# Patient Record
Sex: Female | Born: 1985 | Race: White | Hispanic: No | Smoking: Current every day smoker
Health system: Southern US, Community
[De-identification: ages and names within clinical notes are randomized; demographics above are authoritative.]

## PROBLEM LIST (undated history)

## (undated) DIAGNOSIS — I10 Essential (primary) hypertension: Secondary | ICD-10-CM

## (undated) DIAGNOSIS — J45909 Unspecified asthma, uncomplicated: Secondary | ICD-10-CM

## (undated) DIAGNOSIS — E669 Obesity, unspecified: Secondary | ICD-10-CM

## (undated) DIAGNOSIS — G8929 Other chronic pain: Secondary | ICD-10-CM

## (undated) DIAGNOSIS — N809 Endometriosis, unspecified: Secondary | ICD-10-CM

## (undated) DIAGNOSIS — E1122 Type 2 diabetes mellitus with diabetic chronic kidney disease: Secondary | ICD-10-CM

## (undated) DIAGNOSIS — N83209 Unspecified ovarian cyst, unspecified side: Secondary | ICD-10-CM

## (undated) DIAGNOSIS — F39 Unspecified mood [affective] disorder: Secondary | ICD-10-CM

## (undated) DIAGNOSIS — E1169 Type 2 diabetes mellitus with other specified complication: Secondary | ICD-10-CM

## (undated) DIAGNOSIS — M1990 Unspecified osteoarthritis, unspecified site: Secondary | ICD-10-CM

## (undated) DIAGNOSIS — F419 Anxiety disorder, unspecified: Secondary | ICD-10-CM

## (undated) DIAGNOSIS — R51 Headache: Secondary | ICD-10-CM

## (undated) DIAGNOSIS — F32A Depression, unspecified: Secondary | ICD-10-CM

## (undated) DIAGNOSIS — E785 Hyperlipidemia, unspecified: Secondary | ICD-10-CM

## (undated) DIAGNOSIS — R109 Unspecified abdominal pain: Secondary | ICD-10-CM

## (undated) DIAGNOSIS — F329 Major depressive disorder, single episode, unspecified: Secondary | ICD-10-CM

## (undated) DIAGNOSIS — R519 Headache, unspecified: Secondary | ICD-10-CM

## (undated) DIAGNOSIS — Z720 Tobacco use: Secondary | ICD-10-CM

## (undated) DIAGNOSIS — D497 Neoplasm of unspecified behavior of endocrine glands and other parts of nervous system: Secondary | ICD-10-CM

## (undated) HISTORY — DX: Type 2 diabetes mellitus with other specified complication: E11.69

## (undated) HISTORY — DX: Unspecified asthma, uncomplicated: J45.909

## (undated) HISTORY — PX: HERNIA REPAIR: SHX51

## (undated) HISTORY — DX: Obesity, unspecified: E66.9

## (undated) HISTORY — DX: Unspecified osteoarthritis, unspecified site: M19.90

## (undated) HISTORY — PX: TUBAL LIGATION: SHX77

## (undated) HISTORY — PX: KNEE ARTHROSCOPY: SUR90

## (undated) HISTORY — DX: Essential (primary) hypertension: I10

## (undated) HISTORY — DX: Hyperlipidemia, unspecified: E78.5

---
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
## 2012-02-22 ENCOUNTER — Emergency Department (HOSPITAL_COMMUNITY)
Admission: EM | Admit: 2012-02-22 | Discharge: 2012-02-23 | Disposition: A | Discharge status: Home / Self Care | Payer: Self-pay | Attending: Emergency Medicine | Admitting: Emergency Medicine

## 2012-02-22 ENCOUNTER — Encounter (HOSPITAL_COMMUNITY): Payer: Self-pay | Admitting: Emergency Medicine

## 2012-02-22 DIAGNOSIS — Z9889 Other specified postprocedural states: Secondary | ICD-10-CM | POA: Insufficient documentation

## 2012-02-22 DIAGNOSIS — R10819 Abdominal tenderness, unspecified site: Secondary | ICD-10-CM | POA: Insufficient documentation

## 2012-02-22 DIAGNOSIS — R109 Unspecified abdominal pain: Secondary | ICD-10-CM | POA: Insufficient documentation

## 2012-02-22 NOTE — ED Notes (Signed)
Patient states she had a "clamp tubal done on both fallopian tubes and I'm not supposed to lift anything heavy. I was doing heavy lifting today and felt a pop in my lower right stomach." Complaining of severe lower right abdominal pain starting today.

## 2012-02-22 NOTE — ED Provider Notes (Signed)
History   This chart was scribed for Shelda Jakes, MD scribed by Magnus Sinning. The patient was seen in room APA19/APA19 seen at 23:46    CSN: [id number]  Arrival date & time 02/22/12  2239   First MD Initiated Contact with Patient 02/22/12 2322      Chief Complaint  Patient presents with  . Abdominal Pain    (Consider location/radiation/quality/duration/timing/severity/associated sxs/prior treatment) HPI Patricia Stanton is a 26 y.o. female who presents to the Emergency Department complaining of sharp non radiating RLQ pain onset today. Pt says she was not supposed to do any heavy lifting because of h/o internal (inside fallopian tube) hernia. She says today she was doing heavy lifting  around noon today when she heard a "pop." States she has had pain in same spot chronically, but never to this severity, which she describes as a 10/10 on a pain scale. Hx of tubal ligation done in 2010 PCP: None Ob-Gyn: Dr. Ralph Dowdy at [facility] History reviewed. No pertinent past medical history.  Past Surgical History  Procedure Date  . Femur fracture surgery     History reviewed. No pertinent family history.  History  Substance Use Topics  . Smoking status: Current Everyday Smoker -- 0.5 packs/day  . Smokeless tobacco: Not on file  . Alcohol Use: No    Review of Systems  Constitutional: Negative for fever.  HENT: Negative for congestion and sore throat.   Eyes: Negative for visual disturbance.  Respiratory: Negative for cough and shortness of breath.   Cardiovascular: Negative for chest pain.  Gastrointestinal: Negative for nausea, vomiting and diarrhea.  Genitourinary: Negative for dysuria.  Skin: Negative for rash.  Neurological: Negative for headaches.  All other systems reviewed and are negative.    Allergies  Review of patient's allergies indicates no known allergies.  Home Medications   Current Outpatient Rx  Name Route Sig Dispense Refill  . HYDROCODONE-ACETAMINOPHEN  5-325 MG PO TABS Oral Take 1-2 tablets by mouth every 6 (six) hours as needed for pain. 14 tablet 0  . NAPROXEN 500 MG PO TABS Oral Take 1 tablet (500 mg total) by mouth 2 (two) times daily. 14 tablet 0    BP 131/77  Pulse 100  Temp(Src) 98 F (36.7 C) (Oral)  Resp 14  Ht 5\' 5"  (1.651 m)  Wt 219 lb (99.338 kg)  BMI 36.44 kg/m2  SpO2 99%  LMP 12/23/2011  Physical Exam  Nursing note and vitals reviewed. Constitutional: She is oriented to person, place, and time. She appears well-developed and well-nourished. No distress.  HENT:  Head: Normocephalic and atraumatic.  Mouth/Throat: Oropharynx is clear and moist.  Eyes: EOM are normal. Pupils are equal, round, and reactive to light.  Neck: Neck supple. No tracheal deviation present.  Cardiovascular: Normal rate and regular rhythm.   No murmur heard. Pulmonary/Chest: Effort normal. No respiratory distress.       Lungs clear  Abdominal: Soft. She exhibits no distension. There is tenderness.       Tender in RLQ and LLQ  Musculoskeletal: Normal range of motion. She exhibits no edema.  Neurological: She is alert and oriented to person, place, and time. No sensory deficit.  Skin: Skin is warm and dry.  Psychiatric: She has a normal mood and affect. Her behavior is normal.    ED Course  Procedures (including critical care time) DIAGNOSTIC STUDIES: Oxygen Saturation is 99% on room air, normal by my interpretation.    COORDINATION OF CARE:  Labs Reviewed  CBC -  Abnormal; Notable for the following:    WBC 11.7 (*)    All other components within normal limits  COMPREHENSIVE METABOLIC PANEL - Abnormal; Notable for the following:    Sodium 133 (*)    Total Bilirubin 0.2 (*)    All other components within normal limits  URINALYSIS, ROUTINE W REFLEX MICROSCOPIC - Abnormal; Notable for the following:    Specific Gravity, Urine >1.030 (*)    All other components within normal limits  DIFFERENTIAL  LIPASE, BLOOD   Ct Abdomen Pelvis W  Contrast  02/23/2012  *RADIOLOGY REPORT*  Clinical Data: Right lower quadrant abdominal pain  CT ABDOMEN AND PELVIS WITH CONTRAST  Technique:  Multidetector CT imaging of the abdomen and pelvis was performed following the standard protocol during bolus administration of intravenous contrast.  Contrast: OMNIPAQUE IOHEXOL 300 MG/ML  SOLN  Comparison: None.  Findings: 3 mm right lower lobe nodule on image two.  Lung bases are otherwise clear.  Heart size within normal limits.  No pleural or pericardial effusion.  Unremarkable liver, biliary system, spleen, pancreas, adrenal glands.  Symmetric renal enhancement.  No hydronephrosis or hydroureter.  No bowel obstruction.  No CT evidence for colitis.  Normal appendix.  No free intraperitoneal air or fluid.  No lymphadenopathy.  Normal caliber vasculature.  Decompressed bladder limits evaluation.  Unremarkable CT appearance to the uterus and adnexa. Tubal ligation clips noted.  Multilevel degenerative changes.  No acute osseous finding.  Within the anterior subcutaneous fat of the right lower quadrant/ groin, there is a 1.9 x 2.3 cm soft tissue versus complex fluid attenuation.  The collection/mass is in close proximity to a superficial vein (series 2 image 80).  IMPRESSION: No acute intra-abdominal process.  Normal appendix.  1.9 x 2.3 cm complex fluid collection versus soft tissue density within the anterior subcutaneous fat overlying the right lower quadrant/right groin.  This is a nonspecific finding with a differential that includes endometrial implant/endometriosis if this is along the course of prior surgery, scar tissue, sequelae of prior hematoma, or soft tissue tumor (though felt to be less likely). If palpable, an ultrasound could be considered to attempt to better characterize.  Alternatively, consider MRI follow-up.  Original Report Authenticated By: Waneta Martins, M.D.   Results for orders placed during the hospital encounter of 02/22/12  CBC       Component Value Range   WBC 11.7 (*) 4.0 - 10.5 (K/uL)   RBC 4.92  3.87 - 5.11 (MIL/uL)   Hemoglobin 13.4  12.0 - 15.0 (g/dL)   HCT 08.6  57.8 - 46.9 (%)   MCV 80.5  78.0 - 100.0 (fL)   MCH 27.2  26.0 - 34.0 (pg)   MCHC 33.8  30.0 - 36.0 (g/dL)   RDW 62.9  52.8 - 41.3 (%)   Platelets 269  150 - 400 (K/uL)  DIFFERENTIAL      Component Value Range   Neutrophils Relative 61  43 - 77 (%)   Neutro Abs 7.2  1.7 - 7.7 (K/uL)   Lymphocytes Relative 29  12 - 46 (%)   Lymphs Abs 3.4  0.7 - 4.0 (K/uL)   Monocytes Relative 8  3 - 12 (%)   Monocytes Absolute 1.0  0.1 - 1.0 (K/uL)   Eosinophils Relative 1  0 - 5 (%)   Eosinophils Absolute 0.1  0.0 - 0.7 (K/uL)   Basophils Relative 0  0 - 1 (%)   Basophils Absolute 0.0  0.0 - 0.1 (K/uL)  COMPREHENSIVE METABOLIC PANEL      Component Value Range   Sodium 133 (*) 135 - 145 (mEq/L)   Potassium 4.2  3.5 - 5.1 (mEq/L)   Chloride 100  96 - 112 (mEq/L)   CO2 22  19 - 32 (mEq/L)   Glucose, Bld 88  70 - 99 (mg/dL)   BUN 9  6 - 23 (mg/dL)   Creatinine, Ser 1.61  0.50 - 1.10 (mg/dL)   Calcium 9.6  8.4 - 09.6 (mg/dL)   Total Protein 7.6  6.0 - 8.3 (g/dL)   Albumin 3.6  3.5 - 5.2 (g/dL)   AST 22  0 - 37 (U/L)   ALT 27  0 - 35 (U/L)   Alkaline Phosphatase 83  39 - 117 (U/L)   Total Bilirubin 0.2 (*) 0.3 - 1.2 (mg/dL)   GFR calc non Af Amer >90  >90 (mL/min)   GFR calc Af Amer >90  >90 (mL/min)  LIPASE, BLOOD      Component Value Range   Lipase 48  11 - 59 (U/L)  URINALYSIS, ROUTINE W REFLEX MICROSCOPIC      Component Value Range   Color, Urine YELLOW  YELLOW    APPearance CLEAR  CLEAR    Specific Gravity, Urine >1.030 (*) 1.005 - 1.030    pH 6.0  5.0 - 8.0    Glucose, UA NEGATIVE  NEGATIVE (mg/dL)   Hgb urine dipstick NEGATIVE  NEGATIVE    Bilirubin Urine NEGATIVE  NEGATIVE    Ketones, ur NEGATIVE  NEGATIVE (mg/dL)   Protein, ur NEGATIVE  NEGATIVE (mg/dL)   Urobilinogen, UA 0.2  0.0 - 1.0 (mg/dL)   Nitrite NEGATIVE  NEGATIVE     Leukocytes, UA NEGATIVE  NEGATIVE      1. Abdominal pain       MDM  CT consistent with right lower quadrant pelvic area soft tissue mass this may be of the internal hernia talking about no evidence of bowel stuck in her bowel obstruction also a little bit of fluid collection there could be endometriosis recommend pelvic ultrasound patient prefers to have that done with her GYN doctor which will be done later this week patient will return for any new or worse symptoms. Patient once again did not want to have the pelvic ultrasound done here or scheduled here for later today. Patient's abdomen is not consistent with acute surgical abdomen patient is nontoxic in no acute distress.  I personally performed the services described in this documentation, which was scribed in my presence. The recorded information has been reviewed and considered.          Shelda Jakes, MD 02/23/12 [phone]

## 2012-02-22 NOTE — ED Notes (Signed)
Pt reporting pain in RLQ.  States that she knows it's her fallopian tube.  Pt reports having tubal ligation in 2010 and following surgery developed a hernia in her fallopian tube and severe pain.  Reports that she is feeling same symptoms now.

## 2012-02-23 ENCOUNTER — Emergency Department (HOSPITAL_COMMUNITY): Payer: Self-pay

## 2012-02-23 LAB — COMPREHENSIVE METABOLIC PANEL
ALT: 27 U/L (ref 0–35)
Albumin: 3.6 g/dL (ref 3.5–5.2)
Alkaline Phosphatase: 83 U/L (ref 39–117)
Potassium: 4.2 mEq/L (ref 3.5–5.1)
Sodium: 133 mEq/L — ABNORMAL LOW (ref 135–145)
Total Protein: 7.6 g/dL (ref 6.0–8.3)

## 2012-02-23 LAB — URINALYSIS, ROUTINE W REFLEX MICROSCOPIC
Bilirubin Urine: NEGATIVE
Hgb urine dipstick: NEGATIVE
Nitrite: NEGATIVE
Specific Gravity, Urine: >1.03 — ABNORMAL HIGH (ref 1.005–1.030)
pH: 6 (ref 5.0–8.0)

## 2012-02-23 LAB — CBC
MCH: 27.2 pg (ref 26.0–34.0)
MCHC: 33.8 g/dL (ref 30.0–36.0)
Platelets: 269 10*3/uL (ref 150–400)
RBC: 4.92 MIL/uL (ref 3.87–5.11)
RDW: 14.8 % (ref 11.5–15.5)

## 2012-02-23 LAB — DIFFERENTIAL
Basophils Absolute: 0 10*3/uL (ref 0.0–0.1)
Basophils Relative: 0 % (ref 0–1)
Eosinophils Absolute: 0.1 10*3/uL (ref 0.0–0.7)
Lymphs Abs: 3.4 10*3/uL (ref 0.7–4.0)
Neutrophils Relative %: 61 % (ref 43–77)

## 2012-02-23 MED ORDER — ACETAMINOPHEN 500 MG PO TABS
1000.0000 mg | ORAL_TABLET | Freq: Once | ORAL | Status: AC
  Administered 2012-02-23: 1000 mg via ORAL

## 2012-02-23 MED ORDER — HYDROMORPHONE HCL PF 1 MG/ML IJ SOLN: 1.0000 mg | Freq: Once | INTRAMUSCULAR | Status: DC

## 2012-02-23 MED ORDER — SODIUM CHLORIDE 0.9 % IV BOLUS (SEPSIS)
250.0000 mL | Freq: Once | INTRAVENOUS | Status: AC
  Administered 2012-02-23: 250 mL via INTRAVENOUS

## 2012-02-23 MED ORDER — ACETAMINOPHEN 500 MG PO TABS
ORAL_TABLET | ORAL | Status: AC
  Filled 2012-02-23: qty 2

## 2012-02-23 MED ORDER — NAPROXEN 500 MG PO TABS: 500.0000 mg | ORAL_TABLET | Freq: Two times a day (BID) | ORAL | Status: AC

## 2012-02-23 MED ORDER — SODIUM CHLORIDE 0.9 % IV SOLN: INTRAVENOUS | Status: DC

## 2012-02-23 MED ORDER — IOHEXOL 300 MG/ML  SOLN
100.0000 mL | Freq: Once | INTRAMUSCULAR | Status: AC | PRN
  Administered 2012-02-23: 100 mL via INTRAVENOUS

## 2012-02-23 MED ORDER — HYDROCODONE-ACETAMINOPHEN 5-325 MG PO TABS: 1.0000 | ORAL_TABLET | Freq: Four times a day (QID) | ORAL | Status: AC | PRN

## 2012-02-23 MED ORDER — ONDANSETRON HCL 4 MG/2ML IJ SOLN
4.0000 mg | Freq: Once | INTRAMUSCULAR | Status: DC
  Filled 2012-02-23: qty 2

## 2012-02-23 NOTE — ED Notes (Signed)
Pt did not to take narcotic pain medication at this time.  Pt also declined zofran at present.

## 2012-02-23 NOTE — ED Notes (Signed)
Patient transported to CT 

## 2012-02-23 NOTE — Discharge Instructions (Signed)
followup with your GYN Dr. In the next few days. Take pain medicine as directed. We are recommending a pelvic ultrasound to evaluate the fluid and soft tissue mass in the right lower corner of your abdomen pelvic area better we could arrange that for you later today but your preferences to followup with her GYN doctor which is fine. Return for new or worse symptoms.

## 2012-04-03 ENCOUNTER — Encounter (HOSPITAL_COMMUNITY): Payer: Self-pay

## 2012-04-03 ENCOUNTER — Emergency Department (HOSPITAL_COMMUNITY)
Admission: EM | Admit: 2012-04-03 | Discharge: 2012-04-04 | Disposition: A | Discharge status: Home / Self Care | Payer: Self-pay | Attending: Emergency Medicine | Admitting: Emergency Medicine

## 2012-04-03 DIAGNOSIS — G43909 Migraine, unspecified, not intractable, without status migrainosus: Secondary | ICD-10-CM | POA: Insufficient documentation

## 2012-04-03 DIAGNOSIS — F3289 Other specified depressive episodes: Secondary | ICD-10-CM | POA: Insufficient documentation

## 2012-04-03 DIAGNOSIS — F17200 Nicotine dependence, unspecified, uncomplicated: Secondary | ICD-10-CM | POA: Insufficient documentation

## 2012-04-03 DIAGNOSIS — F329 Major depressive disorder, single episode, unspecified: Secondary | ICD-10-CM | POA: Insufficient documentation

## 2012-04-03 DIAGNOSIS — R51 Headache: Secondary | ICD-10-CM

## 2012-04-03 HISTORY — DX: Depression, unspecified: F32.A

## 2012-04-03 HISTORY — DX: Major depressive disorder, single episode, unspecified: F32.9

## 2012-04-03 MED ORDER — ONDANSETRON 4 MG PO TBDP
4.0000 mg | ORAL_TABLET | Freq: Once | ORAL | Status: AC
  Administered 2012-04-03: 4 mg via ORAL
  Filled 2012-04-03: qty 1

## 2012-04-03 MED ORDER — HYDROMORPHONE HCL PF 1 MG/ML IJ SOLN
1.0000 mg | Freq: Once | INTRAMUSCULAR | Status: AC
  Administered 2012-04-03: 1 mg via INTRAMUSCULAR
  Filled 2012-04-03: qty 1

## 2012-04-03 MED ORDER — DIPHENHYDRAMINE HCL 25 MG PO CAPS
25.0000 mg | ORAL_CAPSULE | Freq: Once | ORAL | Status: AC
  Administered 2012-04-03: 25 mg via ORAL
  Filled 2012-04-03: qty 1

## 2012-04-03 MED ORDER — KETOROLAC TROMETHAMINE 30 MG/ML IJ SOLN
60.0000 mg | Freq: Once | INTRAMUSCULAR | Status: AC
  Administered 2012-04-03: 60 mg via INTRAMUSCULAR
  Filled 2012-04-03: qty 2

## 2012-04-03 NOTE — ED Provider Notes (Signed)
History   This chart was scribed for [employer]. Colon Branch, MD by Shari Heritage. The patient was seen in room APA11/APA11. Patient's care was started at 2258.     CSN: [id number]  Arrival date & time 04/03/12  2258   None     Chief Complaint  Patient presents with  . Headache    (Consider location/radiation/quality/duration/timing/severity/associated sxs/prior treatment) HPI Patricia Stanton is a 26 y.o. female who presents to the Emergency Department complaining of persistent, moderate to severe frontal HA onset 3 days ago with associated nausea, photophobia and visual disturbance including squiggles and spots. Patient denies vomiting. Patient has taken Excedrin Migraine, BC Migraine, Tylenol, and Motrin with no relief. Patient transported herself to the ED. Patient with h/o depression, femur fracture surgery and knee arthroscopy. Patient is a current everyday smoker (0.5 packs/day).  Past Medical History  Diagnosis Date  . Depression     Past Surgical History  Procedure Date  . Femur fracture surgery   . Knee arthroscopy     No family history on file.  History  Substance Use Topics  . Smoking status: Current Everyday Smoker -- 0.5 packs/day  . Smokeless tobacco: Not on file  . Alcohol Use: No    OB History    Grav Para Term Preterm Abortions TAB SAB Ect Mult Living                  Review of Systems  Constitutional: Negative for fever.       10 Systems reviewed and are negative for acute change except as noted in the HPI.  HENT: Negative for congestion.   Eyes: Positive for photophobia and visual disturbance. Negative for discharge and redness.  Respiratory: Negative for cough and shortness of breath.   Cardiovascular: Negative for chest pain.  Gastrointestinal: Positive for nausea. Negative for vomiting and abdominal pain.  Musculoskeletal: Negative for back pain.  Skin: Negative for rash.  Neurological: Positive for headaches. Negative for syncope and numbness.    Psychiatric/Behavioral:       No behavior change.  All other systems reviewed and are negative.    Allergies  Review of patient's allergies indicates no known allergies.  Home Medications   Current Outpatient Rx  Name Route Sig Dispense Refill  . ACETAMINOPHEN 325 MG PO TABS Oral Take 650 mg by mouth every 6 (six) hours as needed.    . ASPIRIN-ACETAMINOPHEN-CAFFEINE 250-250-65 MG PO TABS Oral Take 1 tablet by mouth every 6 (six) hours as needed.    Marland Kitchen CITALOPRAM HYDROBROMIDE 20 MG PO TABS Oral Take 20 mg by mouth daily.    . IBUPROFEN 400 MG PO TABS Oral Take 400 mg by mouth every 6 (six) hours as needed.    Marland Kitchen NAPROXEN 500 MG PO TABS Oral Take 1 tablet (500 mg total) by mouth 2 (two) times daily. 14 tablet 0    BP 142/90  Pulse 90  Temp 98.4 F (36.9 C) (Oral)  Resp 18  Ht 5\' 5"  (1.651 m)  Wt 220 lb (99.791 kg)  BMI 36.61 kg/m2  SpO2 97%  LMP 03/27/2012  Physical Exam  Constitutional: She is oriented to person, place, and time. No distress.  HENT:  Head: Normocephalic.  Eyes: Conjunctivae are normal. Pupils are equal, round, and reactive to light.  Cardiovascular: Normal rate and regular rhythm.   Pulmonary/Chest: Effort normal and breath sounds normal.  Abdominal: Soft. Bowel sounds are normal. There is no tenderness.  Musculoskeletal: Normal range of motion.  Neurological: She  is alert and oriented to person, place, and time. She has normal reflexes. Coordination normal.    ED Course  Procedures (including critical care time) DIAGNOSTIC STUDIES: Oxygen Saturation is 97% on room air, normal by my interpretation.    COORDINATION OF CARE: 11:13PM- Patient informed of current plan for treatment and evaluation and agrees with plan at this time.        MDM  Patient presents with migraine headache similar to previous headaches that she has had for 3 days not responding to OTC medicines. Given analgesic, benadryl, zofran, toradol with relief.  Pt feels improved  after observation and/or treatment in ED.Pt stable in ED with no significant deterioration in condition.The patient appears reasonably screened and/or stabilized for discharge and I doubt any other medical condition or other [facility] requiring further screening, evaluation, or treatment in the ED at this time prior to discharge.  I personally performed the services described in this documentation, which was scribed in my presence. The recorded information has been reviewed and considered.   MDM Reviewed: nursing note and vitals           Nicoletta Dress. Colon Branch, MD 04/04/12 4098

## 2012-04-03 NOTE — ED Notes (Signed)
Pt reports frontal headache x 3 days, taking otc meds without relief.

## 2012-04-04 MED ORDER — HYDROCODONE-ACETAMINOPHEN 5-325 MG PO TABS: 1.0000 | ORAL_TABLET | ORAL | Status: AC | PRN

## 2012-04-04 NOTE — ED Notes (Signed)
Discharge instructions reviewed with pt, questions answered. Pt verbalized understanding.  

## 2013-06-06 ENCOUNTER — Encounter (HOSPITAL_COMMUNITY): Payer: Self-pay | Admitting: Emergency Medicine

## 2013-06-06 ENCOUNTER — Emergency Department (HOSPITAL_COMMUNITY): Payer: PRIVATE HEALTH INSURANCE

## 2013-06-06 ENCOUNTER — Encounter (HOSPITAL_COMMUNITY): Payer: Self-pay | Admitting: *Deleted

## 2013-06-06 ENCOUNTER — Emergency Department (HOSPITAL_COMMUNITY)
Admission: EM | Admit: 2013-06-06 | Discharge: 2013-06-06 | Disposition: A | Discharge status: Home / Self Care | Payer: PRIVATE HEALTH INSURANCE | Attending: Emergency Medicine | Admitting: Emergency Medicine

## 2013-06-06 ENCOUNTER — Inpatient Hospital Stay (HOSPITAL_COMMUNITY)
Admission: EM | Admit: 2013-06-06 | Discharge: 2013-06-08 | DRG: 193 | Disposition: A | Discharge status: Home / Self Care | Payer: PRIVATE HEALTH INSURANCE | Attending: Internal Medicine | Admitting: Internal Medicine

## 2013-06-06 DIAGNOSIS — Z8659 Personal history of other mental and behavioral disorders: Secondary | ICD-10-CM | POA: Insufficient documentation

## 2013-06-06 DIAGNOSIS — E860 Dehydration: Secondary | ICD-10-CM | POA: Diagnosis present

## 2013-06-06 DIAGNOSIS — R0902 Hypoxemia: Secondary | ICD-10-CM

## 2013-06-06 DIAGNOSIS — Z792 Long term (current) use of antibiotics: Secondary | ICD-10-CM | POA: Insufficient documentation

## 2013-06-06 DIAGNOSIS — Z720 Tobacco use: Secondary | ICD-10-CM | POA: Diagnosis present

## 2013-06-06 DIAGNOSIS — J189 Pneumonia, unspecified organism: Secondary | ICD-10-CM

## 2013-06-06 DIAGNOSIS — R7309 Other abnormal glucose: Secondary | ICD-10-CM | POA: Diagnosis present

## 2013-06-06 DIAGNOSIS — Z23 Encounter for immunization: Secondary | ICD-10-CM

## 2013-06-06 DIAGNOSIS — E872 Acidosis, unspecified: Secondary | ICD-10-CM | POA: Diagnosis present

## 2013-06-06 DIAGNOSIS — Z6841 Body Mass Index (BMI) 40.0 and over, adult: Secondary | ICD-10-CM

## 2013-06-06 DIAGNOSIS — F17200 Nicotine dependence, unspecified, uncomplicated: Secondary | ICD-10-CM | POA: Diagnosis present

## 2013-06-06 DIAGNOSIS — J209 Acute bronchitis, unspecified: Secondary | ICD-10-CM | POA: Diagnosis present

## 2013-06-06 DIAGNOSIS — J159 Unspecified bacterial pneumonia: Secondary | ICD-10-CM | POA: Insufficient documentation

## 2013-06-06 DIAGNOSIS — J9801 Acute bronchospasm: Secondary | ICD-10-CM

## 2013-06-06 DIAGNOSIS — T380X5A Adverse effect of glucocorticoids and synthetic analogues, initial encounter: Secondary | ICD-10-CM | POA: Diagnosis present

## 2013-06-06 DIAGNOSIS — R739 Hyperglycemia, unspecified: Secondary | ICD-10-CM | POA: Diagnosis present

## 2013-06-06 DIAGNOSIS — Z833 Family history of diabetes mellitus: Secondary | ICD-10-CM

## 2013-06-06 DIAGNOSIS — R112 Nausea with vomiting, unspecified: Secondary | ICD-10-CM | POA: Insufficient documentation

## 2013-06-06 DIAGNOSIS — Z79899 Other long term (current) drug therapy: Secondary | ICD-10-CM

## 2013-06-06 DIAGNOSIS — IMO0002 Reserved for concepts with insufficient information to code with codable children: Secondary | ICD-10-CM | POA: Insufficient documentation

## 2013-06-06 DIAGNOSIS — F411 Generalized anxiety disorder: Secondary | ICD-10-CM | POA: Diagnosis present

## 2013-06-06 DIAGNOSIS — J9600 Acute respiratory failure, unspecified whether with hypoxia or hypercapnia: Secondary | ICD-10-CM | POA: Diagnosis present

## 2013-06-06 DIAGNOSIS — J9601 Acute respiratory failure with hypoxia: Secondary | ICD-10-CM

## 2013-06-06 HISTORY — DX: Anxiety disorder, unspecified: F41.9

## 2013-06-06 HISTORY — DX: Obesity, unspecified: E66.9

## 2013-06-06 HISTORY — DX: Tobacco use: Z72.0

## 2013-06-06 LAB — CBC WITH DIFFERENTIAL/PLATELET
Basophils Absolute: 0 10*3/uL (ref 0.0–0.1)
Basophils Relative: 0 % (ref 0–1)
Eosinophils Relative: 0 % (ref 0–5)
HCT: 39.3 % (ref 36.0–46.0)
MCH: 26.6 pg (ref 26.0–34.0)
MCHC: 32.3 g/dL (ref 30.0–36.0)
MCV: 82.4 fL (ref 78.0–100.0)
Monocytes Absolute: 0.3 10*3/uL (ref 0.1–1.0)
Platelets: 243 10*3/uL (ref 150–400)
RDW: 13.5 % (ref 11.5–15.5)

## 2013-06-06 LAB — COMPREHENSIVE METABOLIC PANEL
AST: 15 U/L (ref 0–37)
Albumin: 3.5 g/dL (ref 3.5–5.2)
Alkaline Phosphatase: 69 U/L (ref 39–117)
Calcium: 9.4 mg/dL (ref 8.4–10.5)
Creatinine, Ser: 0.6 mg/dL (ref 0.50–1.10)
Total Protein: 7.4 g/dL (ref 6.0–8.3)

## 2013-06-06 MED ORDER — ALBUTEROL (5 MG/ML) CONTINUOUS INHALATION SOLN
10.0000 mg/h | INHALATION_SOLUTION | Freq: Once | RESPIRATORY_TRACT | Status: AC
  Administered 2013-06-06: 10 mg/h via RESPIRATORY_TRACT

## 2013-06-06 MED ORDER — ONDANSETRON HCL 4 MG PO TABS
4.0000 mg | ORAL_TABLET | Freq: Four times a day (QID) | ORAL | Status: DC | PRN
  Filled 2013-06-06: qty 1

## 2013-06-06 MED ORDER — VANCOMYCIN HCL IN DEXTROSE 1-5 GM/200ML-% IV SOLN
1000.0000 mg | Freq: Once | INTRAVENOUS | Status: AC
  Filled 2013-06-06: qty 200

## 2013-06-06 MED ORDER — ACETAMINOPHEN 650 MG RE SUPP: 650.0000 mg | Freq: Four times a day (QID) | RECTAL | Status: DC | PRN

## 2013-06-06 MED ORDER — FAMOTIDINE 20 MG PO TABS
20.0000 mg | ORAL_TABLET | Freq: Every day | ORAL | Status: DC
  Administered 2013-06-07 – 2013-06-08 (×2): 20 mg via ORAL
  Filled 2013-06-06 (×2): qty 1

## 2013-06-06 MED ORDER — LEVOFLOXACIN IN D5W 750 MG/150ML IV SOLN
750.0000 mg | INTRAVENOUS | Status: DC
  Administered 2013-06-07: 750 mg via INTRAVENOUS
  Filled 2013-06-06 (×2): qty 150

## 2013-06-06 MED ORDER — VANCOMYCIN HCL IN DEXTROSE 1-5 GM/200ML-% IV SOLN
INTRAVENOUS | Status: AC
  Filled 2013-06-06: qty 200

## 2013-06-06 MED ORDER — ALBUTEROL SULFATE (5 MG/ML) 0.5% IN NEBU
INHALATION_SOLUTION | RESPIRATORY_TRACT | Status: AC
  Filled 2013-06-06: qty 2

## 2013-06-06 MED ORDER — ALUM & MAG HYDROXIDE-SIMETH 200-200-20 MG/5ML PO SUSP: 30.0000 mL | Freq: Four times a day (QID) | ORAL | Status: DC | PRN

## 2013-06-06 MED ORDER — DEXAMETHASONE SODIUM PHOSPHATE 10 MG/ML IJ SOLN
10.0000 mg | Freq: Once | INTRAMUSCULAR | Status: AC
  Administered 2013-06-06: 10 mg via INTRAVENOUS
  Filled 2013-06-06: qty 1

## 2013-06-06 MED ORDER — OXYCODONE HCL 5 MG PO TABS: 5.0000 mg | ORAL_TABLET | ORAL | Status: DC | PRN

## 2013-06-06 MED ORDER — LEVOFLOXACIN IN D5W 750 MG/150ML IV SOLN
750.0000 mg | Freq: Once | INTRAVENOUS | Status: AC
  Administered 2013-06-06: 750 mg via INTRAVENOUS
  Filled 2013-06-06: qty 150

## 2013-06-06 MED ORDER — ALBUTEROL (5 MG/ML) CONTINUOUS INHALATION SOLN
10.0000 mg/h | INHALATION_SOLUTION | RESPIRATORY_TRACT | Status: DC
  Administered 2013-06-06: 10 mg/h via RESPIRATORY_TRACT

## 2013-06-06 MED ORDER — VANCOMYCIN HCL IN DEXTROSE 1-5 GM/200ML-% IV SOLN
1000.0000 mg | Freq: Once | INTRAVENOUS | Status: AC
  Administered 2013-06-06: 1000 mg via INTRAVENOUS
  Filled 2013-06-06: qty 200

## 2013-06-06 MED ORDER — BENZONATATE 100 MG PO CAPS: 100.0000 mg | ORAL_CAPSULE | Freq: Three times a day (TID) | ORAL | Status: DC

## 2013-06-06 MED ORDER — ENOXAPARIN SODIUM 40 MG/0.4ML ~~LOC~~ SOLN
40.0000 mg | SUBCUTANEOUS | Status: DC
  Administered 2013-06-06 – 2013-06-07 (×2): 40 mg via SUBCUTANEOUS
  Filled 2013-06-06 (×2): qty 0.4

## 2013-06-06 MED ORDER — BENZONATATE 100 MG PO CAPS
100.0000 mg | ORAL_CAPSULE | Freq: Three times a day (TID) | ORAL | Status: DC
  Administered 2013-06-06 – 2013-06-08 (×5): 100 mg via ORAL
  Filled 2013-06-06 (×5): qty 1

## 2013-06-06 MED ORDER — SODIUM CHLORIDE 0.9 % IV SOLN: INTRAVENOUS | Status: DC

## 2013-06-06 MED ORDER — ACETAMINOPHEN 325 MG PO TABS: 650.0000 mg | ORAL_TABLET | Freq: Four times a day (QID) | ORAL | Status: DC | PRN

## 2013-06-06 MED ORDER — ALBUTEROL SULFATE (5 MG/ML) 0.5% IN NEBU
INHALATION_SOLUTION | RESPIRATORY_TRACT | Status: AC
  Administered 2013-06-06: 10 mg/h via RESPIRATORY_TRACT
  Filled 2013-06-06: qty 2

## 2013-06-06 MED ORDER — SODIUM CHLORIDE 0.9 % IV BOLUS (SEPSIS): 1000.0000 mL | Freq: Once | INTRAVENOUS | Status: DC

## 2013-06-06 MED ORDER — HYDROCOD POLST-CHLORPHEN POLST 10-8 MG/5ML PO LQCR: 5.0000 mL | Freq: Two times a day (BID) | ORAL | Status: DC

## 2013-06-06 MED ORDER — SODIUM CHLORIDE 0.9 % IV BOLUS (SEPSIS)
2000.0000 mL | Freq: Once | INTRAVENOUS | Status: AC
  Administered 2013-06-06: 2000 mL via INTRAVENOUS

## 2013-06-06 MED ORDER — ALBUTEROL SULFATE (5 MG/ML) 0.5% IN NEBU
2.5000 mg | INHALATION_SOLUTION | RESPIRATORY_TRACT | Status: DC
  Administered 2013-06-06 – 2013-06-08 (×10): 2.5 mg via RESPIRATORY_TRACT
  Filled 2013-06-06 (×8): qty 0.5

## 2013-06-06 MED ORDER — ALBUTEROL SULFATE HFA 108 (90 BASE) MCG/ACT IN AERS: 1.0000 | INHALATION_SPRAY | Freq: Four times a day (QID) | RESPIRATORY_TRACT | Status: DC | PRN

## 2013-06-06 MED ORDER — IPRATROPIUM BROMIDE 0.02 % IN SOLN
0.5000 mg | RESPIRATORY_TRACT | Status: DC
  Administered 2013-06-06 – 2013-06-08 (×10): 0.5 mg via RESPIRATORY_TRACT
  Filled 2013-06-06 (×8): qty 2.5

## 2013-06-06 MED ORDER — NICOTINE 14 MG/24HR TD PT24
14.0000 mg | MEDICATED_PATCH | Freq: Every day | TRANSDERMAL | Status: DC
  Filled 2013-06-06 (×2): qty 1

## 2013-06-06 MED ORDER — LEVOFLOXACIN 500 MG PO TABS: 500.0000 mg | ORAL_TABLET | Freq: Every day | ORAL | Status: DC

## 2013-06-06 MED ORDER — ONDANSETRON HCL 4 MG/2ML IJ SOLN: 4.0000 mg | Freq: Four times a day (QID) | INTRAMUSCULAR | Status: DC | PRN

## 2013-06-06 MED ORDER — POTASSIUM CHLORIDE IN NACL 20-0.9 MEQ/L-% IV SOLN
INTRAVENOUS | Status: DC
  Administered 2013-06-06: 20:00:00 via INTRAVENOUS

## 2013-06-06 MED ORDER — VANCOMYCIN HCL 10 G IV SOLR
1250.0000 mg | Freq: Two times a day (BID) | INTRAVENOUS | Status: DC
  Administered 2013-06-07: 1250 mg via INTRAVENOUS
  Filled 2013-06-06 (×5): qty 1250

## 2013-06-06 MED ORDER — HYDROCOD POLST-CHLORPHEN POLST 10-8 MG/5ML PO LQCR: 5.0000 mL | Freq: Two times a day (BID) | ORAL | Status: DC | PRN

## 2013-06-06 MED ORDER — PREDNISONE 10 MG PO TABS: 20.0000 mg | ORAL_TABLET | Freq: Every day | ORAL | Status: DC

## 2013-06-06 MED ORDER — ONDANSETRON 4 MG PO TBDP
4.0000 mg | ORAL_TABLET | Freq: Once | ORAL | Status: AC
  Administered 2013-06-06: 4 mg via ORAL
  Filled 2013-06-06: qty 1

## 2013-06-06 NOTE — ED Notes (Signed)
Pt works at [employer], started having itching throat last Tuesday. Continue to get worse on wed temp 102, checked into ED with sob, cough, non productive. Was given albuterol and Zithromax. Pt states she is not getting any better, upper chest, rib pain from cough.

## 2013-06-06 NOTE — H&P (Signed)
[facility] History and Physical  Yazmina Pareja WUJ:[id number] DOB: Apr 24, 1986 DOA: 06/06/2013  Referring physician: ED physician, Dr. Fonnie Jarvis PCP: No PCP Per Patient  Specialists:   Chief Complaint: Shortness of breath  HPI: Patricia Stanton is a 27 y.o. female with no significant past medical history, who presents to the emergency department today with a chief complaint of shortness of breath. She actually presented to the emergency department this morning for the same symptoms. She was treated symptomatically and discharged from the ED on a prednisone taper, Levaquin, albuterol inhaler, and cough suppressants. She was also evaluated a few days ago at [facility] emergency department. She was treated for bronchitis and discharged from the ED on a Z-Pak, Tussionex, and albuterol inhaler. She came back to the emergency department because she continued to be short of breath and wheezing. Her symptoms of shortness of breath, chest congestion, fever, and chills started approximately 4 days ago. She has a cough with dark-colored sputum, but at other times, her sputum is clear. She has had ongoing wheezing and chest congestion. She had a fever of 102F at home. She has had a mildly sore throat, but no earache, or runny nose. 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 She has been exposed to sick contacts. She smokes approximately half a pack per day of cigarettes.  In the emergency department, she is oxygenating in the mid 80s on room air. With oxygen, it increases the mid 90s. Her temperature is 99.1. Her chest x-ray reveals low lung volumes and a possible mild left upper lobe opacity. Her lab data are significant for a glucose of 176 and her lactic acid level of 3.01. She is being admitted for further evaluation and management.    Review of Systems: Review of systems reviewed. Positive as above in history present illness. Otherwise review of systems is negative.  Past Medical History   Diagnosis Date  . Anxiety   . Obesity   . Tobacco abuse    Past Surgical History  Procedure Laterality Date  . Femur fracture surgery    . Knee arthroscopy    . Tubal ligation     Social History: She is divorced. She lives in [place]. She has one child. She is employed as a Insurance underwriter at [employer]. She smokes a half a pack of cigarettes per day. She denies alcohol and illicit drug use.    No Known Allergies  Family history: Her mother is 19 years of age and has hyperlipidemia. Her father is 48 years of age and has hyperlipidemia, hypertension, and diabetes.  Prior to Admission medications   Medication Sig Start Date End Date Taking? Authorizing Provider  albuterol (PROVENTIL HFA;VENTOLIN HFA) 108 (90 BASE) MCG/ACT inhaler Inhale 2 puffs into the lungs every 6 (six) hours as needed for wheezing.    Historical Provider, MD  albuterol (PROVENTIL HFA;VENTOLIN HFA) 108 (90 BASE) MCG/ACT inhaler Inhale 1-2 puffs into the lungs every 6 (six) hours as needed for wheezing. 06/06/13   [name], MD  benzonatate (TESSALON) 100 MG capsule Take 1 capsule (100 mg total) by mouth every 8 (eight) hours. 06/06/13   [name], MD  chlorpheniramine-HYDROcodone (TUSSIONEX PENNKINETIC ER) 10-8 MG/5ML LQCR Take 5 mLs by mouth every 12 (twelve) hours. 06/06/13   [name], MD  guaiFENesin (MUCINEX) 600 MG 12 hr tablet Take 1,200 mg by mouth 2 (two) times daily.    Historical Provider, MD  guaiFENesin-dextromethorphan (ROBITUSSIN DM) 100-10 MG/5ML  syrup Take 10 mLs by mouth 3 (three) times daily as needed for cough.    Historical Provider, MD  levofloxacin (LEVAQUIN) 500 MG tablet Take 1 tablet (500 mg total) by mouth daily. 06/06/13   [name], MD  predniSONE (DELTASONE) 10 MG tablet Take 2 tablets (20 mg total) by mouth daily. 06/06/13   [name], MD   Physical Exam: Temperature 99.1 pulse 104 respiratory rate 20 blood pressure 113/57   General:  Alert obese 27 year old  Caucasian woman laying in bed, in no acute distress.  Eyes: Pupils are equal, round, and reactive to light. Extraocular was are intact. Conjunctivae are clear. Sclerae are white.  ENT: Nasal mucosa is dry. No active rhinorrhea. Oropharynx reveals mildly dry mucous membranes. There is a pierced ring in her tongue. No posterior edema, erythema, or exudates  Neck: Supple, obese, no adenopathy or thyromegaly. No JVD.  Cardiovascular: S1, S2, with borderline tachycardia.  Respiratory: Several scattered wheezes and occasional crackles. Breathing is nonlabored.  Abdomen: Obese, positive bowel sounds, soft, nontender, nondistended.  Skin: Good turgor. No rashes.  Musculoskeletal: No acute hot red joints.  Psychiatric: Pleasant affect. She is alert and oriented x3. Her speech is clear.  Neurologic: Cranial nerves II through XII are intact. Strength is 5 over 5 throughout. Sensation is intact.  Labs on Admission:  Basic Metabolic Panel:  Recent Labs Lab 06/06/13 1657  NA 134*  K 3.9  CL 100  CO2 22  GLUCOSE 176*  BUN 8  CREATININE 0.60  CALCIUM 9.4   Liver Function Tests:  Recent Labs Lab 06/06/13 1657  AST 15  ALT 17  ALKPHOS 69  BILITOT 0.2*  PROT 7.4  ALBUMIN 3.5   No results found for this basename: LIPASE, AMYLASE,  in the last 168 hours No results found for this basename: AMMONIA,  in the last 168 hours CBC:  Recent Labs Lab 06/06/13 1657  WBC 8.4  NEUTROABS 7.3  HGB 12.7  HCT 39.3  MCV 82.4  PLT 243   Cardiac Enzymes: No results found for this basename: CKTOTAL, CKMB, CKMBINDEX, TROPONINI,  in the last 168 hours  BNP (last 3 results) No results found for this basename: PROBNP,  in the last 8760 hours CBG: No results found for this basename: GLUCAP,  in the last 168 hours  Radiological Exams on Admission: Dg Chest 2 View  06/06/2013   CLINICAL DATA:  Shortness of breath, cough  EXAM: CHEST  2 VIEW  COMPARISON:  None.  FINDINGS: Low lung volumes  with vascular crowding. No frank interstitial edema. Mild left upper lobe/ perihilar opacity is possible, pneumonia not excluded. No pleural effusion or pneumothorax.  The heart is top-normal in size for inspiration.  Mild degenerative changes of the thoracic spine.  IMPRESSION: Low lung volumes.  Mild left upper lobe/ perihilar opacity is possible, pneumonia not excluded.   Electronically Signed   By: Charline Bills M.D.   On: 06/06/2013 08:04    EKG: Not performed.  Assessment/Plan Principal Problem:   Acute bronchitis Active Problems:   Community acquired pneumonia   Acute respiratory failure with hypoxia   Tobacco abuse   Morbid obesity   Lactic acidosis   Hyperglycemia, drug-induced   1. Acute respiratory failure secondary to acute bronchitis and possibly early community-acquired pneumonia. Failed outpatient therapy with Z-Pak, albuterol inhaler, and supportive treatment prescribed at [facility]. The patient's symptomatology appears to be mostly bronchitic. She reports fever at home, but no obvious fever in the  emergency department. She does not appear to be in extremities. She is however borderline hypoxic which is likely related to the bronchitis and query early pneumonia. 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 2. Tobacco abuse. The patient was strongly advised to stop smoking. 3. Lactic acidosis. Etiology uncertain, but doubt sepsis. Will follow. 4. Hyperglycemia. She has no known history of diabetes. She did receive IV Decadron in the emergency department this morning which could be the source for hyperglycemia.Maryclare Labrador continue to monitor.     Plan: 1. The patient was given IV Levaquin in the emergency department. As will be continued. We'll add vancomycin empirically given that she is a Research scientist (physical sciences). 2. Will start albuterol and Atrovent nebulizers every 4 hours. 3. We'll hold off on further steroids until  reevaluation in the morning. She does have a few bronchospasms but they could be readily treated with bronchodilators only. 4. Symptomatic treatment with as needed Tussionex and Tessalon Perles. 5. Tobacco cessation counseling. We'll add a nicotine patch. 6. We'll continue IV fluid hydration. She received 2 L of fluids in the emergency department. 7. For further evaluation, we'll order a hemoglobin A1c, followup lactic acid level, and TSH.  Code Status: Full code Family Communication: Discussed with parents Disposition Plan: Anticipate discharge to home in a few days when medically improved.  Time spent: One hour  [facility] Pager [phone]  If 7PM-7AM, please contact night-coverage [email or web address] Password [facility] 06/06/2013, 6:33 PM

## 2013-06-06 NOTE — Progress Notes (Signed)
ANTIBIOTIC CONSULT NOTE - INITIAL  Pharmacy Consult for Vancomycin Indication: rule out pneumonia  No Known Allergies  Patient Measurements: Height: 5\' 5"  (165.1 cm) Weight: 284 lb 9.8 oz (129.1 kg) IBW/kg (Calculated) : 57  Vital Signs: Temp: 97.9 F (36.6 C) (09/21 1852) Temp src: Oral (09/21 1852) BP: 137/65 mmHg (09/21 1852) Pulse Rate: 95 (09/21 1852) Intake/Output from previous day:   Intake/Output from this shift:    Labs:  Recent Labs  06/06/13 1657  WBC 8.4  HGB 12.7  PLT 243  CREATININE 0.60   Estimated Creatinine Clearance: 141.8 ml/min (by C-G formula based on Cr of 0.6). No results found for this basename: VANCOTROUGH, VANCOPEAK, VANCORANDOM, GENTTROUGH, GENTPEAK, GENTRANDOM, TOBRATROUGH, TOBRAPEAK, TOBRARND, AMIKACINPEAK, AMIKACINTROU, AMIKACIN,  in the last 72 hours   Microbiology: No results found for this or any previous visit (from the past 720 hour(s)).  Medical History: Past Medical History  Diagnosis Date  . Anxiety   . Obesity   . Tobacco abuse     Medications:  Scheduled:  . albuterol  2.5 mg Nebulization Q4H  . benzonatate  100 mg Oral TID  . enoxaparin (LOVENOX) injection  40 mg Subcutaneous Q24H  . [START ON 06/07/2013] famotidine  20 mg Oral Daily  . ipratropium  0.5 mg Nebulization Q4H  . levofloxacin (LEVAQUIN) IV  750 mg Intravenous Q24H  . nicotine  14 mg Transdermal Daily   Assessment: 27 yo obese F who has worsening bronchitis/early PNA on Z-pak.  She works in ED at [employer].   She was febrile & hypoxic on arrival to ED.  Lactic acid level is elevated.  Renal function is at patient's baseline.   Goal of Therapy:  Vancomycin trough level 15-20 mcg/ml  Plan:  Vancomycin 2gm IV loading dose then 1250mg  IV q12h Check Vancomycin trough at steady state Monitor renal function and cx data   Elson Clan 06/06/2013,9:29 PM

## 2013-06-06 NOTE — ED Notes (Signed)
Pt returned to ED secondary to SOB, and heart palpitations. Pt placed on cardiac monitor, oxygen sats are 88. BBS are decreased in bilateral bases and rales audible throughout . Pt placed on 2 LPM [place]. PIV started without difficulty.

## 2013-06-06 NOTE — ED Notes (Signed)
Pt's room air 02 sat 88%.  Placed pt on 02 at 2liters, 02 sat increased to 91% and notified EDP.  EDP ordered hour long neb treatment.

## 2013-06-06 NOTE — ED Notes (Signed)
Pt c/o sob, palpations,  was seen in er earlier this am, diagnosed with pneumonia, states "I was going to be admitted until my dad opened his big mouth and told them that I had a neb machine at home", pt denies using the nebulizer since being discharged from er, did get prescriptions filled,

## 2013-06-06 NOTE — ED Notes (Signed)
Pt was 91% on room air, placed on 2L 02 via [place], pt states she has decreased appetite.

## 2013-06-06 NOTE — ED Provider Notes (Signed)
CSN: [id number]     Arrival date & time 06/06/13  1532 History  This chart was scribed for Hurman Horn, MD by Caryn Bee, ED Scribe. This patient was seen in room APA01/APA01 and the patient's care was started 4:34 PM.    Chief Complaint  Patient presents with  . Shortness of Breath    The history is provided by the patient. No language interpreter was used.   HPI Comments: Patricia Stanton is a 27 y.o. female with no h/o asthma presents to the Emergency Department complaining of SOB with associated cough, wheezing, and hypoxia (O2 stats in the mid 80s) over the past week. She also reports a fever of 102 over the past week. Her temperature today in the ED was 99.1. Pt was seen about 1 week ago in ED in [place] for the same and was diagnosed with pneumonia. She was discharged with azithromycin and an inhaler after that visit, which she has taken without relief of symptoms. She was discharged after that visit due to a transient improvement in her hypoxia. Pt was seen in ED earlier today for hypoxia (O2 stats in the mid 80s) and she she was given Levaquin and prednisone which have yet to offer relief. Pt denies diarrhea, emesis,confusion, LOC, or any other symptoms.    Past Medical History  Diagnosis Date  . Anxiety   . Obesity   . Tobacco abuse    Past Surgical History  Procedure Laterality Date  . Femur fracture surgery    . Knee arthroscopy    . Tubal ligation     No family history on file. History  Substance Use Topics  . Smoking status: Current Every Day Smoker -- 0.50 packs/day  . Smokeless tobacco: Not on file  . Alcohol Use: No   OB History   Grav Para Term Preterm Abortions TAB SAB Ect Mult Living                 Review of Systems 10 Systems reviewed and are negative for acute change except as noted in the HPI.   Allergies  Review of patient's allergies indicates no known allergies.  Home Medications   No current outpatient prescriptions on file. Triage  Vitals: SpO2 88%  LMP 05/14/2013  Physical Exam  Nursing note and vitals reviewed. Constitutional:  Awake, alert, nontoxic appearance.  HENT:  Head: Atraumatic.  Eyes: Right eye exhibits no discharge. Left eye exhibits no discharge.  Neck: Neck supple.  Cardiovascular:  No murmur heard. Mild tachycardia.  Pulmonary/Chest: Effort normal. She has wheezes. She exhibits no tenderness.  Scattered mild wheezes, crackles, and rhonchi.   Abdominal: Soft. Bowel sounds are normal. There is no tenderness. There is no rebound.  Musculoskeletal: She exhibits no edema and no tenderness.  Baseline ROM, no obvious new focal weakness.  Neurological:  Mental status and motor strength appears baseline for patient and situation.  Skin: No rash noted.  Psychiatric: She has a normal mood and affect.    ED Course  Procedures (including critical care time) DIAGNOSTIC STUDIES: Oxygen Saturation is 88% on [place], low by my interpretation.    COORDINATION OF CARE: 4:39 PM- Discussed admitting pt. Pt agrees. Will order IV antibiotics and fluids.  The patient appears reasonably stabilized for admission considering the current resources, flow, and capabilities available in the ED at this time, and I doubt any other [facility] requiring further screening and/or treatment in the ED prior to admission.  D/w [facility] for admit. 1755 Labs Review Labs  Reviewed  CBC WITH DIFFERENTIAL - Abnormal; Notable for the following:    Neutrophils Relative % 87 (*)    Lymphocytes Relative 10 (*)    All other components within normal limits  COMPREHENSIVE METABOLIC PANEL - Abnormal; Notable for the following:    Sodium 134 (*)    Glucose, Bld 176 (*)    Total Bilirubin 0.2 (*)    All other components within normal limits  HEMOGLOBIN A1C - Abnormal; Notable for the following:    Hemoglobin A1C 5.9 (*)    Mean Plasma Glucose 123 (*)    All other components within normal limits  BASIC METABOLIC PANEL - Abnormal; Notable for the  following:    Glucose, Bld 151 (*)    All other components within normal limits  CBC - Abnormal; Notable for the following:    WBC 11.6 (*)    All other components within normal limits  GLUCOSE, CAPILLARY - Abnormal; Notable for the following:    Glucose-Capillary 119 (*)    All other components within normal limits  GLUCOSE, CAPILLARY - Abnormal; Notable for the following:    Glucose-Capillary 175 (*)    All other components within normal limits  CG4 I-STAT (LACTIC ACID) - Abnormal; Notable for the following:    Lactic Acid, Venous 3.01 (*)    All other components within normal limits  TSH  LACTIC ACID, PLASMA   Imaging Review Dg Chest 2 View  06/06/2013   CLINICAL DATA:  Shortness of breath, cough  EXAM: CHEST  2 VIEW  COMPARISON:  None.  FINDINGS: Low lung volumes with vascular crowding. No frank interstitial edema. Mild left upper lobe/ perihilar opacity is possible, pneumonia not excluded. No pleural effusion or pneumothorax.  The heart is top-normal in size for inspiration.  Mild degenerative changes of the thoracic spine.  IMPRESSION: Low lung volumes.  Mild left upper lobe/ perihilar opacity is possible, pneumonia not excluded.   Electronically Signed   By: Charline Bills M.D.   On: 06/06/2013 08:04    MDM   1. CAP (community acquired pneumonia)   2. Hypoxia   3. Bronchospasm   4. Acute bronchitis   5. Acute respiratory failure with hypoxia   6. Lactic acidosis   7. Hyperglycemia, drug-induced    I personally performed the services described in this documentation, which was scribed in my presence. The recorded information has been reviewed and is accurate.   Hurman Horn, MD 06/07/13 [phone]

## 2013-06-06 NOTE — ED Notes (Signed)
Pt complains of being sob but refusing offer of wheelchair to be transported to the back, pt very short with her answers at triage, when asked what was happening that brought pt to the er pt states "I am back",

## 2013-06-06 NOTE — ED Provider Notes (Addendum)
CSN: [id number]     Arrival date & time 06/06/13  4098 History   This chart was scribed for Patricia Marion, MD, by Yevette Edwards, ED Scribe. This patient was seen in room APA01/APA01 and the patient's care was started at 7:09 AM. None    Chief Complaint  Patient presents with  . Shortness of Breath   (Consider location/radiation/quality/duration/timing/severity/associated sxs/prior Treatment) The history is provided by the patient. No language interpreter was used.   HPI Comments: Patricia Stanton is a 27 y.o. female who presents to the Emergency Department complaining of gradually-increasing SOB which began five days ago. The pt states that she works at [employer] and she believes that she was recently exposed to strep pharyngitis. When her symptoms of SOB, congestion, cough, post-tussive emesis, and nausea began four days ago, she treated the symptoms with airborn without any relief. The pt states that her SOB feels "as if something is sitting on her chest." She then developed a temperature of 102.1 four days ago, and she was treated at [facility] for the symptoms; she was prescribed Zithromax and an  albuterol inhaler, but she denies any relief from either. She also denies any relief from the OTC congestion medication she has tried. She reports that ambulation and deep inspiration increase her symptoms. She denies a sore throat, abdominal pain, myalgia, or swelling to her extremities. The pt denies any h/o asthma or pulmonary problems. At bedside, she denies smoking though her chart reflects that she is a daily smoker.   Past Medical History  Diagnosis Date  . Depression    Past Surgical History  Procedure Laterality Date  . Femur fracture surgery    . Knee arthroscopy    . Tubal ligation     History reviewed. No pertinent family history. History  Substance Use Topics  . Smoking status: Current Every Day Smoker -- 0.50 packs/day  . Smokeless tobacco: Not on file  . Alcohol  Use: No   No OB history provided.  Review of Systems  Constitutional: Positive for fever.  HENT: Positive for congestion. Negative for sore throat.   Respiratory: Positive for shortness of breath.   Cardiovascular: Negative for leg swelling.  Gastrointestinal: Positive for nausea and vomiting. Negative for abdominal pain.  Musculoskeletal: Negative for myalgias.    Allergies  Review of patient's allergies indicates no known allergies.  Home Medications   Current Outpatient Rx  Name  Route  Sig  Dispense  Refill  . albuterol (PROVENTIL HFA;VENTOLIN HFA) 108 (90 BASE) MCG/ACT inhaler   Inhalation   Inhale 2 puffs into the lungs every 6 (six) hours as needed for wheezing.         Marland Kitchen guaiFENesin (MUCINEX) 600 MG 12 hr tablet   Oral   Take 1,200 mg by mouth 2 (two) times daily.         Marland Kitchen guaiFENesin-dextromethorphan (ROBITUSSIN DM) 100-10 MG/5ML syrup   Oral   Take 10 mLs by mouth 3 (three) times daily as needed for cough.         Marland Kitchen albuterol (PROVENTIL HFA;VENTOLIN HFA) 108 (90 BASE) MCG/ACT inhaler   Inhalation   Inhale 1-2 puffs into the lungs every 6 (six) hours as needed for wheezing.   1 Inhaler   0   . benzonatate (TESSALON) 100 MG capsule   Oral   Take 1 capsule (100 mg total) by mouth every 8 (eight) hours.   21 capsule   0   . chlorpheniramine-HYDROcodone (TUSSIONEX PENNKINETIC ER) 10-8  MG/5ML LQCR   Oral   Take 5 mLs by mouth every 12 (twelve) hours.   60 mL   0   . levofloxacin (LEVAQUIN) 500 MG tablet   Oral   Take 1 tablet (500 mg total) by mouth daily.   10 tablet   0   . predniSONE (DELTASONE) 10 MG tablet   Oral   Take 2 tablets (20 mg total) by mouth daily.   10 tablet   0    Triage Vitals: BP 153/85  Pulse 97  Temp(Src) 99.1 F (37.3 C) (Oral)  Resp 26  Ht 5\' 5"  (1.651 m)  Wt 190 lb (86.183 kg)  BMI 31.62 kg/m2  SpO2 91%  LMP 05/14/2013  Physical Exam  Nursing note and vitals reviewed. Constitutional: She is oriented to  person, place, and time. She appears well-developed and well-nourished. No distress.  HENT:  Head: Normocephalic and atraumatic.  No exudate.  Slight erythema to oropharynx.   Eyes: EOM are normal.  Neck: Normal range of motion. Neck supple. No tracheal deviation present.  Slightly enlarged lymph nodes bilaterally.   Cardiovascular: Normal rate, regular rhythm and normal heart sounds.   Pulmonary/Chest: No respiratory distress. She has no wheezes.  Diminished wheezing and prolonged expiration throughout lung fields.   Abdominal: Soft. There is no tenderness.  Musculoskeletal: Normal range of motion. She exhibits no edema and no tenderness.  Neurological: She is alert and oriented to person, place, and time.  Skin: Skin is warm and dry.  Psychiatric: She has a normal mood and affect. Her behavior is normal.    ED Course  Procedures (including critical care time)  DIAGNOSTIC STUDIES: Oxygen Saturation is 91% on [place], hypoxic by my interpretation.    COORDINATION OF CARE:  7:20 AM- Discussed treatment plan with patient, and the patient agreed to the plan.   Labs Review Labs Reviewed - No data to display Imaging Review Dg Chest 2 View  06/06/2013   CLINICAL DATA:  Shortness of breath, cough  EXAM: CHEST  2 VIEW  COMPARISON:  None.  FINDINGS: Low lung volumes with vascular crowding. No frank interstitial edema. Mild left upper lobe/ perihilar opacity is possible, pneumonia not excluded. No pleural effusion or pneumothorax.  The heart is top-normal in size for inspiration.  Mild degenerative changes of the thoracic spine.  IMPRESSION: Low lung volumes.  Mild left upper lobe/ perihilar opacity is possible, pneumonia not excluded.   Electronically Signed   By: Charline Bills M.D.   On: 06/06/2013 08:04    MDM   1. Community acquired pneumonia    X-ray shows subtle left upper lobe infiltrate. After albuterol nebulizer she was feeling improved. She hasn't end expiratory wheeze but  overall much better air exchange. There continues to be well oxygenated at 99% on room air. Plan will be treatment for community-acquired pneumonia. 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 She is not around any patients more than the general public so I still consider this community-acquired. Plans Levaquin and Tessalon Tussionex) for followup if not improving.  Patient was reexamined prior to discharge. Her saturations had dropped into the 86-87% range. Was placed on oxygen. He was given additional albuterol treatment. Therefore the initial one hour. His saturations are 90-91% on room air. I discussed this with her at length. She does not have any increased worker breathing states he feels moderately improved versus her arrival here. Plan will be as above with outpatient followup with her physician or her work place and  oxygen checked tomorrow.  I personally performed the services described in this documentation, which was scribed in my presence. The recorded information has been reviewed and is accurate.     Patricia Marion, MD 06/06/13 1610  Patricia Marion, MD 06/06/13 [phone]

## 2013-06-07 DIAGNOSIS — T50904A Poisoning by unspecified drugs, medicaments and biological substances, undetermined, initial encounter: Secondary | ICD-10-CM

## 2013-06-07 DIAGNOSIS — R7309 Other abnormal glucose: Secondary | ICD-10-CM

## 2013-06-07 LAB — GLUCOSE, CAPILLARY
Glucose-Capillary: 119 mg/dL — ABNORMAL HIGH (ref 70–99)
Glucose-Capillary: 175 mg/dL — ABNORMAL HIGH (ref 70–99)
Glucose-Capillary: 147 mg/dL — ABNORMAL HIGH (ref 70–99)

## 2013-06-07 LAB — CBC
Hemoglobin: 12.7 g/dL (ref 12.0–15.0)
MCHC: 32.8 g/dL (ref 30.0–36.0)
MCV: 82.9 fL (ref 78.0–100.0)
Platelets: 241 10*3/uL (ref 150–400)
RBC: 4.67 MIL/uL (ref 3.87–5.11)
RDW: 13.8 % (ref 11.5–15.5)
WBC: 11.6 10*3/uL — ABNORMAL HIGH (ref 4.0–10.5)

## 2013-06-07 LAB — BASIC METABOLIC PANEL
CO2: 23 mEq/L (ref 19–32)
Calcium: 9.5 mg/dL (ref 8.4–10.5)
Chloride: 104 mEq/L (ref 96–112)
Creatinine, Ser: 0.5 mg/dL (ref 0.50–1.10)
GFR calc Af Amer: >90 mL/min (ref >90)
Glucose, Bld: 151 mg/dL — ABNORMAL HIGH (ref 70–99)
Sodium: 138 mEq/L (ref 135–145)

## 2013-06-07 LAB — HEMOGLOBIN A1C
Hgb A1c MFr Bld: 5.9 % — ABNORMAL HIGH (ref <5.7)
Mean Plasma Glucose: 123 mg/dL — ABNORMAL HIGH (ref <117)

## 2013-06-07 MED ORDER — INSULIN ASPART 100 UNIT/ML ~~LOC~~ SOLN: 0.0000 [IU] | Freq: Every day | SUBCUTANEOUS | Status: DC

## 2013-06-07 MED ORDER — INSULIN GLARGINE 100 UNIT/ML ~~LOC~~ SOLN
10.0000 [IU] | Freq: Every day | SUBCUTANEOUS | Status: DC
  Administered 2013-06-07: 10 [IU] via SUBCUTANEOUS
  Filled 2013-06-07 (×2): qty 0.1

## 2013-06-07 MED ORDER — METHYLPREDNISOLONE SODIUM SUCC 125 MG IJ SOLR
60.0000 mg | Freq: Three times a day (TID) | INTRAMUSCULAR | Status: DC
  Administered 2013-06-07 – 2013-06-08 (×4): 60 mg via INTRAVENOUS
  Filled 2013-06-07 (×4): qty 2

## 2013-06-07 MED ORDER — INSULIN ASPART 100 UNIT/ML ~~LOC~~ SOLN
0.0000 [IU] | Freq: Three times a day (TID) | SUBCUTANEOUS | Status: DC
  Administered 2013-06-07: 3 [IU] via SUBCUTANEOUS
  Administered 2013-06-08: 2 [IU] via SUBCUTANEOUS

## 2013-06-07 MED ORDER — PNEUMOCOCCAL VAC POLYVALENT 25 MCG/0.5ML IJ INJ
0.5000 mL | INJECTION | INTRAMUSCULAR | Status: AC
  Administered 2013-06-08: 0.5 mL via INTRAMUSCULAR
  Filled 2013-06-07: qty 0.5

## 2013-06-07 MED ORDER — SODIUM CHLORIDE 0.9 % IV SOLN
2000.0000 mg | Freq: Once | INTRAVENOUS | Status: AC
  Administered 2013-06-07: 2000 mg via INTRAVENOUS
  Filled 2013-06-07: qty 2000

## 2013-06-07 MED ORDER — INFLUENZA VAC SPLIT QUAD 0.5 ML IM SUSP
0.5000 mL | INTRAMUSCULAR | Status: AC
  Administered 2013-06-08: 0.5 mL via INTRAMUSCULAR
  Filled 2013-06-07: qty 0.5

## 2013-06-07 NOTE — Progress Notes (Signed)
UR chart review completed.  

## 2013-06-07 NOTE — Progress Notes (Addendum)
[facility] PROGRESS NOTE  Patricia Stanton AVW:[id number] DOB: 1986/02/02 DOA: 06/06/2013 PCP: No PCP Per Patient    Code Status: Full code Family Communication: Discussed with father Disposition Plan: Likely discharge home in the next 24-48 hours.   Consultants:  None  Procedures:  None  Antibiotics:  Levaquin 06/06/2013  Vancomycin 06/06/2013  HPI/Subjective: She says that she is breathing better. She is less short of breath. She says her oxygen saturations are improving.  Objective: Filed Vitals:   06/07/13 0614  BP: 124/84  Pulse: 71  Temp: 97.4 F (36.3 C)  Resp: 20    Intake/Output Summary (Last 24 hours) at 06/07/13 1031 Last data filed at 06/07/13 0700  Gross per 24 hour  Intake    795 ml  Output      0 ml  Net    795 ml   Filed Weights   06/06/13 1852  Weight: 129.1 kg (284 lb 9.8 oz)    Exam:   General:  Pleasant obese 27 year old woman sitting up in bed, in no acute distress.  Cardiovascular: S1, S2, with no murmurs rubs or gallops.  Respiratory: Few scattered rhonchus wheezing, slightly decreased from yesterday. Breathing is nonlabored.  Abdomen: Obese, positive bowel sounds, soft, nontender, nondistended.  Musculoskeletal: No acute hot red joints.   Data Reviewed: Basic Metabolic Panel:  Recent Labs Lab 06/06/13 1657 06/07/13 0415  NA 134* 138  K 3.9 4.2  CL 100 104  CO2 22 23  GLUCOSE 176* 151*  BUN 8 9  CREATININE 0.60 0.50  CALCIUM 9.4 9.5   Liver Function Tests:  Recent Labs Lab 06/06/13 1657  AST 15  ALT 17  ALKPHOS 69  BILITOT 0.2*  PROT 7.4  ALBUMIN 3.5   No results found for this basename: LIPASE, AMYLASE,  in the last 168 hours No results found for this basename: AMMONIA,  in the last 168 hours CBC:  Recent Labs Lab 06/06/13 1657 06/07/13 0415  WBC 8.4 11.6*  NEUTROABS 7.3  --   HGB 12.7 12.7  HCT 39.3 38.7  MCV 82.4 82.9  PLT 243 241   Cardiac Enzymes: No results found for this  basename: CKTOTAL, CKMB, CKMBINDEX, TROPONINI,  in the last 168 hours BNP (last 3 results) No results found for this basename: PROBNP,  in the last 8760 hours CBG: No results found for this basename: GLUCAP,  in the last 168 hours  No results found for this or any previous visit (from the past 240 hour(s)).   Studies: Dg Chest 2 View  06/06/2013   CLINICAL DATA:  Shortness of breath, cough  EXAM: CHEST  2 VIEW  COMPARISON:  None.  FINDINGS: Low lung volumes with vascular crowding. No frank interstitial edema. Mild left upper lobe/ perihilar opacity is possible, pneumonia not excluded. No pleural effusion or pneumothorax.  The heart is top-normal in size for inspiration.  Mild degenerative changes of the thoracic spine.  IMPRESSION: Low lung volumes.  Mild left upper lobe/ perihilar opacity is possible, pneumonia not excluded.   Electronically Signed   By: Charline Bills M.D.   On: 06/06/2013 08:04    Scheduled Meds: . albuterol  2.5 mg Nebulization Q4H  . benzonatate  100 mg Oral TID  . enoxaparin (LOVENOX) injection  40 mg Subcutaneous Q24H  . famotidine  20 mg Oral Daily  . insulin aspart  0-15 Units Subcutaneous TID WC  . insulin aspart  0-5 Units Subcutaneous QHS  . insulin glargine  10 Units Subcutaneous QHS  .  ipratropium  0.5 mg Nebulization Q4H  . levofloxacin (LEVAQUIN) IV  750 mg Intravenous Q24H  . methylPREDNISolone (SOLU-MEDROL) injection  60 mg Intravenous Q8H  . nicotine  14 mg Transdermal Daily  . vancomycin  1,250 mg Intravenous Q12H  . vancomycin  2,000 mg Intravenous Once   Continuous Infusions:   Assessment:  Principal Problem:   Acute bronchitis Active Problems:   Community acquired pneumonia   Acute respiratory failure with hypoxia   Tobacco abuse   Morbid obesity   Lactic acidosis   Hyperglycemia, drug-induced  1. Acute bronchitis with possible early community-acquired pneumonia. The patient has been exposed to sick contacts as she is a Advertising copywriter at [employer]. She is improving symptomatically. We'll continue antibiotics, bronchodilators, and medications for cough. We'll add Solu-Medrol for bronchospasms which may hasten resolution.  Acute respiratory failure with hypoxia. This is resolving with treatment. Continue oxygen to keep her oxygen saturation is greater than 90%.  Tobacco abuse. The patient was strongly advised to stop smoking.  Lactic acidosis. Resolved with hydration.  Hyperglycemia, thought to be steroid induced. We'll start sliding scale NovoLog. Hemoglobin A1c pending.   Plan: 1. Continue current management. We'll add Solu-Medrol. 2. Start sliding-scale NovoLog. We'll check hemoglobin A1c pending. 3. We'll check the TSH pending. 4. Possible discharge tomorrow.  Time spent: 25 minutes.    [facility]  [facility] Pager  [phone]. If 7PM-7AM, please contact night-coverage at [email or web address], password [facility] 06/07/2013, 10:31 AM  LOS: 1 day

## 2013-06-07 NOTE — Progress Notes (Signed)
Pt wanted to take a walk. She wanted to go to the cafeteria.  I voiced to her that she should stay on the unit because she was being monitored by the telemetry and that we would not be able to monitor her otherwise.  She verbalized understanding.  I will continue to remind her as needed not to leave the unit.

## 2013-06-07 NOTE — Care Management Note (Addendum)
    Page 1 of 1   06/08/2013     1:30:27 PM   CARE MANAGEMENT NOTE 06/08/2013  Patient:  Patricia Stanton,Patricia Stanton   Account Number:  [id number]  Date Initiated:  06/07/2013  Documentation initiated by:  Sharrie Rothman  Subjective/Objective Assessment:   Pt admitted from home with bronchitis. Pt lives with her daughter and will return home at discharge. 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 Pt has neb machine for home use.     Action/Plan:   No CM needs noted.   Anticipated DC Date:  06/08/2013   Anticipated DC Plan:  HOME/SELF CARE      DC Planning Services  CM consult      Choice offered to / List presented to:             Status of service:  Completed, signed off Medicare Important Message given?   (If response is "NO", the following Medicare IM given date fields will be blank) Date Medicare IM given:   Date Additional Medicare IM given:    Discharge Disposition:  HOME/SELF CARE  Per UR Regulation:    If discussed at Long Length of Stay Meetings, dates discussed:    Comments:  06/08/13 1315 Arlyss Queen, RN BSN CM Pt discharged home today. No CM needs noted.  06/07/13 1117 Arlyss Queen, RN BSN CM

## 2013-06-08 DIAGNOSIS — F17200 Nicotine dependence, unspecified, uncomplicated: Secondary | ICD-10-CM

## 2013-06-08 LAB — GLUCOSE, CAPILLARY

## 2013-06-08 MED ORDER — ALBUTEROL SULFATE HFA 108 (90 BASE) MCG/ACT IN AERS: 2.0000 | INHALATION_SPRAY | Freq: Four times a day (QID) | RESPIRATORY_TRACT | Status: DC

## 2013-06-08 MED ORDER — PREDNISONE 10 MG PO TABS: ORAL_TABLET | ORAL | Status: DC

## 2013-06-08 NOTE — Progress Notes (Signed)
Pt discharged home today per Dr. Sherrie Mustache. Pt's IV site D/C'd and WNL. Pt's VS stable at this time. Pt provided with home medication list, discharge instructions and prescriptions. Verbalized understanding. Pt ambulated off in stable condition accompanied by RN.

## 2013-06-08 NOTE — Discharge Summary (Signed)
Physician Discharge Summary  Prescious Stanton WUJ:[id number] DOB: 12/27/1985 DOA: 06/06/2013  PCP: No PCP Per Patient  Admit date: 06/06/2013 Discharge date: 06/08/2013  Time spent: Greater than 30 minutes minutes  Recommendations for Outpatient Follow-up:  1.   Discharge Diagnoses:  Principal Problem:   Acute bronchitis   Early Community acquired pneumonia   Acute respiratory failure with hypoxia. Resolved   Tobacco abuse   Morbid obesity   Lactic acidosis   Hyperglycemia, steroid-induced   Discharge Condition: Improved  Diet recommendation: Regular  Filed Weights   06/06/13 1852  Weight: 129.1 kg (284 lb 9.8 oz)    History of present illness:   Patricia Stanton is a 27 y.o. female with no significant past medical history, who presented to the emergency department with a chief complaint of shortness of breath. She actually presented to the emergency department earlier for the same symptoms. She was treated symptomatically and discharged from the ED on a prednisone taper, Levaquin, albuterol inhaler, and cough suppressants. She was also evaluated a few days ago at [facility] emergency department. She was treated for bronchitis and discharged from the ED on a Z-Pak, Tussionex, and albuterol inhaler. She came back to the emergency department because she continued to be short of breath and wheezing. Her symptoms of shortness of breath, chest congestion, fever, and chills started approximately 4 days ago. She had a cough with dark-colored sputum, but at other times, her sputum was clear. She has had ongoing wheezing and chest congestion. She had a fever of 102F at home. She had a mildly sore throat, but no earache, or runny nose. 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 She had been exposed to sick contacts. She smokes approximately half a pack per day of cigarettes.  In the emergency department, she was oxygenating in the mid 80s on room air. With oxygen, it increased to the mid  90s. Her temperature was 99.1. Her chest x-ray revealed low lung volumes and a possible mild left upper lobe opacity. Her lab data were significant for a glucose of 176 and her lactic acid level of 3.01. She was admitted for further evaluation and management.    Hospital Course:   The patient was started on IV Levaquin. IV Vancomycin was added because she is a Ambulance person. Albuterol/atrovent nebs were given every 4 hours. IV solumedrol was given for additional treatment of bronchospasms. Symptomatic treatment was started with Tessalon Perles and Tussionex. A nicotine patch was placed and she was advised to stop smoking. Oxygen was applied to keep her 02 sats greater than 90%. IVF hydration was given for relative dehydration related to lactic acidosis. Steroid-induced hyperglycemia was treated with insulin. Her HbA1c was 6.0. Her TSH was within normal limits at 0.56.  The patient improved clinically and symptomatically. Her oxygen saturation improved to 98% on room air. Lactic acid level normalized. She was instructed to continue Levaquin prescribed in the ED until completion. She was also discharged on a prednisone taper and albuterol inhaler.   Procedures:  None  Consultations:  None  Discharge Exam: Filed Vitals:   06/08/13 0536  BP: 131/80  Pulse: 70  Temp: 98.4 F (36.9 C)  Resp: 20  02 sat 98%  General: Pleasant obese 27 year old woman sitting up in bed, in no acute distress. Cardiovascular: S1, S2, with no murmurs rubs or gallops. Respiratory: Few scattered wheezes, decreased from yesterday. Breathing is nonlabored. Abdomen: Obese, positive bowel sounds, soft, nontender, nondistended. Musculoskeletal: No acute hot red  joints.    Discharge Instructions  Discharge Orders   Future Orders Complete By Expires   Diet - low sodium heart healthy  As directed    Discharge instructions  As directed    Comments:     Stop smoking. Take medications as prescribed.    Increase activity slowly  As directed        Medication List         albuterol 108 (90 BASE) MCG/ACT inhaler  Commonly known as:  PROVENTIL HFA;VENTOLIN HFA  Inhale 2 puffs into the lungs 4 (four) times daily.     benzonatate 100 MG capsule  Commonly known as:  TESSALON  Take 1 capsule (100 mg total) by mouth every 8 (eight) hours.     chlorpheniramine-HYDROcodone 10-8 MG/5ML Lqcr  Commonly known as:  TUSSIONEX PENNKINETIC ER  Take 5 mLs by mouth every 12 (twelve) hours.     guaiFENesin 600 MG 12 hr tablet  Commonly known as:  MUCINEX  Take 1,200 mg by mouth 2 (two) times daily.     guaiFENesin-dextromethorphan 100-10 MG/5ML syrup  Commonly known as:  ROBITUSSIN DM  Take 10 mLs by mouth 3 (three) times daily as needed for cough.     levofloxacin 500 MG tablet  Commonly known as:  LEVAQUIN  Take 1 tablet (500 mg total) by mouth daily.     predniSONE 10 MG tablet  Commonly known as:  DELTASONE  Starting tomorrow, take 6 tablets for 1 day; then 5 tablets next day; then 4 tablets next day; then 3 tablets next day; then 2 tablets next day; then 1 tablet the next day; then stop.       No Known Allergies    The results of significant diagnostics from this hospitalization (including imaging, microbiology, ancillary and laboratory) are listed below for reference.    Significant Diagnostic Studies: Dg Chest 2 View  06/06/2013   CLINICAL DATA:  Shortness of breath, cough  EXAM: CHEST  2 VIEW  COMPARISON:  None.  FINDINGS: Low lung volumes with vascular crowding. No frank interstitial edema. Mild left upper lobe/ perihilar opacity is possible, pneumonia not excluded. No pleural effusion or pneumothorax.  The heart is top-normal in size for inspiration.  Mild degenerative changes of the thoracic spine.  IMPRESSION: Low lung volumes.  Mild left upper lobe/ perihilar opacity is possible, pneumonia not excluded.   Electronically Signed   By: Charline Bills M.D.   On: 06/06/2013 08:04     Microbiology: No results found for this or any previous visit (from the past 240 hour(s)).   Labs: Basic Metabolic Panel:  Recent Labs Lab 06/06/13 1657 06/07/13 0415  NA 134* 138  K 3.9 4.2  CL 100 104  CO2 22 23  GLUCOSE 176* 151*  BUN 8 9  CREATININE 0.60 0.50  CALCIUM 9.4 9.5   Liver Function Tests:  Recent Labs Lab 06/06/13 1657  AST 15  ALT 17  ALKPHOS 69  BILITOT 0.2*  PROT 7.4  ALBUMIN 3.5   No results found for this basename: LIPASE, AMYLASE,  in the last 168 hours No results found for this basename: AMMONIA,  in the last 168 hours CBC:  Recent Labs Lab 06/06/13 1657 06/07/13 0415  WBC 8.4 11.6*  NEUTROABS 7.3  --   HGB 12.7 12.7  HCT 39.3 38.7  MCV 82.4 82.9  PLT 243 241   Cardiac Enzymes: No results found for this basename: CKTOTAL, CKMB, CKMBINDEX, TROPONINI,  in the last 168  hours BNP: BNP (last 3 results) No results found for this basename: PROBNP,  in the last 8760 hours CBG:  Recent Labs Lab 06/07/13 1104 06/07/13 1636 06/07/13 2120 06/08/13 0730  GLUCAP 119* 175* 147* 127*       Signed:  Pesach Frisch  [facility] 06/08/2013, 7:12 PM

## 2013-09-04 ENCOUNTER — Emergency Department (HOSPITAL_COMMUNITY)
Admission: EM | Admit: 2013-09-04 | Discharge: 2013-09-04 | Disposition: A | Discharge status: Home / Self Care | Payer: PRIVATE HEALTH INSURANCE | Attending: Emergency Medicine | Admitting: Emergency Medicine

## 2013-09-04 DIAGNOSIS — J40 Bronchitis, not specified as acute or chronic: Secondary | ICD-10-CM

## 2013-09-04 DIAGNOSIS — E669 Obesity, unspecified: Secondary | ICD-10-CM | POA: Insufficient documentation

## 2013-09-04 DIAGNOSIS — R5381 Other malaise: Secondary | ICD-10-CM | POA: Insufficient documentation

## 2013-09-04 DIAGNOSIS — Z792 Long term (current) use of antibiotics: Secondary | ICD-10-CM | POA: Insufficient documentation

## 2013-09-04 DIAGNOSIS — J209 Acute bronchitis, unspecified: Secondary | ICD-10-CM | POA: Insufficient documentation

## 2013-09-04 DIAGNOSIS — J9801 Acute bronchospasm: Secondary | ICD-10-CM

## 2013-09-04 DIAGNOSIS — IMO0001 Reserved for inherently not codable concepts without codable children: Secondary | ICD-10-CM | POA: Insufficient documentation

## 2013-09-04 DIAGNOSIS — F17200 Nicotine dependence, unspecified, uncomplicated: Secondary | ICD-10-CM | POA: Insufficient documentation

## 2013-09-04 DIAGNOSIS — Z8659 Personal history of other mental and behavioral disorders: Secondary | ICD-10-CM | POA: Insufficient documentation

## 2013-09-04 DIAGNOSIS — Z79899 Other long term (current) drug therapy: Secondary | ICD-10-CM | POA: Insufficient documentation

## 2013-09-04 DIAGNOSIS — M2550 Pain in unspecified joint: Secondary | ICD-10-CM | POA: Insufficient documentation

## 2013-09-04 DIAGNOSIS — J029 Acute pharyngitis, unspecified: Secondary | ICD-10-CM | POA: Insufficient documentation

## 2013-09-04 MED ORDER — ALBUTEROL SULFATE (5 MG/ML) 0.5% IN NEBU
2.5000 mg | INHALATION_SOLUTION | RESPIRATORY_TRACT | Status: DC | PRN
  Administered 2013-09-04: 2.5 mg via RESPIRATORY_TRACT
  Filled 2013-09-04: qty 0.5

## 2013-09-04 MED ORDER — ALBUTEROL SULFATE (2.5 MG/3ML) 0.083% IN NEBU: 2.5000 mg | INHALATION_SOLUTION | Freq: Four times a day (QID) | RESPIRATORY_TRACT | Status: DC | PRN

## 2013-09-04 MED ORDER — PREDNISONE 10 MG PO TABS: 20.0000 mg | ORAL_TABLET | Freq: Every day | ORAL | Status: DC

## 2013-09-04 MED ORDER — PREDNISONE 50 MG PO TABS
60.0000 mg | ORAL_TABLET | Freq: Once | ORAL | Status: AC
  Administered 2013-09-04: 60 mg via ORAL
  Filled 2013-09-04 (×2): qty 1

## 2013-09-04 MED ORDER — HYDROCOD POLST-CHLORPHEN POLST 10-8 MG/5ML PO LQCR: 5.0000 mL | Freq: Two times a day (BID) | ORAL | Status: DC

## 2013-09-04 MED ORDER — ALBUTEROL SULFATE HFA 108 (90 BASE) MCG/ACT IN AERS: 1.0000 | INHALATION_SPRAY | Freq: Four times a day (QID) | RESPIRATORY_TRACT | Status: DC | PRN

## 2013-09-04 MED ORDER — AZITHROMYCIN 250 MG PO TABS: 250.0000 mg | ORAL_TABLET | Freq: Every day | ORAL | Status: DC

## 2013-09-04 NOTE — ED Provider Notes (Signed)
CSN: [id number]     Arrival date & time 09/04/13  0720 History   This chart was scribed for [name], MD by Bennett Scrape, ED Scribe. This patient was seen in room APA03/APA03 and the patient's care was started at 7:32 AM.    Chief Complaint  Patient presents with  . Cough  . Shortness of Breath    The history is provided by the patient. No language interpreter was used.    HPI Comments: Patricia Stanton is a 27 y.o. female who presents to the Emergency Department complaining of 3 days of gradual onset, gradually worsening, constant cough with associated wheezing, fatigue, myalgias, SOB secondary to coughing spells, decreased appetite and joint aches. She states that she was having a sore throat and "gland swelling" last week which improved with OTC medications such as Tylenol and Robitussin. She states that she then she began having cough productive of clear sputum, wheezing, fatigue, myalgias, joint aches and SOB a few days after. She is here today due to a new onset of coughing up red mucus. She reports a fever of 101 over night that "broke on it's own". She reports prior episodes of similar wheezing with infections that improves with inhalers. She states that she has a h/o PNA but denies asthma or COPD. She denies any nausea, emesis, diarrhea and rashes. She denies being on any daily medications. She reports getting an influenza vaccination this year.   Past Medical History  Diagnosis Date  . Anxiety   . Obesity   . Tobacco abuse    Past Surgical History  Procedure Laterality Date  . Femur fracture surgery    . Knee arthroscopy    . Tubal ligation     No family history on file. History  Substance Use Topics  . Smoking status: Current Every Day Smoker -- 0.50 packs/day  . Smokeless tobacco: Not on file  . Alcohol Use: No   No OB history provided.  Review of Systems  Constitutional: Positive for fever, appetite change and fatigue. Negative for chills and diaphoresis.   HENT: Positive for sore throat. Negative for mouth sores and trouble swallowing.   Eyes: Negative for visual disturbance.  Respiratory: Positive for cough, shortness of breath and wheezing. Negative for chest tightness.   Cardiovascular: Negative for chest pain.  Gastrointestinal: Negative for nausea, vomiting, abdominal pain, diarrhea and abdominal distention.  Endocrine: Negative for polydipsia, polyphagia and polyuria.  Genitourinary: Negative for dysuria, frequency and hematuria.  Musculoskeletal: Positive for myalgias. Negative for gait problem.  Skin: Negative for color change, pallor and rash.  Neurological: Negative for dizziness, syncope, light-headedness and headaches.  Hematological: Does not bruise/bleed easily.  Psychiatric/Behavioral: Negative for behavioral problems and confusion.    Allergies  Review of patient's allergies indicates no known allergies.  Home Medications   Current Outpatient Rx  Name  Route  Sig  Dispense  Refill  . albuterol (PROVENTIL HFA;VENTOLIN HFA) 108 (90 BASE) MCG/ACT inhaler   Inhalation   Inhale 2 puffs into the lungs 4 (four) times daily.         Marland Kitchen albuterol (PROVENTIL HFA;VENTOLIN HFA) 108 (90 BASE) MCG/ACT inhaler   Inhalation   Inhale 1-2 puffs into the lungs every 6 (six) hours as needed for wheezing.   1 Inhaler   0   . albuterol (PROVENTIL) (2.5 MG/3ML) 0.083% nebulizer solution   Nebulization   Take 3 mLs (2.5 mg total) by nebulization every 6 (six) hours as needed for wheezing or shortness of  breath.   75 mL   0   . azithromycin (ZITHROMAX Z-PAK) 250 MG tablet   Oral   Take 1 tablet (250 mg total) by mouth daily.   6 tablet   0   . benzonatate (TESSALON) 100 MG capsule   Oral   Take 1 capsule (100 mg total) by mouth every 8 (eight) hours.   21 capsule   0   . chlorpheniramine-HYDROcodone (TUSSIONEX PENNKINETIC ER) 10-8 MG/5ML LQCR   Oral   Take 5 mLs by mouth every 12 (twelve) hours.   60 mL   0   .  chlorpheniramine-HYDROcodone (TUSSIONEX PENNKINETIC ER) 10-8 MG/5ML LQCR   Oral   Take 5 mLs by mouth every 12 (twelve) hours.   60 mL   0   . guaiFENesin (MUCINEX) 600 MG 12 hr tablet   Oral   Take 1,200 mg by mouth 2 (two) times daily.         Marland Kitchen guaiFENesin-dextromethorphan (ROBITUSSIN DM) 100-10 MG/5ML syrup   Oral   Take 10 mLs by mouth 3 (three) times daily as needed for cough.         Marland Kitchen levofloxacin (LEVAQUIN) 500 MG tablet   Oral   Take 1 tablet (500 mg total) by mouth daily.   10 tablet   0   . predniSONE (DELTASONE) 10 MG tablet      Starting tomorrow, take 6 tablets for 1 day; then 5 tablets next day; then 4 tablets next day; then 3 tablets next day; then 2 tablets next day; then 1 tablet the next day; then stop.   21 tablet   0   . predniSONE (DELTASONE) 10 MG tablet   Oral   Take 2 tablets (20 mg total) by mouth daily.   10 tablet   0    Triage Vitals: BP 148/82  Pulse 114  Temp(Src) 97.9 F (36.6 C) (Oral)  Resp 20  Ht 5\' 5"  (1.651 m)  Wt 190 lb 5 oz (86.325 kg)  BMI 31.67 kg/m2  SpO2 97%  LMP 08/03/2013  Physical Exam  Nursing note and vitals reviewed. Constitutional: She is oriented to person, place, and time. She appears well-developed and well-nourished. No distress.  HENT:  Head: Normocephalic and atraumatic.  Mouth/Throat: Oropharynx is clear and moist.  Eyes: Conjunctivae are normal. Pupils are equal, round, and reactive to light. No scleral icterus.  Neck: Normal range of motion. Neck supple. No thyromegaly present.  Cardiovascular: Normal rate and regular rhythm.  Exam reveals no gallop and no friction rub.   No murmur heard. Pulmonary/Chest: Effort normal. No respiratory distress. She has wheezes (in all fields). She has no rales.  Abdominal: Soft. Bowel sounds are normal. She exhibits no distension. There is no tenderness. There is no rebound.  Musculoskeletal: Normal range of motion. She exhibits no edema (no ankle swelling) and no  tenderness (no calf tenderness).  Lymphadenopathy:    She has cervical adenopathy (one swollen lymph node in left neck).  Neurological: She is alert and oriented to person, place, and time.  Skin: Skin is warm and dry. No rash noted.  Psychiatric: She has a normal mood and affect. Her behavior is normal.    ED Course  Procedures (including critical care time)  DIAGNOSTIC STUDIES: Oxygen Saturation is 97% on room air, adequate by my interpretation.    COORDINATION OF CARE: 7:37 AM-Discussed treatment plan which includes breathing treatment and prednisone with pt at bedside and pt agreed to plan. Informed pt of  prednisone, antibiotics, cough medications and inhaler for at home treatment. Advised the pt that a CXR is not needed at this point.  Labs Review Labs Reviewed - No data to display Imaging Review No results found.  EKG Interpretation   None       MDM   1. Bronchitis   2. Bronchospasm    Given multiple nebs, and PO Prednisone.  Clears well,  Re-exam shows clear lungs, well oxygenated.  Medical screening examination/treatment/procedure(s) were performed by non-physician practitioner and as supervising physician I was immediately available for consultation/collaboration.  EKG Interpretation   None           [name], MD 09/05/13 [phone]

## 2013-09-04 NOTE — ED Notes (Signed)
Pt called for nurse. I went to room and asked pt if she needed anything. She then said, "I need respiratory" I explained that respiratory was aware and would here as soon as possible." Pt then stated, "I don't want to be up in here all day." I then told the patient that this is the Emergency room and there is sometimes a wait on things"

## 2013-09-04 NOTE — ED Notes (Signed)
Pt informed secretary that she would like a different nurse.

## 2013-09-04 NOTE — ED Notes (Signed)
Pt. States that she has a cough starting on Wed. And has gotten progressively worse over the past few days. Pt. Reports coughing up green and red mucous. Pt. reports SOB when coughing and when up moving around.

## 2013-09-04 NOTE — ED Notes (Signed)
Went into room to medicate pt, Pt was on phone and held up finger for me to "hold on" a minute. I then walked out until pt is finished with phone call.

## 2013-11-27 ENCOUNTER — Encounter (HOSPITAL_COMMUNITY): Payer: Self-pay | Admitting: Emergency Medicine

## 2013-11-27 ENCOUNTER — Emergency Department (HOSPITAL_COMMUNITY)
Admission: EM | Admit: 2013-11-27 | Discharge: 2013-11-27 | Disposition: A | Discharge status: Home / Self Care | Payer: Medicaid Other | Attending: Emergency Medicine | Admitting: Emergency Medicine

## 2013-11-27 DIAGNOSIS — H6090 Unspecified otitis externa, unspecified ear: Secondary | ICD-10-CM

## 2013-11-27 DIAGNOSIS — E669 Obesity, unspecified: Secondary | ICD-10-CM | POA: Insufficient documentation

## 2013-11-27 DIAGNOSIS — Z8659 Personal history of other mental and behavioral disorders: Secondary | ICD-10-CM | POA: Insufficient documentation

## 2013-11-27 DIAGNOSIS — H60399 Other infective otitis externa, unspecified ear: Secondary | ICD-10-CM | POA: Insufficient documentation

## 2013-11-27 DIAGNOSIS — Z9889 Other specified postprocedural states: Secondary | ICD-10-CM | POA: Insufficient documentation

## 2013-11-27 DIAGNOSIS — R109 Unspecified abdominal pain: Secondary | ICD-10-CM | POA: Insufficient documentation

## 2013-11-27 DIAGNOSIS — Z9851 Tubal ligation status: Secondary | ICD-10-CM | POA: Insufficient documentation

## 2013-11-27 DIAGNOSIS — Z79899 Other long term (current) drug therapy: Secondary | ICD-10-CM | POA: Insufficient documentation

## 2013-11-27 DIAGNOSIS — F17200 Nicotine dependence, unspecified, uncomplicated: Secondary | ICD-10-CM | POA: Insufficient documentation

## 2013-11-27 DIAGNOSIS — Z3202 Encounter for pregnancy test, result negative: Secondary | ICD-10-CM | POA: Insufficient documentation

## 2013-11-27 DIAGNOSIS — R52 Pain, unspecified: Secondary | ICD-10-CM

## 2013-11-27 DIAGNOSIS — L905 Scar conditions and fibrosis of skin: Secondary | ICD-10-CM | POA: Insufficient documentation

## 2013-11-27 HISTORY — DX: Unspecified ovarian cyst, unspecified side: N83.209

## 2013-11-27 LAB — CBC WITH DIFFERENTIAL/PLATELET
Basophils Absolute: 0 10*3/uL (ref 0.0–0.1)
Basophils Relative: 0 % (ref 0–1)
EOS PCT: 1 % (ref 0–5)
Eosinophils Absolute: 0.1 10*3/uL (ref 0.0–0.7)
HEMATOCRIT: 39.7 % (ref 36.0–46.0)
Hemoglobin: 13.4 g/dL (ref 12.0–15.0)
LYMPHS PCT: 21 % (ref 12–46)
Lymphs Abs: 2.9 10*3/uL (ref 0.7–4.0)
MCH: 27.4 pg (ref 26.0–34.0)
MCHC: 33.8 g/dL (ref 30.0–36.0)
MCV: 81.2 fL (ref 78.0–100.0)
MONO ABS: 0.8 10*3/uL (ref 0.1–1.0)
Monocytes Relative: 6 % (ref 3–12)
NEUTROS ABS: 10 10*3/uL — AB (ref 1.7–7.7)
Neutrophils Relative %: 73 % (ref 43–77)
Platelets: 262 10*3/uL (ref 150–400)
RBC: 4.89 MIL/uL (ref 3.87–5.11)
RDW: 14.9 % (ref 11.5–15.5)
WBC: 13.8 10*3/uL — ABNORMAL HIGH (ref 4.0–10.5)

## 2013-11-27 LAB — URINALYSIS, ROUTINE W REFLEX MICROSCOPIC
BILIRUBIN URINE: NEGATIVE
GLUCOSE, UA: NEGATIVE mg/dL
HGB URINE DIPSTICK: NEGATIVE
Ketones, ur: NEGATIVE mg/dL
Leukocytes, UA: NEGATIVE
Nitrite: NEGATIVE
PH: 6 (ref 5.0–8.0)
Protein, ur: NEGATIVE mg/dL
Specific Gravity, Urine: >1.03 — ABNORMAL HIGH (ref 1.005–1.030)
Urobilinogen, UA: 0.2 mg/dL (ref 0.0–1.0)

## 2013-11-27 LAB — BASIC METABOLIC PANEL
BUN: 7 mg/dL (ref 6–23)
CO2: 25 meq/L (ref 19–32)
CREATININE: 0.58 mg/dL (ref 0.50–1.10)
Calcium: 9 mg/dL (ref 8.4–10.5)
Chloride: 99 mEq/L (ref 96–112)
GFR calc Af Amer: >90 mL/min (ref >90)
GFR calc non Af Amer: >90 mL/min (ref >90)
GLUCOSE: 102 mg/dL — AB (ref 70–99)
Potassium: 4 mEq/L (ref 3.7–5.3)
SODIUM: 136 meq/L — AB (ref 137–147)

## 2013-11-27 LAB — PREGNANCY, URINE: Preg Test, Ur: NEGATIVE

## 2013-11-27 MED ORDER — HYDROCODONE-ACETAMINOPHEN 5-325 MG PO TABS
2.0000 | ORAL_TABLET | Freq: Once | ORAL | Status: AC
  Administered 2013-11-27: 2 via ORAL
  Filled 2013-11-27: qty 2

## 2013-11-27 MED ORDER — CIPROFLOXACIN-DEXAMETHASONE 0.3-0.1 % OT SUSP
OTIC | Status: AC
  Filled 2013-11-27: qty 7.5

## 2013-11-27 MED ORDER — CIPROFLOXACIN-DEXAMETHASONE 0.3-0.1 % OT SUSP
4.0000 [drp] | Freq: Two times a day (BID) | OTIC | Status: DC
  Administered 2013-11-27: 4 [drp] via OTIC
  Filled 2013-11-27: qty 7.5

## 2013-11-27 MED ORDER — HYDROCODONE-ACETAMINOPHEN 5-325 MG PO TABS: 2.0000 | ORAL_TABLET | Freq: Four times a day (QID) | ORAL | Status: DC | PRN

## 2013-11-27 NOTE — Discharge Instructions (Signed)
Otitis Externa Otitis externa is a bacterial or fungal infection of the outer ear canal. This is the area from the eardrum to the outside of the ear. Otitis externa is sometimes called "swimmer's ear." CAUSES  Possible causes of infection include:  Swimming in dirty water.  Moisture remaining in the ear after swimming or bathing.  Mild injury (trauma) to the ear.  Objects stuck in the ear (foreign body).  Cuts or scrapes (abrasions) on the outside of the ear. SYMPTOMS  The first symptom of infection is often itching in the ear canal. Later signs and symptoms may include swelling and redness of the ear canal, ear pain, and yellowish-white fluid (pus) coming from the ear. The ear pain may be worse when pulling on the earlobe. DIAGNOSIS  Your caregiver will perform a physical exam. A sample of fluid may be taken from the ear and examined for bacteria or fungi. TREATMENT  Antibiotic ear drops are often given for 10 to 14 days. Treatment may also include pain medicine or corticosteroids to reduce itching and swelling. PREVENTION   Keep your ear dry. Use the corner of a towel to absorb water out of the ear canal after swimming or bathing.  Avoid scratching or putting objects inside your ear. This can damage the ear canal or remove the protective wax that lines the canal. This makes it easier for bacteria and fungi to grow.  Avoid swimming in lakes, polluted water, or poorly chlorinated pools.  You may use ear drops made of rubbing alcohol and vinegar after swimming. Combine equal parts of white vinegar and alcohol in a bottle. Put 3 or 4 drops into each ear after swimming. HOME CARE INSTRUCTIONS   Apply antibiotic ear drops to the ear canal as prescribed by your caregiver.  Only take over-the-counter or prescription medicines for pain, discomfort, or fever as directed by your caregiver.  If you have diabetes, follow any additional treatment instructions from your caregiver.  Keep all  follow-up appointments as directed by your caregiver. SEEK MEDICAL CARE IF:   You have a fever.  Your ear is still red, swollen, painful, or draining pus after 3 days.  Your redness, swelling, or pain gets worse.  You have a severe headache.  You have redness, swelling, pain, or tenderness in the area behind your ear. MAKE SURE YOU:   Understand these instructions.  Will watch your condition.  Will get help right away if you are not doing well or get worse. Document Released: 09/02/2005 Document Revised: 11/25/2011 Document Reviewed: 09/19/2011 [facility] Patient Information 2014 [employer]. Endometriosis Endometriosis is a condition in which the tissue that lines the uterus (endometrium) grows outside of its normal location. The tissue may grow in many locations close to the uterus, but it commonly grows on the ovaries, fallopian tubes, vagina, or bowel. Because the uterus expels, or sheds, its lining every menstrual cycle, there is bleeding wherever the endometrial tissue is located. This can cause pain because blood is irritating to tissues not normally exposed to it.  CAUSES  The cause of endometriosis is not known.  SIGNS AND SYMPTOMS  Often, there are no symptoms. When symptoms are present, they can vary with the location of the displaced tissue. Various symptoms can occur at different times. Although symptoms occur mainly during a woman's menstrual period, they can also occur midcycle and usually stop with menopause. Some people may go months with no symptoms at all. Symptoms may include:   Back or abdominal pain.  Heavier bleeding during periods.   Pain during intercourse.   Painful bowel movements.   Infertility. DIAGNOSIS  Your health care provider will do a physical exam and ask about your symptoms. Various tests may be done, such as:   Blood tests and urine tests. These are done to help rule out other problems.   Ultrasound. This test is done to look for  abnormal tissue.   An X-ray of the lower bowel (barium enema).  Laparoscopy. In this procedure, a thin, lighted tube with a tiny camera on the end (laparoscope) is inserted into your abdomen. This helps your health care provider look for abnormal tissue to confirm the diagnosis. The health care provider may also remove a small piece of tissue (biopsy) from any abnormal tissue found. This tissue sample can then be sent to a lab so it can be looked at under a microscope. TREATMENT  Treatment will vary and may include:   Medicines to relieve pain. Nonsteroidal anti-inflammatory drugs (NSAIDs) are a type of pain medicine that can help to relieve the pain caused by endometriosis.  Hormonal therapy. When using hormonal therapy, periods are eliminated. This eliminates the monthly exposure to blood by the displaced endometrial tissue.   Surgery. Surgery may sometimes be done to remove the abnormal endometrial tissue. In severe cases, surgery may be done to remove the fallopian tubes, uterus, and ovaries (hysterectomy). HOME CARE INSTRUCTIONS   Only take over-the-counter or prescription medicines for pain, discomfort, or fever as directed by your health care provider. Do not take aspirin because it may increase bleeding when you are not on hormonal therapy.   Avoid activities that produce pain, including sexual activity. SEEK MEDICAL CARE IF:  You have pelvic pain before, after, or during your periods.  You have pelvic pain between periods that gets worse during your period.  You have pelvic pain during or after sex.  You have pelvic pain with bowel movements or urination, especially during your period.  You have problems getting pregnant. SEEK IMMEDIATE MEDICAL CARE IF:   Your pain is severe and is not responding to pain medicine.   You have severe nausea and vomiting, or you cannot keep foods down.   You have pain that is limited to the right lower part of your abdomen.   You  have swelling or increasing pain in your abdomen.   You see blood in your stool.   You have a fever or persistent symptoms for more than 2 3 days.   You have a fever and your symptoms suddenly get worse. MAKE SURE YOU:   Understand these instructions.  Will watch your condition.  Will get help right away if you are not doing well or get worse. Document Released: 08/30/2000 Document Revised: 06/23/2013 Document Reviewed: 04/30/2013 [facility] Patient Information 2014 [place].

## 2013-11-27 NOTE — ED Provider Notes (Signed)
CSN: [id number]     Arrival date & time 11/27/13  1653 History  This chart was scribed for Charles B. Karle Starch, MD by Zettie Pho, ED Scribe. This patient was seen in room APA06/APA06 and the patient's care was started at 5:50 PM.    Chief Complaint  Patient presents with  . Pelvic Pain   The history is provided by the patient. No language interpreter was used.   HPI Comments: Patricia Stanton is a 28 y.o. Female with a history of ovarian cyst, right-sided hernia (per patient), cesarean section, and tubal ligation who presents to the Emergency Department complaining of an intermittent pain to the right pelvic region that she states radiates into the right leg. Patient states that her pain presented about 2 weeks ago, but has been progressively worsening but the patient has had similar pain off and on for at least 2 years. Patient reports some associated decreased urine ouput and decreased appetite. She states that she has not been able to make an appointment with her PCP for these complaints. She denies fever, emesis, vaginal bleeding or discharge. Patient denies any allergies to medications.   Dr. Evie Lacks at [facility] [place], [place])  Past Medical History  Diagnosis Date  . Anxiety   . Obesity   . Tobacco abuse   . Ovarian cyst    Past Surgical History  Procedure Laterality Date  . Femur fracture surgery    . Knee arthroscopy    . Tubal ligation     No family history on file. History  Substance Use Topics  . Smoking status: Current Every Day Smoker -- 0.50 packs/day  . Smokeless tobacco: Not on file  . Alcohol Use: No   OB History   Grav Para Term Preterm Abortions TAB SAB Ect Mult Living                 Review of Systems  A complete 10 system review of systems was obtained and all systems are negative except as noted in the HPI and PMH.    Allergies  Review of patient's allergies indicates no known allergies.  Home Medications   Current Outpatient Rx  Name  Route  Sig   Dispense  Refill  . albuterol (PROVENTIL HFA;VENTOLIN HFA) 108 (90 BASE) MCG/ACT inhaler   Inhalation   Inhale 2 puffs into the lungs 4 (four) times daily.         Marland Kitchen albuterol (PROVENTIL HFA;VENTOLIN HFA) 108 (90 BASE) MCG/ACT inhaler   Inhalation   Inhale 1-2 puffs into the lungs every 6 (six) hours as needed for wheezing.   1 Inhaler   0   . albuterol (PROVENTIL) (2.5 MG/3ML) 0.083% nebulizer solution   Nebulization   Take 3 mLs (2.5 mg total) by nebulization every 6 (six) hours as needed for wheezing or shortness of breath.   75 mL   0   . azithromycin (ZITHROMAX Z-PAK) 250 MG tablet   Oral   Take 1 tablet (250 mg total) by mouth daily.   6 tablet   0   . benzonatate (TESSALON) 100 MG capsule   Oral   Take 1 capsule (100 mg total) by mouth every 8 (eight) hours.   21 capsule   0   . chlorpheniramine-HYDROcodone (TUSSIONEX PENNKINETIC ER) 10-8 MG/5ML LQCR   Oral   Take 5 mLs by mouth every 12 (twelve) hours.   60 mL   0   . chlorpheniramine-HYDROcodone (TUSSIONEX PENNKINETIC ER) 10-8 MG/5ML [facility]   Oral  Take 5 mLs by mouth every 12 (twelve) hours.   60 mL   0   . guaiFENesin (MUCINEX) 600 MG 12 hr tablet   Oral   Take 1,200 mg by mouth 2 (two) times daily.         Marland Kitchen guaiFENesin-dextromethorphan (ROBITUSSIN DM) 100-10 MG/5ML syrup   Oral   Take 10 mLs by mouth 3 (three) times daily as needed for cough.         Marland Kitchen levofloxacin (LEVAQUIN) 500 MG tablet   Oral   Take 1 tablet (500 mg total) by mouth daily.   10 tablet   0   . predniSONE (DELTASONE) 10 MG tablet      Starting tomorrow, take 6 tablets for 1 day; then 5 tablets next day; then 4 tablets next day; then 3 tablets next day; then 2 tablets next day; then 1 tablet the next day; then stop.   21 tablet   0   . predniSONE (DELTASONE) 10 MG tablet   Oral   Take 2 tablets (20 mg total) by mouth daily.   10 tablet   0    Triage Vitals: BP 106/71  Pulse 98  Temp(Src) 98.4 F (36.9 C) (Oral)   Resp 16  Ht 5\' 5"  (1.651 m)  Wt 240 lb (108.863 kg)  BMI 39.94 kg/m2  SpO2 95%  LMP 11/14/2013  Physical Exam  Nursing note and vitals reviewed. Constitutional: She is oriented to person, place, and time. She appears well-developed and well-nourished.  HENT:  Head: Normocephalic and atraumatic.  Eyes: EOM are normal. Pupils are equal, round, and reactive to light.  Neck: Normal range of motion. Neck supple.  Cardiovascular: Normal rate, normal heart sounds and intact distal pulses.   Pulmonary/Chest: Effort normal and breath sounds normal.  Abdominal: Bowel sounds are normal. She exhibits no distension. There is tenderness (diffuse mild, no peritoneal signs). There is no rebound and no guarding.  There is a 1cm x 2cm subcutaneous nodule in R lower abdomen which is the focus of her pain and where she identifies her 'hernia'. This is deep to her prior surgical scars and not a hernia  Musculoskeletal: Normal range of motion. She exhibits no edema and no tenderness.  Neurological: She is alert and oriented to person, place, and time. She has normal strength. No cranial nerve deficit or sensory deficit.  Skin: Skin is warm and dry. No rash noted.  Psychiatric: She has a normal mood and affect.    ED Course  Procedures (including critical care time)  DIAGNOSTIC STUDIES: Oxygen Saturation is 95% on room air, adequate by my interpretation.    COORDINATION OF CARE: 5:55 PM- Ordered UA and blood labs (CBC, CMP). Ordered Vicodin to manage symptoms. Discussed treatment plan with patient at bedside and patient verbalized agreement.     Labs Review Labs Reviewed  URINALYSIS, ROUTINE W REFLEX MICROSCOPIC - Abnormal; Notable for the following:    Specific Gravity, Urine >1.030 (*)    All other components within normal limits  CBC WITH DIFFERENTIAL - Abnormal; Notable for the following:    WBC 13.8 (*)    Neutro Abs 10.0 (*)    All other components within normal limits  BASIC METABOLIC  PANEL - Abnormal; Notable for the following:    Sodium 136 (*)    Glucose, Bld 102 (*)    All other components within normal limits  PREGNANCY, URINE   Imaging Review No results found.   EKG Interpretation None  MDM   Final diagnoses:  Pain in surgical scar  Otitis externa   Review of CT from 2013 shows that the area of tenderness deep to her surgical scar correlates with a subcutaneous fluid collection seen on that scan. There is no hernia there, at the time of that CT, the radiologist felt it could be:   "1.9 x 2.3 cm complex fluid collection versus soft tissue density within the anterior subcutaneous fat overlying the right lower quadrant/right groin. This is a nonspecific finding with a differential that includes endometrial implant/endometriosis if this is along the course of prior surgery, scar tissue, sequelae of prior hematoma, or soft tissue tumor (though felt to be less likely). "  Pt denies any current vaginal complaints, her symptoms are chronic in nature. No concern for acute surgical or infections process. Will check UA and basic labs but plan discharge with pain medications and close PCP followup.   7:22 PM Pt feeling better labs unremarkable. Pt also asked me to look in her ears as they have been bothering her. There is mild-mod otitis externa bilaterally with normal TMs. Ciprodex for otitis externa. Advised close Gyn followup for further eval.     I personally performed the services described in this documentation, which was scribed in my presence. The recorded information has been reviewed and is accurate.       Charles B. Karle Starch, MD 11/27/13 [id number]

## 2013-11-27 NOTE — ED Notes (Signed)
Pain to right pelvic area began 2 weeks ago, worsened over past 2 days. Pt states she is not urinating as she should. Bilateral ear pain.

## 2013-11-27 NOTE — ED Notes (Signed)
Pt unable to urinate at this time.  

## 2014-05-15 ENCOUNTER — Emergency Department (HOSPITAL_COMMUNITY)
Admission: EM | Admit: 2014-05-15 | Discharge: 2014-05-15 | Disposition: A | Discharge status: Home / Self Care | Payer: Medicaid Other | Attending: Emergency Medicine | Admitting: Emergency Medicine

## 2014-05-15 ENCOUNTER — Encounter (HOSPITAL_COMMUNITY): Payer: Self-pay | Admitting: Emergency Medicine

## 2014-05-15 DIAGNOSIS — E669 Obesity, unspecified: Secondary | ICD-10-CM | POA: Insufficient documentation

## 2014-05-15 DIAGNOSIS — Z8659 Personal history of other mental and behavioral disorders: Secondary | ICD-10-CM | POA: Diagnosis not present

## 2014-05-15 DIAGNOSIS — Z8742 Personal history of other diseases of the female genital tract: Secondary | ICD-10-CM | POA: Diagnosis not present

## 2014-05-15 DIAGNOSIS — F17200 Nicotine dependence, unspecified, uncomplicated: Secondary | ICD-10-CM | POA: Insufficient documentation

## 2014-05-15 DIAGNOSIS — Z3202 Encounter for pregnancy test, result negative: Secondary | ICD-10-CM | POA: Insufficient documentation

## 2014-05-15 DIAGNOSIS — R109 Unspecified abdominal pain: Secondary | ICD-10-CM | POA: Diagnosis not present

## 2014-05-15 DIAGNOSIS — Z9851 Tubal ligation status: Secondary | ICD-10-CM | POA: Insufficient documentation

## 2014-05-15 DIAGNOSIS — G8929 Other chronic pain: Secondary | ICD-10-CM | POA: Insufficient documentation

## 2014-05-15 DIAGNOSIS — R110 Nausea: Secondary | ICD-10-CM | POA: Diagnosis not present

## 2014-05-15 HISTORY — DX: Endometriosis, unspecified: N80.9

## 2014-05-15 HISTORY — DX: Headache, unspecified: R51.9

## 2014-05-15 HISTORY — DX: Other chronic pain: G89.29

## 2014-05-15 HISTORY — DX: Unspecified abdominal pain: R10.9

## 2014-05-15 HISTORY — DX: Headache: R51

## 2014-05-15 LAB — CBC WITH DIFFERENTIAL/PLATELET
BASOS ABS: 0 10*3/uL (ref 0.0–0.1)
BASOS PCT: 0 % (ref 0–1)
EOS ABS: 0.1 10*3/uL (ref 0.0–0.7)
Eosinophils Relative: 1 % (ref 0–5)
HCT: 38.1 % (ref 36.0–46.0)
Hemoglobin: 12.9 g/dL (ref 12.0–15.0)
Lymphocytes Relative: 24 % (ref 12–46)
Lymphs Abs: 3 10*3/uL (ref 0.7–4.0)
MCH: 27.6 pg (ref 26.0–34.0)
MCHC: 33.9 g/dL (ref 30.0–36.0)
MCV: 81.6 fL (ref 78.0–100.0)
Monocytes Absolute: 0.8 10*3/uL (ref 0.1–1.0)
Monocytes Relative: 6 % (ref 3–12)
NEUTROS ABS: 8.6 10*3/uL — AB (ref 1.7–7.7)
Neutrophils Relative %: 69 % (ref 43–77)
Platelets: 219 10*3/uL (ref 150–400)
RBC: 4.67 MIL/uL (ref 3.87–5.11)
RDW: 14.2 % (ref 11.5–15.5)
WBC: 12.5 10*3/uL — ABNORMAL HIGH (ref 4.0–10.5)

## 2014-05-15 LAB — COMPREHENSIVE METABOLIC PANEL
ALK PHOS: 83 U/L (ref 39–117)
ALT: 31 U/L (ref 0–35)
AST: 22 U/L (ref 0–37)
Albumin: 3.8 g/dL (ref 3.5–5.2)
Anion gap: 18 — ABNORMAL HIGH (ref 5–15)
BUN: 9 mg/dL (ref 6–23)
CO2: 21 mEq/L (ref 19–32)
Calcium: 9 mg/dL (ref 8.4–10.5)
Chloride: 99 mEq/L (ref 96–112)
Creatinine, Ser: 0.58 mg/dL (ref 0.50–1.10)
GFR calc Af Amer: >90 mL/min (ref >90)
GLUCOSE: 109 mg/dL — AB (ref 70–99)
POTASSIUM: 3.9 meq/L (ref 3.7–5.3)
SODIUM: 138 meq/L (ref 137–147)
Total Bilirubin: 0.3 mg/dL (ref 0.3–1.2)
Total Protein: 7.3 g/dL (ref 6.0–8.3)

## 2014-05-15 LAB — URINALYSIS, ROUTINE W REFLEX MICROSCOPIC
Bilirubin Urine: NEGATIVE
Glucose, UA: NEGATIVE mg/dL
HGB URINE DIPSTICK: NEGATIVE
Ketones, ur: NEGATIVE mg/dL
Leukocytes, UA: NEGATIVE
NITRITE: NEGATIVE
PROTEIN: NEGATIVE mg/dL
Specific Gravity, Urine: 1.025 (ref 1.005–1.030)
UROBILINOGEN UA: 0.2 mg/dL (ref 0.0–1.0)
pH: 6 (ref 5.0–8.0)

## 2014-05-15 LAB — POC URINE PREG, ED: PREG TEST UR: NEGATIVE

## 2014-05-15 MED ORDER — ONDANSETRON 8 MG PO TBDP
8.0000 mg | ORAL_TABLET | Freq: Once | ORAL | Status: AC
  Administered 2014-05-15: 8 mg via ORAL
  Filled 2014-05-15: qty 1

## 2014-05-15 MED ORDER — OXYCODONE-ACETAMINOPHEN 5-325 MG PO TABS
2.0000 | ORAL_TABLET | Freq: Once | ORAL | Status: AC
  Administered 2014-05-15: 2 via ORAL
  Filled 2014-05-15: qty 2

## 2014-05-15 NOTE — ED Provider Notes (Signed)
CSN: [id number]     Arrival date & time 05/15/14  1643 History  This chart was scribed for Sharyon Cable, MD by Peyton Bottoms, ED Scribe. This patient was seen in room APA07/APA07 and the patient's care was started at 5:37 PM.  Chief Complaint  Patient presents with  . Abdominal Pain   Patient is a 28 y.o. female presenting with abdominal pain. The history is provided by the patient. No language interpreter was used.  Abdominal Pain Pain location:  Suprapubic Pain quality: aching   Pain radiates to:  Does not radiate Pain severity:  Moderate Onset quality:  Gradual Timing:  Constant Progression:  Worsening Relieved by:  Nothing Worsened by:  Nothing tried Ineffective treatments:  None tried Associated symptoms: nausea   Associated symptoms: no diarrhea, no dysuria, no hematuria, no vaginal bleeding, no vaginal discharge and no vomiting    HPI Comments: Patricia Stanton is a 28 y.o. female with a history of endometriosis and tubal ligation, who presents to the Emergency Department complaining of gradually worsening lower abdominal pelvic pain for over 3 months.  She reports daily pain.  No acute changes today. She currently complains of nausea but denies associated vomiting, diarrhea, dysuria, hematuria, vaginal bleeding or vaginal discharge. Patient states she has been experiencing pain for the past few months and "can't take it anymore". She states that she feels "her knot is getting bigger".  She states that the knot has been there for 3 years and has gradually worsened and getting bigger.  Past Medical History  Diagnosis Date  . Anxiety   . Obesity   . Tobacco abuse   . Ovarian cyst   . Endometriosis   . Chronic abdominal pain   . Chronic headaches    Past Surgical History  Procedure Laterality Date  . Femur fracture surgery    . Knee arthroscopy    . Tubal ligation     History reviewed. No pertinent family history. History  Substance Use Topics  . Smoking status:  Current Every Day Smoker -- 0.50 packs/day  . Smokeless tobacco: Not on file  . Alcohol Use: No   OB History   Grav Para Term Preterm Abortions TAB SAB Ect Mult Living                 Review of Systems  Gastrointestinal: Positive for nausea and abdominal pain. Negative for vomiting and diarrhea.  Genitourinary: Negative for dysuria, hematuria, vaginal bleeding and vaginal discharge.  All other systems reviewed and are negative.   Allergies  Review of patient's allergies indicates no known allergies.  Home Medications   Prior to Admission medications   Medication Sig Start Date End Date Taking? Authorizing Provider  HYDROcodone-acetaminophen (NORCO/VICODIN) 5-325 MG per tablet Take 2 tablets by mouth every 6 (six) hours as needed. 11/27/13   Charles B. Karle Starch, MD   Triage Vitals: BP 143/71  Pulse 98  Temp(Src) 98.2 F (36.8 C) (Oral)  Resp 16  Ht 5\' 5"  (1.651 m)  Wt 250 lb (113.399 kg)  BMI 41.60 kg/m2  SpO2 100%  LMP 04/26/2014 Physical Exam  Nursing note and vitals reviewed.  CONSTITUTIONAL: Well developed/well nourished HEAD: Normocephalic/atraumatic EYES: EOMI/PERRL ENMT: Mucous membranes moist NECK: supple no meningeal signs SPINE:entire spine nontender CV: S1/S2 noted, no murmurs/rubs/gallops noted LUNGS: Lungs are clear to auscultation bilaterally, no apparent distress ABDOMEN: soft, nontender, no rebound or guarding, obese, tenderness to surgical scar under pannus. No cellulitis or abscess noted.  No crepitus noted GU:no cva  tenderness NEURO: Pt is awake/alert, moves all extremitiesx4 EXTREMITIES: pulses normal, full ROM SKIN: warm, color normal PSYCH: no abnormalities of mood noted   ED Course  Procedures  DIAGNOSTIC STUDIES: Oxygen Saturation is 100% on RA, normal by my interpretation.    COORDINATION OF CARE: 5:39 PM- Discussed plans to order diagnostic lab work and urinalysis. Will give patient oxycodone for pain management. Pt advised of plan  for treatment and pt agrees. 7:38 PM Pt with chronic abd/pelvic pain Well appearing, watching TV, no distress, using phone I doubt acute abd/pelvic emergency Discussed need for GYN followup   Labs Review Labs Reviewed  COMPREHENSIVE METABOLIC PANEL - Abnormal; Notable for the following:    Glucose, Bld 109 (*)    Anion gap 18 (*)    All other components within normal limits  CBC WITH DIFFERENTIAL - Abnormal; Notable for the following:    WBC 12.5 (*)    Neutro Abs 8.6 (*)    All other components within normal limits  URINALYSIS, ROUTINE W REFLEX MICROSCOPIC  POC URINE PREG, ED    MDM   Final diagnoses:  Chronic abdominal pain    Nursing notes including past medical history and social history reviewed and considered in documentation Labs/vital reviewed and considered Previous records reviewed and considered   I personally performed the services described in this documentation, which was scribed in my presence. The recorded information has been reviewed and is accurate.      Sharyon Cable, MD 05/15/14 [phone]

## 2014-05-15 NOTE — ED Notes (Signed)
Pt reports abdominal pain,migraine, and lower pelvic pain for several months but reports increased in pain x2 days. Pt reports nausea but denies v/d.

## 2014-05-15 NOTE — Discharge Instructions (Signed)

## 2014-06-09 ENCOUNTER — Ambulatory Visit (INDEPENDENT_AMBULATORY_CARE_PROVIDER_SITE_OTHER): Payer: Medicaid Other | Admitting: Obstetrics and Gynecology

## 2014-06-09 ENCOUNTER — Encounter: Payer: Self-pay | Admitting: Obstetrics and Gynecology

## 2014-06-09 VITALS — BP 122/70 | Ht 65.0 in | Wt 310.5 lb

## 2014-06-09 DIAGNOSIS — N806 Endometriosis in cutaneous scar: Secondary | ICD-10-CM

## 2014-06-09 DIAGNOSIS — Z6841 Body Mass Index (BMI) 40.0 and over, adult: Secondary | ICD-10-CM

## 2014-06-09 LAB — COMPLETE METABOLIC PANEL WITH GFR
ALT: 29 U/L (ref 0–35)
AST: 20 U/L (ref 0–37)
Albumin: 4.2 g/dL (ref 3.5–5.2)
Alkaline Phosphatase: 72 U/L (ref 39–117)
BILIRUBIN TOTAL: 0.4 mg/dL (ref 0.2–1.2)
BUN: 9 mg/dL (ref 6–23)
CO2: 22 meq/L (ref 19–32)
CREATININE: 0.59 mg/dL (ref 0.50–1.10)
Calcium: 9.2 mg/dL (ref 8.4–10.5)
Chloride: 102 mEq/L (ref 96–112)
Glucose, Bld: 155 mg/dL — ABNORMAL HIGH (ref 70–99)
Potassium: 3.9 mEq/L (ref 3.5–5.3)
Sodium: 135 mEq/L (ref 135–145)
Total Protein: 6.9 g/dL (ref 6.0–8.3)

## 2014-06-09 LAB — CBC
HCT: 40.3 % (ref 36.0–46.0)
Hemoglobin: 13.8 g/dL (ref 12.0–15.0)
MCH: 27.6 pg (ref 26.0–34.0)
MCHC: 34.2 g/dL (ref 30.0–36.0)
MCV: 80.6 fL (ref 78.0–100.0)
Platelets: 249 10*3/uL (ref 150–400)
RBC: 5 MIL/uL (ref 3.87–5.11)
RDW: 14.7 % (ref 11.5–15.5)
WBC: 11.5 10*3/uL — ABNORMAL HIGH (ref 4.0–10.5)

## 2014-06-09 LAB — TSH: TSH: 1.425 u[IU]/mL (ref 0.350–4.500)

## 2014-06-09 MED ORDER — HYDROCODONE-ACETAMINOPHEN 5-325 MG PO TABS: 1.0000 | ORAL_TABLET | Freq: Four times a day (QID) | ORAL | Status: DC | PRN

## 2014-06-09 NOTE — Patient Instructions (Signed)
Food calendar

## 2014-06-09 NOTE — Progress Notes (Signed)
This chart was scribed by Ludger Nutting, Medical Scribe, for Dr. Mallory Shirk on 06/09/14 at 10:16 AM. This chart was reviewed by Dr. Mallory Shirk for accuracy.   [facility] Visit  Patient name: Patricia Stanton MRN [id number]  Date of birth: 09/24/1985  CC & HPI:  Maurita Havener is a G1P1 28 y.o. female presenting today for increased pain related to menstrual period. She reports having right sided abdominal pain and cramping that begins 2 days prior and 3 days after menstrual period. She reports being in pain for a total of 10 days during each cycle. Patient states she has been seen in the ED multiple times for similar symptoms. She occasionally uses Midol with minimal relief. Patient states she has been prescribed narcotic pain medication through the ED but states she wasn't given any prescriptions during her last visit. LMP 05/27/14.   ROS:  +right sided abdominal pain and "knot"   Pertinent History Reviewed:   Reviewed: Significant for cesarean section in 2008, BTL October 2010 Medical         Past Medical History  Diagnosis Date  . Anxiety   . Obesity   . Tobacco abuse   . Ovarian cyst   . Endometriosis   . Chronic abdominal pain   . Chronic headaches                               Surgical Hx:    Past Surgical History  Procedure Laterality Date  . Femur fracture surgery    . Knee arthroscopy    . Tubal ligation    . Cholecystectomy     Medications: Reviewed & Updated - see associated section                      No current outpatient prescriptions on file.   Social History: Reviewed -  reports that she has been smoking Cigarettes.  She has a 5.5 pack-year smoking history. She has never used smokeless tobacco.  Objective Findings:  Vitals: Blood pressure 122/70, height 5\' 5"  (1.651 m), weight 310 lb 8 oz (140.842 kg), last menstrual period 05/27/2014.  Physical Examination: General appearance - alert, well appearing, and in no distress and oriented to person,  place, and time Mental status - alert, oriented to person, place, and time, normal mood, behavior, speech, dress, motor activity, and thought processes Abdomen - scars from previous incisions beneath a huge pannus, lower abd transverse incision, with firm 3 cm area of fibrosis at right edge of incision, which is the area described as swelling to fruit-sized area with menses, and becoming dark . Retracted and tender, rest of incision is nontender Pelvic -  VULVA: normal appearing vulva with no masses, tenderness or lesions,  VAGINA: normal appearing vagina with normal color and discharge, no lesions,  CERVIX: normal appearing cervix without discharge or lesions,  UTERUS: uterus is normal size, shape, consistency and nontender, exam limited by habitus,  ADNEXA: normal adnexa in size, nontender and no masses, CUL DE SAC HAS SMALL FIRM NODULE, 1 CM WITH ACUTE PAIN ON CONTACT.   Assessment & Plan:   A:  1. Endometriosis OF Incision, also suspected in cul de sac. 2 morbid obesity P:  1.  2. Follow up on +/- October 10th for exam at time of menses 3. Consider laparoscopy and concurrent excision of endometriosis of incision  4. Consider lupron supression preop and weight loss efforts.

## 2014-06-09 NOTE — Progress Notes (Signed)
Patient ID: Patricia Stanton, female   DOB: 09-28-1985, 28 y.o.   MRN: [id number] Pt here today for follow up from ED visit. Pt states that she is having lower abdominal pain. Pt states that she has been seen by Dr. Evie Lacks, pt states that she has been going through this for a little while and just wants some answers. Pt states that she was told at the ED that she has endometriosis that was seen on CT scan.

## 2014-06-10 ENCOUNTER — Telehealth: Payer: Self-pay | Admitting: Obstetrics and Gynecology

## 2014-06-10 LAB — SEDIMENTATION RATE: Sed Rate: 5 mm/hr (ref 0–22)

## 2014-06-10 NOTE — Telephone Encounter (Signed)
Pt aware of results and requests a fluid pill. I advised the pt that I would send this message to Dr. Glo Herring and for her to check back with her pharmacy.

## 2014-06-13 ENCOUNTER — Telehealth: Payer: Self-pay | Admitting: Obstetrics and Gynecology

## 2014-06-13 NOTE — Telephone Encounter (Signed)
l left a message that pt can discuss desire for fluid pill with me at her next appt.

## 2014-06-13 NOTE — Telephone Encounter (Signed)
Pt is requesting a fluid pill!

## 2014-06-24 ENCOUNTER — Ambulatory Visit: Payer: Medicaid Other | Admitting: Obstetrics and Gynecology

## 2014-06-27 ENCOUNTER — Encounter: Payer: Self-pay | Admitting: Obstetrics and Gynecology

## 2014-06-27 ENCOUNTER — Ambulatory Visit (INDEPENDENT_AMBULATORY_CARE_PROVIDER_SITE_OTHER): Payer: Medicaid Other | Admitting: Obstetrics and Gynecology

## 2014-06-27 VITALS — BP 128/82 | Ht 65.0 in | Wt 313.5 lb

## 2014-06-27 DIAGNOSIS — F419 Anxiety disorder, unspecified: Secondary | ICD-10-CM

## 2014-06-27 DIAGNOSIS — F430 Acute stress reaction: Principal | ICD-10-CM | POA: Insufficient documentation

## 2014-06-27 DIAGNOSIS — F411 Generalized anxiety disorder: Secondary | ICD-10-CM

## 2014-06-27 MED ORDER — LEUPROLIDE ACETATE 3.75 MG IM KIT: 11.2500 mg | PACK | Freq: Once | INTRAMUSCULAR | Status: DC

## 2014-06-27 MED ORDER — HYDROCODONE-ACETAMINOPHEN 5-325 MG PO TABS: 1.0000 | ORAL_TABLET | Freq: Four times a day (QID) | ORAL | Status: DC | PRN

## 2014-06-27 NOTE — Progress Notes (Signed)
Patient ID: Patricia Stanton, female   DOB: 02-06-1986, 28 y.o.   MRN: [id number] Patricia Stanton here today for follow up from last visit. Patricia Stanton supposed to have an exam while on menses, but pot has not started her period yet this month. Patricia Stanton states that she is still having the same pain, Patricia Stanton rates her pain a 7-8 on a scale form 0-10 right now.

## 2014-06-27 NOTE — Progress Notes (Addendum)
Patient ID: Patricia Stanton, female   DOB: May 18, 1986, 28 y.o.   MRN: [id number]   [facility] Visit  Patient name: Patricia Stanton MRN [id number]  Date of birth: 05-07-1986  CC & HPI:  Patricia Stanton is a 28 y.o. female presenting today for a follow-up visit regarding endometriosis.  She is still experiencing severe abdominal pain but is asymptomatic currently.  At her last visit she was advised to return for another examination while on her menses however, she has been under a lot of stress lately and her menses has not started as it should.  She only experiences irregularities in her menses when she is stressed out.  Her partner states that both he and the patient have been experiencing a lot of family problems which is causing them the most stress.  Her LNMP was this time last month.  Her last bowel movement was this morning.  She has a history of tubal ligation.   ROS:  All systems have been reviewed and are negative unless otherwise specified in the HPI.   Pertinent History Reviewed:   Reviewed: Significant for  Medical         Past Medical History  Diagnosis Date  . Anxiety   . Obesity   . Tobacco abuse   . Ovarian cyst   . Endometriosis   . Chronic abdominal pain   . Chronic headaches                               Surgical Hx:    Past Surgical History  Procedure Laterality Date  . Femur fracture surgery    . Knee arthroscopy    . Tubal ligation    . Cholecystectomy     Medications: Reviewed & Updated - see associated section                      Current outpatient prescriptions:HYDROcodone-acetaminophen (NORCO/VICODIN) 5-325 MG per tablet, Take 1 tablet by mouth every 6 (six) hours as needed., Disp: 30 tablet, Rfl: 0   Social History: Reviewed -  reports that she has been smoking Cigarettes.  She has a 5.5 pack-year smoking history. She has never used smokeless tobacco.  Objective Findings:  Vitals: Blood pressure 128/82, height 5\' 5"  (1.651 m), weight 313 lb 8  oz (142.203 kg), last menstrual period 05/27/2014.  Physical Examination: General appearance - alert, well appearing, and in no distress, oriented to person, place, and time and overweight Abd incision fibrotic area on right side. unchanged Pelvic - VULVA: normal appearing vulva with no masses, tenderness or lesions,  VAGINA: normal appearing vagina with normal color and discharge, no lesions, mucous is still clear but thick CERVIX: normal appearing cervix without discharge or lesions,  UTERUS: uterus is normal size, shape, consistency and nontender,  ADNEXA: cul de sac nodule still present.   Assessment & Plan:   A:  1. Endometriosis  2. Amenorrhea  3. Morbid obesity 4.chronic pain from endometriosis 5.  P:  1. Lupron injection pt to call and let us know when appt arrives. 2. Follow-up in 3 months  3 renew [facility] 5/325 pt told to expect persistent pain,     This chart was scribed for Jonnie Kind, MD by Donato Schultz, ED Scribe. This patient was seen in Room 1 and the patient's care was started at 9:52 AM.

## 2014-07-12 ENCOUNTER — Telehealth: Payer: Self-pay | Admitting: *Deleted

## 2014-07-12 NOTE — Telephone Encounter (Signed)
Pt wanted to know her hormone levels, pt wanted cortisone level. Pt was advised that she had not had that blood work done here before. Pt stated that she has an appointment here Thursday to see Dr. Glo Herring and wanted him to check those levels for her. I advised the pt to make sure she mentions it to Dr. Glo Herring and that I had also put a note in her chart for it as well. Pt verbalized understanding.

## 2014-07-14 ENCOUNTER — Encounter: Payer: Self-pay | Admitting: Obstetrics and Gynecology

## 2014-07-14 ENCOUNTER — Ambulatory Visit (INDEPENDENT_AMBULATORY_CARE_PROVIDER_SITE_OTHER): Payer: Medicaid Other | Admitting: Obstetrics and Gynecology

## 2014-07-14 VITALS — BP 118/70 | Ht 65.0 in | Wt 313.0 lb

## 2014-07-14 DIAGNOSIS — R102 Pelvic and perineal pain: Secondary | ICD-10-CM

## 2014-07-14 DIAGNOSIS — Z3200 Encounter for pregnancy test, result unknown: Secondary | ICD-10-CM

## 2014-07-14 DIAGNOSIS — N808 Other endometriosis: Secondary | ICD-10-CM

## 2014-07-14 DIAGNOSIS — Z3202 Encounter for pregnancy test, result negative: Secondary | ICD-10-CM

## 2014-07-14 LAB — POCT URINE PREGNANCY: PREG TEST UR: NEGATIVE

## 2014-07-14 MED ORDER — NORETHINDRONE ACETATE 5 MG PO TABS: 5.0000 mg | ORAL_TABLET | Freq: Every day | ORAL | Status: DC

## 2014-07-14 NOTE — Progress Notes (Signed)
Patient ID: Patricia Stanton, female   DOB: 1986-07-18, 28 y.o.   MRN: [id number] Pt here today for Lupron injection and to discuss next step. Pt has some questions about the injection and other questions about things. Pt wants answers and wants to discuss getting a tubal reversal. Pt wants to discuss these issues before getting the injection.

## 2014-07-14 NOTE — Progress Notes (Signed)
Patient ID: Patricia Stanton, female   DOB: September 19, 1985, 28 y.o.   MRN: [id number]   [facility] Visit  Patient name: Patricia Stanton MRN [id number]  Date of birth: [birth year]  CC & HPI:  Patricia Stanton is a 28 y.o. female presenting today to discuss a Lupron injection.  She would also like to discuss getting a tubal ligation reversal once she's lost weight. Pt with multiple concerns, weight , abd pain, moodiness that she is afraid will be increased. Lengthy discussion of realistic results of lupron.     ROS:  All systems are reviewed and are negative unless otherwise specified in the HPI.   Pertinent History Reviewed:   Reviewed: Significant for  Medical         Past Medical History  Diagnosis Date   Anxiety    Obesity    Tobacco abuse    Ovarian cyst    Endometriosis    Chronic abdominal pain    Chronic headaches                               Surgical Hx:    Past Surgical History  Procedure Laterality Date   Femur fracture surgery     Knee arthroscopy     Tubal ligation     Cholecystectomy     Medications: Reviewed & Updated - see associated section                      Current outpatient prescriptions:HYDROcodone-acetaminophen (NORCO/VICODIN) 5-325 MG per tablet, Take 1 tablet by mouth every 6 (six) hours as needed., Disp: 30 tablet, Rfl: 0;  leuprolide (LUPRON) 3.75 MG injection, Inject 11.25 mg into the muscle once., Disp: 1 kit, Rfl: 1   Social History: Reviewed -  reports that she has been smoking Cigarettes.  She has a 5.5 pack-year smoking history. She has never used smokeless tobacco.  Objective Findings:  Vitals: Blood pressure 118/70, height _0  (1.651 m), weight 313 lb (141.976 kg), last menstrual period 07/06/2014.  Physical Examination: Lengthy discussion of weight control and Lupron injection side effects.   Assessment & Plan:   A:  1.pelvic pain. 2. Endometriosis  Plan:  1. Pt to get lupron shot 2. Aygestin addback  P:  1.  Proceed with Lupron shot 2. Weight loss effort, refer to dietitian 3. Follow-up in 3 months  This chart was scribed for Jonnie Kind, MD by Donato Schultz, ED Scribe. This patient was seen in Room 1 and the patient's care was started at 9:22 AM.

## 2014-07-25 ENCOUNTER — Telehealth: Payer: Self-pay | Admitting: *Deleted

## 2014-07-25 NOTE — Telephone Encounter (Signed)
Pt states that she got a Lupron on 07/14/14 and now has started bleeding today. Pt states that the bleeding is very light.   I spoke with Dr. Glo Herring and he advised that is the bleeding was like a period or lighter not to worry about it. I advised the pt of this and she verbalized understanding.

## 2014-09-17 ENCOUNTER — Emergency Department (HOSPITAL_COMMUNITY): Payer: Medicaid Other

## 2014-09-17 ENCOUNTER — Encounter (HOSPITAL_COMMUNITY): Payer: Self-pay

## 2014-09-17 ENCOUNTER — Emergency Department (HOSPITAL_COMMUNITY)
Admission: EM | Admit: 2014-09-17 | Discharge: 2014-09-17 | Discharge status: Against Medical Advice | Payer: Medicaid Other | Attending: Emergency Medicine | Admitting: Emergency Medicine

## 2014-09-17 DIAGNOSIS — G43009 Migraine without aura, not intractable, without status migrainosus: Secondary | ICD-10-CM

## 2014-09-17 DIAGNOSIS — Z8742 Personal history of other diseases of the female genital tract: Secondary | ICD-10-CM | POA: Insufficient documentation

## 2014-09-17 DIAGNOSIS — Z793 Long term (current) use of hormonal contraceptives: Secondary | ICD-10-CM | POA: Insufficient documentation

## 2014-09-17 DIAGNOSIS — R000 Tachycardia, unspecified: Secondary | ICD-10-CM | POA: Insufficient documentation

## 2014-09-17 DIAGNOSIS — Z8659 Personal history of other mental and behavioral disorders: Secondary | ICD-10-CM | POA: Diagnosis not present

## 2014-09-17 DIAGNOSIS — Z720 Tobacco use: Secondary | ICD-10-CM | POA: Diagnosis not present

## 2014-09-17 DIAGNOSIS — G8929 Other chronic pain: Secondary | ICD-10-CM | POA: Insufficient documentation

## 2014-09-17 DIAGNOSIS — G43909 Migraine, unspecified, not intractable, without status migrainosus: Secondary | ICD-10-CM | POA: Insufficient documentation

## 2014-09-17 DIAGNOSIS — R0602 Shortness of breath: Secondary | ICD-10-CM | POA: Diagnosis not present

## 2014-09-17 DIAGNOSIS — E669 Obesity, unspecified: Secondary | ICD-10-CM | POA: Insufficient documentation

## 2014-09-17 DIAGNOSIS — R079 Chest pain, unspecified: Secondary | ICD-10-CM | POA: Diagnosis present

## 2014-09-17 LAB — CBC WITH DIFFERENTIAL/PLATELET
BASOS ABS: 0 10*3/uL (ref 0.0–0.1)
Basophils Relative: 0 % (ref 0–1)
EOS ABS: 0.1 10*3/uL (ref 0.0–0.7)
Eosinophils Relative: 1 % (ref 0–5)
HCT: 36.3 % (ref 36.0–46.0)
Hemoglobin: 12.1 g/dL (ref 12.0–15.0)
LYMPHS ABS: 2.5 10*3/uL (ref 0.7–4.0)
LYMPHS PCT: 23 % (ref 12–46)
MCH: 27.5 pg (ref 26.0–34.0)
MCHC: 33.3 g/dL (ref 30.0–36.0)
MCV: 82.5 fL (ref 78.0–100.0)
MONO ABS: 0.6 10*3/uL (ref 0.1–1.0)
Monocytes Relative: 6 % (ref 3–12)
Neutro Abs: 7.6 10*3/uL (ref 1.7–7.7)
Neutrophils Relative %: 70 % (ref 43–77)
PLATELETS: 232 10*3/uL (ref 150–400)
RBC: 4.4 MIL/uL (ref 3.87–5.11)
RDW: 14.4 % (ref 11.5–15.5)
WBC: 10.8 10*3/uL — ABNORMAL HIGH (ref 4.0–10.5)

## 2014-09-17 LAB — COMPREHENSIVE METABOLIC PANEL
ALT: 27 U/L (ref 0–35)
AST: 24 U/L (ref 0–37)
Albumin: 3.6 g/dL (ref 3.5–5.2)
Alkaline Phosphatase: 77 U/L (ref 39–117)
Anion gap: 6 (ref 5–15)
BILIRUBIN TOTAL: 0.4 mg/dL (ref 0.3–1.2)
BUN: 9 mg/dL (ref 6–23)
CO2: 24 mmol/L (ref 19–32)
Calcium: 8.5 mg/dL (ref 8.4–10.5)
Chloride: 109 mEq/L (ref 96–112)
Creatinine, Ser: 0.68 mg/dL (ref 0.50–1.10)
GFR calc non Af Amer: >90 mL/min (ref >90)
Glucose, Bld: 156 mg/dL — ABNORMAL HIGH (ref 70–99)
Potassium: 3.8 mmol/L (ref 3.5–5.1)
Sodium: 139 mmol/L (ref 135–145)
TOTAL PROTEIN: 6.4 g/dL (ref 6.0–8.3)

## 2014-09-17 LAB — TROPONIN I

## 2014-09-17 LAB — D-DIMER, QUANTITATIVE: D-Dimer, Quant: 0.34 ug/mL-FEU (ref 0.00–0.48)

## 2014-09-17 MED ORDER — SODIUM CHLORIDE 0.9 % IV BOLUS (SEPSIS)
1000.0000 mL | Freq: Once | INTRAVENOUS | Status: AC
  Administered 2014-09-17: 1000 mL via INTRAVENOUS

## 2014-09-17 MED ORDER — DIPHENHYDRAMINE HCL 50 MG/ML IJ SOLN
50.0000 mg | Freq: Once | INTRAMUSCULAR | Status: AC
  Administered 2014-09-17: 50 mg via INTRAVENOUS
  Filled 2014-09-17: qty 1

## 2014-09-17 MED ORDER — METOCLOPRAMIDE HCL 5 MG/ML IJ SOLN
10.0000 mg | Freq: Once | INTRAMUSCULAR | Status: AC
  Administered 2014-09-17: 10 mg via INTRAVENOUS
  Filled 2014-09-17: qty 2

## 2014-09-17 MED ORDER — KETOROLAC TROMETHAMINE 30 MG/ML IJ SOLN
30.0000 mg | Freq: Once | INTRAMUSCULAR | Status: AC
  Administered 2014-09-17: 30 mg via INTRAVENOUS
  Filled 2014-09-17: qty 1

## 2014-09-17 NOTE — ED Provider Notes (Signed)
TIME SEEN: 5:15 PM  CHIEF COMPLAINT: Headache, chest pain, abdominal pain  HPI: Pt is a 29 y.o. F with history of obesity, tobacco use, endometriosis, migraine headaches who presents the emergency department with multiple complaints.  Patient reports that she has had a right-sided migraine headache for several days. She is also complaining of pleuritic chest pain is worse with exertion and deep breath is been constant for the past week. No fevers or cough. She also feels that her abdomen is swollen and is concerned that there may be "fluid on my belly". Denies any nausea, vomiting or diarrhea. Has had some shortness of breath with her chest pain. Chest pain is described as a tightness. No history of hypertension, diabetes, hyperlipidemia but she is a smoker. She is also on birth control. No prior history of PE or DVT. No lower extreme swelling or pain. No dysuria or hematuria. No vaginal bleeding or discharge.  ROS: See HPI Constitutional: no fever  Eyes: no drainage  ENT: no runny nose   Cardiovascular:  no chest pain  Resp: no SOB  GI: no vomiting GU: no dysuria Integumentary: no rash  Allergy: no hives  Musculoskeletal: no leg swelling  Neurological: no slurred speech ROS otherwise negative  PAST MEDICAL HISTORY/PAST SURGICAL HISTORY:  Past Medical History  Diagnosis Date  . Anxiety   . Obesity   . Tobacco abuse   . Ovarian cyst   . Endometriosis   . Chronic abdominal pain   . Chronic headaches     MEDICATIONS:  Prior to Admission medications   Medication Sig Start Date End Date Taking? Authorizing Provider  HYDROcodone-acetaminophen (NORCO/VICODIN) 5-325 MG per tablet Take 1 tablet by mouth every 6 (six) hours as needed. 06/27/14   Jonnie Kind, MD  leuprolide (LUPRON) 3.75 MG injection Inject 11.25 mg into the muscle once. 06/27/14   Jonnie Kind, MD  norethindrone (AYGESTIN) 5 MG tablet Take 1 tablet (5 mg total) by mouth daily. 07/14/14   Jonnie Kind, MD     ALLERGIES:  No Known Allergies  SOCIAL HISTORY:  History  Substance Use Topics  . Smoking status: Current Every Day Smoker -- 0.50 packs/day for 11 years    Types: Cigarettes  . Smokeless tobacco: Never Used  . Alcohol Use: No    FAMILY HISTORY: Family History  Problem Relation Age of Onset  . Hypertension Mother   . Hyperlipidemia Mother   . Fibromyalgia Mother   . Other Mother     degenerative disc disease  . Diabetes Father   . Hypertension Father   . Hyperlipidemia Father   . Heart disease Father   . Heart attack Father   . Stroke Father   . Hyperlipidemia Brother   . Hypertension Brother     EXAM: BP 112/93 mmHg  Pulse 102  Temp(Src) 98.4 F (36.9 C) (Oral)  Resp 16  SpO2 99%  LMP 06/17/2014 CONSTITUTIONAL: Alert and oriented and responds appropriately to questions. Well-appearing; well-nourished HEAD: Normocephalic EYES: Conjunctivae clear, PERRL ENT: normal nose; no rhinorrhea; moist mucous membranes; pharynx without lesions noted NECK: Supple, no meningismus, no LAD  CARD: RRR; S1 and S2 appreciated; no murmurs, no clicks, no rubs, no gallops RESP: Normal chest excursion without splinting or tachypnea; breath sounds clear and equal bilaterally; no wheezes, no rhonchi, no rales, no hypoxia or respiratory distress, spell sentences ABD/GI: Normal bowel sounds; non-distended; soft, non-tender, no rebound, no guarding; obese, no tympany or fluid wave BACK:  The back appears normal  and is non-tender to palpation, there is no CVA tenderness EXT: Normal ROM in all joints; non-tender to palpation; no edema; normal capillary refill; no cyanosis; no Tenderness or swelling SKIN: Normal color for age and race; warm NEURO: Moves all extremities equally; sensation to light touch intact diffusely, cranial nerves II through XII intact, normal gait PSYCH: The patient's mood and manner are appropriate. Grooming and personal hygiene are appropriate.  MEDICAL DECISION  MAKING: Pt here with chest pain and shortness of breath. She has a heart score of 1. EKG is nonischemic. She is however mildly tachycardic and is on birth control and smokes. Concern for possible PE. We'll obtain d-dimer. Will obtain chest x-ray. She is also complaining of a migraine headache. We'll treat with Toradol, Reglan, IV fluids, Benadryl. She is neurologically intact and describes some headaches in the past. I do not feel she needs a head CT at this time.  Patient also complaining of abdominal pain and feeling like she has fluid in her abdomen. There is no fluid wave on exam. No history of liver failure, CHF. Her abdominal exam is benign. I do not feel she needs abdominal imaging.  ED PROGRESS: Patient's labs are unremarkable. Troponin negative. D-dimer negative. Chest x-ray clear. When I went to back to reassess the patient nursing staff that meter that the patient left [place] without telling anyone she was leaving. She removed her own PIV.  Unable to give her discharge instructions or discuss her results.    EKG Interpretation  Date/Time:  Saturday September 17 2014 15:17:38 EST Ventricular Rate:  101 PR Interval:  150 QRS Duration: 78 QT Interval:  340 QTC Calculation: 440 R Axis:   48 Text Interpretation:  Sinus tachycardia Otherwise normal ECG No old tracing to compare Confirmed by [facility]  MD, ELLIOTT [id number]) on 09/17/2014 3:27:46 PM        Richardson, DO 09/17/14 2034

## 2014-09-17 NOTE — ED Notes (Signed)
Complain of generalized chest pain that started three days ago. Also, complain of nausea

## 2014-09-17 NOTE — ED Notes (Signed)
Went in to reevaluate pt's HA, pt and pt's husband no longer in room, pt had pulled IV out and found on bedside table, EDP made aware, pt did not inform this nurse of leaving

## 2014-10-14 ENCOUNTER — Ambulatory Visit: Payer: Medicaid Other | Admitting: Obstetrics and Gynecology

## 2014-10-14 ENCOUNTER — Encounter: Payer: Self-pay | Admitting: Obstetrics and Gynecology

## 2014-11-16 ENCOUNTER — Encounter (HOSPITAL_COMMUNITY): Payer: Self-pay | Admitting: Emergency Medicine

## 2014-11-16 ENCOUNTER — Emergency Department (HOSPITAL_COMMUNITY)
Admission: EM | Admit: 2014-11-16 | Discharge: 2014-11-16 | Disposition: A | Discharge status: Home / Self Care | Payer: Medicaid Other | Attending: Emergency Medicine | Admitting: Emergency Medicine

## 2014-11-16 DIAGNOSIS — Z3202 Encounter for pregnancy test, result negative: Secondary | ICD-10-CM | POA: Insufficient documentation

## 2014-11-16 DIAGNOSIS — R05 Cough: Secondary | ICD-10-CM | POA: Diagnosis not present

## 2014-11-16 DIAGNOSIS — R110 Nausea: Secondary | ICD-10-CM | POA: Insufficient documentation

## 2014-11-16 DIAGNOSIS — Z8659 Personal history of other mental and behavioral disorders: Secondary | ICD-10-CM | POA: Diagnosis not present

## 2014-11-16 DIAGNOSIS — R143 Flatulence: Secondary | ICD-10-CM | POA: Diagnosis not present

## 2014-11-16 DIAGNOSIS — Z720 Tobacco use: Secondary | ICD-10-CM | POA: Insufficient documentation

## 2014-11-16 DIAGNOSIS — K5900 Constipation, unspecified: Secondary | ICD-10-CM | POA: Insufficient documentation

## 2014-11-16 DIAGNOSIS — R630 Anorexia: Secondary | ICD-10-CM | POA: Insufficient documentation

## 2014-11-16 DIAGNOSIS — Z793 Long term (current) use of hormonal contraceptives: Secondary | ICD-10-CM | POA: Insufficient documentation

## 2014-11-16 DIAGNOSIS — Z9049 Acquired absence of other specified parts of digestive tract: Secondary | ICD-10-CM | POA: Insufficient documentation

## 2014-11-16 DIAGNOSIS — R000 Tachycardia, unspecified: Secondary | ICD-10-CM | POA: Insufficient documentation

## 2014-11-16 DIAGNOSIS — Z8742 Personal history of other diseases of the female genital tract: Secondary | ICD-10-CM | POA: Insufficient documentation

## 2014-11-16 DIAGNOSIS — R1011 Right upper quadrant pain: Secondary | ICD-10-CM | POA: Insufficient documentation

## 2014-11-16 DIAGNOSIS — R6883 Chills (without fever): Secondary | ICD-10-CM | POA: Diagnosis not present

## 2014-11-16 DIAGNOSIS — G8929 Other chronic pain: Secondary | ICD-10-CM | POA: Diagnosis not present

## 2014-11-16 DIAGNOSIS — Z9851 Tubal ligation status: Secondary | ICD-10-CM | POA: Insufficient documentation

## 2014-11-16 LAB — PREGNANCY, URINE: Preg Test, Ur: NEGATIVE

## 2014-11-16 LAB — URINALYSIS, ROUTINE W REFLEX MICROSCOPIC
BILIRUBIN URINE: NEGATIVE
GLUCOSE, UA: NEGATIVE mg/dL
KETONES UR: NEGATIVE mg/dL
Leukocytes, UA: NEGATIVE
Nitrite: NEGATIVE
Protein, ur: NEGATIVE mg/dL
Specific Gravity, Urine: >1.03 — ABNORMAL HIGH (ref 1.005–1.030)
Urobilinogen, UA: 0.2 mg/dL (ref 0.0–1.0)
pH: 5.5 (ref 5.0–8.0)

## 2014-11-16 LAB — CBC WITH DIFFERENTIAL/PLATELET
BASOS ABS: 0 10*3/uL (ref 0.0–0.1)
BASOS PCT: 0 % (ref 0–1)
EOS PCT: 1 % (ref 0–5)
Eosinophils Absolute: 0.1 10*3/uL (ref 0.0–0.7)
HEMATOCRIT: 42.3 % (ref 36.0–46.0)
Hemoglobin: 14.3 g/dL (ref 12.0–15.0)
LYMPHS PCT: 20 % (ref 12–46)
Lymphs Abs: 3.2 10*3/uL (ref 0.7–4.0)
MCH: 27.9 pg (ref 26.0–34.0)
MCHC: 33.8 g/dL (ref 30.0–36.0)
MCV: 82.6 fL (ref 78.0–100.0)
Monocytes Absolute: 1 10*3/uL (ref 0.1–1.0)
Monocytes Relative: 6 % (ref 3–12)
NEUTROS ABS: 11.8 10*3/uL — AB (ref 1.7–7.7)
Neutrophils Relative %: 73 % (ref 43–77)
Platelets: 263 10*3/uL (ref 150–400)
RBC: 5.12 MIL/uL — ABNORMAL HIGH (ref 3.87–5.11)
RDW: 14 % (ref 11.5–15.5)
WBC: 16.1 10*3/uL — ABNORMAL HIGH (ref 4.0–10.5)

## 2014-11-16 LAB — URINE MICROSCOPIC-ADD ON

## 2014-11-16 LAB — COMPREHENSIVE METABOLIC PANEL
ALK PHOS: 77 U/L (ref 39–117)
ALT: 28 U/L (ref 0–35)
AST: 23 U/L (ref 0–37)
Albumin: 4 g/dL (ref 3.5–5.2)
Anion gap: 9 (ref 5–15)
BUN: 7 mg/dL (ref 6–23)
CALCIUM: 9.1 mg/dL (ref 8.4–10.5)
CO2: 24 mmol/L (ref 19–32)
Chloride: 104 mmol/L (ref 96–112)
Creatinine, Ser: 0.67 mg/dL (ref 0.50–1.10)
GFR calc Af Amer: >90 mL/min (ref >90)
Glucose, Bld: 131 mg/dL — ABNORMAL HIGH (ref 70–99)
Potassium: 4 mmol/L (ref 3.5–5.1)
SODIUM: 137 mmol/L (ref 135–145)
TOTAL PROTEIN: 7.5 g/dL (ref 6.0–8.3)
Total Bilirubin: 0.2 mg/dL — ABNORMAL LOW (ref 0.3–1.2)

## 2014-11-16 LAB — LIPASE, BLOOD: Lipase: 23 U/L (ref 11–59)

## 2014-11-16 MED ORDER — OXYCODONE-ACETAMINOPHEN 5-325 MG PO TABS
2.0000 | ORAL_TABLET | ORAL | Status: DC | PRN
Start: 2014-11-16 — End: 2014-11-25

## 2014-11-16 MED ORDER — HYDROMORPHONE HCL 1 MG/ML IJ SOLN: 1.0000 mg | Freq: Once | INTRAMUSCULAR | Status: DC

## 2014-11-16 MED ORDER — ONDANSETRON 4 MG PO TBDP: 4.0000 mg | ORAL_TABLET | Freq: Three times a day (TID) | ORAL | Status: DC | PRN

## 2014-11-16 MED ORDER — ONDANSETRON 4 MG PO TBDP
4.0000 mg | ORAL_TABLET | Freq: Once | ORAL | Status: AC
  Administered 2014-11-16: 4 mg via ORAL
  Filled 2014-11-16: qty 1

## 2014-11-16 MED ORDER — SODIUM CHLORIDE 0.9 % IV SOLN: INTRAVENOUS | Status: DC

## 2014-11-16 NOTE — ED Provider Notes (Signed)
CSN: [id number]     Arrival date & time 11/16/14  1927 History   First MD Initiated Contact with Patient 11/16/14 1947     Chief Complaint  Patient presents with  . Abdominal Pain     (Consider location/radiation/quality/duration/timing/severity/associated sxs/prior Treatment) Patient is a 29 y.o. female presenting with abdominal pain. The history is provided by the patient. No language interpreter was used.  Abdominal Pain Pain location:  RUQ Pain quality: gnawing and squeezing   Pain radiates to:  Back Pain severity now: 8/10. Onset quality:  Sudden Duration:  1 day Timing:  Constant Progression:  Worsening Chronicity:  New Context: awakening from sleep   Relieved by:  Nothing Worsened by:  Eating Ineffective treatments:  Heat, lying down and cold Associated symptoms: anorexia, belching, chills, constipation, cough, flatus and nausea   Associated symptoms: no chest pain, no dysuria, no fever, no hematuria, no sore throat, no vaginal bleeding and no vaginal discharge    Patricia Stanton is a 29 y.o. female who presents to the ED with abdominal pain that started earlier today. She complains of nausea. The pain is located in the RUQ. Patient reports that she woke with the pain this morning. She ate BBQ last night about 6pm. She ate ice cream about mid day and the nausea got much worse.   Past Medical History  Diagnosis Date  . Anxiety   . Obesity   . Tobacco abuse   . Ovarian cyst   . Endometriosis   . Chronic abdominal pain   . Chronic headaches    Past Surgical History  Procedure Laterality Date  . Femur fracture surgery    . Knee arthroscopy    . Tubal ligation    . Cholecystectomy     Family History  Problem Relation Age of Onset  . Hypertension Mother   . Hyperlipidemia Mother   . Fibromyalgia Mother   . Other Mother     degenerative disc disease  . Diabetes Father   . Hypertension Father   . Hyperlipidemia Father   . Heart disease Father   . Heart attack  Father   . Stroke Father   . Hyperlipidemia Brother   . Hypertension Brother    History  Substance Use Topics  . Smoking status: Current Every Day Smoker -- 0.50 packs/day for 11 years    Types: Cigarettes  . Smokeless tobacco: Never Used  . Alcohol Use: No   OB History    No data available     Review of Systems  Constitutional: Positive for chills. Negative for fever.  HENT: Negative for sore throat.   Eyes: Negative for pain and visual disturbance.  Respiratory: Positive for cough.   Cardiovascular: Negative for chest pain and palpitations.  Gastrointestinal: Positive for nausea, abdominal pain, constipation, anorexia and flatus.  Genitourinary: Negative for dysuria, hematuria, vaginal bleeding, vaginal discharge and pelvic pain.  Skin: Negative for rash.  Neurological: Negative for light-headedness and headaches.  Psychiatric/Behavioral: Negative for confusion. The patient is not nervous/anxious.       Allergies  Review of patient's allergies indicates no known allergies.  Home Medications   Prior to Admission medications   Medication Sig Start Date End Date Taking? Authorizing Provider  leuprolide (LUPRON) 3.75 MG injection Inject 11.25 mg into the muscle once. 06/27/14   Jonnie Kind, MD  norethindrone (AYGESTIN) 5 MG tablet Take 1 tablet (5 mg total) by mouth daily. 07/14/14   Jonnie Kind, MD  ondansetron (ZOFRAN ODT) 4 MG  disintegrating tablet Take 1 tablet (4 mg total) by mouth every 8 (eight) hours as needed for nausea or vomiting. 11/16/14   Hope Bunnie Pion, NP  oxyCODONE-acetaminophen (PERCOCET/ROXICET) 5-325 MG per tablet Take 2 tablets by mouth every 4 (four) hours as needed for severe pain. 11/16/14   Hope Bunnie Pion, NP   BP 130/87 mmHg  Pulse 109  Temp(Src) 97.8 F (36.6 C) (Oral)  Resp 24  Ht 5\' 5"  (1.651 m)  Wt 321 lb 8 oz (145.831 kg)  BMI 53.50 kg/m2  SpO2 100%  LMP 11/16/2014 Physical Exam  Constitutional: She is oriented to person, place, and  time. No distress.  Morbidly obese  Eyes: EOM are normal.  Neck: Neck supple.  Cardiovascular: Regular rhythm.  Tachycardia present.   Pulmonary/Chest: Effort normal and breath sounds normal.  Abdominal: Soft. Bowel sounds are normal. There is tenderness in the right upper quadrant. There is no rebound, no guarding and no CVA tenderness.  obese  Musculoskeletal: Normal range of motion.  Neurological: She is alert and oriented to person, place, and time. No cranial nerve deficit.  Skin: Skin is warm and dry.  Psychiatric: She has a normal mood and affect. Her behavior is normal.  Nursing note and vitals reviewed.   ED Course  Procedures (including critical care time) Labs Review Results for orders placed or performed during the hospital encounter of 11/16/14 (from the past 24 hour(s))  Pregnancy, urine     Status: None   Collection Time: 11/16/14  7:50 PM  Result Value Ref Range   Preg Test, Ur NEGATIVE NEGATIVE  Urinalysis, Routine w reflex microscopic     Status: Abnormal   Collection Time: 11/16/14  7:50 PM  Result Value Ref Range   Color, Urine YELLOW YELLOW   APPearance CLEAR CLEAR   Specific Gravity, Urine >1.030 (H) 1.005 - 1.030   pH 5.5 5.0 - 8.0   Glucose, UA NEGATIVE NEGATIVE mg/dL   Hgb urine dipstick TRACE (A) NEGATIVE   Bilirubin Urine NEGATIVE NEGATIVE   Ketones, ur NEGATIVE NEGATIVE mg/dL   Protein, ur NEGATIVE NEGATIVE mg/dL   Urobilinogen, UA 0.2 0.0 - 1.0 mg/dL   Nitrite NEGATIVE NEGATIVE   Leukocytes, UA NEGATIVE NEGATIVE  Comprehensive metabolic panel     Status: Abnormal   Collection Time: 11/16/14  7:50 PM  Result Value Ref Range   Sodium 137 135 - 145 mmol/L   Potassium 4.0 3.5 - 5.1 mmol/L   Chloride 104 96 - 112 mmol/L   CO2 24 19 - 32 mmol/L   Glucose, Bld 131 (H) 70 - 99 mg/dL   BUN 7 6 - 23 mg/dL   Creatinine, Ser 0.67 0.50 - 1.10 mg/dL   Calcium 9.1 8.4 - 10.5 mg/dL   Total Protein 7.5 6.0 - 8.3 g/dL   Albumin 4.0 3.5 - 5.2 g/dL   AST 23  0 - 37 U/L   ALT 28 0 - 35 U/L   Alkaline Phosphatase 77 39 - 117 U/L   Total Bilirubin 0.2 (L) 0.3 - 1.2 mg/dL   GFR calc non Af Amer >90 >90 mL/min   GFR calc Af Amer >90 >90 mL/min   Anion gap 9 5 - 15  Lipase, blood     Status: None   Collection Time: 11/16/14  7:50 PM  Result Value Ref Range   Lipase 23 11 - 59 U/L  CBC with Differential     Status: Abnormal   Collection Time: 11/16/14  7:50 PM  Result Value Ref Range   WBC 16.1 (H) 4.0 - 10.5 K/uL   RBC 5.12 (H) 3.87 - 5.11 MIL/uL   Hemoglobin 14.3 12.0 - 15.0 g/dL   HCT 42.3 36.0 - 46.0 %   MCV 82.6 78.0 - 100.0 fL   MCH 27.9 26.0 - 34.0 pg   MCHC 33.8 30.0 - 36.0 g/dL   RDW 14.0 11.5 - 15.5 %   Platelets 263 150 - 400 K/uL   Neutrophils Relative % 73 43 - 77 %   Neutro Abs 11.8 (H) 1.7 - 7.7 K/uL   Lymphocytes Relative 20 12 - 46 %   Lymphs Abs 3.2 0.7 - 4.0 K/uL   Monocytes Relative 6 3 - 12 %   Monocytes Absolute 1.0 0.1 - 1.0 K/uL   Eosinophils Relative 1 0 - 5 %   Eosinophils Absolute 0.1 0.0 - 0.7 K/uL   Basophils Relative 0 0 - 1 %   Basophils Absolute 0.0 0.0 - 0.1 K/uL  Urine microscopic-add on     Status: Abnormal   Collection Time: 11/16/14  7:50 PM  Result Value Ref Range   Squamous Epithelial / LPF FEW (A) RARE   WBC, UA 0-2 <3 WBC/hpf   RBC / HPF 0-2 <3 RBC/hpf   Bacteria, UA RARE RARE     Discussed with Dr. Jeneen Rinks and will have patient return in the morning for ultrasound. Will treat for pain and nausea.   MDM  29 y.o. female with RUQ abdominal pain that woke her this am. Stable for d/c with medication for pain and nausea. Patient to return in the morning for abdominal ultrasound. She will return sooner for any problems.  Final diagnoses:  Right upper quadrant abdominal pain     [name], NP 11/16/14 2049  11/17/2014 at 1300:   Results of ultrasound discussed with patient and her husband. They understand she has cholelithiasis with chronic cholecystitis. Referral to general surgeon Dr.  Aviva Signs. No acute abdomen  Nat Christen, MD 11/17/14 1316

## 2014-11-16 NOTE — ED Notes (Signed)
Patient verbalizes understanding of discharge instructions, pain management, home care, and follow up appointment. Patient ambulatory out of department at this time with spouse.

## 2014-11-16 NOTE — ED Notes (Signed)
Patient states RUQ pain that started this morning. Patient states nausea denies vomiting

## 2014-11-16 NOTE — ED Notes (Signed)
Onset sharp pain in upper right quadrant, nausea, no vomiting.

## 2014-11-16 NOTE — ED Notes (Signed)
Patient states she does not want to have an IV at this time. Hope, NP notified at this time.

## 2014-11-17 ENCOUNTER — Ambulatory Visit (HOSPITAL_COMMUNITY)
Admit: 2014-11-17 | Discharge: 2014-11-17 | Disposition: A | Discharge status: Home / Self Care | Payer: Medicaid Other | Source: Ambulatory Visit | Attending: Emergency Medicine | Admitting: Emergency Medicine

## 2014-11-17 ENCOUNTER — Other Ambulatory Visit (HOSPITAL_COMMUNITY): Payer: Medicaid Other

## 2014-11-17 ENCOUNTER — Other Ambulatory Visit (HOSPITAL_COMMUNITY): Payer: Self-pay | Admitting: Nurse Practitioner

## 2014-11-17 DIAGNOSIS — R1011 Right upper quadrant pain: Secondary | ICD-10-CM | POA: Diagnosis present

## 2014-11-17 DIAGNOSIS — K8020 Calculus of gallbladder without cholecystitis without obstruction: Secondary | ICD-10-CM | POA: Diagnosis not present

## 2014-11-22 ENCOUNTER — Encounter (HOSPITAL_COMMUNITY): Payer: Self-pay

## 2014-11-23 ENCOUNTER — Encounter (HOSPITAL_COMMUNITY)
Admission: RE | Admit: 2014-11-23 | Discharge: 2014-11-23 | Disposition: A | Discharge status: Home / Self Care | Payer: PRIVATE HEALTH INSURANCE | Source: Ambulatory Visit | Attending: Orthopedic Surgery | Admitting: Orthopedic Surgery

## 2014-11-23 NOTE — H&P (Signed)
  NTS SOAP Note  Vital Signs:  Vitals as of: 11/16/231: Systolic 435: Diastolic 686: Heart Rate 96: Temp 98.71F: Height 27ft 5in: Weight 320Lbs 0 Ounces: Pain Level 10: BMI 53.25  BMI : 53.25 kg/m2  Subjective: This 29 year old female presents for of biliary colic.  Has been having right upper quadrant abdominal pain with radiation to right flank,  nausea,  and fatty food intolerance for over one week.No fever,  chills,  jaundice.  U/S shows cholelithiasisi,  normal common bile duct.  Review of Symptoms:  Constitutional:fatigue headache Eyes:unremarkable   sinus and throat problems Cardiovascular:  unremarkable Respiratory:dyspnea Gastrointestinabdominal pain, nausea Genitourinary:unremarkable   back pain Skin:unremarkable Hematolgic/Lymphatic:unremarkable   Allergic/Immunologic:unremarkable   Past Medical History:  Reviewed  Past Medical History  Surgical History: BTL, knee surgery,  c section Medical Problems: none Allergies: nkda Medications: percocet   Social History:Reviewed  Social History  Preferred Language: English Race:  White Ethnicity: Not Hispanic / Latino Age: 39 year Marital Status:  D Alcohol: socially   Smoking Status: Current every day smoker reviewed on 11/22/2014 Started Date:  Packs per day: 0.50 Functional Status reviewed on 11/22/2014 ------------------------------------------------ Bathing: Normal Cooking: Normal Dressing: Normal Driving: Normal Eating: Normal Managing Meds: Normal Oral Care: Normal Shopping: Normal Toileting: Normal Transferring: Normal Walking: Normal Cognitive Status reviewed on 11/22/2014 ------------------------------------------------ Attention: Normal Decision Making: Normal Language: Normal Memory: Normal Motor: Normal Perception: Normal Problem Solving: Normal Visual and Spatial: Normal   Family History:Reviewed  Family Health History Mother, Healthy;  Father, Healthy;      Objective Information: General:Well appearing, well nourished in no distress. no scleral icterus Heart:RRR, no murmur or gallop.  Normal S1, S2.  No S3, S4.  Lungs:  CTA bilaterally, no wheezes, rhonchi, rales.  Breathing unlabored. Abdomen:Soft, tender in right upper quadrant to palpation,  ND, normal bowel sounds, no HSM, no masses.  No peritoneal signs.  Assessment:Bliary colic,  cholelithiasis  Diagnoses: 574.20  K80.20 Gallstone (Calculus of gallbladder without cholecystitis without obstruction)  Procedures: 16837 - OFFICE OUTPATIENT NEW 30 MINUTES    Plan:  Scheduled for laparoscopic cholecystectomy on 11/25/14.   Patient Education:Alternative treatments to surgery were discussed with patient (and family).  Risks and benefits  of procedure including bleeding,  infection,  hepatobiliary injury,  and the possibility of an open procedure were fully explained to the patient (and family) who gave informed consent. Patient/family questions were addressed.  Follow-up:Pending Surgery

## 2014-11-25 ENCOUNTER — Ambulatory Visit (HOSPITAL_COMMUNITY)
Admission: RE | Admit: 2014-11-25 | Discharge: 2014-11-25 | Disposition: A | Discharge status: Home / Self Care | Payer: Medicaid Other | Source: Ambulatory Visit | Attending: General Surgery | Admitting: General Surgery

## 2014-11-25 ENCOUNTER — Ambulatory Visit (HOSPITAL_COMMUNITY): Payer: Medicaid Other

## 2014-11-25 ENCOUNTER — Ambulatory Visit (HOSPITAL_COMMUNITY): Payer: Medicaid Other | Admitting: Anesthesiology

## 2014-11-25 ENCOUNTER — Encounter (HOSPITAL_COMMUNITY)
Admission: RE | Disposition: A | Discharge status: Home / Self Care | Payer: Self-pay | Source: Ambulatory Visit | Attending: General Surgery

## 2014-11-25 ENCOUNTER — Encounter (HOSPITAL_COMMUNITY): Payer: Self-pay | Admitting: *Deleted

## 2014-11-25 DIAGNOSIS — R059 Cough, unspecified: Secondary | ICD-10-CM

## 2014-11-25 DIAGNOSIS — K8010 Calculus of gallbladder with chronic cholecystitis without obstruction: Secondary | ICD-10-CM | POA: Diagnosis present

## 2014-11-25 DIAGNOSIS — R05 Cough: Secondary | ICD-10-CM

## 2014-11-25 HISTORY — PX: CHOLECYSTECTOMY: SHX55

## 2014-11-25 SURGERY — LAPAROSCOPIC CHOLECYSTECTOMY
Anesthesia: General | Site: Abdomen

## 2014-11-25 MED ORDER — NEOSTIGMINE METHYLSULFATE 10 MG/10ML IV SOLN
INTRAVENOUS | Status: AC
  Filled 2014-11-25: qty 1

## 2014-11-25 MED ORDER — FENTANYL CITRATE 0.05 MG/ML IJ SOLN
INTRAMUSCULAR | Status: AC
  Filled 2014-11-25: qty 5

## 2014-11-25 MED ORDER — MIDAZOLAM HCL 5 MG/5ML IJ SOLN
INTRAMUSCULAR | Status: DC | PRN
  Administered 2014-11-25: 2 mg via INTRAVENOUS

## 2014-11-25 MED ORDER — CIPROFLOXACIN IN D5W 400 MG/200ML IV SOLN
INTRAVENOUS | Status: AC
  Filled 2014-11-25: qty 200

## 2014-11-25 MED ORDER — PROPOFOL 10 MG/ML IV BOLUS
INTRAVENOUS | Status: AC
  Filled 2014-11-25: qty 20

## 2014-11-25 MED ORDER — ONDANSETRON HCL 4 MG/2ML IJ SOLN: 4.0000 mg | Freq: Once | INTRAMUSCULAR | Status: DC | PRN

## 2014-11-25 MED ORDER — CHLORHEXIDINE GLUCONATE 4 % EX LIQD: 1.0000 "application " | Freq: Once | CUTANEOUS | Status: DC

## 2014-11-25 MED ORDER — GLYCOPYRROLATE 0.2 MG/ML IJ SOLN
INTRAMUSCULAR | Status: AC
  Filled 2014-11-25: qty 2

## 2014-11-25 MED ORDER — GLYCOPYRROLATE 0.2 MG/ML IJ SOLN
INTRAMUSCULAR | Status: DC | PRN
  Administered 2014-11-25: 0.4 mg via INTRAVENOUS

## 2014-11-25 MED ORDER — POVIDONE-IODINE 10 % OINT PACKET
TOPICAL_OINTMENT | CUTANEOUS | Status: DC | PRN
  Administered 2014-11-25: 1 via TOPICAL

## 2014-11-25 MED ORDER — SUCCINYLCHOLINE CHLORIDE 20 MG/ML IJ SOLN
INTRAMUSCULAR | Status: AC
  Filled 2014-11-25: qty 1

## 2014-11-25 MED ORDER — BUPIVACAINE HCL (PF) 0.5 % IJ SOLN
INTRAMUSCULAR | Status: AC
  Filled 2014-11-25: qty 30

## 2014-11-25 MED ORDER — NEOSTIGMINE METHYLSULFATE 10 MG/10ML IV SOLN
INTRAVENOUS | Status: DC | PRN
  Administered 2014-11-25 (×2): 2 mg via INTRAVENOUS

## 2014-11-25 MED ORDER — MIDAZOLAM HCL 2 MG/2ML IJ SOLN
INTRAMUSCULAR | Status: AC
  Filled 2014-11-25: qty 2

## 2014-11-25 MED ORDER — CIPROFLOXACIN IN D5W 400 MG/200ML IV SOLN
400.0000 mg | INTRAVENOUS | Status: AC
  Administered 2014-11-25: 400 mg via INTRAVENOUS

## 2014-11-25 MED ORDER — HEMOSTATIC AGENTS (NO CHARGE) OPTIME
TOPICAL | Status: DC | PRN
Start: 2014-11-25 — End: 2014-11-25
  Administered 2014-11-25: 2 via TOPICAL

## 2014-11-25 MED ORDER — POVIDONE-IODINE 10 % EX OINT
TOPICAL_OINTMENT | CUTANEOUS | Status: AC
  Filled 2014-11-25: qty 1

## 2014-11-25 MED ORDER — ROCURONIUM BROMIDE 100 MG/10ML IV SOLN
INTRAVENOUS | Status: DC | PRN
  Administered 2014-11-25: 40 mg via INTRAVENOUS

## 2014-11-25 MED ORDER — OXYCODONE-ACETAMINOPHEN 7.5-325 MG PO TABS: 1.0000 | ORAL_TABLET | ORAL | Status: DC | PRN

## 2014-11-25 MED ORDER — MIDAZOLAM HCL 2 MG/2ML IJ SOLN
1.0000 mg | INTRAMUSCULAR | Status: DC | PRN
  Administered 2014-11-25: 2 mg via INTRAVENOUS
  Filled 2014-11-25: qty 2

## 2014-11-25 MED ORDER — KETOROLAC TROMETHAMINE 30 MG/ML IJ SOLN
30.0000 mg | Freq: Once | INTRAMUSCULAR | Status: AC
  Administered 2014-11-25: 30 mg via INTRAVENOUS
  Filled 2014-11-25: qty 1

## 2014-11-25 MED ORDER — FENTANYL CITRATE 0.05 MG/ML IJ SOLN: 25.0000 ug | INTRAMUSCULAR | Status: DC | PRN

## 2014-11-25 MED ORDER — FENTANYL CITRATE 0.05 MG/ML IJ SOLN
INTRAMUSCULAR | Status: AC
  Filled 2014-11-25: qty 2

## 2014-11-25 MED ORDER — GLYCOPYRROLATE 0.2 MG/ML IJ SOLN
0.2000 mg | Freq: Once | INTRAMUSCULAR | Status: AC
  Administered 2014-11-25: 0.2 mg via INTRAVENOUS
  Filled 2014-11-25: qty 1

## 2014-11-25 MED ORDER — LACTATED RINGERS IV SOLN
INTRAVENOUS | Status: DC
  Administered 2014-11-25: 1000 mL via INTRAVENOUS

## 2014-11-25 MED ORDER — PROPOFOL 10 MG/ML IV BOLUS
INTRAVENOUS | Status: DC | PRN
  Administered 2014-11-25: 150 mg via INTRAVENOUS

## 2014-11-25 MED ORDER — LIDOCAINE HCL 1 % IJ SOLN
INTRAMUSCULAR | Status: DC | PRN
  Administered 2014-11-25: 30 mg via INTRADERMAL

## 2014-11-25 MED ORDER — LIDOCAINE HCL (PF) 1 % IJ SOLN
INTRAMUSCULAR | Status: AC
  Filled 2014-11-25: qty 5

## 2014-11-25 MED ORDER — ROCURONIUM BROMIDE 50 MG/5ML IV SOLN
INTRAVENOUS | Status: AC
  Filled 2014-11-25: qty 1

## 2014-11-25 MED ORDER — SUCCINYLCHOLINE CHLORIDE 20 MG/ML IJ SOLN
INTRAMUSCULAR | Status: DC | PRN
  Administered 2014-11-25: 200 mg via INTRAVENOUS

## 2014-11-25 MED ORDER — BUPIVACAINE HCL (PF) 0.5 % IJ SOLN
INTRAMUSCULAR | Status: DC | PRN
  Administered 2014-11-25: 10 mL

## 2014-11-25 MED ORDER — SODIUM CHLORIDE 0.9 % IR SOLN
Status: DC | PRN
  Administered 2014-11-25: 3000 mL

## 2014-11-25 MED ORDER — SODIUM CHLORIDE 0.9 % IR SOLN
Status: DC | PRN
  Administered 2014-11-25: 1000 mL

## 2014-11-25 MED ORDER — FENTANYL CITRATE 0.05 MG/ML IJ SOLN
INTRAMUSCULAR | Status: DC | PRN
  Administered 2014-11-25: 100 ug via INTRAVENOUS
  Administered 2014-11-25 (×2): 50 ug via INTRAVENOUS

## 2014-11-25 MED ORDER — ONDANSETRON HCL 4 MG/2ML IJ SOLN
4.0000 mg | Freq: Once | INTRAMUSCULAR | Status: AC
  Administered 2014-11-25: 4 mg via INTRAVENOUS
  Filled 2014-11-25: qty 2

## 2014-11-25 SURGICAL SUPPLY — 45 items
APPLIER CLIP LAPSCP 10X32 DD (CLIP) ×3 IMPLANT
BAG HAMPER (MISCELLANEOUS) ×3 IMPLANT
BLADE 11 SAFETY STRL DISP (BLADE) IMPLANT
BLADE SURG SZ11 CARB STEEL (BLADE) ×3 IMPLANT
CHLORAPREP W/TINT 26ML (MISCELLANEOUS) ×3 IMPLANT
CLOTH BEACON ORANGE TIMEOUT ST (SAFETY) ×3 IMPLANT
COVER LIGHT HANDLE STERIS (MISCELLANEOUS) ×6 IMPLANT
DECANTER SPIKE VIAL GLASS SM (MISCELLANEOUS) ×3 IMPLANT
ELECT REM PT RETURN 9FT ADLT (ELECTROSURGICAL) ×3
ELECTRODE REM PT RTRN 9FT ADLT (ELECTROSURGICAL) ×1 IMPLANT
FILTER SMOKE EVAC LAPAROSHD (FILTER) ×3 IMPLANT
FORMALIN 10 PREFIL 120ML (MISCELLANEOUS) ×3 IMPLANT
GLOVE BIOGEL M 7.0 STRL (GLOVE) ×3 IMPLANT
GLOVE BIOGEL PI IND STRL 7.0 (GLOVE) ×2 IMPLANT
GLOVE BIOGEL PI INDICATOR 7.0 (GLOVE) ×4
GLOVE ECLIPSE 6.5 STRL STRAW (GLOVE) ×3 IMPLANT
GLOVE EXAM NITRILE LRG STRL (GLOVE) ×3 IMPLANT
GLOVE SURG SS PI 7.5 STRL IVOR (GLOVE) ×3 IMPLANT
GOWN STRL REUS W/ TWL XL LVL3 (GOWN DISPOSABLE) ×1 IMPLANT
GOWN STRL REUS W/TWL LRG LVL3 (GOWN DISPOSABLE) ×6 IMPLANT
GOWN STRL REUS W/TWL XL LVL3 (GOWN DISPOSABLE) ×2
HEMOSTAT SNOW SURGICEL 2X4 (HEMOSTASIS) ×6 IMPLANT
INST SET LAPROSCOPIC AP (KITS) ×3 IMPLANT
IV NS IRRIG 3000ML ARTHROMATIC (IV SOLUTION) ×3 IMPLANT
KIT ROOM TURNOVER APOR (KITS) ×3 IMPLANT
MANIFOLD NEPTUNE II (INSTRUMENTS) ×3 IMPLANT
NEEDLE INSUFFLATION 14GA 120MM (NEEDLE) ×3 IMPLANT
NS IRRIG 1000ML POUR BTL (IV SOLUTION) ×3 IMPLANT
PACK LAP CHOLE LZT030E (CUSTOM PROCEDURE TRAY) ×3 IMPLANT
PAD ARMBOARD 7.5X6 YLW CONV (MISCELLANEOUS) ×3 IMPLANT
POUCH SPECIMEN RETRIEVAL 10MM (ENDOMECHANICALS) ×3 IMPLANT
SET BASIN LINEN APH (SET/KITS/TRAYS/PACK) ×3 IMPLANT
SET TUBE IRRIG SUCTION NO TIP (IRRIGATION / IRRIGATOR) ×3 IMPLANT
SLEEVE ENDOPATH XCEL 5M (ENDOMECHANICALS) ×3 IMPLANT
SPONGE GAUZE 2X2 8PLY STER LF (GAUZE/BANDAGES/DRESSINGS) ×4
SPONGE GAUZE 2X2 8PLY STRL LF (GAUZE/BANDAGES/DRESSINGS) ×8 IMPLANT
STAPLER VISISTAT (STAPLE) ×3 IMPLANT
SUT VICRYL 0 UR6 27IN ABS (SUTURE) ×3 IMPLANT
TAPE CLOTH SURG 4X10 WHT LF (GAUZE/BANDAGES/DRESSINGS) ×3 IMPLANT
TROCAR ENDO BLADELESS 11MM (ENDOMECHANICALS) ×3 IMPLANT
TROCAR XCEL NON-BLD 5MMX100MML (ENDOMECHANICALS) ×3 IMPLANT
TROCAR XCEL UNIV SLVE 11M 100M (ENDOMECHANICALS) ×3 IMPLANT
TUBING INSUFFLATION (TUBING) ×3 IMPLANT
WARMER LAPAROSCOPE (MISCELLANEOUS) ×3 IMPLANT
YANKAUER SUCT 12FT TUBE ARGYLE (SUCTIONS) ×3 IMPLANT

## 2014-11-25 NOTE — Op Note (Signed)
Patient:  Patricia Stanton  DOB:  [birth year]  MRN:  [id number]   Preop Diagnosis:  Cholecystitis, cholelithiasis   Postop Diagnosis:  Same  Procedure:  Laparoscopic cholecystectomy  Surgeon:  Aviva Signs, M.D.  Anes:  Gen. endotracheal  Indications:  Patient is a 29 year old white female presents with cholecystitis secondary to cholelithiasis. The risks and benefits of the procedure including bleeding, infection, hepatobiliary injury, and the possibility of an open procedure were fully explained to the patient, who gave informed consent.   Procedure note:  The patient was placed the supine position. After induction of general endotracheal anesthesia, the abdomen was prepped and draped using usual sterile technique with DuraPrep. Surgical site confirmation was performed.  A supraumbilical incision was made down to the fascia. A Veress needle was introduced into the abdominal cavity and confirmation of placement was done using the saline drop test. The abdomen was then insufflated to 16 mmHg pressure. An 11 mm trocar was introduced into the abdominal cavity under direct visualization without difficulty. The patient was placed in reverse Trendelenburg position and an additional 11 mm trocar was placed the epigastric region and 5 mm trochars were placed the right upper quadrant and right flank regions. The liver was inspected and noted within normal limits. The gallbladder was noted to be distended. The gallbladder was aspirated at the fundus and hydrops of the gallbladder was found. Multiple stones were present. The gallbladder was retracted in a dynamic fashion in order to expose the triangle of Calot. The cystic duct was first identified. Its juncture to the infundibulum was fully identified. Endoclips were placed proximally and distally on the cystic duct, and the cystic duct was divided. This was likewise done to the cystic artery. The gallbladder was freed away from the gallbladder fossa using  Bovie electrocautery. The gallbladder was delivered to the epigastric trocar site using an Endo Catch bag. The gallbladder fossa was inspected no abnormal bleeding or bile leakage was noted. Surgicel was placed the gallbladder fossa. All fluid and air were then evacuated from the abdominal cavity prior to removal of the trochars.  All wounds were irrigated with normal saline. All wounds were injected with 0.5% Sensorcaine. The supraumbilical fascia as well as epigastric fascia were reapproximated using 0 Vicryl interrupted sutures. All skin incisions were closed using staples. Betadine ointment and dry sterile dressings were applied.  All tape and needle counts were correct at the end of the procedure. The patient was extubated in the operating room and transferred to PACU in stable condition.  Complications:  None  EBL:  Minimal  Specimen:  Gallbladder

## 2014-11-25 NOTE — Anesthesia Preprocedure Evaluation (Addendum)
Anesthesia Evaluation  Patient identified by MRN, date of birth, ID band Patient awake    Reviewed: Allergy & Precautions, NPO status , Patient's Chart, lab work & pertinent test results  Airway Mallampati: III  TM Distance: >3 FB Neck ROM: Full    Dental  (+) Teeth Intact, Dental Advisory Given   Pulmonary pneumonia -, resolved, Current Smoker,  breath sounds clear to auscultation        Cardiovascular negative cardio ROS  Rhythm:Regular Rate:Normal     Neuro/Psych  Headaches, PSYCHIATRIC DISORDERS Anxiety    GI/Hepatic negative GI ROS,   Endo/Other  Morbid obesity  Renal/GU      Musculoskeletal   Abdominal   Peds  Hematology   Anesthesia Other Findings   Reproductive/Obstetrics                            Anesthesia Physical Anesthesia Plan  ASA: II  Anesthesia Plan: General   Post-op Pain Management:    Induction: Intravenous, Rapid sequence and Cricoid pressure planned  Airway Management Planned: Oral ETT  Additional Equipment:   Intra-op Plan:   Post-operative Plan: Extubation in OR  Informed Consent: I have reviewed the patients History and Physical, chart, labs and discussed the procedure including the risks, benefits and alternatives for the proposed anesthesia with the patient or authorized representative who has indicated his/her understanding and acceptance.     Plan Discussed with:   Anesthesia Plan Comments:         Anesthesia Quick Evaluation

## 2014-11-25 NOTE — Discharge Instructions (Signed)

## 2014-11-25 NOTE — Interval H&P Note (Signed)
History and Physical Interval Note:  11/25/2014 8:11 AM  Patricia Stanton  has presented today for surgery, with the diagnosis of cholelithiasis  The various methods of treatment have been discussed with the patient and family. After consideration of risks, benefits and other options for treatment, the patient has consented to  Procedure(s): LAPAROSCOPIC CHOLECYSTECTOMY (N/A) as a surgical intervention .  The patient's history has been reviewed, patient examined, no change in status, stable for surgery.  I have reviewed the patient's chart and labs.  Questions were answered to the patient's satisfaction.     Aviva Signs A

## 2014-11-25 NOTE — Transfer of Care (Signed)
Immediate Anesthesia Transfer of Care Note  Patient: Patricia Stanton  Procedure(s) Performed: Procedure(s): LAPAROSCOPIC CHOLECYSTECTOMY (N/A)  Patient Location: PACU  Anesthesia Type:General  Level of Consciousness: sedated  Airway & Oxygen Therapy: Patient Spontanous Breathing and Patient connected to face mask oxygen  Post-op Assessment: Report given to RN, Post -op Vital signs reviewed and stable and Patient moving all extremities  Post vital signs: Reviewed and stable  Last Vitals:  Filed Vitals:   11/25/14 0830  BP: 148/88  Temp:   Resp: 22    Complications: No apparent anesthesia complications

## 2014-11-25 NOTE — Anesthesia Procedure Notes (Signed)
Procedure Name: Intubation Date/Time: 11/25/2014 8:47 AM Performed by: Charmaine Downs Pre-anesthesia Checklist: Patient being monitored, Suction available, Emergency Drugs available and Patient identified Patient Re-evaluated:Patient Re-evaluated prior to inductionOxygen Delivery Method: Circle system utilized Preoxygenation: Pre-oxygenation with 100% oxygen Intubation Type: IV induction, Rapid sequence and Cricoid Pressure applied Laryngoscope Size: Mac and 3 Grade View: Grade III Tube type: Oral Tube size: 7.0 mm Number of attempts: 1 Airway Equipment and Method: Stylet Placement Confirmation: ETT inserted through vocal cords under direct vision,  breath sounds checked- equal and bilateral and positive ETCO2 Secured at: 22 cm Tube secured with: Tape Dental Injury: Teeth and Oropharynx as per pre-operative assessment  Difficulty Due To: Difficulty was anticipated, Difficult Airway- due to limited oral opening and Difficult Airway- due to large tongue

## 2014-11-25 NOTE — Anesthesia Postprocedure Evaluation (Signed)
  Anesthesia Post-op Note  Patient: Patricia Stanton  Procedure(s) Performed: Procedure(s): LAPAROSCOPIC CHOLECYSTECTOMY (N/A)  Patient Location: PACU  Anesthesia Type:General  Level of Consciousness: awake, alert , oriented and patient cooperative  Airway and Oxygen Therapy: Patient Spontanous Breathing  Post-op Pain: 4 /10, moderate  Post-op Assessment: Post-op Vital signs reviewed, Patient's Cardiovascular Status Stable, Respiratory Function Stable, Patent Airway and Pain level controlled  Post-op Vital Signs: Reviewed and stable  Last Vitals:  Filed Vitals:   11/25/14 0830  BP: 148/88  Temp:   Resp: 22    Complications: No apparent anesthesia complications

## 2014-11-28 ENCOUNTER — Encounter (HOSPITAL_COMMUNITY): Payer: Self-pay | Admitting: General Surgery

## 2014-11-30 MED FILL — Ondansetron HCl Tab 4 MG: ORAL | Qty: 4 | Status: AC

## 2014-11-30 MED FILL — Oxycodone w/ Acetaminophen Tab 5-325 MG: ORAL | Qty: 6 | Status: AC

## 2015-06-22 ENCOUNTER — Encounter (HOSPITAL_COMMUNITY): Payer: Self-pay | Admitting: *Deleted

## 2015-06-22 ENCOUNTER — Emergency Department (HOSPITAL_COMMUNITY)
Admission: EM | Admit: 2015-06-22 | Discharge: 2015-06-22 | Disposition: A | Discharge status: Home / Self Care | Payer: Medicaid Other | Attending: Emergency Medicine | Admitting: Emergency Medicine

## 2015-06-22 DIAGNOSIS — G8929 Other chronic pain: Secondary | ICD-10-CM | POA: Insufficient documentation

## 2015-06-22 DIAGNOSIS — Z720 Tobacco use: Secondary | ICD-10-CM | POA: Diagnosis not present

## 2015-06-22 DIAGNOSIS — Y92002 Bathroom of unspecified non-institutional (private) residence single-family (private) house as the place of occurrence of the external cause: Secondary | ICD-10-CM | POA: Diagnosis not present

## 2015-06-22 DIAGNOSIS — Z8742 Personal history of other diseases of the female genital tract: Secondary | ICD-10-CM | POA: Diagnosis not present

## 2015-06-22 DIAGNOSIS — W01198A Fall on same level from slipping, tripping and stumbling with subsequent striking against other object, initial encounter: Secondary | ICD-10-CM | POA: Insufficient documentation

## 2015-06-22 DIAGNOSIS — Y9389 Activity, other specified: Secondary | ICD-10-CM | POA: Diagnosis not present

## 2015-06-22 DIAGNOSIS — F419 Anxiety disorder, unspecified: Secondary | ICD-10-CM | POA: Insufficient documentation

## 2015-06-22 DIAGNOSIS — S39012A Strain of muscle, fascia and tendon of lower back, initial encounter: Secondary | ICD-10-CM | POA: Insufficient documentation

## 2015-06-22 DIAGNOSIS — E669 Obesity, unspecified: Secondary | ICD-10-CM | POA: Insufficient documentation

## 2015-06-22 DIAGNOSIS — Y998 Other external cause status: Secondary | ICD-10-CM | POA: Diagnosis not present

## 2015-06-22 DIAGNOSIS — Z793 Long term (current) use of hormonal contraceptives: Secondary | ICD-10-CM | POA: Diagnosis not present

## 2015-06-22 DIAGNOSIS — S3992XA Unspecified injury of lower back, initial encounter: Secondary | ICD-10-CM | POA: Diagnosis present

## 2015-06-22 MED ORDER — HYDROCODONE-ACETAMINOPHEN 5-325 MG PO TABS
ORAL_TABLET | ORAL | Status: DC
Start: 2015-06-22 — End: 2015-06-28

## 2015-06-22 MED ORDER — METHOCARBAMOL 500 MG PO TABS: 500.0000 mg | ORAL_TABLET | Freq: Three times a day (TID) | ORAL | Status: DC

## 2015-06-22 NOTE — Discharge Instructions (Signed)

## 2015-06-22 NOTE — ED Provider Notes (Signed)
CSN: [id number]     Arrival date & time 06/22/15  6144 History   First MD Initiated Contact with Patient 06/22/15 [phone]     Chief Complaint  Patient presents with  . Back Pain     (Consider location/radiation/quality/duration/timing/severity/associated sxs/prior Treatment) HPI   Patricia Stanton is a 29 y.o. female who presents to the Emergency Department complaining of a mechanical fall last evening.  She c/o pain to her left low back and buttock pain .  She states she slipped in the bathroom, fell onto her left buttock.  She describes a sharp pain to left back that radiates into her lateral thigh.  Pain is worse with movement, and improves at rest.  She took ibuprofen last evening and applied ice with minimal relief.  She denies head injury, LOC, neck pain or spinal tenderness.  She also denies numbness or weakness of the lower extremities, urine or bowel changes, abdominal pain.   Past Medical History  Diagnosis Date  . Anxiety   . Obesity   . Tobacco abuse   . Ovarian cyst   . Endometriosis   . Chronic abdominal pain   . Chronic headaches    Past Surgical History  Procedure Laterality Date  . Femur fracture surgery Left   . Knee arthroscopy Left   . Tubal ligation    . Cesarean section  2008    [place]  . Cholecystectomy N/A 11/25/2014    Procedure: LAPAROSCOPIC CHOLECYSTECTOMY;  Surgeon: Aviva Signs Md, MD;  Location: [facility];  Service: General;  Laterality: N/A;   Family History  Problem Relation Age of Onset  . Hypertension Mother   . Hyperlipidemia Mother   . Fibromyalgia Mother   . Other Mother     degenerative disc disease  . Diabetes Father   . Hypertension Father   . Hyperlipidemia Father   . Heart disease Father   . Heart attack Father   . Stroke Father   . Hyperlipidemia Brother   . Hypertension Brother    Social History  Substance Use Topics  . Smoking status: Current Every Day Smoker -- 0.50 packs/day for 11 years    Types: Cigarettes  . Smokeless  tobacco: Never Used  . Alcohol Use: No   OB History    No data available     Review of Systems  Constitutional: Negative for fever.  Respiratory: Negative for shortness of breath.   Gastrointestinal: Negative for vomiting, abdominal pain and constipation.  Genitourinary: Negative for dysuria, hematuria, flank pain, decreased urine volume and difficulty urinating.  Musculoskeletal: Positive for back pain. Negative for joint swelling.  Skin: Negative for rash.  Neurological: Negative for weakness and numbness.  All other systems reviewed and are negative.     Allergies  Review of patient's allergies indicates no known allergies.  Home Medications   Prior to Admission medications   Medication Sig Start Date End Date Taking? Authorizing Provider  leuprolide (LUPRON) 3.75 MG injection Inject 11.25 mg into the muscle once. 06/27/14   Jonnie Kind, MD  norethindrone (AYGESTIN) 5 MG tablet Take 1 tablet (5 mg total) by mouth daily. 07/14/14   Jonnie Kind, MD  ondansetron (ZOFRAN ODT) 4 MG disintegrating tablet Take 1 tablet (4 mg total) by mouth every 8 (eight) hours as needed for nausea or vomiting. 11/16/14   Hope Bunnie Pion, NP  oxyCODONE-acetaminophen (PERCOCET) 7.5-325 MG per tablet Take 1-2 tablets by mouth every 4 (four) hours as needed. 11/25/14   Aviva Signs, MD  BP 165/97 mmHg  Pulse 102  Temp(Src) 97.6 F (36.4 C) (Oral)  Resp 18  Ht 5\' 4"  (1.626 m)  Wt 320 lb (145.151 kg)  BMI 54.90 kg/m2  SpO2 100% Physical Exam  Constitutional: She is oriented to person, place, and time. She appears well-developed and well-nourished. No distress.  Pt is morbidly obese  HENT:  Head: Normocephalic and atraumatic.  Neck: Normal range of motion. Neck supple.  Cardiovascular: Normal rate, regular rhythm, normal heart sounds and intact distal pulses.   No murmur heard. Pulmonary/Chest: Effort normal and breath sounds normal. No respiratory distress.  Abdominal: Soft. She exhibits  no distension. There is no tenderness.  Musculoskeletal: She exhibits tenderness. She exhibits no edema.       Lumbar back: She exhibits tenderness and pain. She exhibits normal range of motion, no swelling, no deformity, no laceration and normal pulse.  ttp of the left lumbar paraspinal muscles.  No spinal tenderness.  No edema, abrasion or ecchymosis.  DP pulses are brisk and symmetrical.  Distal sensation intact.  Hip Flexors/Extensors are intact.  Pt has 5/5 strength against resistance of bilateral lower extremities.     Neurological: She is alert and oriented to person, place, and time. She has normal strength. No sensory deficit. She exhibits normal muscle tone. Coordination and gait normal.  Reflex Scores:      Patellar reflexes are 2+ on the right side and 2+ on the left side.      Achilles reflexes are 2+ on the right side and 2+ on the left side. Skin: Skin is warm and dry. No rash noted.  Nursing note and vitals reviewed.   ED Course  Procedures (including critical care time) Labs Review Labs Reviewed - No data to display  Imaging Review No results found. I have personally reviewed and evaluated these images and lab results as part of my medical decision-making.   EKG Interpretation None      MDM   Final diagnoses:  Lumbar strain, initial encounter    Pt is well appearing.  Vitals stable.  Ambulates with a steady gait.  No focal neuro deficits, no concerning sx's for emergent neurological process.  Sx's c/w musculoskeletal injury.  Pt agrees to symptomatic tx and close PMD f/u in one week if not improved  She appears stable for d/c    Kem Parkinson, PA-C 06/24/15 3220  Forde Dandy, MD 06/26/15 [phone]

## 2015-06-22 NOTE — ED Notes (Signed)
Patient reports slipped and fell in bathroom yesterday, fell back "on her butt". Reports left low back pain that wraps around to left hip and thigh. Denies hitting head. Ambulatory from triage.

## 2015-06-25 ENCOUNTER — Emergency Department (HOSPITAL_COMMUNITY)
Admission: EM | Admit: 2015-06-25 | Discharge: 2015-06-25 | Disposition: A | Discharge status: Home / Self Care | Payer: Worker's Compensation | Attending: Emergency Medicine | Admitting: Emergency Medicine

## 2015-06-25 ENCOUNTER — Encounter (HOSPITAL_COMMUNITY): Payer: Self-pay | Admitting: Emergency Medicine

## 2015-06-25 ENCOUNTER — Emergency Department (HOSPITAL_COMMUNITY): Payer: Worker's Compensation

## 2015-06-25 DIAGNOSIS — Y93F2 Activity, caregiving, lifting: Secondary | ICD-10-CM | POA: Insufficient documentation

## 2015-06-25 DIAGNOSIS — Z720 Tobacco use: Secondary | ICD-10-CM | POA: Diagnosis not present

## 2015-06-25 DIAGNOSIS — X58XXXA Exposure to other specified factors, initial encounter: Secondary | ICD-10-CM | POA: Diagnosis not present

## 2015-06-25 DIAGNOSIS — S3992XA Unspecified injury of lower back, initial encounter: Secondary | ICD-10-CM | POA: Diagnosis present

## 2015-06-25 DIAGNOSIS — E669 Obesity, unspecified: Secondary | ICD-10-CM | POA: Insufficient documentation

## 2015-06-25 DIAGNOSIS — Z8781 Personal history of (healed) traumatic fracture: Secondary | ICD-10-CM | POA: Diagnosis not present

## 2015-06-25 DIAGNOSIS — Z8659 Personal history of other mental and behavioral disorders: Secondary | ICD-10-CM | POA: Diagnosis not present

## 2015-06-25 DIAGNOSIS — Z791 Long term (current) use of non-steroidal anti-inflammatories (NSAID): Secondary | ICD-10-CM | POA: Diagnosis not present

## 2015-06-25 DIAGNOSIS — Z8742 Personal history of other diseases of the female genital tract: Secondary | ICD-10-CM | POA: Diagnosis not present

## 2015-06-25 DIAGNOSIS — S39012A Strain of muscle, fascia and tendon of lower back, initial encounter: Secondary | ICD-10-CM | POA: Diagnosis not present

## 2015-06-25 DIAGNOSIS — G8929 Other chronic pain: Secondary | ICD-10-CM | POA: Insufficient documentation

## 2015-06-25 DIAGNOSIS — Y990 Civilian activity done for income or pay: Secondary | ICD-10-CM | POA: Insufficient documentation

## 2015-06-25 DIAGNOSIS — Y92129 Unspecified place in nursing home as the place of occurrence of the external cause: Secondary | ICD-10-CM | POA: Diagnosis not present

## 2015-06-25 DIAGNOSIS — Z3202 Encounter for pregnancy test, result negative: Secondary | ICD-10-CM | POA: Diagnosis not present

## 2015-06-25 LAB — URINALYSIS, ROUTINE W REFLEX MICROSCOPIC
Bilirubin Urine: NEGATIVE
Glucose, UA: NEGATIVE mg/dL
Hgb urine dipstick: NEGATIVE
LEUKOCYTES UA: NEGATIVE
NITRITE: NEGATIVE
Protein, ur: NEGATIVE mg/dL
Urobilinogen, UA: 0.2 mg/dL (ref 0.0–1.0)
pH: 6 (ref 5.0–8.0)

## 2015-06-25 LAB — PREGNANCY, URINE: Preg Test, Ur: NEGATIVE

## 2015-06-25 MED ORDER — HYDROCODONE-ACETAMINOPHEN 5-325 MG PO TABS
2.0000 | ORAL_TABLET | Freq: Once | ORAL | Status: AC
  Administered 2015-06-25: 2 via ORAL
  Filled 2015-06-25: qty 2

## 2015-06-25 MED ORDER — METHOCARBAMOL 500 MG PO TABS: 500.0000 mg | ORAL_TABLET | Freq: Four times a day (QID) | ORAL | Status: DC

## 2015-06-25 NOTE — ED Notes (Signed)
Pt verbalized understanding of no driving and to use caution within 4 hours of taking pain meds due to meds cause drowsiness 

## 2015-06-25 NOTE — ED Notes (Signed)
Pt states that she was moving a patient at work today and hurt her back.

## 2015-06-25 NOTE — ED Provider Notes (Signed)
CSN: [id number]     Arrival date & time 06/25/15  1812 History  By signing my name below, I, Patricia Stanton, attest that this documentation has been prepared under the direction and in the presence of Patricia Jefferson, PA-C. Electronically Signed: Meriel Stanton, ED Scribe. 06/25/2015. 7:52 PM.    Chief Complaint  Patient presents with  . Back Pain   The history is provided by the patient. No language interpreter was used.   HPI Comments: Patricia Stanton is a 29 y.o. 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 She reports feeling a pop in her right back while squatting to lift the pt off the floor and again when standing up from squatting down. Pt was ambulatory after the incident but reports worsening of the pain with ambulation. The pt was seen in the ED 3 days ago s/p fall c/o left-sided lumbar back pain. She was diagnosed with a lumbar strain and prescribed robaxin, norco, and ibuprofen; pt reports she has not taken any of the norco as she 'does not believe in pain medication'. She is not followed by a PCP and has not had Xrays of her back in the past. She denies bladder or bowel incontinence, difficulty urinating, or radiation of the pain to BLE. NKDA.   Past Medical History  Diagnosis Date  . Anxiety   . Obesity   . Tobacco abuse   . Ovarian cyst   . Endometriosis   . Chronic abdominal pain   . Chronic headaches    Past Surgical History  Procedure Laterality Date  . Femur fracture surgery Left   . Knee arthroscopy Left   . Tubal ligation    . Cesarean section  2008    [place]  . Cholecystectomy N/A 11/25/2014    Procedure: LAPAROSCOPIC CHOLECYSTECTOMY;  Surgeon: Aviva Signs Md, MD;  Location: [facility];  Service: General;  Laterality: N/A;   Family  History  Problem Relation Age of Onset  . Hypertension Mother   . Hyperlipidemia Mother   . Fibromyalgia Mother   . Other Mother     degenerative disc disease  . Diabetes Father   . Hypertension Father   . Hyperlipidemia Father   . Heart disease Father   . Heart attack Father   . Stroke Father   . Hyperlipidemia Brother   . Hypertension Brother    Social History  Substance Use Topics  . Smoking status: Current Every Day Smoker -- 0.50 packs/day for 11 years    Types: Cigarettes  . Smokeless tobacco: Never Used  . Alcohol Use: No   OB History    No data available     Review of Systems  Constitutional: Negative for fever.  Respiratory: Negative for shortness of breath.   Cardiovascular: Negative for chest pain and leg swelling.  Gastrointestinal: Negative for abdominal pain, constipation and abdominal distention.  Genitourinary: Negative for dysuria, urgency, frequency, flank pain and difficulty urinating.  Musculoskeletal: Positive for back pain. Negative for joint swelling, arthralgias and gait problem.  Skin: Negative for rash.  Neurological: Negative for weakness and numbness.   Allergies  Review of patient's allergies indicates no known allergies.  Home Medications   Prior to Admission medications   Medication Sig Start Date End Date Taking? Authorizing Provider  HYDROcodone-acetaminophen (  NORCO/VICODIN) 5-325 MG tablet Take one-two tabs po q 4-6 hrs prn pain Patient taking differently: Take 1-2 tablets by mouth every 4 (four) hours as needed for moderate pain or severe pain.  06/22/15  Yes Tammy Triplett, PA-C  ibuprofen (ADVIL,MOTRIN) 200 MG tablet Take 400 mg by mouth 3 (three) times daily.    Yes Historical Provider, MD  methocarbamol (ROBAXIN) 500 MG tablet Take 1 tablet (500 mg total) by mouth 4 (four) times daily. 06/25/15 07/05/15  Patricia Jefferson, PA-C  ondansetron (ZOFRAN ODT) 4 MG disintegrating tablet Take 1 tablet (4 mg total) by mouth every 8 (eight) hours as  needed for nausea or vomiting. Patient not taking: Reported on 06/22/2015 11/16/14   [name], NP  oxyCODONE-acetaminophen (PERCOCET) 7.5-325 MG per tablet Take 1-2 tablets by mouth every 4 (four) hours as needed. Patient not taking: Reported on 06/22/2015 11/25/14   Aviva Signs, MD   BP 153/87 mmHg  Pulse 111  Temp(Src) 97.6 F (36.4 C) (Oral)  Resp 18  Ht 5\' 3"  (1.6 m)  Wt 320 lb (145.151 kg)  BMI 56.70 kg/m2  SpO2 100% Physical Exam  Constitutional: She appears well-developed and well-nourished.  HENT:  Head: Normocephalic.  Eyes: Conjunctivae are normal.  Neck: Normal range of motion. Neck supple.  Cardiovascular: Normal rate and intact distal pulses.   Pedal pulses normal.  Pulmonary/Chest: Effort normal.  Abdominal: Soft. Bowel sounds are normal. She exhibits no distension and no mass.  Musculoskeletal: Normal range of motion. She exhibits no edema.       Lumbar back: She exhibits tenderness. She exhibits no swelling, no edema and no spasm.  Neurological: She is alert. She has normal strength. She displays no atrophy and no tremor. No sensory deficit. Gait normal.  Reflex Scores:      Patellar reflexes are 2+ on the right side and 2+ on the left side.      Achilles reflexes are 2+ on the right side and 2+ on the left side. No strength deficit noted in hip and knee flexor and extensor muscle groups.  Ankle flexion and extension intact.  Skin: Skin is warm and dry.  Psychiatric: She has a normal mood and affect.  Nursing note and vitals reviewed.   ED Course  Procedures  DIAGNOSTIC STUDIES: Oxygen Saturation is 100% on RA, normal by my interpretation.    COORDINATION OF CARE: 7:51 PM Discussed treatment plan with pt at bedside and pt agreed to plan. Will order Xray of lumbar spine.   Imaging Review Dg Lumbar Spine Complete  06/25/2015   CLINICAL DATA:  Low back pain.  EXAM: LUMBAR SPINE - COMPLETE 4+ VIEW  COMPARISON:  CT scan of the abdomen and pelvis dated  02/23/2012  FINDINGS: There is no evidence of lumbar spine fracture. Alignment is normal. Intervertebral disc spaces are maintained.  IMPRESSION: Negative.   Electronically Signed   By: Lorriane Shire M.D.   On: 06/25/2015 21:00   I have personally reviewed and evaluated these images and lab results as part of my medical decision-making.   MDM   Final diagnoses:  Lumbar strain, initial encounter     Radiological studies were viewed, interpreted and considered during the medical decision making and disposition process. I agree with radiologists reading.  Results were also discussed with patient.   Discussed possible reasons for sx including muscle spasm, inflammation, disk injury (which cannot be fully assess with xray). Pt understands.  Will refill her robaxin as she is almost out of  this medicine.  Advised f/u with pcp or as directed by her employer.  Prn f/u anticipated.   I personally performed the services described in this documentation, which was scribed in my presence. The recorded information has been reviewed and is accurate.   Patricia Jefferson, PA-C 06/27/15 1313  Tanna Furry, MD 06/28/15 2325

## 2015-06-25 NOTE — Discharge Instructions (Signed)
Lumbosacral Strain °Lumbosacral strain is a strain of any of the parts that make up your lumbosacral vertebrae. Your lumbosacral vertebrae are the bones that make up the lower third of your backbone. Your lumbosacral vertebrae are held together by muscles and tough, fibrous tissue (ligaments).  °CAUSES  °A sudden blow to your back can cause lumbosacral strain. Also, anything that causes an excessive stretch of the muscles in the low back can cause this strain. This is typically seen when people exert themselves strenuously, fall, lift heavy objects, bend, or crouch repeatedly. °RISK FACTORS °· Physically demanding work. °· Participation in pushing or pulling sports or sports that require a sudden twist of the back (tennis, golf, baseball). °· Weight lifting. °· Excessive lower back curvature. °· Forward-tilted pelvis. °· Weak back or abdominal muscles or both. °· Tight hamstrings. °SIGNS AND SYMPTOMS  °Lumbosacral strain may cause pain in the area of your injury or pain that moves (radiates) down your leg.  °DIAGNOSIS °Your health care provider can often diagnose lumbosacral strain through a physical exam. In some cases, you may need tests such as X-ray exams.  °TREATMENT  °Treatment for your lower back injury depends on many factors that your clinician will have to evaluate. However, most treatment will include the use of anti-inflammatory medicines. °HOME CARE INSTRUCTIONS  °· Avoid hard physical activities (tennis, racquetball, waterskiing) if you are not in proper physical condition for it. This may aggravate or create problems. °· If you have a back problem, avoid sports requiring sudden body movements. Swimming and walking are generally safer activities. °· Maintain good posture. °· Maintain a healthy weight. °· For acute conditions, you may put ice on the injured area. °¨ Put ice in a plastic bag. °¨ Place a towel between your skin and the bag. °¨ Leave the ice on for 20 minutes, 2-3 times a day. °· When the  low back starts healing, stretching and strengthening exercises may be recommended. °SEEK MEDICAL CARE IF: °· Your back pain is getting worse. °· You experience severe back pain not relieved with medicines. °SEEK IMMEDIATE MEDICAL CARE IF:  °· You have numbness, tingling, weakness, or problems with the use of your arms or legs. °· There is a change in bowel or bladder control. °· You have increasing pain in any area of the body, including your belly (abdomen). °· You notice shortness of breath, dizziness, or feel faint. °· You feel sick to your stomach (nauseous), are throwing up (vomiting), or become sweaty. °· You notice discoloration of your toes or legs, or your feet get very cold. °MAKE SURE YOU:  °· Understand these instructions. °· Will watch your condition. °· Will get help right away if you are not doing well or get worse. °  °This information is not intended to replace advice given to you by your health care provider. Make sure you discuss any questions you have with your health care provider. °  °Document Released: 06/12/2005 Document Revised: 09/23/2014 Document Reviewed: 04/21/2013 °Elsevier Interactive Patient Education ©2016 [employer] ° °Muscle Strain °A muscle strain is an injury that occurs when a muscle is stretched beyond its normal length. Usually a small number of muscle fibers are torn when this happens. Muscle strain is rated in degrees. First-degree strains have the least amount of muscle fiber tearing and pain. Second-degree and third-degree strains have increasingly more tearing and pain.  °Usually, recovery from muscle strain takes 1-2 weeks. Complete healing takes 5-6 weeks.  °CAUSES  °Muscle strain happens when   a sudden, violent force placed on a muscle stretches it too far. This may occur with lifting, sports, or a fall.  °RISK FACTORS °Muscle strain is especially common in athletes.  °SIGNS AND SYMPTOMS °At the site of the muscle strain, there may  be: °· Pain. °· Bruising. °· Swelling. °· Difficulty using the muscle due to pain or lack of normal function. °DIAGNOSIS  °Your health care provider will perform a physical exam and ask about your medical history. °TREATMENT  °Often, the best treatment for a muscle strain is resting, icing, and applying cold compresses to the injured area.   °HOME CARE INSTRUCTIONS  °· Use the PRICE method of treatment to promote muscle healing during the first 2-3 days after your injury. The PRICE method involves: °¨ Protecting the muscle from being injured again. °¨ Restricting your activity and resting the injured body part. °¨ Icing your injury. To do this, put ice in a plastic bag. Place a towel between your skin and the bag. Then, apply the ice and leave it on from 15-20 minutes each hour. After the third day, switch to moist heat packs. °¨ Apply compression to the injured area with a splint or elastic bandage. Be careful not to wrap it too tightly. This may interfere with blood circulation or increase swelling. °¨ Elevate the injured body part above the level of your heart as often as you can. °· Only take over-the-counter or prescription medicines for pain, discomfort, or fever as directed by your health care provider. °· Warming up prior to exercise helps to prevent future muscle strains. °SEEK MEDICAL CARE IF:  °· You have increasing pain or swelling in the injured area. °· You have numbness, tingling, or a significant loss of strength in the injured area. °MAKE SURE YOU:  °· Understand these instructions. °· Will watch your condition. °· Will get help right away if you are not doing well or get worse. °  °This information is not intended to replace advice given to you by your health care provider. Make sure you discuss any questions you have with your health care provider. °  °Document Released: 09/02/2005 Document Revised: 06/23/2013 Document Reviewed: 04/01/2013 °Elsevier Interactive Patient Education ©2016 [employer]  Inc[employer] ° °

## 2015-06-28 ENCOUNTER — Emergency Department (HOSPITAL_COMMUNITY): Payer: Medicaid Other

## 2015-06-28 ENCOUNTER — Emergency Department (HOSPITAL_COMMUNITY)
Admission: EM | Admit: 2015-06-28 | Discharge: 2015-06-28 | Disposition: A | Discharge status: Home / Self Care | Payer: Medicaid Other | Attending: Emergency Medicine | Admitting: Emergency Medicine

## 2015-06-28 ENCOUNTER — Encounter (HOSPITAL_COMMUNITY): Payer: Self-pay | Admitting: Emergency Medicine

## 2015-06-28 DIAGNOSIS — Z8659 Personal history of other mental and behavioral disorders: Secondary | ICD-10-CM | POA: Diagnosis not present

## 2015-06-28 DIAGNOSIS — Z8742 Personal history of other diseases of the female genital tract: Secondary | ICD-10-CM | POA: Insufficient documentation

## 2015-06-28 DIAGNOSIS — Z720 Tobacco use: Secondary | ICD-10-CM | POA: Insufficient documentation

## 2015-06-28 DIAGNOSIS — R000 Tachycardia, unspecified: Secondary | ICD-10-CM | POA: Insufficient documentation

## 2015-06-28 DIAGNOSIS — M549 Dorsalgia, unspecified: Secondary | ICD-10-CM

## 2015-06-28 DIAGNOSIS — R350 Frequency of micturition: Secondary | ICD-10-CM | POA: Insufficient documentation

## 2015-06-28 DIAGNOSIS — Z791 Long term (current) use of non-steroidal anti-inflammatories (NSAID): Secondary | ICD-10-CM | POA: Insufficient documentation

## 2015-06-28 DIAGNOSIS — G8929 Other chronic pain: Secondary | ICD-10-CM | POA: Insufficient documentation

## 2015-06-28 DIAGNOSIS — M545 Low back pain: Secondary | ICD-10-CM | POA: Diagnosis present

## 2015-06-28 LAB — URINALYSIS, ROUTINE W REFLEX MICROSCOPIC
Bilirubin Urine: NEGATIVE
GLUCOSE, UA: NEGATIVE mg/dL
Hgb urine dipstick: NEGATIVE
Ketones, ur: NEGATIVE mg/dL
LEUKOCYTES UA: NEGATIVE
Nitrite: NEGATIVE
PROTEIN: NEGATIVE mg/dL
Specific Gravity, Urine: 1.025 (ref 1.005–1.030)
Urobilinogen, UA: 0.2 mg/dL (ref 0.0–1.0)
pH: 6 (ref 5.0–8.0)

## 2015-06-28 MED ORDER — NAPROXEN 500 MG PO TABS: 500.0000 mg | ORAL_TABLET | Freq: Two times a day (BID) | ORAL | Status: DC

## 2015-06-28 MED ORDER — HYDROCODONE-ACETAMINOPHEN 5-325 MG PO TABS: 2.0000 | ORAL_TABLET | ORAL | Status: DC | PRN

## 2015-06-28 MED ORDER — HYDROMORPHONE HCL 2 MG/ML IJ SOLN
2.0000 mg | INTRAMUSCULAR | Status: AC
Start: 2015-06-28 — End: 2015-06-28
  Administered 2015-06-28: 2 mg via INTRAMUSCULAR
  Filled 2015-06-28: qty 1

## 2015-06-28 MED ORDER — METHOCARBAMOL 500 MG PO TABS: 500.0000 mg | ORAL_TABLET | Freq: Two times a day (BID) | ORAL | Status: DC | PRN

## 2015-06-28 NOTE — ED Notes (Addendum)
Pt was seen here Sunday for injury at her job for lower right back pain.  Pt had xrays taken, was told if got worse to come back to have more testing done (MRI). Pt has had numbness/tingling down both legs.  Pt alert and oriented.

## 2015-06-28 NOTE — Discharge Instructions (Signed)
Please obtain all of your results from medical records or have your doctors office obtain the results - share them with your doctor - you should be seen at your doctors office in the next 2 days. Call today to arrange your follow up. Take the medications as prescribed. Please review all of the medicines and only take them if you do not have an allergy to them. Please be aware that if you are taking birth control pills, taking other prescriptions, ESPECIALLY ANTIBIOTICS may make the birth control ineffective - if this is the case, either do not engage in sexual activity or use alternative methods of birth control such as condoms until you have finished the medicine and your family doctor says it is OK to restart them. If you are on a blood thinner such as COUMADIN, be aware that any other medicine that you take may cause the coumadin to either work too much, or not enough - you should have your coumadin level rechecked in next 7 days if this is the case.  ?  It is also a possibility that you have an allergic reaction to any of the medicines that you have been prescribed - Everybody reacts differently to medications and while MOST people have no trouble with most medicines, you may have a reaction such as nausea, vomiting, rash, swelling, shortness of breath. If this is the case, please stop taking the medicine immediately and contact your physician.  ?  You should return to the ER if you develop severe or worsening symptoms.    [facility] Primary Care Doctor List    Sinda Du MD. Specialty: Pulmonary Disease Contact information: [address]  [place] [address]  [phone]   Tula Nakayama, MD. Specialty: [facility] Medicine Contact information: [address] [place] [address]  [phone]   Sallee Lange, MD. Specialty: [facility] Medicine Contact information: [place]  [place] [address]  [phone]   Rosita Fire, MD Specialty:  Internal Medicine Contact information: [place] [address]  [phone]   Delphina Cahill, MD. Specialty: Internal Medicine Contact information: [place] [address]  [phone]   Marjean Donna, MD. Specialty: Family Medicine Contact information: [place] [address]  [phone]   Leslie Andrea, MD. Specialty: Family Medicine Contact information: [place] [address]  [phone]   Asencion Noble, MD. Specialty: Internal Medicine Contact information: [address]   [place] [address]  [phone]     Back Pain, Adult Back pain is very common in adults.The cause of back pain is rarely dangerous and the pain often gets better over time.The cause of your back pain may not be known. Some common causes of back pain include:  Strain of the muscles or ligaments supporting the spine.  Wear and tear (degeneration) of the spinal disks.  Arthritis.  Direct injury to the back. For many people, back pain may return. Since back pain is rarely dangerous, most people can learn to manage this condition on their own. HOME CARE INSTRUCTIONS Watch your back pain for any changes. The following actions may help to lessen any discomfort you are feeling:  Remain active. It is stressful on your back to sit or stand in one place for long periods of time. Do not sit, drive, or stand in one place for more than 30  minutes at a time. Take short walks on even surfaces as soon as you are able.Try to increase the length of time you walk each day.  Exercise regularly as directed by your health care provider. Exercise helps your back heal faster. It also helps avoid future injury by keeping your muscles strong and flexible.  Do not stay in bed.Resting more than 1-2 days can delay your recovery.  Pay attention to your body when you bend and lift. The most comfortable positions are those  that put less stress on your recovering back. Always use proper lifting techniques, including:  Bending your knees.  Keeping the load close to your body.  Avoiding twisting.  Find a comfortable position to sleep. Use a firm mattress and lie on your side with your knees slightly bent. If you lie on your back, put a pillow under your knees.  Avoid feeling anxious or stressed.Stress increases muscle tension and can worsen back pain.It is important to recognize when you are anxious or stressed and learn ways to manage it, such as with exercise.  Take medicines only as directed by your health care provider. Over-the-counter medicines to reduce pain and inflammation are often the most helpful.Your health care provider may prescribe muscle relaxant drugs.These medicines help dull your pain so you can more quickly return to your normal activities and healthy exercise.  Apply ice to the injured area:  Put ice in a plastic bag.  Place a towel between your skin and the bag.  Leave the ice on for 20 minutes, 2-3 times a day for the first 2-3 days. After that, ice and heat may be alternated to reduce pain and spasms.  Maintain a healthy weight. Excess weight puts extra stress on your back and makes it difficult to maintain good posture. SEEK MEDICAL CARE IF:  You have pain that is not relieved with rest or medicine.  You have increasing pain going down into the legs or buttocks.  You have pain that does not improve in one week.  You have night pain.  You lose weight.  You have a fever or chills. SEEK IMMEDIATE MEDICAL CARE IF:   You develop new bowel or bladder control problems.  You have unusual weakness or numbness in your arms or legs.  You develop nausea or vomiting.  You develop abdominal pain.  You feel faint.   This information is not intended to replace advice given to you by your health care provider. Make sure you discuss any questions you have with your health care  provider.   Document Released: 09/02/2005 Document Revised: 09/23/2014 Document Reviewed: 01/04/2014 Elsevier Interactive Patient Education [employer].

## 2015-06-28 NOTE — ED Provider Notes (Signed)
CSN: [id number]     Arrival date & time 06/28/15  1725 History   First MD Initiated Contact with Patient 06/28/15 1732     Chief Complaint  Patient presents with  . Back Pain     (Consider location/radiation/quality/duration/timing/severity/associated sxs/prior Treatment) HPI Comments: The patient is a morbidly obese 29 year old female who presents after having ongoing back pain. 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 She has no numbness in her feet though she does have numbness in her bilateral thighs. She denies any urinary difficulties, has not had fever, she does report having some intermittent urinary frequency. There has been no retention, no incontinence, no fevers, no history of IV drug use, no history of cancer. She has had plain film imaging showing no signs of fracture or dislocation in the last 2 weeks.  She has also tried taking ibuprofen, Robaxin and hydrocodone each with some relief but the pain continues to come back.  Patient is a 29 y.o. female presenting with back pain. The history is provided by the patient and medical records.  Back Pain   Past Medical History  Diagnosis Date  . Anxiety   . Obesity   . Tobacco abuse   . Ovarian cyst   . Endometriosis   . Chronic abdominal pain   . Chronic headaches    Past Surgical History  Procedure Laterality Date  . Femur fracture surgery Left   . Knee arthroscopy Left   . Tubal ligation    . Cesarean section  2008    [place]  . Cholecystectomy N/A 11/25/2014    Procedure: LAPAROSCOPIC CHOLECYSTECTOMY;  Surgeon: Aviva Signs Md, MD;  Location: [facility];  Service: General;  Laterality: N/A;    Family History  Problem Relation Age of Onset  . Hypertension Mother   . Hyperlipidemia Mother   . Fibromyalgia Mother   . Other Mother     degenerative disc disease  . Diabetes Father   . Hypertension Father   . Hyperlipidemia Father   . Heart disease Father   . Heart attack Father   . Stroke Father   . Hyperlipidemia Brother   . Hypertension Brother    Social History  Substance Use Topics  . Smoking status: Current Every Day Smoker -- 0.50 packs/day for 11 years    Types: Cigarettes  . Smokeless tobacco: Never Used  . Alcohol Use: No   OB History    No data available     Review of Systems  Musculoskeletal: Positive for back pain.  All other systems reviewed and are negative.     Allergies  Review of patient's allergies indicates no known allergies.  Home Medications   Prior to Admission medications   Medication Sig Start Date End Date Taking? Authorizing Provider  ibuprofen (ADVIL,MOTRIN) 200 MG tablet Take 400 mg by mouth 3 (three) times daily.    Yes Historical Provider, MD  HYDROcodone-acetaminophen (NORCO/VICODIN) 5-325 MG tablet Take 2 tablets by mouth every 4 (four) hours as needed. 06/28/15   Noemi Chapel, MD  methocarbamol (ROBAXIN) 500 MG tablet Take 1 tablet (500 mg total) by mouth 2 (two) times  daily as needed for muscle spasms. 06/28/15   Noemi Chapel, MD  naproxen (NAPROSYN) 500 MG tablet Take 1 tablet (500 mg total) by mouth 2 (two) times daily with a meal. 06/28/15   Noemi Chapel, MD   BP 134/82 mmHg  Pulse 100  Temp(Src) 98.1 F (36.7 C) (Oral)  Resp 14  Ht 5\' 5"  (1.651 m)  Wt 280 lb (127.007 kg)  BMI 46.59 kg/m2  SpO2 97% Physical Exam  Constitutional: She appears well-developed and well-nourished. No distress.  HENT:  Head: Normocephalic and atraumatic.  Mouth/Throat: Oropharynx is clear and moist. No oropharyngeal exudate.  Eyes: Conjunctivae and EOM are normal. Pupils are equal, round, and reactive to light. Right eye exhibits no  discharge. Left eye exhibits no discharge. No scleral icterus.  Neck: Normal range of motion. Neck supple. No JVD present. No thyromegaly present.  Cardiovascular: Regular rhythm, normal heart sounds and intact distal pulses.  Exam reveals no gallop and no friction rub.   No murmur heard. Mild tachycardia  Pulmonary/Chest: Effort normal and breath sounds normal. No respiratory distress. She has no wheezes. She has no rales.  Abdominal: Soft. Bowel sounds are normal. She exhibits no distension and no mass. There is no tenderness.  Musculoskeletal: Normal range of motion. She exhibits tenderness (focal tenderness to palpation over the lumbar spine as well as the right-sided paraspinal muscles). She exhibits no edema.  Lymphadenopathy:    She has no cervical adenopathy.  Neurological: She is alert. Coordination normal.  Able to straight leg raise bilaterally against resistance though this does cause pain in her back. She has positive pain with passive straight leg raise bilaterally both ipsilateral and contralateral, has normal sensation below the knees, decreased sensation over the bilateral patellas, normal strength at the hips knees and ankles to both flexion and extension bilaterally.  Skin: Skin is warm and dry. No rash noted. No erythema.  Psychiatric: She has a normal mood and affect. Her behavior is normal.  Nursing note and vitals reviewed.   ED Course  Procedures (including critical care time) Labs Review Labs Reviewed  URINALYSIS, ROUTINE W REFLEX MICROSCOPIC (NOT AT [facility])    Imaging Review Mr Lumbar Spine Wo Contrast  06/28/2015  CLINICAL DATA:  Progressive low back pain extending into the lower extremities bilaterally. Pain and numbness within the lower extremities. EXAM: MRI LUMBAR SPINE WITHOUT CONTRAST TECHNIQUE: Multiplanar, multisequence MR imaging of the lumbar spine was performed. No intravenous contrast was administered. COMPARISON:  Lumbar spine radiographs 06/25/2015.  FINDINGS: Normal signal is present in the conus medullaris which terminates at T12-L1. Minimal endplate marrow changes are seen posteriorly at L2-3. There is some straightening of the normal lumbar lordosis. Limited imaging of the abdomen is unremarkable. L1-2:  Negative. L2-3: A mild broad-based disc bulge is present. A central annular tear is present. There is no focal stenosis. L3-4: A shallow rightward disc protrusion and annular tear are present without significant stenosis. L4-5: A shallow rightward disc protrusion is present. There is potential contact of the traversing right L5 nerve root. The foramina are patent bilaterally. L5-S1:  Negative. IMPRESSION: 1. Shallow rightward disc protrusion at L4-5 with potential contact of the traversing right L5 nerve root. 2. Shallow rightward disc protrusion and annular tear at L3-4 without significant stenosis. 3. Mild broad-based disc bulge and central annular tear at L2-3 without significant stenosis. Electronically Signed   By: San Morelle M.D.   On: 06/28/2015 18:41   I have personally reviewed and evaluated these images and  lab results as part of my medical decision-making.    MDM   Final diagnoses:  Back pain    Focal pain in the lower back, this is ongoing and having progressive neurologic symptoms. We'll obtain an MRI to rule out pathologic findings causing neurologic decline. That being said she does have normal strength and no other high risk or complex pathologic features of her back pain. Pain medications given as below.  The patient's MRI was completed, 7:50 PM, patient informed of her results, given a copy of her MRI report and will be discharged home in stable condition. She will be given medications as below, she will be referred to local family doctors. She expresses her understanding.  Meds given in ED:  Medications  HYDROmorphone (DILAUDID) injection 2 mg (2 mg Intramuscular Given 06/28/15 1755)    New Prescriptions    HYDROCODONE-ACETAMINOPHEN (NORCO/VICODIN) 5-325 MG TABLET    Take 2 tablets by mouth every 4 (four) hours as needed.   METHOCARBAMOL (ROBAXIN) 500 MG TABLET    Take 1 tablet (500 mg total) by mouth 2 (two) times daily as needed for muscle spasms.   NAPROXEN (NAPROSYN) 500 MG TABLET    Take 1 tablet (500 mg total) by mouth 2 (two) times daily with a meal.        Noemi Chapel, MD 06/28/15 [phone]

## 2015-08-03 ENCOUNTER — Emergency Department (HOSPITAL_COMMUNITY): Payer: Medicaid Other

## 2015-08-03 ENCOUNTER — Encounter (HOSPITAL_COMMUNITY): Payer: Self-pay

## 2015-08-03 ENCOUNTER — Emergency Department (HOSPITAL_COMMUNITY)
Admission: EM | Admit: 2015-08-03 | Discharge: 2015-08-03 | Disposition: A | Discharge status: Home / Self Care | Payer: Medicaid Other | Attending: Emergency Medicine | Admitting: Emergency Medicine

## 2015-08-03 DIAGNOSIS — E669 Obesity, unspecified: Secondary | ICD-10-CM | POA: Insufficient documentation

## 2015-08-03 DIAGNOSIS — R112 Nausea with vomiting, unspecified: Secondary | ICD-10-CM | POA: Insufficient documentation

## 2015-08-03 DIAGNOSIS — J3489 Other specified disorders of nose and nasal sinuses: Secondary | ICD-10-CM | POA: Insufficient documentation

## 2015-08-03 DIAGNOSIS — R05 Cough: Secondary | ICD-10-CM | POA: Diagnosis not present

## 2015-08-03 DIAGNOSIS — Z8659 Personal history of other mental and behavioral disorders: Secondary | ICD-10-CM | POA: Insufficient documentation

## 2015-08-03 DIAGNOSIS — G8929 Other chronic pain: Secondary | ICD-10-CM | POA: Diagnosis not present

## 2015-08-03 DIAGNOSIS — Z791 Long term (current) use of non-steroidal anti-inflammatories (NSAID): Secondary | ICD-10-CM | POA: Diagnosis not present

## 2015-08-03 DIAGNOSIS — F17210 Nicotine dependence, cigarettes, uncomplicated: Secondary | ICD-10-CM | POA: Diagnosis not present

## 2015-08-03 DIAGNOSIS — R509 Fever, unspecified: Secondary | ICD-10-CM | POA: Insufficient documentation

## 2015-08-03 DIAGNOSIS — Z8742 Personal history of other diseases of the female genital tract: Secondary | ICD-10-CM | POA: Diagnosis not present

## 2015-08-03 DIAGNOSIS — R197 Diarrhea, unspecified: Secondary | ICD-10-CM | POA: Diagnosis not present

## 2015-08-03 DIAGNOSIS — L538 Other specified erythematous conditions: Secondary | ICD-10-CM | POA: Diagnosis not present

## 2015-08-03 DIAGNOSIS — R0981 Nasal congestion: Secondary | ICD-10-CM

## 2015-08-03 LAB — COMPREHENSIVE METABOLIC PANEL
ALT: 26 U/L (ref 14–54)
AST: 25 U/L (ref 15–41)
Albumin: 3.6 g/dL (ref 3.5–5.0)
Alkaline Phosphatase: 73 U/L (ref 38–126)
Anion gap: 4 — ABNORMAL LOW (ref 5–15)
BUN: 8 mg/dL (ref 6–20)
CO2: 24 mmol/L (ref 22–32)
Calcium: 8.2 mg/dL — ABNORMAL LOW (ref 8.9–10.3)
Chloride: 111 mmol/L (ref 101–111)
Creatinine, Ser: 0.56 mg/dL (ref 0.44–1.00)
GFR calc Af Amer: >60 mL/min (ref >60)
GFR calc non Af Amer: >60 mL/min (ref >60)
Glucose, Bld: 114 mg/dL — ABNORMAL HIGH (ref 65–99)
Potassium: 4.1 mmol/L (ref 3.5–5.1)
Sodium: 139 mmol/L (ref 135–145)
Total Bilirubin: 0.4 mg/dL (ref 0.3–1.2)
Total Protein: 6.6 g/dL (ref 6.5–8.1)

## 2015-08-03 LAB — CBC WITH DIFFERENTIAL/PLATELET
Basophils Absolute: 0 10*3/uL (ref 0.0–0.1)
Basophils Relative: 0 %
Eosinophils Absolute: 0.2 10*3/uL (ref 0.0–0.7)
Eosinophils Relative: 2 %
HCT: 39.7 % (ref 36.0–46.0)
Hemoglobin: 12.9 g/dL (ref 12.0–15.0)
Lymphocytes Relative: 26 %
Lymphs Abs: 2.7 10*3/uL (ref 0.7–4.0)
MCH: 26.9 pg (ref 26.0–34.0)
MCHC: 32.5 g/dL (ref 30.0–36.0)
MCV: 82.7 fL (ref 78.0–100.0)
Monocytes Absolute: 0.7 10*3/uL (ref 0.1–1.0)
Monocytes Relative: 7 %
Neutro Abs: 6.7 10*3/uL (ref 1.7–7.7)
Neutrophils Relative %: 65 %
Platelets: 261 10*3/uL (ref 150–400)
RBC: 4.8 MIL/uL (ref 3.87–5.11)
RDW: 14.6 % (ref 11.5–15.5)
WBC: 10.3 10*3/uL (ref 4.0–10.5)

## 2015-08-03 LAB — LIPASE, BLOOD: Lipase: 23 U/L (ref 11–51)

## 2015-08-03 MED ORDER — PSEUDOEPHEDRINE HCL 30 MG PO TABS: 30.0000 mg | ORAL_TABLET | Freq: Two times a day (BID) | ORAL | Status: AC

## 2015-08-03 MED ORDER — SODIUM CHLORIDE 0.9 % IV BOLUS (SEPSIS)
500.0000 mL | Freq: Once | INTRAVENOUS | Status: AC
  Administered 2015-08-03: 500 mL via INTRAVENOUS

## 2015-08-03 MED ORDER — ONDANSETRON HCL 4 MG/2ML IJ SOLN
4.0000 mg | Freq: Once | INTRAMUSCULAR | Status: AC
Start: 2015-08-03 — End: 2015-08-03
  Administered 2015-08-03: 4 mg via INTRAVENOUS
  Filled 2015-08-03: qty 2

## 2015-08-03 MED ORDER — PSEUDOEPHEDRINE HCL 60 MG PO TABS
60.0000 mg | ORAL_TABLET | Freq: Once | ORAL | Status: AC
  Administered 2015-08-03: 60 mg via ORAL
  Filled 2015-08-03: qty 1

## 2015-08-03 MED ORDER — KETOROLAC TROMETHAMINE 30 MG/ML IJ SOLN
30.0000 mg | Freq: Once | INTRAMUSCULAR | Status: AC
  Administered 2015-08-03: 30 mg via INTRAVENOUS
  Filled 2015-08-03: qty 1

## 2015-08-03 MED ORDER — ONDANSETRON 4 MG PO TBDP: 4.0000 mg | ORAL_TABLET | Freq: Three times a day (TID) | ORAL | Status: DC | PRN

## 2015-08-03 NOTE — ED Notes (Signed)
MD at bedside. 

## 2015-08-03 NOTE — Discharge Instructions (Signed)
As discussed, your evaluation today has been largely reassuring.  But, it is important that you monitor your condition carefully, and do not hesitate to return to the ED if you develop new, or concerning changes in your condition. ? ?Otherwise, please follow-up with your physician for appropriate ongoing care. ? ?

## 2015-08-03 NOTE — ED Notes (Signed)
Pt reports was given a tb skin test last week that was negative, then reports had another test that was to check to see if she had been exposed to tb on Tuesday.  Pt says since then she has had cough, congestion, n/v, night sweats.

## 2015-08-03 NOTE — ED Provider Notes (Signed)
CSN: [id number]     Arrival date & time 08/03/15  0800 History   First MD Initiated Contact with Patient 08/03/15 [phone]     Chief Complaint  Patient presents with  . Cough    HPI  Patient presents with concern of nausea, vomiting, diarrhea. Patient was generally well until yesterday. However, the patient had some medical procedures performed last week, and 2 days ago, that are unusual. Last week, for employment purposes she had a right forearm PPD test placed. On reevaluation 3 days ago the patient was told that she had negative result, however the following day she received another PPD on the left forearm, was told that she was being checked for possible exposure. In the following day patient felt nausea, vomiting, diarrhea, subjective fever. No chest pain, persistent belly pain, headache, confusion, disorientation, objective fever. No medication taken for symptom control. Patient works in a nursing facility. No history of travel to areas with tuberculosis, nor incarceration.  Past Medical History  Diagnosis Date  . Anxiety   . Obesity   . Tobacco abuse   . Ovarian cyst   . Endometriosis   . Chronic abdominal pain   . Chronic headaches    Past Surgical History  Procedure Laterality Date  . Femur fracture surgery Left   . Knee arthroscopy Left   . Tubal ligation    . Cesarean section  2008    [place]  . Cholecystectomy N/A 11/25/2014    Procedure: LAPAROSCOPIC CHOLECYSTECTOMY;  Surgeon: Aviva Signs Md, MD;  Location: [facility];  Service: General;  Laterality: N/A;   Family History  Problem Relation Age of Onset  . Hypertension Mother   . Hyperlipidemia Mother   . Fibromyalgia Mother   . Other Mother     degenerative disc disease  . Diabetes Father   . Hypertension Father   . Hyperlipidemia Father   . Heart disease Father   . Heart attack Father   . Stroke Father   . Hyperlipidemia Brother   . Hypertension Brother    Social History  Substance Use Topics  . Smoking  status: Current Every Day Smoker -- 0.50 packs/day for 11 years    Types: Cigarettes  . Smokeless tobacco: Never Used  . Alcohol Use: No   OB History    No data available     Review of Systems  Constitutional:       Per HPI, otherwise negative  HENT:       Per HPI, otherwise negative  Respiratory:       Per HPI, otherwise negative  Cardiovascular:       Per HPI, otherwise negative  Gastrointestinal: Positive for nausea and vomiting.  Endocrine:       Negative aside from HPI  Genitourinary:       Neg aside from HPI   Musculoskeletal:       Per HPI, otherwise negative  Skin: Positive for color change.  Allergic/Immunologic: Negative for immunocompromised state.  Neurological: Negative for syncope.      Allergies  Review of patient's allergies indicates no known allergies.  Home Medications   Prior to Admission medications   Medication Sig Start Date End Date Taking? Authorizing Provider  methocarbamol (ROBAXIN) 500 MG tablet Take 1 tablet (500 mg total) by mouth 2 (two) times daily as needed for muscle spasms. 06/28/15  Yes Noemi Chapel, MD  naproxen (NAPROSYN) 500 MG tablet Take 1 tablet (500 mg total) by mouth 2 (two) times daily with a meal. 06/28/15  Yes Noemi Chapel, MD  HYDROcodone-acetaminophen (NORCO/VICODIN) 5-325 MG tablet Take 2 tablets by mouth every 4 (four) hours as needed. Patient not taking: Reported on 08/03/2015 06/28/15   Noemi Chapel, MD   BP 121/66 mmHg  Pulse 74  Temp(Src) 98.2 F (36.8 C) (Oral)  Resp 18  Ht 5\' 5"  (1.651 m)  Wt 280 lb (127.007 kg)  BMI 46.59 kg/m2  SpO2 100%  LMP 07/28/2015 Physical Exam  Constitutional: She is oriented to person, place, and time. She appears well-developed and well-nourished. No distress.  HENT:  Head: Normocephalic and atraumatic.  Mouth/Throat: Oropharynx is clear and moist. No oropharyngeal exudate.  Eyes: Conjunctivae and EOM are normal.  Cardiovascular: Normal rate and regular rhythm.     Pulmonary/Chest: Effort normal and breath sounds normal. No stridor. No respiratory distress.  Abdominal: She exhibits no distension.  Musculoskeletal: She exhibits no edema.  Neurological: She is alert and oriented to person, place, and time. No cranial nerve deficit.  Skin: Skin is warm and dry.     Psychiatric: She has a normal mood and affect.  Nursing note and vitals reviewed.   ED Course  Procedures (including critical care time) Labs Review Labs Reviewed  COMPREHENSIVE METABOLIC PANEL - Abnormal; Notable for the following:    Glucose, Bld 114 (*)    Calcium 8.2 (*)    Anion gap 4 (*)    All other components within normal limits  CBC WITH DIFFERENTIAL/PLATELET  LIPASE, BLOOD    Imaging Review Dg Chest 2 View  08/03/2015  CLINICAL DATA:  Cough and congestion.  Smoker. EXAM: CHEST  2 VIEW COMPARISON:  11/25/2014 FINDINGS: There is no focal parenchymal opacity. There is no pleural effusion or pneumothorax. There is mild stable cardiomegaly. The osseous structures are unremarkable. IMPRESSION: No active cardiopulmonary disease. Electronically Signed   By: Kathreen Devoid   On: 08/03/2015 09:07   I have personally reviewed and evaluated these images and lab results as part of my medical decision-making.  10:07 AM Reexam the patient states that she feels better. We had a lengthy conversation about all results, need for monitoring, medication.   MDM  Patient presents with concern of nausea, congestion, cough. Patient does have recent to step PPD evaluation at her place of employment, and today's presentation may be secondary to heightened immune response. No evidence for pneumonia, bacteremia or sepsis. No evidence for deep oral pharyngeal pathology. No evidence for imminent decompensation. Patient improved here with fluids, antiemetics, decongestion. Patient discharged in stable condition.  Carmin Muskrat, MD 08/03/15 [phone]

## 2015-08-20 ENCOUNTER — Emergency Department (HOSPITAL_COMMUNITY): Payer: Medicaid Other

## 2015-08-20 ENCOUNTER — Emergency Department (HOSPITAL_COMMUNITY)
Admission: EM | Admit: 2015-08-20 | Discharge: 2015-08-20 | Disposition: A | Discharge status: Home / Self Care | Payer: Medicaid Other | Attending: Emergency Medicine | Admitting: Emergency Medicine

## 2015-08-20 ENCOUNTER — Encounter (HOSPITAL_COMMUNITY): Payer: Self-pay | Admitting: Emergency Medicine

## 2015-08-20 DIAGNOSIS — R51 Headache: Secondary | ICD-10-CM | POA: Diagnosis not present

## 2015-08-20 DIAGNOSIS — E669 Obesity, unspecified: Secondary | ICD-10-CM | POA: Diagnosis not present

## 2015-08-20 DIAGNOSIS — Z7982 Long term (current) use of aspirin: Secondary | ICD-10-CM | POA: Diagnosis not present

## 2015-08-20 DIAGNOSIS — F419 Anxiety disorder, unspecified: Secondary | ICD-10-CM | POA: Diagnosis not present

## 2015-08-20 DIAGNOSIS — F411 Generalized anxiety disorder: Secondary | ICD-10-CM

## 2015-08-20 DIAGNOSIS — Z8742 Personal history of other diseases of the female genital tract: Secondary | ICD-10-CM | POA: Insufficient documentation

## 2015-08-20 DIAGNOSIS — F17210 Nicotine dependence, cigarettes, uncomplicated: Secondary | ICD-10-CM | POA: Insufficient documentation

## 2015-08-20 DIAGNOSIS — F430 Acute stress reaction: Secondary | ICD-10-CM

## 2015-08-20 DIAGNOSIS — Z791 Long term (current) use of non-steroidal anti-inflammatories (NSAID): Secondary | ICD-10-CM | POA: Insufficient documentation

## 2015-08-20 DIAGNOSIS — R0789 Other chest pain: Secondary | ICD-10-CM | POA: Diagnosis not present

## 2015-08-20 DIAGNOSIS — R5383 Other fatigue: Secondary | ICD-10-CM | POA: Insufficient documentation

## 2015-08-20 DIAGNOSIS — G8929 Other chronic pain: Secondary | ICD-10-CM | POA: Insufficient documentation

## 2015-08-20 DIAGNOSIS — R42 Dizziness and giddiness: Secondary | ICD-10-CM | POA: Insufficient documentation

## 2015-08-20 DIAGNOSIS — R079 Chest pain, unspecified: Secondary | ICD-10-CM | POA: Diagnosis present

## 2015-08-20 LAB — I-STAT TROPONIN, ED
TROPONIN I, POC: 0 ng/mL (ref 0.00–0.08)
TROPONIN I, POC: 0 ng/mL (ref 0.00–0.08)

## 2015-08-20 LAB — I-STAT CHEM 8, ED
BUN: 8 mg/dL (ref 6–20)
CHLORIDE: 102 mmol/L (ref 101–111)
Calcium, Ion: 1.16 mmol/L (ref 1.12–1.23)
Creatinine, Ser: 0.6 mg/dL (ref 0.44–1.00)
Glucose, Bld: 151 mg/dL — ABNORMAL HIGH (ref 65–99)
HCT: 42 % (ref 36.0–46.0)
HEMOGLOBIN: 14.3 g/dL (ref 12.0–15.0)
POTASSIUM: 4.1 mmol/L (ref 3.5–5.1)
SODIUM: 140 mmol/L (ref 135–145)
TCO2: 24 mmol/L (ref 0–100)

## 2015-08-20 LAB — POC URINE PREG, ED: PREG TEST UR: NEGATIVE

## 2015-08-20 MED ORDER — LORAZEPAM 1 MG PO TABS
1.0000 mg | ORAL_TABLET | Freq: Once | ORAL | Status: AC
  Administered 2015-08-20: 1 mg via ORAL
  Filled 2015-08-20: qty 1

## 2015-08-20 MED ORDER — ACETAMINOPHEN 500 MG PO TABS
1000.0000 mg | ORAL_TABLET | Freq: Once | ORAL | Status: AC
  Administered 2015-08-20: 1000 mg via ORAL
  Filled 2015-08-20: qty 2

## 2015-08-20 NOTE — ED Notes (Signed)
MD at bedside. 

## 2015-08-20 NOTE — Discharge Instructions (Signed)
If you were given medicines take as directed.  If you are on coumadin or contraceptives realize their levels and effectiveness is altered by many different medicines.  If you have any reaction (rash, tongues swelling, other) to the medicines stop taking and see a physician.    If your blood pressure was elevated in the ER make sure you follow up for management with a primary doctor or return for chest pain, shortness of breath or stroke symptoms.  Please follow up as directed and return to the ER or see a physician for new or worsening symptoms.  Thank you. Filed Vitals:   08/20/15 0941  BP: 143/85  Pulse: 97  Temp: 98.2 F (36.8 C)  TempSrc: Oral  Resp: 18  Height: 5\' 5"  (1.651 m)  Weight: 280 lb (127.007 kg)  SpO2: 99%

## 2015-08-20 NOTE — ED Notes (Signed)
D/c papers given and reviewed. Pt. Verbalized understanding.

## 2015-08-20 NOTE — ED Provider Notes (Signed)
CSN: [id number]     Arrival date & time 08/20/15  0935 History  By signing my name below, I, [name], attest that this documentation has been prepared under the direction and in the presence of Elnora Morrison, MD. Electronically Signed: [name], ED Scribe. 08/20/2015. 9:52 AM.  Chief Complaint  Patient presents with  . Panic Attack   The history is provided by the patient. No language interpreter was used.     HPI Comments: Patricia Stanton is a 29 y.o. female with hx anxiety who presents to the Emergency Department complaining of sudden onset, constant, chest pressure that began this morning around 6 AM (approximately 3.5 hours ago) Pt also complains of fatigue, dizziness, lightheadedness, headache, and chills that began around the same time as the chest pressure. She reports similar symptoms in the past when she has increased stress in her life. She states that a lot has been going on in her life, including attempting to take care of her mother and father who are not in good health. Denies fever, leg swelling, nausea, vomiting, diaphoresis, shortness of breath, or any other associated symptoms. No recent surgery, hx DVT/PE, or hx cardiac issues. FHx cardiac issues with father at age 61. Pt is current every day smoker.   Past Medical History  Diagnosis Date  . Anxiety   . Obesity   . Tobacco abuse   . Ovarian cyst   . Endometriosis   . Chronic abdominal pain   . Chronic headaches    Past Surgical History  Procedure Laterality Date  . Femur fracture surgery Left   . Knee arthroscopy Left   . Tubal ligation    . Cesarean section  2008    [place]  . Cholecystectomy N/A 11/25/2014    Procedure: LAPAROSCOPIC CHOLECYSTECTOMY;  Surgeon: Aviva Signs Md, MD;  Location: [facility];  Service: General;  Laterality: N/A;   Family History  Problem Relation Age of Onset  . Hypertension Mother   . Hyperlipidemia Mother   . Fibromyalgia Mother   . Other Mother     degenerative disc disease   . Diabetes Father   . Hypertension Father   . Hyperlipidemia Father   . Heart disease Father   . Heart attack Father   . Stroke Father   . Hyperlipidemia Brother   . Hypertension Brother    Social History  Substance Use Topics  . Smoking status: Current Every Day Smoker -- 0.50 packs/day for 11 years    Types: Cigarettes  . Smokeless tobacco: Never Used  . Alcohol Use: No   OB History    No data available     Review of Systems  Constitutional: Positive for chills and fatigue. Negative for fever and diaphoresis.  Respiratory: Negative for shortness of breath.   Cardiovascular: Positive for chest pain. Negative for leg swelling.  Gastrointestinal: Negative for nausea and vomiting.  Neurological: Positive for dizziness, light-headedness and headaches.  All other systems reviewed and are negative.     Allergies  Review of patient's allergies indicates no known allergies.  Home Medications   Prior to Admission medications   Medication Sig Start Date End Date Taking? Authorizing Provider  aspirin EC 81 MG tablet Take 81 mg by mouth daily.   Yes Historical Provider, MD  naproxen (NAPROSYN) 500 MG tablet Take 1 tablet (500 mg total) by mouth 2 (two) times daily with a meal. 06/28/15  Yes Noemi Chapel, MD  HYDROcodone-acetaminophen (NORCO/VICODIN) 5-325 MG tablet Take 2 tablets by mouth every  4 (four) hours as needed. Patient not taking: Reported on 08/03/2015 06/28/15   Noemi Chapel, MD  methocarbamol (ROBAXIN) 500 MG tablet Take 1 tablet (500 mg total) by mouth 2 (two) times daily as needed for muscle spasms. Patient not taking: Reported on 08/20/2015 06/28/15   Noemi Chapel, MD  ondansetron (ZOFRAN ODT) 4 MG disintegrating tablet Take 1 tablet (4 mg total) by mouth every 8 (eight) hours as needed for nausea or vomiting. Patient not taking: Reported on 08/20/2015 08/03/15   Carmin Muskrat, MD   Triage VItals: BP 143/85 mmHg  Pulse 97  Temp(Src) 98.2 F (36.8 C) (Oral)   Resp 18  Ht 5\' 5"  (1.651 m)  Wt 280 lb (127.007 kg)  BMI 46.59 kg/m2  SpO2 99%  LMP 07/28/2015   Physical Exam  Constitutional: She is oriented to person, place, and time. She appears well-developed and well-nourished. No distress.  HENT:  Head: Normocephalic and atraumatic.  Eyes: Conjunctivae and EOM are normal.  Neck: Neck supple. No tracheal deviation present.  Cardiovascular: Normal rate and regular rhythm.   Pulmonary/Chest: Effort normal and breath sounds normal. No respiratory distress. She has no wheezes. She has no rhonchi. She has no rales.  Abdominal: Soft. There is tenderness in the epigastric area.  Musculoskeletal: Normal range of motion.  No significant swelling to the legs  Neurological: She is alert and oriented to person, place, and time.  Skin: Skin is warm and dry.  Psychiatric: She has a normal mood and affect. Her behavior is normal.  Nursing note and vitals reviewed.   ED Course  Procedures (including critical care time)  DIAGNOSTIC STUDIES: Oxygen Saturation is 99% on RA, normal by my interpretation.    COORDINATION OF CARE: 9:50 AM-Discussed treatment plan which includes CXR, Chem 8, and Troponin with pt at bedside and pt agreed to plan.   Labs Review Labs Reviewed  I-STAT CHEM 8, ED - Abnormal; Notable for the following:    Glucose, Bld 151 (*)    All other components within normal limits  I-STAT TROPOININ, ED  POC URINE PREG, ED  Randolm Idol, ED    Imaging Review Dg Chest 2 View  08/20/2015  CLINICAL DATA:  Sudden onset constant chest pressure. Fatigue, dizziness, lightheadedness, headache and chills with the chest pressure. EXAM: CHEST  2 VIEW COMPARISON:  Chest x-ray dated 08/03/2015. FINDINGS: Study is hypoinspiratory with crowding of the perihilar and bibasilar bronchovascular markings. Given the low lung volumes, lungs appear clear. No evidence of pneumonia. No pleural effusion. No pneumothorax seen. Also given the low lung volumes,  cardiomediastinal silhouette is within normal limits in size and configuration. Osseous and soft tissue structures about the chest are unremarkable. IMPRESSION: Stable chest x-ray. No evidence of acute cardiopulmonary abnormality. Electronically Signed   By: Franki Cabot M.D.   On: 08/20/2015 10:18   I have personally reviewed and evaluated these images and lab results as part of my medical decision-making.   EKG Interpretation   Date/Time:  Sunday August 20 2015 10:29:57 EST Ventricular Rate:  86 PR Interval:  149 QRS Duration: 91 QT Interval:  361 QTC Calculation: 432 R Axis:   42 Text Interpretation:  Sinus rhythm similar previous, no acute findings  Confirmed by Tyauna Lacaze  MD, Taos Tapp ([id number]) on 08/20/2015 11:41:03 AM      MDM   Final diagnoses:  Anxiety as acute reaction to gross stress  Chest pain, atypical   I personally performed the services described in this documentation, which was scribed  in my presence. The recorded information has been reviewed and is accurate.  Patient presents with clinical concern for stress as the cause of her multiple different symptoms. However with patient having a few crit risk factors plan for troponin, EKG and chest x-ray. Chest x-ray reviewed no acute findings, troponin negative. Plan for second troponin and close follow-up outpatient. Patient improved on reassessment. No concern for blood clot at this time.  Results and differential diagnosis were discussed with the patient/parent/guardian. Xrays were independently reviewed by myself.  Close follow up outpatient was discussed, comfortable with the plan.   Medications  LORazepam (ATIVAN) tablet 1 mg (1 mg Oral Given 08/20/15 1003)  acetaminophen (TYLENOL) tablet 1,000 mg (1,000 mg Oral Given 08/20/15 1105)    Filed Vitals:   08/20/15 1045 08/20/15 1100 08/20/15 1115 08/20/15 1130  BP:  132/87  124/85  Pulse: 91 87 84 88  Temp:      TempSrc:      Resp: 26  26 25   Height:      Weight:       SpO2: 97% 99% 97% 97%    Final diagnoses:  Anxiety as acute reaction to gross stress  Chest pain, atypical       Elnora Morrison, MD 08/20/15 1224

## 2015-08-20 NOTE — ED Notes (Signed)
2nd i-stat troponin result 0.00.

## 2015-08-22 ENCOUNTER — Emergency Department (HOSPITAL_COMMUNITY): Payer: Medicaid Other

## 2015-08-22 ENCOUNTER — Emergency Department (HOSPITAL_COMMUNITY)
Admission: EM | Admit: 2015-08-22 | Discharge: 2015-08-22 | Disposition: A | Discharge status: Home / Self Care | Payer: Medicaid Other | Attending: Emergency Medicine | Admitting: Emergency Medicine

## 2015-08-22 ENCOUNTER — Encounter (HOSPITAL_COMMUNITY): Payer: Self-pay | Admitting: *Deleted

## 2015-08-22 DIAGNOSIS — F17210 Nicotine dependence, cigarettes, uncomplicated: Secondary | ICD-10-CM | POA: Diagnosis not present

## 2015-08-22 DIAGNOSIS — Z7982 Long term (current) use of aspirin: Secondary | ICD-10-CM | POA: Insufficient documentation

## 2015-08-22 DIAGNOSIS — F419 Anxiety disorder, unspecified: Secondary | ICD-10-CM | POA: Insufficient documentation

## 2015-08-22 DIAGNOSIS — R0602 Shortness of breath: Secondary | ICD-10-CM | POA: Diagnosis not present

## 2015-08-22 DIAGNOSIS — R079 Chest pain, unspecified: Secondary | ICD-10-CM | POA: Insufficient documentation

## 2015-08-22 DIAGNOSIS — G8929 Other chronic pain: Secondary | ICD-10-CM | POA: Insufficient documentation

## 2015-08-22 DIAGNOSIS — Z8742 Personal history of other diseases of the female genital tract: Secondary | ICD-10-CM | POA: Diagnosis not present

## 2015-08-22 DIAGNOSIS — Z3202 Encounter for pregnancy test, result negative: Secondary | ICD-10-CM | POA: Diagnosis not present

## 2015-08-22 DIAGNOSIS — R202 Paresthesia of skin: Secondary | ICD-10-CM | POA: Diagnosis not present

## 2015-08-22 DIAGNOSIS — E669 Obesity, unspecified: Secondary | ICD-10-CM | POA: Insufficient documentation

## 2015-08-22 DIAGNOSIS — R002 Palpitations: Secondary | ICD-10-CM | POA: Diagnosis not present

## 2015-08-22 DIAGNOSIS — R51 Headache: Secondary | ICD-10-CM | POA: Diagnosis not present

## 2015-08-22 LAB — BASIC METABOLIC PANEL
Anion gap: 8 (ref 5–15)
BUN: 11 mg/dL (ref 6–20)
CALCIUM: 9 mg/dL (ref 8.9–10.3)
CHLORIDE: 104 mmol/L (ref 101–111)
CO2: 25 mmol/L (ref 22–32)
CREATININE: 0.55 mg/dL (ref 0.44–1.00)
GFR calc non Af Amer: >60 mL/min (ref >60)
GLUCOSE: 137 mg/dL — AB (ref 65–99)
Potassium: 4.1 mmol/L (ref 3.5–5.1)
Sodium: 137 mmol/L (ref 135–145)

## 2015-08-22 LAB — CBC WITH DIFFERENTIAL/PLATELET
BASOS PCT: 0 %
Basophils Absolute: 0 10*3/uL (ref 0.0–0.1)
EOS ABS: 0.1 10*3/uL (ref 0.0–0.7)
EOS PCT: 1 %
HCT: 39.5 % (ref 36.0–46.0)
HEMOGLOBIN: 13.3 g/dL (ref 12.0–15.0)
Lymphocytes Relative: 21 %
Lymphs Abs: 2.5 10*3/uL (ref 0.7–4.0)
MCH: 27.8 pg (ref 26.0–34.0)
MCHC: 33.7 g/dL (ref 30.0–36.0)
MCV: 82.5 fL (ref 78.0–100.0)
MONOS PCT: 5 %
Monocytes Absolute: 0.6 10*3/uL (ref 0.1–1.0)
NEUTROS PCT: 73 %
Neutro Abs: 8.8 10*3/uL — ABNORMAL HIGH (ref 1.7–7.7)
PLATELETS: 247 10*3/uL (ref 150–400)
RBC: 4.79 MIL/uL (ref 3.87–5.11)
RDW: 14.6 % (ref 11.5–15.5)
WBC: 12 10*3/uL — AB (ref 4.0–10.5)

## 2015-08-22 LAB — I-STAT TROPONIN, ED: TROPONIN I, POC: 0 ng/mL (ref 0.00–0.08)

## 2015-08-22 LAB — HCG, SERUM, QUALITATIVE: PREG SERUM: NEGATIVE

## 2015-08-22 MED ORDER — LORAZEPAM 1 MG PO TABS
1.0000 mg | ORAL_TABLET | Freq: Once | ORAL | Status: AC
  Administered 2015-08-22: 1 mg via ORAL
  Filled 2015-08-22: qty 1

## 2015-08-22 NOTE — ED Provider Notes (Signed)
CSN: [id number]     Arrival date & time 08/22/15  [id number] History   First MD Initiated Contact with Patient 08/22/15 0700     Chief Complaint  Patient presents with  . Anxiety     (Consider location/radiation/quality/duration/timing/severity/associated sxs/prior Treatment) HPI 29 year old female with history of obesity, tobacco use, and anxiety who presents with shortness of breath and chest pressure. Was seen in the emergency department 2 days ago for panic attack in the setting of increased stressors. She states that her father was hospitalized 2 days ago for stroke, and had left hospital against medical advice. States that this has been increasing her stress, causing her to have decreased appetite, decreased sleep, increasing episodes of panic attacks where she feels short of breath, lightheadedness, bilateral hand tingling, and chest pressure. States that she was referred to a primary care provider, and has been unable to get an appointment for close follow-up. Presents the ED as a result.   Past Medical History  Diagnosis Date  . Anxiety   . Obesity   . Tobacco abuse   . Ovarian cyst   . Endometriosis   . Chronic abdominal pain   . Chronic headaches    Past Surgical History  Procedure Laterality Date  . Femur fracture surgery Left   . Knee arthroscopy Left   . Tubal ligation    . Cesarean section  2008    [place]  . Cholecystectomy N/A 11/25/2014    Procedure: LAPAROSCOPIC CHOLECYSTECTOMY;  Surgeon: Aviva Signs Md, MD;  Location: [facility];  Service: General;  Laterality: N/A;   Family History  Problem Relation Age of Onset  . Hypertension Mother   . Hyperlipidemia Mother   . Fibromyalgia Mother   . Other Mother     degenerative disc disease  . Diabetes Father   . Hypertension Father   . Hyperlipidemia Father   . Heart disease Father   . Heart attack Father   . Stroke Father   . Hyperlipidemia Brother   . Hypertension Brother    Social History  Substance Use Topics  .  Smoking status: Current Every Day Smoker -- 0.50 packs/day for 11 years    Types: Cigarettes  . Smokeless tobacco: Never Used  . Alcohol Use: No   OB History    No data available     Review of Systems  Constitutional: Negative for fever.  Respiratory: Negative for cough.   Cardiovascular: Positive for palpitations. Negative for leg swelling.  Gastrointestinal: Negative for vomiting.  Neurological: Positive for headaches.  Psychiatric/Behavioral: Positive for sleep disturbance and decreased concentration. Negative for suicidal ideas. The patient is nervous/anxious.   All other systems reviewed and are negative.      Allergies  Review of patient's allergies indicates no known allergies.  Home Medications   Prior to Admission medications   Medication Sig Start Date End Date Taking? Authorizing Provider  aspirin EC 81 MG tablet Take 81 mg by mouth daily.   Yes Historical Provider, MD  HYDROcodone-acetaminophen (NORCO/VICODIN) 5-325 MG tablet Take 2 tablets by mouth every 4 (four) hours as needed. Patient not taking: Reported on 08/03/2015 06/28/15   Noemi Chapel, MD  methocarbamol (ROBAXIN) 500 MG tablet Take 1 tablet (500 mg total) by mouth 2 (two) times daily as needed for muscle spasms. Patient not taking: Reported on 08/20/2015 06/28/15   Noemi Chapel, MD  naproxen (NAPROSYN) 500 MG tablet Take 1 tablet (500 mg total) by mouth 2 (two) times daily with a meal. Patient not taking:  Reported on 08/22/2015 06/28/15   Noemi Chapel, MD  ondansetron (ZOFRAN ODT) 4 MG disintegrating tablet Take 1 tablet (4 mg total) by mouth every 8 (eight) hours as needed for nausea or vomiting. Patient not taking: Reported on 08/20/2015 08/03/15   Carmin Muskrat, MD   BP 149/91 mmHg  Pulse 95  Temp(Src) 98.1 F (36.7 C) (Oral)  Resp 18  Ht 5\' 5"  (1.651 m)  Wt 280 lb (127.007 kg)  BMI 46.59 kg/m2  SpO2 97%  LMP 07/28/2015 Physical Exam  Physical Exam  Nursing note and vitals  reviewed. Constitutional: Well developed, well nourished, non-toxic, and in no acute distress Head: Normocephalic and atraumatic.  Mouth/Throat: Oropharynx is clear and moist.  Neck: Normal range of motion. Neck supple.  Cardiovascular: Normal rate and regular rhythm.   Pulmonary/Chest: Effort normal and breath sounds normal.  Abdominal: Soft. There is no tenderness. There is no rebound and no guarding.  Musculoskeletal: Normal range of motion.  Neurological: Alert, no facial droop, fluent speech, moves all extremities symmetrically Skin: Skin is warm and dry.  Psychiatric: Cooperative   ED Course  Procedures (including critical care time) Labs Review Labs Reviewed  CBC WITH DIFFERENTIAL/PLATELET - Abnormal; Notable for the following:    WBC 12.0 (*)    Neutro Abs 8.8 (*)    All other components within normal limits  BASIC METABOLIC PANEL - Abnormal; Notable for the following:    Glucose, Bld 137 (*)    All other components within normal limits  HCG, SERUM, QUALITATIVE  I-STAT TROPOININ, ED    Imaging Review Dg Chest 2 View  08/22/2015  CLINICAL DATA:  Shortness of Breath EXAM: CHEST - 2 VIEW COMPARISON:  08/20/2015 FINDINGS: The heart size and mediastinal contours are within normal limits. Both lungs are clear. The visualized skeletal structures are unremarkable. IMPRESSION: No active disease. Electronically Signed   By: Inez Catalina M.D.   On: 08/22/2015 07:53   Dg Chest 2 View  08/20/2015  CLINICAL DATA:  Sudden onset constant chest pressure. Fatigue, dizziness, lightheadedness, headache and chills with the chest pressure. EXAM: CHEST  2 VIEW COMPARISON:  Chest x-ray dated 08/03/2015. FINDINGS: Study is hypoinspiratory with crowding of the perihilar and bibasilar bronchovascular markings. Given the low lung volumes, lungs appear clear. No evidence of pneumonia. No pleural effusion. No pneumothorax seen. Also given the low lung volumes, cardiomediastinal silhouette is within  normal limits in size and configuration. Osseous and soft tissue structures about the chest are unremarkable. IMPRESSION: Stable chest x-ray. No evidence of acute cardiopulmonary abnormality. Electronically Signed   By: Franki Cabot M.D.   On: 08/20/2015 10:18   I have personally reviewed and evaluated these images and lab results as part of my medical decision-making.   EKG Interpretation   Date/Time:  Tuesday August 22 2015 07:22:11 EST Ventricular Rate:  96 PR Interval:  149 QRS Duration: 88 QT Interval:  355 QTC Calculation: 449 R Axis:   28 Text Interpretation:  Sinus rhythm No significant change since last  tracing Confirmed by Prestyn Stanco MD, Jaydee Conran [phone]) on 08/22/2015 7:29:11 AM      MDM   Final diagnoses:  Anxiety    29 year old female who presents with anxiety in the setting of increasing stressors at home. Is nontoxic and in no acute distress. Vital signs are within normal limits. Exam is unremarkable. Similar to anxiety for which she has had before, and was seen in the emergency department 2 days ago. Patient requesting medications to help with her  anxiety as well as for sleep. Discussed that she will need to follow up with a primary care physician to decide if these medications are appropriate for her. Discussed coping management and behavioral modifications in the interim. Is provided a more comprehensive list of resources for primary care providers and [facility].   EKG here continues to show no acute ischemic changes or stigmata of arrhythmia. Troponin negative and basic blood work unremarkable. I doubt that this is due to underlying ACS, and her overall heart score of 1-2 for risk factors, low risk and ruled out at this time. CXR no acute cardiopulmonary processes. PERC negative, and history/exam not suggestive of that of PE. Low suspicion for serious or life-threatening cause of symptoms at this time. Strict return and follow-up instructions are reviewed. She  expressed understanding of all discharge instructions, and felt comfortable to plan of care.   Forde Dandy, MD 08/22/15 [phone]

## 2015-08-22 NOTE — ED Notes (Addendum)
Pt comes in with anxiety. Pt verbalizes she has had some shortness of breath, hand tingling, leg tingling, and chest pressure. Pt states she was seen for this on Sunday but her symptoms have gotten worse. NAD noted. Pt is calm upon triage.   Pt states her father recently had a stroke and she is under a lot of family stress.

## 2015-08-22 NOTE — Discharge Instructions (Signed)
You're given an extensive list of resources to try for outpatient follow-up. Please return for worsening symptoms, including thoughts of wanting to hurt yourself or others, or any other symptoms concerning to you.  Panic Attacks Panic attacks are sudden, short feelings of great fear or discomfort. You may have them for no reason when you are relaxed, when you are uneasy (anxious), or when you are sleeping.  HOME CARE  Take all your medicines as told.  Check with your doctor before starting new medicines.  Keep all doctor visits. GET HELP IF:  You are not able to take your medicines as told.  Your symptoms do not get better.  Your symptoms get worse. GET HELP RIGHT AWAY IF:  Your attacks seem different than your normal attacks.  You have thoughts about hurting yourself or others.  You take panic attack medicine and you have a side effect. MAKE SURE YOU:  Understand these instructions.  Will watch your condition.  Will get help right away if you are not doing well or get worse.   This information is not intended to replace advice given to you by your health care provider. Make sure you discuss any questions you have with your health care provider.   Document Released: 10/05/2010 Document Revised: 06/23/2013 Document Reviewed: 04/16/2013 Elsevier Interactive Patient Education [employer].

## 2015-12-09 ENCOUNTER — Encounter (HOSPITAL_COMMUNITY): Payer: Self-pay | Admitting: *Deleted

## 2015-12-09 ENCOUNTER — Emergency Department (HOSPITAL_COMMUNITY)
Admission: EM | Admit: 2015-12-09 | Discharge: 2015-12-09 | Disposition: A | Discharge status: Home / Self Care | Payer: Medicaid Other | Attending: Emergency Medicine | Admitting: Emergency Medicine

## 2015-12-09 DIAGNOSIS — F17210 Nicotine dependence, cigarettes, uncomplicated: Secondary | ICD-10-CM | POA: Insufficient documentation

## 2015-12-09 DIAGNOSIS — Z79899 Other long term (current) drug therapy: Secondary | ICD-10-CM | POA: Diagnosis not present

## 2015-12-09 DIAGNOSIS — R112 Nausea with vomiting, unspecified: Secondary | ICD-10-CM | POA: Diagnosis not present

## 2015-12-09 DIAGNOSIS — R1010 Upper abdominal pain, unspecified: Secondary | ICD-10-CM | POA: Diagnosis present

## 2015-12-09 DIAGNOSIS — E669 Obesity, unspecified: Secondary | ICD-10-CM | POA: Insufficient documentation

## 2015-12-09 HISTORY — DX: Unspecified mood (affective) disorder: F39

## 2015-12-09 LAB — COMPREHENSIVE METABOLIC PANEL
ALBUMIN: 3.9 g/dL (ref 3.5–5.0)
ALK PHOS: 69 U/L (ref 38–126)
ALT: 28 U/L (ref 14–54)
AST: 29 U/L (ref 15–41)
Anion gap: 10 (ref 5–15)
BUN: 9 mg/dL (ref 6–20)
CALCIUM: 8.4 mg/dL — AB (ref 8.9–10.3)
CHLORIDE: 107 mmol/L (ref 101–111)
CO2: 22 mmol/L (ref 22–32)
CREATININE: 0.56 mg/dL (ref 0.44–1.00)
GFR calc Af Amer: >60 mL/min (ref >60)
GFR calc non Af Amer: >60 mL/min (ref >60)
GLUCOSE: 160 mg/dL — AB (ref 65–99)
Potassium: 4.1 mmol/L (ref 3.5–5.1)
SODIUM: 139 mmol/L (ref 135–145)
Total Bilirubin: 0.5 mg/dL (ref 0.3–1.2)
Total Protein: 7.2 g/dL (ref 6.5–8.1)

## 2015-12-09 LAB — CBC
HCT: 39.9 % (ref 36.0–46.0)
Hemoglobin: 13.6 g/dL (ref 12.0–15.0)
MCH: 27.7 pg (ref 26.0–34.0)
MCHC: 34.1 g/dL (ref 30.0–36.0)
MCV: 81.3 fL (ref 78.0–100.0)
PLATELETS: 265 10*3/uL (ref 150–400)
RBC: 4.91 MIL/uL (ref 3.87–5.11)
RDW: 14.3 % (ref 11.5–15.5)
WBC: 12.8 10*3/uL — ABNORMAL HIGH (ref 4.0–10.5)

## 2015-12-09 LAB — POC URINE PREG, ED: Preg Test, Ur: NEGATIVE

## 2015-12-09 LAB — URINALYSIS, ROUTINE W REFLEX MICROSCOPIC
BILIRUBIN URINE: NEGATIVE
GLUCOSE, UA: NEGATIVE mg/dL
HGB URINE DIPSTICK: NEGATIVE
Ketones, ur: 15 mg/dL — AB
Leukocytes, UA: NEGATIVE
Nitrite: NEGATIVE
PROTEIN: NEGATIVE mg/dL
Specific Gravity, Urine: 1.03 (ref 1.005–1.030)
pH: 5 (ref 5.0–8.0)

## 2015-12-09 LAB — LIPASE, BLOOD: LIPASE: 30 U/L (ref 11–51)

## 2015-12-09 MED ORDER — ONDANSETRON HCL 4 MG/2ML IJ SOLN
4.0000 mg | Freq: Once | INTRAMUSCULAR | Status: AC
  Administered 2015-12-09: 4 mg via INTRAVENOUS
  Filled 2015-12-09: qty 2

## 2015-12-09 MED ORDER — HYDROCODONE-ACETAMINOPHEN 5-325 MG PO TABS: 1.0000 | ORAL_TABLET | ORAL | Status: DC | PRN

## 2015-12-09 MED ORDER — SODIUM CHLORIDE 0.9 % IV BOLUS (SEPSIS)
1000.0000 mL | Freq: Once | INTRAVENOUS | Status: AC
  Administered 2015-12-09: 1000 mL via INTRAVENOUS

## 2015-12-09 MED ORDER — GI COCKTAIL ~~LOC~~
30.0000 mL | Freq: Once | ORAL | Status: AC
  Administered 2015-12-09: 30 mL via ORAL
  Filled 2015-12-09: qty 30

## 2015-12-09 MED ORDER — PANTOPRAZOLE SODIUM 40 MG PO TBEC: 40.0000 mg | DELAYED_RELEASE_TABLET | Freq: Every day | ORAL | Status: DC

## 2015-12-09 MED ORDER — HYDROMORPHONE HCL 1 MG/ML IJ SOLN
1.0000 mg | Freq: Once | INTRAMUSCULAR | Status: AC
  Administered 2015-12-09: 1 mg via INTRAVENOUS
  Filled 2015-12-09: qty 1

## 2015-12-09 MED ORDER — PANTOPRAZOLE SODIUM 40 MG IV SOLR
40.0000 mg | Freq: Once | INTRAVENOUS | Status: AC
  Administered 2015-12-09: 40 mg via INTRAVENOUS
  Filled 2015-12-09: qty 40

## 2015-12-09 MED ORDER — FAMOTIDINE 20 MG PO TABS: 20.0000 mg | ORAL_TABLET | Freq: Two times a day (BID) | ORAL | Status: DC

## 2015-12-09 MED ORDER — ONDANSETRON 4 MG PO TBDP: 4.0000 mg | ORAL_TABLET | Freq: Three times a day (TID) | ORAL | Status: DC | PRN

## 2015-12-09 NOTE — ED Provider Notes (Signed)
CSN: [id number]     Arrival date & time 12/09/15  [id number] History  By signing my name below, I, Arianna Nassar, attest that this documentation has been prepared under the direction and in the presence of Sherwood Gambler, MD. Electronically Signed: Julien Nordmann, ED Scribe. 12/09/2015. 8:06 AM.    Chief Complaint  Patient presents with  . Abdominal Pain      The history is provided by the patient. No language interpreter was used.   HPI Comments: Patricia Stanton is a 30 y.o. female who has a PMHx of obesity, ovarian cyst, endometriosis, and chronic abdominal painn presents to the Emergency Department complaining of constant, gradual worsening, moderate, burning, epigastric abdominal pain 4 days ago. She has been having associated nausea and vomiting that started this morning. Pt says her current pain radiates and "wraps" around to her back. Pt has a hx of cholecystectomy in March 2016 andreports her pain feels similar to the gallbladder pain she had.Pt denies diarrhea, blood in stool, melena, hematemesis, burning with urination, hematuria, fever, or hx of HTN.  Past Medical History  Diagnosis Date  . Anxiety   . Obesity   . Tobacco abuse   . Ovarian cyst   . Endometriosis   . Chronic abdominal pain   . Chronic headaches   . Mood disorder [facility])    Past Surgical History  Procedure Laterality Date  . Femur fracture surgery Left   . Knee arthroscopy Left   . Tubal ligation    . Cesarean section  2008    [place]  . Cholecystectomy N/A 11/25/2014    Procedure: LAPAROSCOPIC CHOLECYSTECTOMY;  Surgeon: Aviva Signs Md, MD;  Location: [facility];  Service: General;  Laterality: N/A;   Family History  Problem Relation Age of Onset  . Hypertension Mother   . Hyperlipidemia Mother   . Fibromyalgia Mother   . Other Mother     degenerative disc disease  . Diabetes Father   . Hypertension Father   . Hyperlipidemia Father   . Heart disease Father   . Heart attack Father   . Stroke Father   .  Hyperlipidemia Brother   . Hypertension Brother    Social History  Substance Use Topics  . Smoking status: Current Every Day Smoker -- 0.50 packs/day for 11 years    Types: Cigarettes  . Smokeless tobacco: Never Used  . Alcohol Use: No   OB History    No data available     Review of Systems  Constitutional: Negative for fever.  Gastrointestinal: Positive for nausea, vomiting and abdominal pain. Negative for diarrhea and blood in stool.  Genitourinary: Negative for dysuria and hematuria.  All other systems reviewed and are negative.     Allergies  Review of patient's allergies indicates no known allergies.  Home Medications   Prior to Admission medications   Medication Sig Start Date End Date Taking? Authorizing Provider  divalproex (DEPAKOTE) 250 MG DR tablet Take 250 mg by mouth 3 (three) times daily.   Yes Historical Provider, MD  LORazepam (ATIVAN) 1 MG tablet Take 1 mg by mouth every 8 (eight) hours.   Yes Historical Provider, MD   Triage vitals: BP 126/76 mmHg  Pulse 94  Temp(Src) 98.2 F (36.8 C) (Oral)  Resp 20  Ht 5\' 5"  (1.651 m)  Wt 250 lb (113.399 kg)  BMI 41.60 kg/m2  SpO2 95%  LMP 11/12/2015 Physical Exam  Constitutional: She is oriented to person, place, and time. She appears well-developed and well-nourished.  obese  HENT:  Head: Normocephalic and atraumatic.  Right Ear: External ear normal.  Left Ear: External ear normal.  Nose: Nose normal.  Eyes: Right eye exhibits no discharge. Left eye exhibits no discharge.  Cardiovascular: Normal rate, regular rhythm and normal heart sounds.   Pulmonary/Chest: Effort normal and breath sounds normal.  Abdominal: Soft. There is no tenderness.  Epigastric tenderness  Neurological: She is alert and oriented to person, place, and time.  Skin: Skin is warm and dry.  Nursing note and vitals reviewed.   ED Course  Procedures  DIAGNOSTIC STUDIES: Oxygen Saturation is 95% on RA, adequate by my  interpretation.  COORDINATION OF CARE:  8:04 AM Discussed treatment plan which includes lab work, nausea medication, IV fluids and GI cocktail with pt at bedside and pt agreed to plan.  Labs Review Labs Reviewed  COMPREHENSIVE METABOLIC PANEL - Abnormal; Notable for the following:    Glucose, Bld 160 (*)    Calcium 8.4 (*)    All other components within normal limits  CBC - Abnormal; Notable for the following:    WBC 12.8 (*)    All other components within normal limits  URINALYSIS, ROUTINE W REFLEX MICROSCOPIC (NOT AT [facility]) - Abnormal; Notable for the following:    APPearance CLOUDY (*)    Ketones, ur 15 (*)    All other components within normal limits  LIPASE, BLOOD  POC URINE PREG, ED    Imaging Review No results found. I have personally reviewed and evaluated these images and lab results as part of my medical decision-making.   EKG Interpretation None      MDM   Final diagnoses:  Upper abdominal pain  Nausea and vomiting in adult    The patient's pain is likely gastric in etiology. Low suspicion for obstruction. After one dose of medicine she is now much better. Patient is feeling well enough to go home. Low suspicion for obstruction, pancreatitis, ruptured or bleeding ulcer, or other acute intra-abdominal emergency. Reports no blood in her stool. Will treat with PPI, H2 blockers, and pain/nausea control. F/u with PCP. No imaging at this time. No lower abdominal tenderness.  I personally performed the services described in this documentation, which was scribed in my presence. The recorded information has been reviewed and is accurate.    Sherwood Gambler, MD 12/09/15 1539

## 2015-12-09 NOTE — ED Notes (Signed)
Pt tolerating po fluids well  

## 2015-12-09 NOTE — ED Notes (Signed)
Pt's family at nursing station yelling and threatening if the patient is not seen by a doctor immediately.  Pt in no distress.  Sitting on bed complaining about wait time.  No active vomiting noted, although pt's family reports she "is puking her guts up."

## 2015-12-09 NOTE — ED Notes (Signed)
Pt has removed all tape from IV site and partially dislodged.  Infusing well.  :Pt denies pain with infusion

## 2015-12-09 NOTE — ED Notes (Signed)
Pt reports upper abdominal pain x 3 days. Pt states she had this same pain before she had her gallbladder removed last year. Pt reports n/v since last night that gets worse after she eats.

## 2015-12-12 ENCOUNTER — Emergency Department (HOSPITAL_COMMUNITY)
Admission: EM | Admit: 2015-12-12 | Discharge: 2015-12-12 | Disposition: A | Discharge status: Home / Self Care | Payer: Medicaid Other | Attending: Emergency Medicine | Admitting: Emergency Medicine

## 2015-12-12 ENCOUNTER — Encounter (HOSPITAL_COMMUNITY): Payer: Self-pay | Admitting: Emergency Medicine

## 2015-12-12 DIAGNOSIS — E669 Obesity, unspecified: Secondary | ICD-10-CM | POA: Diagnosis not present

## 2015-12-12 DIAGNOSIS — R112 Nausea with vomiting, unspecified: Secondary | ICD-10-CM | POA: Diagnosis not present

## 2015-12-12 DIAGNOSIS — F17210 Nicotine dependence, cigarettes, uncomplicated: Secondary | ICD-10-CM | POA: Insufficient documentation

## 2015-12-12 DIAGNOSIS — Z9049 Acquired absence of other specified parts of digestive tract: Secondary | ICD-10-CM | POA: Insufficient documentation

## 2015-12-12 DIAGNOSIS — Z79899 Other long term (current) drug therapy: Secondary | ICD-10-CM | POA: Insufficient documentation

## 2015-12-12 DIAGNOSIS — R05 Cough: Secondary | ICD-10-CM | POA: Diagnosis not present

## 2015-12-12 DIAGNOSIS — R1013 Epigastric pain: Secondary | ICD-10-CM | POA: Insufficient documentation

## 2015-12-12 LAB — URINALYSIS, ROUTINE W REFLEX MICROSCOPIC
BILIRUBIN URINE: NEGATIVE
GLUCOSE, UA: NEGATIVE mg/dL
KETONES UR: NEGATIVE mg/dL
Leukocytes, UA: NEGATIVE
Nitrite: NEGATIVE
PROTEIN: NEGATIVE mg/dL
Specific Gravity, Urine: 1.02 (ref 1.005–1.030)
pH: 6.5 (ref 5.0–8.0)

## 2015-12-12 LAB — URINE MICROSCOPIC-ADD ON
Bacteria, UA: NONE SEEN
WBC, UA: NONE SEEN WBC/hpf (ref 0–5)

## 2015-12-12 MED ORDER — GI COCKTAIL ~~LOC~~
30.0000 mL | Freq: Once | ORAL | Status: AC
  Administered 2015-12-12: 30 mL via ORAL
  Filled 2015-12-12: qty 30

## 2015-12-12 NOTE — ED Notes (Signed)
Pain started on March 22 nd and pt was seen here in [place] on Saturday.  Discharged with Zofran, hydrocodone, Pepcid and Protonix and pain has increased.  Pain is intermittent shooting pain from front upper abdomen round to right and left back. Rates pain 10/10.  C/o cough and congestion, yellow thick secretions.

## 2015-12-12 NOTE — Discharge Instructions (Signed)
Abdominal Pain, Adult Many things can cause belly (abdominal) pain. Most times, the belly pain is not dangerous. Many cases of belly pain can be watched and treated at home. HOME CARE   Do not take medicines that help you go poop (laxatives) unless told to by your doctor.  Only take medicine as told by your doctor.  Eat or drink as told by your doctor. Your doctor will tell you if you should be on a special diet. GET HELP IF:  You do not know what is causing your belly pain.  You have belly pain while you are sick to your stomach (nauseous) or have runny poop (diarrhea).  You have pain while you pee or poop.  Your belly pain wakes you up at night.  You have belly pain that gets worse or better when you eat.  You have belly pain that gets worse when you eat fatty foods.  You have a fever. GET HELP RIGHT AWAY IF:     You keep throwing up (vomiting).  The pain changes and is only in the right or left part of the belly.  You have bloody or tarry looking poop. MAKE SURE YOU:   Understand these instructions.  Will watch your condition.  Will get help right away if you are not doing well or get worse.   This information is not intended to replace advice given to you by your health care provider. Make sure you discuss any questions you have with your health care provider.   Document Released: 02/19/2008 Document Revised: 09/23/2014 Document Reviewed: 05/12/2013 Elsevier Interactive Patient Education [employer].

## 2015-12-12 NOTE — ED Provider Notes (Signed)
CSN: [id number]     Arrival date & time 12/12/15  0818 History  By signing my name below, I, Stephania Fragmin, attest that this documentation has been prepared under the direction and in the presence of Ripley Fraise, MD. Electronically Signed: Stephania Fragmin, ED Scribe. 12/12/2015. 8:57 AM.   Chief Complaint  Patient presents with  . Abdominal Pain   Patient is a 30 y.o. female presenting with abdominal pain. The history is provided by the patient. No language interpreter was used.  Abdominal Pain Pain location:  Epigastric Pain quality: shooting   Pain radiates to:  R flank and L flank Pain severity:  Severe Onset quality:  Gradual Duration:  6 days Timing:  Intermittent Progression:  Worsening Chronicity:  New Context: not alcohol use   Relieved by:  Nothing Worsened by:  Nothing tried Ineffective treatments:  None tried Associated symptoms: cough, nausea and vomiting   Associated symptoms: no chest pain, no dysuria, no fever, no hematemesis, no hematochezia, no hematuria, no melena, no vaginal bleeding and no vaginal discharge     HPI Comments: Patricia Stanton is a 30 y.o. female with a history obesity, anxiety, ovarian cyst, and cholecystectomy performed by Dr. Arnoldo Morale, who presents to the Emergency Department complaining of intermittent, gradually worsening abdominal pain radiating to her bilateral flanks that began 6 days ago. She complains of associated nausea and vomiting that began 4 days ago, as well as a cough productive with yellow sputum. Patient was seen at the ED here 3 days ago for the same and discharged with Pepcid and Protonix but states her pain has only been worsening since. Nothing makes her symptoms better or worse. She states she has been eating liquid foods, as solid foods make her vomit; she last vomited about 4 days ago. Patient notes a history of similar symptoms around the time she had a cholecystectomy. She denies frequent NSAID use. She denies a history of hernias,  pancreas conditions, cardiac conditions, or EtOH abuse. She denies fever, chest pain, vaginal bleeding, vaginal discharge, hematemesis, hematochezia, melena, or urinary symptoms. Patient states she has never seen a GI specialist before.   Past Medical History  Diagnosis Date  . Anxiety   . Obesity   . Tobacco abuse   . Ovarian cyst   . Endometriosis   . Chronic abdominal pain   . Chronic headaches   . Mood disorder [facility] [place])    Past Surgical History  Procedure Laterality Date  . Femur fracture surgery Left   . Knee arthroscopy Left   . Tubal ligation    . Cesarean section  2008    [place]  . Cholecystectomy N/A 11/25/2014    Procedure: LAPAROSCOPIC CHOLECYSTECTOMY;  Surgeon: Aviva Signs Md, MD;  Location: [facility];  Service: General;  Laterality: N/A;   Family History  Problem Relation Age of Onset  . Hypertension Mother   . Hyperlipidemia Mother   . Fibromyalgia Mother   . Other Mother     degenerative disc disease  . Diabetes Father   . Hypertension Father   . Hyperlipidemia Father   . Heart disease Father   . Heart attack Father   . Stroke Father   . Hyperlipidemia Brother   . Hypertension Brother    Social History  Substance Use Topics  . Smoking status: Current Every Day Smoker -- 0.50 packs/day for 11 years    Types: Cigarettes  . Smokeless tobacco: Never Used  . Alcohol Use: No   OB History    No  data available     Review of Systems  Constitutional: Negative for fever.  Respiratory: Positive for cough.   Cardiovascular: Negative for chest pain.  Gastrointestinal: Positive for nausea, vomiting and abdominal pain. Negative for melena, hematochezia and hematemesis.  Genitourinary: Negative for dysuria, hematuria, vaginal bleeding and vaginal discharge.  All other systems reviewed and are negative.     Allergies  Review of patient's allergies indicates no known allergies.  Home Medications   Prior to Admission medications   Medication Sig Start Date End  Date Taking? Authorizing Provider  divalproex (DEPAKOTE) 250 MG DR tablet Take 250 mg by mouth 3 (three) times daily.    Historical Provider, MD  famotidine (PEPCID) 20 MG tablet Take 1 tablet (20 mg total) by mouth 2 (two) times daily. 12/09/15   Sherwood Gambler, MD  HYDROcodone-acetaminophen (NORCO) 5-325 MG tablet Take 1 tablet by mouth every 4 (four) hours as needed. 12/09/15   Sherwood Gambler, MD  LORazepam (ATIVAN) 1 MG tablet Take 1 mg by mouth every 8 (eight) hours.    Historical Provider, MD  ondansetron (ZOFRAN ODT) 4 MG disintegrating tablet Take 1 tablet (4 mg total) by mouth every 8 (eight) hours as needed for nausea or vomiting. 12/09/15   Sherwood Gambler, MD  pantoprazole (PROTONIX) 40 MG tablet Take 1 tablet (40 mg total) by mouth daily. 12/09/15   Sherwood Gambler, MD   LMP 11/12/2015 Physical Exam  Nursing note and vitals reviewed. CONSTITUTIONAL: Well developed/well nourished HEAD: Normocephalic/atraumatic EYES: EOMI/PERRL, no icterus ENMT: Mucous membranes moist NECK: supple no meningeal signs SPINE/BACK:entire spine nontender CV: S1/S2 noted, no murmurs/rubs/gallops noted LUNGS: Lungs are clear to auscultation bilaterally, no apparent distress ABDOMEN: soft, mild epigastric tenderness, no rebound or guarding, bowel sounds noted throughout abdomen GU:no cva tenderness NEURO: Pt is awake/alert/appropriate, moves all extremitiesx4.  No facial droop.   EXTREMITIES: pulses normal/equal, full ROM SKIN: warm, color normal PSYCH: no abnormalities of mood noted, alert and oriented to situation   ED Course  Procedures   DIAGNOSTIC STUDIES: Oxygen Saturation is 100% on RA, normal by my interpretation.    COORDINATION OF CARE: 8:48 AM - Discussed treatment plan with pt at bedside which includes UA and EKG, and GI cocktail. Will also give referral to GI specialist. Pt verbalized understanding and agreed to plan.   Labs Review Labs Reviewed  URINALYSIS, ROUTINE W REFLEX MICROSCOPIC  (NOT AT [facility]) - Abnormal; Notable for the following:    Hgb urine dipstick SMALL (*)    All other components within normal limits  URINE MICROSCOPIC-ADD ON - Abnormal; Notable for the following:    Squamous Epithelial / LPF 0-5 (*)    All other components within normal limits   I have personally reviewed and evaluated these lab results as part of my medical decision-making.   Medications  gi cocktail (Maalox,Lidocaine,Donnatal) (30 mLs Oral Given 12/12/15 0904)   Pt improved She is in no distress I don't feel emergent imaging required Discussed dietary changes Avoid NSAIDs Advised to continue meds that were started on last visit GI f/u as outpatient   MDM   Final diagnoses:  Epigastric pain    Nursing notes including past medical history and social history reviewed and considered in documentation Labs/vital reviewed myself and considered during evaluation   I personally performed the services described in this documentation, which was scribed in my presence. The recorded information has been reviewed and is accurate.        Ripley Fraise, MD 12/12/15 [phone]

## 2015-12-13 ENCOUNTER — Encounter: Payer: Self-pay | Admitting: Gastroenterology

## 2015-12-19 ENCOUNTER — Encounter: Payer: Self-pay | Admitting: Gastroenterology

## 2015-12-19 ENCOUNTER — Ambulatory Visit (INDEPENDENT_AMBULATORY_CARE_PROVIDER_SITE_OTHER): Payer: Medicaid Other | Admitting: Gastroenterology

## 2015-12-19 ENCOUNTER — Other Ambulatory Visit: Payer: Self-pay

## 2015-12-19 VITALS — BP 139/88 | HR 104 | Temp 97.6°F | Ht 66.0 in | Wt 329.2 lb

## 2015-12-19 DIAGNOSIS — K5900 Constipation, unspecified: Secondary | ICD-10-CM | POA: Insufficient documentation

## 2015-12-19 DIAGNOSIS — R112 Nausea with vomiting, unspecified: Secondary | ICD-10-CM

## 2015-12-19 DIAGNOSIS — R1110 Vomiting, unspecified: Secondary | ICD-10-CM | POA: Insufficient documentation

## 2015-12-19 DIAGNOSIS — R1013 Epigastric pain: Secondary | ICD-10-CM | POA: Diagnosis not present

## 2015-12-19 DIAGNOSIS — R1010 Upper abdominal pain, unspecified: Secondary | ICD-10-CM | POA: Insufficient documentation

## 2015-12-19 MED ORDER — LINACLOTIDE 290 MCG PO CAPS: 290.0000 ug | ORAL_CAPSULE | Freq: Every day | ORAL | Status: DC

## 2015-12-19 NOTE — Progress Notes (Signed)
CC'ED TO PCP 

## 2015-12-19 NOTE — Assessment & Plan Note (Signed)
Chronic constipation. Recommend increasing daily fiber intake and water consumption. Start Linzess 290 g daily on empty stomach.

## 2015-12-19 NOTE — Assessment & Plan Note (Signed)
30 year old female with one-week history of epigastric pain associated with vomiting. Temporary relief with GI cocktail. Rare aspirin powder use. Recommend upper endoscopy for further evaluation. If EGD is unremarkable, she may ultimately require CT imaging.  I have discussed the risks, alternatives, benefits with regards to but not limited to the risk of reaction to medication, bleeding, infection, perforation and the patient is agreeable to proceed. Written consent to be obtained.   Phenergan 25 mg IV 30 minutes before the procedure to augment conscious sedation given when necessary Ativan use.  Two-week course of Dexilant once daily before breakast. Samples provided. Hold pantoprazole for now. Since Dexilant is not on Medicaid plan we will have to go back to another PPI in the near future.

## 2015-12-19 NOTE — Progress Notes (Signed)
Primary Care Physician:  [facility], Carlynn Spry, NP  Primary Gastroenterologist:  Garfield Cornea, MD   Chief Complaint  Patient presents with  . Abdominal Pain    HPI:  Patricia Stanton is a 30 y.o. female here for abdominal pain. Recently seen in ED on two occasions. We originally received a referral from Dr. Kevan Ny with [facility] for gastric bypass surgery. We suggested referral to gastric bypass surgeon instead.  She presents today for abdominal pain. She was advised to follow up with GI per ER physician. She complains of epigastric pain which started last Wednesday. Associated with N/V. Coliky pain. Reminded her of gallbladder pain. She had her gb removed last year for symptomatic cholelithiasis. Within 15 minutes of eating certain foods will have N/V. Now only eating bland stuff. Staying away of spicy/greasy foods, hot stuff. Prior dietary changes because certain foods would cause gross hematuria, has discussed with PCP. Since Wednesday, some melena, no Pepto. Occasionally has bright red blood per rectum when she has to pass a hard stool. States she has hemorrhoids.  Emergency department visit 2 in the past week. Labs on March 25 unremarkable except for white blood cell count of 12.8. No imaging studies. She states her symptoms completely resolved temporarily after taking a GI cocktail. Pepcid and pantoprazole has not helped. Zofran holds off vomiting for a period of time but she still feels nauseated. Last vomiting couple of days ago.  Stays constipated. A lot of bloating. Bowel movement twice per week. Hard stools. Does not take anything over-the-counter.  Occasional aspirin powder about once per month. No other NSAIDs.   Current Outpatient Prescriptions  Medication Sig Dispense Refill  . divalproex (DEPAKOTE) 250 MG DR tablet Take 250 mg by mouth 3 (three) times daily. Reported on 12/19/2015    . famotidine (PEPCID) 20 MG tablet Take 1  tablet (20 mg total) by mouth 2 (two) times daily. 10 tablet 0  . HYDROcodone-acetaminophen (NORCO) 5-325 MG tablet Take 1 tablet by mouth every 4 (four) hours as needed. 10 tablet 0  . LORazepam (ATIVAN) 1 MG tablet Take 1 mg by mouth every 8 (eight) hours. Reported on 12/19/2015    . ondansetron (ZOFRAN ODT) 4 MG disintegrating tablet Take 1 tablet (4 mg total) by mouth every 8 (eight) hours as needed for nausea or vomiting. 10 tablet 0  . pantoprazole (PROTONIX) 40 MG tablet Take 1 tablet (40 mg total) by mouth daily. 30 tablet 0   No current facility-administered medications for this visit.    Allergies as of 12/19/2015  . (No Known Allergies)    Past Medical History  Diagnosis Date  . Anxiety   . Obesity   . Tobacco abuse   . Ovarian cyst   . Endometriosis   . Chronic abdominal pain   . Chronic headaches   . Mood disorder [facility])     Past Surgical History  Procedure Laterality Date  . Femur fracture surgery Left 1995    tree fell on her  . Knee arthroscopy Left   . Tubal ligation    . Cesarean section  2008    [place]  . Cholecystectomy N/A 11/25/2014    Procedure: LAPAROSCOPIC CHOLECYSTECTOMY;  Surgeon: Aviva Signs Md, MD;  Location: [facility];  Service: General;  Laterality: N/A;    Family History  Problem Relation Age of Onset  . Hypertension Mother   . Hyperlipidemia Mother   . Fibromyalgia Mother   . Other Mother  degenerative disc disease  . Diabetes Father   . Hypertension Father   . Hyperlipidemia Father   . Heart disease Father   . Heart attack Father   . Stroke Father   . Hyperlipidemia Brother   . Hypertension Brother   . Colon cancer Neg Hx   . Inflammatory bowel disease Neg Hx   . Aneurysm Maternal Grandmother     AAA  . Aneurysm Paternal Grandmother     AAA    Social History   Social History  . Marital Status: Divorced    Spouse Name: N/A  . Number of Children: 1  . Years of Education: N/A   Occupational History  . Not on file.    Social History Main Topics  . Smoking status: Current Every Day Smoker -- 0.50 packs/day for 11 years    Types: Cigarettes  . Smokeless tobacco: Never Used  . Alcohol Use: No  . Drug Use: No  . Sexual Activity: Yes    Birth Control/ Protection: Surgical   Other Topics Concern  . Not on file   Social History Narrative      ROS:  General: Negative for anorexia, weight loss, fever, chills, fatigue, weakness. Eyes: Negative for vision changes.  ENT: Negative for hoarseness, difficulty swallowing , nasal congestion. CV: Negative for chest pain, angina, palpitations, dyspnea on exertion, peripheral edema.  Respiratory: Negative for dyspnea at rest, dyspnea on exertion, cough, sputum, wheezing.  GI: See history of present illness. GU:  Negative for dysuria, , urinary incontinence, urinary frequency, nocturnal urination. See history of present illness MS: Negative for joint pain, low back pain.  Derm: Negative for rash or itching.  Neuro: Negative for weakness, abnormal sensation, seizure, frequent headaches, memory loss, confusion.  Psych: Negative for anxiety, depression, suicidal ideation, hallucinations.  Endo: Negative for unusual weight change.  Heme: Negative for bruising or bleeding. Allergy: Negative for rash or hives.    Physical Examination:  BP 139/88 mmHg  Pulse 104  Temp(Src) 97.6 F (36.4 C) (Oral)  Ht 5\' 6"  (1.676 m)  Wt 329 lb 3.2 oz (149.324 kg)  BMI 53.16 kg/m2  LMP 11/12/2015   General: Well-nourished, well-developed in no acute distress.  Head: Normocephalic, atraumatic.   Eyes: Conjunctiva pink, no icterus. Mouth: Oropharyngeal mucosa moist and pink , no lesions erythema or exudate. Neck: Supple without thyromegaly, masses, or lymphadenopathy.  Lungs: Clear to auscultation bilaterally.  Heart: Regular rate and rhythm, no murmurs rubs or gallops.  Abdomen: Bowel sounds are normal, moderate epigastric tenderness, nondistended, no hepatosplenomegaly  or masses, no abdominal bruits or    hernia , no rebound or guarding.   Rectal: Not performed Extremities: No lower extremity edema. No clubbing or deformities.  Neuro: Alert and oriented x 4 , grossly normal neurologically.  Skin: Warm and dry, no rash or jaundice.   Psych: Alert and cooperative, normal mood and affect.  Labs: Lab Results  Component Value Date   CREATININE 0.56 12/09/2015   BUN 9 12/09/2015   NA 139 12/09/2015   K 4.1 12/09/2015   CL 107 12/09/2015   CO2 22 12/09/2015   Lab Results  Component Value Date   WBC 12.8* 12/09/2015   HGB 13.6 12/09/2015   HCT 39.9 12/09/2015   MCV 81.3 12/09/2015   PLT 265 12/09/2015   Lab Results  Component Value Date   ALT 28 12/09/2015   AST 29 12/09/2015   ALKPHOS 69 12/09/2015   BILITOT 0.5 12/09/2015     Imaging  Studies: No results found.

## 2015-12-19 NOTE — Patient Instructions (Signed)
1. Upper endoscopy with Dr. Gala Romney on Thursday. See separate instructions.  2. Stop pantoprazole. Start Dexilant once daily before breakfast. Samples provided. This one is not likely covered by Medicaid but I want to get you feeling better quickly. We will likely have to go back to another medication if a couple of weeks.  3. Start Linzess once daily before breakfast for constipation. RX sent to pharmacy. If this dose seems to strong, we can decrease it. Just let me know.

## 2015-12-21 ENCOUNTER — Other Ambulatory Visit: Payer: Self-pay

## 2015-12-21 ENCOUNTER — Encounter (HOSPITAL_COMMUNITY): Payer: Self-pay

## 2015-12-21 ENCOUNTER — Encounter (HOSPITAL_COMMUNITY)
Admission: RE | Disposition: A | Discharge status: Home / Self Care | Payer: Self-pay | Source: Ambulatory Visit | Attending: Internal Medicine

## 2015-12-21 ENCOUNTER — Ambulatory Visit (HOSPITAL_COMMUNITY)
Admission: RE | Admit: 2015-12-21 | Discharge: 2015-12-21 | Disposition: A | Discharge status: Home / Self Care | Payer: Medicaid Other | Source: Ambulatory Visit | Attending: Internal Medicine | Admitting: Internal Medicine

## 2015-12-21 DIAGNOSIS — K921 Melena: Secondary | ICD-10-CM | POA: Diagnosis not present

## 2015-12-21 DIAGNOSIS — K319 Disease of stomach and duodenum, unspecified: Secondary | ICD-10-CM | POA: Diagnosis not present

## 2015-12-21 DIAGNOSIS — K5900 Constipation, unspecified: Secondary | ICD-10-CM | POA: Insufficient documentation

## 2015-12-21 DIAGNOSIS — F419 Anxiety disorder, unspecified: Secondary | ICD-10-CM | POA: Diagnosis not present

## 2015-12-21 DIAGNOSIS — K3189 Other diseases of stomach and duodenum: Secondary | ICD-10-CM

## 2015-12-21 DIAGNOSIS — K2960 Other gastritis without bleeding: Secondary | ICD-10-CM | POA: Diagnosis not present

## 2015-12-21 DIAGNOSIS — E669 Obesity, unspecified: Secondary | ICD-10-CM | POA: Insufficient documentation

## 2015-12-21 DIAGNOSIS — Z9049 Acquired absence of other specified parts of digestive tract: Secondary | ICD-10-CM | POA: Insufficient documentation

## 2015-12-21 DIAGNOSIS — R1084 Generalized abdominal pain: Secondary | ICD-10-CM

## 2015-12-21 DIAGNOSIS — R1013 Epigastric pain: Secondary | ICD-10-CM

## 2015-12-21 DIAGNOSIS — F17210 Nicotine dependence, cigarettes, uncomplicated: Secondary | ICD-10-CM | POA: Diagnosis not present

## 2015-12-21 DIAGNOSIS — Z6841 Body Mass Index (BMI) 40.0 and over, adult: Secondary | ICD-10-CM | POA: Insufficient documentation

## 2015-12-21 DIAGNOSIS — Z79899 Other long term (current) drug therapy: Secondary | ICD-10-CM | POA: Diagnosis not present

## 2015-12-21 DIAGNOSIS — R112 Nausea with vomiting, unspecified: Secondary | ICD-10-CM

## 2015-12-21 HISTORY — PX: ESOPHAGOGASTRODUODENOSCOPY: SHX5428

## 2015-12-21 SURGERY — EGD (ESOPHAGOGASTRODUODENOSCOPY)
Anesthesia: Moderate Sedation

## 2015-12-21 MED ORDER — SODIUM CHLORIDE 0.9 % IV SOLN
INTRAVENOUS | Status: DC
  Administered 2015-12-21: 08:00:00 via INTRAVENOUS

## 2015-12-21 MED ORDER — MEPERIDINE HCL 100 MG/ML IJ SOLN
INTRAMUSCULAR | Status: DC | PRN
  Administered 2015-12-21: 25 mg via INTRAVENOUS
  Administered 2015-12-21: 50 mg via INTRAVENOUS

## 2015-12-21 MED ORDER — ONDANSETRON HCL 4 MG/2ML IJ SOLN
INTRAMUSCULAR | Status: AC
  Filled 2015-12-21: qty 2

## 2015-12-21 MED ORDER — LIDOCAINE VISCOUS 2 % MT SOLN
OROMUCOSAL | Status: DC | PRN
  Administered 2015-12-21: 3 mL via OROMUCOSAL

## 2015-12-21 MED ORDER — LIDOCAINE VISCOUS 2 % MT SOLN
OROMUCOSAL | Status: AC
  Filled 2015-12-21: qty 15

## 2015-12-21 MED ORDER — STERILE WATER FOR IRRIGATION IR SOLN
Status: DC | PRN
  Administered 2015-12-21: 09:00:00

## 2015-12-21 MED ORDER — MEPERIDINE HCL 100 MG/ML IJ SOLN
INTRAMUSCULAR | Status: DC
Start: 2015-12-21 — End: 2015-12-21
  Filled 2015-12-21: qty 2

## 2015-12-21 MED ORDER — PROMETHAZINE HCL 25 MG/ML IJ SOLN
25.0000 mg | Freq: Once | INTRAMUSCULAR | Status: AC
  Administered 2015-12-21: 25 mg via INTRAVENOUS

## 2015-12-21 MED ORDER — MIDAZOLAM HCL 5 MG/5ML IJ SOLN
INTRAMUSCULAR | Status: DC | PRN
  Administered 2015-12-21: 1 mg via INTRAVENOUS
  Administered 2015-12-21: 2 mg via INTRAVENOUS
  Administered 2015-12-21: 1 mg via INTRAVENOUS

## 2015-12-21 MED ORDER — ONDANSETRON HCL 4 MG/2ML IJ SOLN
INTRAMUSCULAR | Status: DC | PRN
  Administered 2015-12-21: 4 mg via INTRAVENOUS

## 2015-12-21 MED ORDER — SODIUM CHLORIDE 0.9% FLUSH
INTRAVENOUS | Status: AC
  Filled 2015-12-21: qty 10

## 2015-12-21 MED ORDER — PROMETHAZINE HCL 25 MG/ML IJ SOLN
INTRAMUSCULAR | Status: AC
  Filled 2015-12-21: qty 1

## 2015-12-21 MED ORDER — MIDAZOLAM HCL 5 MG/5ML IJ SOLN
INTRAMUSCULAR | Status: AC
  Filled 2015-12-21: qty 10

## 2015-12-21 NOTE — Interval H&P Note (Signed)
History and Physical Interval Note:  12/21/2015 9:09 AM  Patricia Stanton  has presented today for surgery, with the diagnosis of epigastric pain, nausea, vomiting  The various methods of treatment have been discussed with the patient and family. After consideration of risks, benefits and other options for treatment, the patient has consented to  Procedure(s) with comments: ESOPHAGOGASTRODUODENOSCOPY (EGD) (N/A) - 0830 as a surgical intervention .  The patient's history has been reviewed, patient examined, no change in status, stable for surgery.  I have reviewed the patient's chart and labs.  Questions were answered to the patient's satisfaction.     Patricia Stanton  No change; EGD per plan.  The risks, benefits, limitations, alternatives and imponderables have been reviewed with the patient. Potential for esophageal dilation, biopsy, etc. have also been reviewed.  Questions have been answered. All parties agreeable.

## 2015-12-21 NOTE — Op Note (Signed)
[facility] Patient Name: Patricia Stanton Procedure Date: 12/21/2015 9:08 AM MRN: [id number] Date of Birth: October 04, 1985 Attending MD: Norvel Richards , MD CSN: [id number] Age: 30 Admit Type: Outpatient Procedure:                Upper GI endoscopy Indications:              Upper abdominal pain Providers:                Norvel Richards, MD, Gwenlyn Fudge, RN, Georgeann Oppenheim, Technician Referring MD:              Medicines:                Midazolam 4 mg IV, Meperidine 100 mg IV,                            Ondansetron 4 mg IV Complications:            No immediate complications. Estimated Blood Loss:     Estimated blood loss was minimal. Procedure:                Pre-Anesthesia Assessment:                           - Prior to the procedure, a History and Physical                            was performed, and patient medications and                            allergies were reviewed. The patient's tolerance of                            previous anesthesia was also reviewed. The risks                            and benefits of the procedure and the sedation                            options and risks were discussed with the patient.                            All questions were answered, and informed consent                            was obtained. Prior Anticoagulants: The patient has                            taken no previous anticoagulant or antiplatelet                            agents. ASA Grade Assessment: II - A patient with  mild systemic disease. After reviewing the risks                            and benefits, the patient was deemed in                            satisfactory condition to undergo the procedure.                           After obtaining informed consent, the endoscope was                            passed under direct vision. Throughout the                            procedure, the patient's blood  pressure, pulse, and                            oxygen saturations were monitored continuously. The                            Endoscope was introduced through the mouth, and                            advanced to the second part of duodenum. The upper                            GI endoscopy was accomplished without difficulty.                            The patient tolerated the procedure well. Scope In: 9:22:29 AM Scope Out: 9:27:36 AM Total Procedure Duration: 0 hours 5 minutes 7 seconds  Findings:      The examined esophagus was normal.      Multiple localized, 5 mm non-bleeding erosions were found in the gastric       body. There were no stigmata of recent bleeding. This was biopsied with       a cold forceps for histology. Estimated blood loss was minimal.      The second portion of the duodenum was normal. Impression:               - Normal esophagus.                           - Non-bleeding erosive gastropathy. Biopsied.                           - Normal second portion of the duodenum. Moderate Sedation:      Moderate (conscious) sedation was administered by the endoscopy nurse       and supervised by the endoscopist. The following parameters were       monitored: oxygen saturation, heart rate, blood pressure, respiratory       rate, EKG, adequacy of pulmonary ventilation, and response to care.       Total physician intraservice time was 17 minutes. Recommendation:           -  Patient has a contact number available for                            emergencies. The signs and symptoms of potential                            delayed complications were discussed with the                            patient. Return to normal activities tomorrow.                            Written discharge instructions were provided to the                            patient.                           - Advance diet as tolerated.                           - Continue present medications.                            - Await pathology results. Procedure Code(s):        --- Professional ---                           [phone], Esophagogastroduodenoscopy, flexible,                            transoral; with biopsy, single or multiple                           99152, Moderate sedation services provided by the                            same physician or other qualified health care                            professional performing the diagnostic or                            therapeutic service that the sedation supports,                            requiring the presence of an independent trained                            observer to assist in the monitoring of the                            patient's level of consciousness and physiological                            status; initial 15 minutes  of intraservice time,                            patient age 53 years or older Diagnosis Code(s):        --- Professional ---                           K31.89, Other diseases of stomach and duodenum                           R10.10, Upper abdominal pain, unspecified CPT copyright 2016 [employer]. All rights reserved. The codes documented in this report are preliminary and upon coder review may  be revised to meet current compliance requirements. Cristopher Estimable. Rourk, MD Norvel Richards, MD 12/21/2015 9:41:35 AM This report has been signed electronically. Number of Addenda: 0

## 2015-12-21 NOTE — H&P (View-Only) (Signed)
Primary Care Physician:  [facility], Carlynn Spry, NP  Primary Gastroenterologist:  Garfield Cornea, MD   Chief Complaint  Patient presents with  . Abdominal Pain    HPI:  Patricia Stanton is a 30 y.o. female here for abdominal pain. Recently seen in ED on two occasions. We originally received a referral from Dr. Kevan Ny with [facility] for gastric bypass surgery. We suggested referral to gastric bypass surgeon instead.  She presents today for abdominal pain. She was advised to follow up with GI per ER physician. She complains of epigastric pain which started last Wednesday. Associated with N/V. Coliky pain. Reminded her of gallbladder pain. She had her gb removed last year for symptomatic cholelithiasis. Within 15 minutes of eating certain foods will have N/V. Now only eating bland stuff. Staying away of spicy/greasy foods, hot stuff. Prior dietary changes because certain foods would cause gross hematuria, has discussed with PCP. Since Wednesday, some melena, no Pepto. Occasionally has bright red blood per rectum when she has to pass a hard stool. States she has hemorrhoids.  Emergency department visit 2 in the past week. Labs on March 25 unremarkable except for white blood cell count of 12.8. No imaging studies. She states her symptoms completely resolved temporarily after taking a GI cocktail. Pepcid and pantoprazole has not helped. Zofran holds off vomiting for a period of time but she still feels nauseated. Last vomiting couple of days ago.  Stays constipated. A lot of bloating. Bowel movement twice per week. Hard stools. Does not take anything over-the-counter.  Occasional aspirin powder about once per month. No other NSAIDs.   Current Outpatient Prescriptions  Medication Sig Dispense Refill  . divalproex (DEPAKOTE) 250 MG DR tablet Take 250 mg by mouth 3 (three) times daily. Reported on 12/19/2015    . famotidine (PEPCID) 20 MG tablet Take 1  tablet (20 mg total) by mouth 2 (two) times daily. 10 tablet 0  . HYDROcodone-acetaminophen (NORCO) 5-325 MG tablet Take 1 tablet by mouth every 4 (four) hours as needed. 10 tablet 0  . LORazepam (ATIVAN) 1 MG tablet Take 1 mg by mouth every 8 (eight) hours. Reported on 12/19/2015    . ondansetron (ZOFRAN ODT) 4 MG disintegrating tablet Take 1 tablet (4 mg total) by mouth every 8 (eight) hours as needed for nausea or vomiting. 10 tablet 0  . pantoprazole (PROTONIX) 40 MG tablet Take 1 tablet (40 mg total) by mouth daily. 30 tablet 0   No current facility-administered medications for this visit.    Allergies as of 12/19/2015  . (No Known Allergies)    Past Medical History  Diagnosis Date  . Anxiety   . Obesity   . Tobacco abuse   . Ovarian cyst   . Endometriosis   . Chronic abdominal pain   . Chronic headaches   . Mood disorder [facility])     Past Surgical History  Procedure Laterality Date  . Femur fracture surgery Left 1995    tree fell on her  . Knee arthroscopy Left   . Tubal ligation    . Cesarean section  2008    [facility]  . Cholecystectomy N/A 11/25/2014    Procedure: LAPAROSCOPIC CHOLECYSTECTOMY;  Surgeon: Aviva Signs Md, MD;  Location: [facility];  Service: General;  Laterality: N/A;    Family History  Problem Relation Age of Onset  . Hypertension Mother   . Hyperlipidemia Mother   . Fibromyalgia Mother   . Other Mother  degenerative disc disease  . Diabetes Father   . Hypertension Father   . Hyperlipidemia Father   . Heart disease Father   . Heart attack Father   . Stroke Father   . Hyperlipidemia Brother   . Hypertension Brother   . Colon cancer Neg Hx   . Inflammatory bowel disease Neg Hx   . Aneurysm Maternal Grandmother     AAA  . Aneurysm Paternal Grandmother     AAA    Social History   Social History  . Marital Status: Divorced    Spouse Name: N/A  . Number of Children: 1  . Years of Education: N/A   Occupational History  . Not on file.    Social History Main Topics  . Smoking status: Current Every Day Smoker -- 0.50 packs/day for 11 years    Types: Cigarettes  . Smokeless tobacco: Never Used  . Alcohol Use: No  . Drug Use: No  . Sexual Activity: Yes    Birth Control/ Protection: Surgical   Other Topics Concern  . Not on file   Social History Narrative      ROS:  General: Negative for anorexia, weight loss, fever, chills, fatigue, weakness. Eyes: Negative for vision changes.  ENT: Negative for hoarseness, difficulty swallowing , nasal congestion. CV: Negative for chest pain, angina, palpitations, dyspnea on exertion, peripheral edema.  Respiratory: Negative for dyspnea at rest, dyspnea on exertion, cough, sputum, wheezing.  GI: See history of present illness. GU:  Negative for dysuria, , urinary incontinence, urinary frequency, nocturnal urination. See history of present illness MS: Negative for joint pain, low back pain.  Derm: Negative for rash or itching.  Neuro: Negative for weakness, abnormal sensation, seizure, frequent headaches, memory loss, confusion.  Psych: Negative for anxiety, depression, suicidal ideation, hallucinations.  Endo: Negative for unusual weight change.  Heme: Negative for bruising or bleeding. Allergy: Negative for rash or hives.    Physical Examination:  BP 139/88 mmHg  Pulse 104  Temp(Src) 97.6 F (36.4 C) (Oral)  Ht 5\' 6"  (1.676 m)  Wt 329 lb 3.2 oz (149.324 kg)  BMI 53.16 kg/m2  LMP 11/12/2015   General: Well-nourished, well-developed in no acute distress.  Head: Normocephalic, atraumatic.   Eyes: Conjunctiva pink, no icterus. Mouth: Oropharyngeal mucosa moist and pink , no lesions erythema or exudate. Neck: Supple without thyromegaly, masses, or lymphadenopathy.  Lungs: Clear to auscultation bilaterally.  Heart: Regular rate and rhythm, no murmurs rubs or gallops.  Abdomen: Bowel sounds are normal, moderate epigastric tenderness, nondistended, no hepatosplenomegaly  or masses, no abdominal bruits or    hernia , no rebound or guarding.   Rectal: Not performed Extremities: No lower extremity edema. No clubbing or deformities.  Neuro: Alert and oriented x 4 , grossly normal neurologically.  Skin: Warm and dry, no rash or jaundice.   Psych: Alert and cooperative, normal mood and affect.  Labs: Lab Results  Component Value Date   CREATININE 0.56 12/09/2015   BUN 9 12/09/2015   NA 139 12/09/2015   K 4.1 12/09/2015   CL 107 12/09/2015   CO2 22 12/09/2015   Lab Results  Component Value Date   WBC 12.8* 12/09/2015   HGB 13.6 12/09/2015   HCT 39.9 12/09/2015   MCV 81.3 12/09/2015   PLT 265 12/09/2015   Lab Results  Component Value Date   ALT 28 12/09/2015   AST 29 12/09/2015   ALKPHOS 69 12/09/2015   BILITOT 0.5 12/09/2015     Imaging  Studies: No results found.

## 2015-12-21 NOTE — Discharge Instructions (Signed)
Esophagogastroduodenoscopy, Care After Refer to this sheet in the next few weeks. These instructions provide you with information about caring for yourself after your procedure. Your health care provider may also give you more specific instructions. Your treatment has been planned according to current medical practices, but problems sometimes occur. Call your health care provider if you have any problems or questions after your procedure. WHAT TO EXPECT AFTER THE PROCEDURE After your procedure, it is typical to feel:  Soreness in your throat.  Pain with swallowing.  Sick to your stomach (nauseous).  Bloated.  Dizzy.  Fatigued. HOME CARE INSTRUCTIONS  Do not eat or drink anything until the numbing medicine (local anesthetic) has worn off and your gag reflex has returned. You will know that the local anesthetic has worn off when you can swallow comfortably.  Do not drive or operate machinery until directed by your health care provider.  Take medicines only as directed by your health care provider. SEEK MEDICAL CARE IF:   You cannot stop coughing.  You are not urinating at all or less than usual. SEEK IMMEDIATE MEDICAL CARE IF:  You have difficulty swallowing.  You cannot eat or drink.  You have worsening throat or chest pain.  You have dizziness or lightheadedness or you faint.  You have nausea or vomiting.  You have chills.  You have a fever.  You have severe abdominal pain.  You have black, tarry, or bloody stools.   This information is not intended to replace advice given to you by your health care provider. Make sure you discuss any questions you have with your health care provider.   Document Released: 08/19/2012 Document Revised: 09/23/2014 Document Reviewed: 08/19/2012 Elsevier Interactive Patient Education 2016 [employer]   EGD Discharge instructions Please read the instructions outlined below and refer to this sheet in the next few weeks. These  discharge instructions provide you with general information on caring for yourself after you leave the hospital. Your doctor may also give you specific instructions. While your treatment has been planned according to the most current medical practices available, unavoidable complications occasionally occur. If you have any problems or questions after discharge, please call your doctor. ACTIVITY  You may resume your regular activity but move at a slower pace for the next 24 hours.   Take frequent rest periods for the next 24 hours.   Walking will help expel (get rid of) the air and reduce the bloated feeling in your abdomen.   No driving for 24 hours (because of the anesthesia (medicine) used during the test).   You may shower.   Do not sign any important legal documents or operate any machinery for 24 hours (because of the anesthesia used during the test).  NUTRITION  Drink plenty of fluids.   You may resume your normal diet.   Begin with a light meal and progress to your normal diet.   Avoid alcoholic beverages for 24 hours or as instructed by your caregiver.  MEDICATIONS  You may resume your normal medications unless your caregiver tells you otherwise.  WHAT YOU CAN EXPECT TODAY  You may experience abdominal discomfort such as a feeling of fullness or gas pains.  FOLLOW-UP  Your doctor will discuss the results of your test with you.  SEEK IMMEDIATE MEDICAL ATTENTION IF ANY OF THE FOLLOWING OCCUR:  Excessive nausea (feeling sick to your stomach) and/or vomiting.   Severe abdominal pain and distention (swelling).   Trouble swallowing.   Temperature over 101 F (  37.8 C).   Rectal bleeding or vomiting of blood.    We'll schedule a contrast CT of the abdomen and pelvis to further evaluate abdominal pain  Further recommendations to follow pending review of pathology report

## 2015-12-25 ENCOUNTER — Encounter: Payer: Self-pay | Admitting: Internal Medicine

## 2015-12-26 ENCOUNTER — Encounter (HOSPITAL_COMMUNITY): Payer: Self-pay | Admitting: Internal Medicine

## 2015-12-26 ENCOUNTER — Ambulatory Visit (HOSPITAL_COMMUNITY)
Admission: RE | Admit: 2015-12-26 | Discharge: 2015-12-26 | Disposition: A | Discharge status: Home / Self Care | Payer: Medicaid Other | Source: Ambulatory Visit | Attending: Internal Medicine | Admitting: Internal Medicine

## 2015-12-26 DIAGNOSIS — R918 Other nonspecific abnormal finding of lung field: Secondary | ICD-10-CM | POA: Diagnosis not present

## 2015-12-26 DIAGNOSIS — R1084 Generalized abdominal pain: Secondary | ICD-10-CM | POA: Insufficient documentation

## 2015-12-26 DIAGNOSIS — K439 Ventral hernia without obstruction or gangrene: Secondary | ICD-10-CM | POA: Insufficient documentation

## 2015-12-26 MED ORDER — IOPAMIDOL (ISOVUE-300) INJECTION 61%
125.0000 mL | Freq: Once | INTRAVENOUS | Status: AC | PRN
  Administered 2015-12-26: 125 mL via INTRAVENOUS

## 2016-01-01 ENCOUNTER — Other Ambulatory Visit: Payer: Self-pay

## 2016-01-01 DIAGNOSIS — R9389 Abnormal findings on diagnostic imaging of other specified body structures: Secondary | ICD-10-CM

## 2016-01-01 DIAGNOSIS — K439 Ventral hernia without obstruction or gangrene: Secondary | ICD-10-CM

## 2016-01-08 ENCOUNTER — Ambulatory Visit: Payer: Self-pay | Admitting: Gastroenterology

## 2016-01-11 ENCOUNTER — Ambulatory Visit
Admission: RE | Admit: 2016-01-11 | Discharge: 2016-01-11 | Disposition: A | Discharge status: Home / Self Care | Payer: Medicaid Other | Source: Ambulatory Visit | Attending: Internal Medicine | Admitting: Internal Medicine

## 2016-01-11 DIAGNOSIS — R9389 Abnormal findings on diagnostic imaging of other specified body structures: Secondary | ICD-10-CM

## 2016-01-11 MED ORDER — GADOBENATE DIMEGLUMINE 529 MG/ML IV SOLN
20.0000 mL | Freq: Once | INTRAVENOUS | Status: AC | PRN
  Administered 2016-01-11: 20 mL via INTRAVENOUS

## 2016-01-15 ENCOUNTER — Telehealth: Payer: Self-pay | Admitting: Internal Medicine

## 2016-01-15 NOTE — Telephone Encounter (Signed)
I spoke with the pt, see result note. 

## 2016-01-15 NOTE — Progress Notes (Signed)
ON RECALL  °

## 2016-01-15 NOTE — Telephone Encounter (Signed)
Pt called this morning asking if her MRI results were available. Please call [phone]

## 2016-01-25 ENCOUNTER — Encounter (HOSPITAL_COMMUNITY)
Admission: RE | Admit: 2016-01-25 | Discharge: 2016-01-25 | Disposition: A | Discharge status: Home / Self Care | Payer: Medicaid Other | Source: Ambulatory Visit | Attending: General Surgery | Admitting: General Surgery

## 2016-01-25 ENCOUNTER — Encounter (HOSPITAL_COMMUNITY): Payer: Self-pay

## 2016-01-25 DIAGNOSIS — Z01812 Encounter for preprocedural laboratory examination: Secondary | ICD-10-CM | POA: Diagnosis present

## 2016-01-25 DIAGNOSIS — F419 Anxiety disorder, unspecified: Secondary | ICD-10-CM | POA: Insufficient documentation

## 2016-01-25 DIAGNOSIS — R51 Headache: Secondary | ICD-10-CM | POA: Diagnosis not present

## 2016-01-25 DIAGNOSIS — F17200 Nicotine dependence, unspecified, uncomplicated: Secondary | ICD-10-CM | POA: Diagnosis not present

## 2016-01-25 DIAGNOSIS — K432 Incisional hernia without obstruction or gangrene: Secondary | ICD-10-CM | POA: Insufficient documentation

## 2016-01-25 LAB — CBC WITH DIFFERENTIAL/PLATELET
BASOS PCT: 0 %
Basophils Absolute: 0 10*3/uL (ref 0.0–0.1)
EOS PCT: 1 %
Eosinophils Absolute: 0.2 10*3/uL (ref 0.0–0.7)
HEMATOCRIT: 44.3 % (ref 36.0–46.0)
Hemoglobin: 14.7 g/dL (ref 12.0–15.0)
LYMPHS PCT: 25 %
Lymphs Abs: 3.6 10*3/uL (ref 0.7–4.0)
MCH: 27.3 pg (ref 26.0–34.0)
MCHC: 33.2 g/dL (ref 30.0–36.0)
MCV: 82.3 fL (ref 78.0–100.0)
MONO ABS: 0.7 10*3/uL (ref 0.1–1.0)
MONOS PCT: 5 %
NEUTROS ABS: 10.1 10*3/uL — AB (ref 1.7–7.7)
NEUTROS PCT: 69 %
PLATELETS: 274 10*3/uL (ref 150–400)
RBC: 5.38 MIL/uL — ABNORMAL HIGH (ref 3.87–5.11)
RDW: 14.4 % (ref 11.5–15.5)
WBC: 14.6 10*3/uL — ABNORMAL HIGH (ref 4.0–10.5)

## 2016-01-25 LAB — BASIC METABOLIC PANEL
ANION GAP: 11 (ref 5–15)
BUN: 8 mg/dL (ref 6–20)
CALCIUM: 9.1 mg/dL (ref 8.9–10.3)
CO2: 23 mmol/L (ref 22–32)
CREATININE: 0.71 mg/dL (ref 0.44–1.00)
Chloride: 104 mmol/L (ref 101–111)
Glucose, Bld: 124 mg/dL — ABNORMAL HIGH (ref 65–99)
Potassium: 3.9 mmol/L (ref 3.5–5.1)
Sodium: 138 mmol/L (ref 135–145)

## 2016-01-25 NOTE — H&P (Signed)
  NTS SOAP Note  Vital Signs:  Vitals as of: XX123456: Systolic XX123456: Diastolic 123456: Heart Rate 96: Temp 52F (Temporal): Height 39ft 5in: Weight 333Lbs 0 Ounces: Pain Level 9: BMI 55.41   BMI : 55.41 kg/m2  Subjective: This 30 year old female presents for of abdominal swelling and pain.  Does suffer from chronic abdominal pain, but recently has noted swelling in epigastric region when straining or coughing.  Actually complains of periumbilical pain.  Has been present for several months.  Swelling relieved when lying down.  No nausea, vomiting noted.  No right upper quadrant abdominal pain, fever, chills.  Review of Symptoms:  Constitutional:negative Head:negative Eyes:negative Nose/Mouth/Throat:negative Cardiovascular:negative Respiratory:negative Gastrointestinabdominal pain, heartburn Genitourinary:negative back Skin:negative Hematolgic/Lymphatic:negative Allergic/Immunologic:negative   Past Medical History:Reviewed  Past Medical History  Surgical History: lap cholecystectomy, knee surgery, left femur surgery Medical Problems: mood disorder, anxiety, obesity, chronic headaches Allergies: nkda Medications: depakote, ativan   Social History:Reviewed  Social History  Preferred Language: English Race:  White Ethnicity: Not Hispanic / Latino Age: 67 year Marital Status:  S Alcohol: no   Smoking Status: Current every day smoker reviewed on 01/25/2016 Started Date:  Packs per week:  Functional Status reviewed on 01/25/2016 ------------------------------------------------ Bathing: Normal Cooking: Normal Dressing: Normal Driving: Normal Eating: Normal Managing Meds: Normal Oral Care: Normal Shopping: Normal Toileting: Normal Transferring: Normal Walking: Normal Cognitive Status reviewed on 01/25/2016 ------------------------------------------------ Attention: Normal Decision Making: Normal Language: Normal Memory: Normal Motor:  Normal Perception: Normal Problem Solving: Normal Visual and Spatial: Normal   Family History:Reviewed  Family Health History Mother, Living; Hypertension (high blood pressure);  Father, Living; Heart disease;     Objective Information: General:Well appearing, well nourished in no distress. Head:Atraumatic; no masses; no abnormalities Eyes:conjunctiva clear, EOM intact, PERRL Neck:Supple without lymphadenopathy.  Heart:RRR, no murmur or gallop.  Normal S1, S2.  No S3, S4.  Lungs:CTA bilaterally, no wheezes, rhonchi, rales.  Breathing unlabored. Abdomen:Soft, NT/ND, normal bowel sounds, no HSM, no masses.  No peritoneal signs.  Incisional hernia noted in epigastric region at surgical incision site.  Umbilicus normal, no hernia. CT scan reviewed. Assessment:Incisional hernia  Diagnoses: 553.21  K43.2 Incisional hernia (Incisional hernia without obstruction or gangrene)  Procedures: [id number] - OFFICE OUTPATIENT VISIT 25 MINUTES    Plan:  Scheduled for incisional herniorrhaphy with mesh on 01/29/16.   Patient Education:Alternative treatments to surgery were discussed with patient (and family).Risks and benefits  of procedure including bleeding, infection, mesh use, and recurrence of the hernia were fully explained to the patient (and family) who gave informed consent. Patient/family questions were addressed.  Follow-up:Pending Surgery

## 2016-01-25 NOTE — Patient Instructions (Signed)
Patricia Stanton  01/25/2016     @PREFPERIOPPHARMACY @   Your procedure is scheduled on 01/29/16.  Report to [name] at 7:30 A.M.  Call this number if you have problems the morning of surgery:  [phone]   Remember:  Do not eat food or drink liquids after midnight.  Take these medicines the morning of surgery with A SIP OF WATER Depakote, Prpcid, Hydrocodone, Linzess, Ativan, Zofran   Do not wear jewelry, make-up or nail polish.  Do not wear lotions, powders, or perfumes.  You may wear deodorant.  Do not shave 48 hours prior to surgery.  Men may shave face and neck.  Do not bring valuables to the hospital.  [facility] is not responsible for any belongings or valuables.  Contacts, dentures or bridgework may not be worn into surgery.  Leave your suitcase in the car.  After surgery it may be brought to your room.  For patients admitted to the hospital, discharge time will be determined by your treatment team.  Patients discharged the day of surgery will not be allowed to drive home.    Please read over the following fact sheets that you were given. Surgical Site Infection Prevention and Anesthesia Post-op Instructions     PATIENT INSTRUCTIONS POST-ANESTHESIA  IMMEDIATELY FOLLOWING SURGERY:  Do not drive or operate machinery for the first twenty four hours after surgery.  Do not make any important decisions for twenty four hours after surgery or while taking narcotic pain medications or sedatives.  If you develop intractable nausea and vomiting or a severe headache please notify your doctor immediately.  FOLLOW-UP:  Please make an appointment with your surgeon as instructed. You do not need to follow up with anesthesia unless specifically instructed to do so.  WOUND CARE INSTRUCTIONS (if applicable):  Keep a dry clean dressing on the anesthesia/puncture wound site if there is drainage.  Once the wound has quit draining you may leave it open to air.  Generally you should  leave the bandage intact for twenty four hours unless there is drainage.  If the epidural site drains for more than 36-48 hours please call the anesthesia department.  QUESTIONS?:  Please feel free to call your physician or the hospital operator if you have any questions, and they will be happy to assist you.      Hernia, Adult A hernia is the bulging of an organ or tissue through a weak spot in the muscles of the abdomen (abdominal wall). Hernias develop most often near the navel or groin. There are many kinds of hernias. Common kinds include:  Femoral hernia. This kind of hernia develops under the groin in the upper thigh area.  Inguinal hernia. This kind of hernia develops in the groin or scrotum.  Umbilical hernia. This kind of hernia develops near the navel.  Hiatal hernia. This kind of hernia causes part of the stomach to be pushed up into the chest.  Incisional hernia. This kind of hernia bulges through a scar from an abdominal surgery. CAUSES This condition may be caused by:  Heavy lifting.  Coughing over a long period of time.  Straining to have a bowel movement.  An incision made during an abdominal surgery.  A birth defect (congenital defect).  Excess weight or obesity.  Smoking.  Poor nutrition.  Cystic fibrosis.  Excess fluid in the abdomen.  Undescended testicles. SYMPTOMS Symptoms of a hernia include:  A lump on the abdomen. This is the first sign of a hernia. The  lump may become more obvious with standing, straining, or coughing. It may get bigger over time if it is not treated or if the condition causing it is not treated.  Pain. A hernia is usually painless, but it may become painful over time if treatment is delayed. The pain is usually dull and may get worse with standing or lifting heavy objects. Sometimes a hernia gets tightly squeezed in the weak spot (strangulated) or stuck there (incarcerated) and causes additional symptoms. These symptoms may  include:  Vomiting.  Nausea.  Constipation.  Irritability. DIAGNOSIS A hernia may be diagnosed with:  A physical exam. During the exam your health care provider may ask you to cough or to make a specific movement, because a hernia is usually more visible when you move.  Imaging tests. These can include:  X-rays.  Ultrasound.  CT scan. TREATMENT A hernia that is small and painless may not need to be treated. A hernia that is large or painful may be treated with surgery. Inguinal hernias may be treated with surgery to prevent incarceration or strangulation. Strangulated hernias are always treated with surgery, because lack of blood to the trapped organ or tissue can cause it to die. Surgery to treat a hernia involves pushing the bulge back into place and repairing the weak part of the abdomen. HOME CARE INSTRUCTIONS  Avoid straining.  Do not lift anything heavier than 10 lb (4.5 kg).  Lift with your leg muscles, not your back muscles. This helps avoid strain.  When coughing, try to cough gently.  Prevent constipation. Constipation leads to straining with bowel movements, which can make a hernia worse or cause a hernia repair to break down. You can prevent constipation by:  Eating a high-fiber diet that includes plenty of fruits and vegetables.  Drinking enough fluids to keep your urine clear or pale yellow. Aim to drink 6-8 glasses of water per day.  Using a stool softener as directed by your health care provider.  Lose weight, if you are overweight.  Do not use any tobacco products, including cigarettes, chewing tobacco, or electronic cigarettes. If you need help quitting, ask your health care provider.  Keep all follow-up visits as directed by your health care provider. This is important. Your health care provider may need to monitor your condition. SEEK MEDICAL CARE IF:  You have swelling, redness, and pain in the affected area.  Your bowel habits change. SEEK  IMMEDIATE MEDICAL CARE IF:  You have a fever.  You have abdominal pain that is getting worse.  You feel nauseous or you vomit.  You cannot push the hernia back in place by gently pressing on it while you are lying down.  The hernia:  Changes in shape or size.  Is stuck outside the abdomen.  Becomes discolored.  Feels hard or tender.   This information is not intended to replace advice given to you by your health care provider. Make sure you discuss any questions you have with your health care provider.   Document Released: 09/02/2005 Document Revised: 09/23/2014 Document Reviewed: 07/13/2014 Elsevier Interactive Patient Education [employer].

## 2016-01-29 ENCOUNTER — Ambulatory Visit (HOSPITAL_COMMUNITY)
Admission: RE | Admit: 2016-01-29 | Discharge: 2016-01-29 | Disposition: A | Discharge status: Home / Self Care | Payer: Medicaid Other | Source: Ambulatory Visit | Attending: General Surgery | Admitting: General Surgery

## 2016-01-29 ENCOUNTER — Encounter (HOSPITAL_COMMUNITY)
Admission: RE | Disposition: A | Discharge status: Home / Self Care | Payer: Self-pay | Source: Ambulatory Visit | Attending: General Surgery

## 2016-01-29 ENCOUNTER — Encounter (HOSPITAL_COMMUNITY): Payer: Self-pay | Admitting: *Deleted

## 2016-01-29 ENCOUNTER — Ambulatory Visit (HOSPITAL_COMMUNITY): Payer: Medicaid Other | Admitting: Anesthesiology

## 2016-01-29 DIAGNOSIS — F39 Unspecified mood [affective] disorder: Secondary | ICD-10-CM | POA: Insufficient documentation

## 2016-01-29 DIAGNOSIS — Z79899 Other long term (current) drug therapy: Secondary | ICD-10-CM | POA: Diagnosis not present

## 2016-01-29 DIAGNOSIS — Z6841 Body Mass Index (BMI) 40.0 and over, adult: Secondary | ICD-10-CM | POA: Insufficient documentation

## 2016-01-29 DIAGNOSIS — F17200 Nicotine dependence, unspecified, uncomplicated: Secondary | ICD-10-CM | POA: Insufficient documentation

## 2016-01-29 DIAGNOSIS — K432 Incisional hernia without obstruction or gangrene: Secondary | ICD-10-CM | POA: Diagnosis present

## 2016-01-29 DIAGNOSIS — F419 Anxiety disorder, unspecified: Secondary | ICD-10-CM | POA: Insufficient documentation

## 2016-01-29 HISTORY — PX: INSERTION OF MESH: SHX5868

## 2016-01-29 HISTORY — PX: INCISIONAL HERNIA REPAIR: SHX193

## 2016-01-29 LAB — PREGNANCY, URINE: Preg Test, Ur: NEGATIVE

## 2016-01-29 SURGERY — REPAIR, HERNIA, INCISIONAL
Anesthesia: General

## 2016-01-29 MED ORDER — ROCURONIUM BROMIDE 50 MG/5ML IV SOLN
INTRAVENOUS | Status: AC
  Filled 2016-01-29: qty 1

## 2016-01-29 MED ORDER — BUPIVACAINE HCL (PF) 0.5 % IJ SOLN
INTRAMUSCULAR | Status: DC | PRN
  Administered 2016-01-29: 10 mL

## 2016-01-29 MED ORDER — CEFAZOLIN SODIUM 1-5 GM-% IV SOLN
INTRAVENOUS | Status: AC
  Filled 2016-01-29: qty 50

## 2016-01-29 MED ORDER — LACTATED RINGERS IV SOLN
INTRAVENOUS | Status: DC
  Administered 2016-01-29: 09:00:00 via INTRAVENOUS

## 2016-01-29 MED ORDER — FENTANYL CITRATE (PF) 100 MCG/2ML IJ SOLN
INTRAMUSCULAR | Status: DC | PRN
  Administered 2016-01-29: 25 ug via INTRAVENOUS
  Administered 2016-01-29 (×3): 50 ug via INTRAVENOUS

## 2016-01-29 MED ORDER — CEFAZOLIN SODIUM 1-5 GM-% IV SOLN
1.0000 g | Freq: Three times a day (TID) | INTRAVENOUS | Status: DC
  Administered 2016-01-29: 1 g via INTRAVENOUS

## 2016-01-29 MED ORDER — KETOROLAC TROMETHAMINE 30 MG/ML IJ SOLN
INTRAMUSCULAR | Status: AC
  Filled 2016-01-29: qty 1

## 2016-01-29 MED ORDER — GLYCOPYRROLATE 0.2 MG/ML IJ SOLN
INTRAMUSCULAR | Status: AC
  Filled 2016-01-29: qty 3

## 2016-01-29 MED ORDER — DEXTROSE 5 % IV SOLN: 3.0000 g | INTRAVENOUS | Status: DC

## 2016-01-29 MED ORDER — LIDOCAINE HCL (CARDIAC) 10 MG/ML IV SOLN
INTRAVENOUS | Status: DC | PRN
  Administered 2016-01-29: 50 mg via INTRAVENOUS

## 2016-01-29 MED ORDER — ARTIFICIAL TEARS OP OINT
TOPICAL_OINTMENT | OPHTHALMIC | Status: AC
  Filled 2016-01-29: qty 3.5

## 2016-01-29 MED ORDER — POVIDONE-IODINE 10 % EX OINT
TOPICAL_OINTMENT | CUTANEOUS | Status: AC
  Filled 2016-01-29: qty 1

## 2016-01-29 MED ORDER — GLYCOPYRROLATE 0.2 MG/ML IJ SOLN
0.2000 mg | Freq: Once | INTRAMUSCULAR | Status: AC
  Administered 2016-01-29: 0.2 mg via INTRAVENOUS

## 2016-01-29 MED ORDER — GLYCOPYRROLATE 0.2 MG/ML IJ SOLN
INTRAMUSCULAR | Status: AC
Start: 2016-01-29 — End: 2016-01-29
  Filled 2016-01-29: qty 1

## 2016-01-29 MED ORDER — ROCURONIUM BROMIDE 100 MG/10ML IV SOLN
INTRAVENOUS | Status: DC | PRN
  Administered 2016-01-29: 25 mg via INTRAVENOUS
  Administered 2016-01-29: 5 mg via INTRAVENOUS

## 2016-01-29 MED ORDER — NEOSTIGMINE METHYLSULFATE 10 MG/10ML IV SOLN
INTRAVENOUS | Status: DC | PRN
  Administered 2016-01-29: 3 mg via INTRAVENOUS

## 2016-01-29 MED ORDER — CEFAZOLIN SODIUM-DEXTROSE 2-4 GM/100ML-% IV SOLN
INTRAVENOUS | Status: AC
  Filled 2016-01-29: qty 100

## 2016-01-29 MED ORDER — CHLORHEXIDINE GLUCONATE 4 % EX LIQD: 1.0000 "application " | Freq: Once | CUTANEOUS | Status: DC

## 2016-01-29 MED ORDER — KETOROLAC TROMETHAMINE 30 MG/ML IJ SOLN
30.0000 mg | Freq: Once | INTRAMUSCULAR | Status: AC
  Administered 2016-01-29: 30 mg via INTRAVENOUS

## 2016-01-29 MED ORDER — ONDANSETRON HCL 4 MG/2ML IJ SOLN
INTRAMUSCULAR | Status: AC
  Filled 2016-01-29: qty 2

## 2016-01-29 MED ORDER — LIDOCAINE HCL (PF) 1 % IJ SOLN
INTRAMUSCULAR | Status: AC
  Filled 2016-01-29: qty 5

## 2016-01-29 MED ORDER — DEXAMETHASONE SODIUM PHOSPHATE 4 MG/ML IJ SOLN
INTRAMUSCULAR | Status: AC
  Filled 2016-01-29: qty 1

## 2016-01-29 MED ORDER — 0.9 % SODIUM CHLORIDE (POUR BTL) OPTIME
TOPICAL | Status: DC | PRN
  Administered 2016-01-29: 1000 mL

## 2016-01-29 MED ORDER — CEFAZOLIN SODIUM-DEXTROSE 2-4 GM/100ML-% IV SOLN
2.0000 g | Freq: Once | INTRAVENOUS | Status: AC
  Administered 2016-01-29: 2 g via INTRAVENOUS

## 2016-01-29 MED ORDER — HYDROCODONE-ACETAMINOPHEN 5-325 MG PO TABS: 1.0000 | ORAL_TABLET | ORAL | Status: DC | PRN

## 2016-01-29 MED ORDER — ONDANSETRON HCL 4 MG/2ML IJ SOLN
4.0000 mg | Freq: Once | INTRAMUSCULAR | Status: DC | PRN
Start: 2016-01-29 — End: 2016-01-29

## 2016-01-29 MED ORDER — DEXAMETHASONE SODIUM PHOSPHATE 4 MG/ML IJ SOLN
4.0000 mg | Freq: Once | INTRAMUSCULAR | Status: AC
  Administered 2016-01-29: 4 mg via INTRAVENOUS

## 2016-01-29 MED ORDER — PROPOFOL 10 MG/ML IV BOLUS
INTRAVENOUS | Status: DC | PRN
  Administered 2016-01-29: 50 mg via INTRAVENOUS
  Administered 2016-01-29: 150 mg via INTRAVENOUS

## 2016-01-29 MED ORDER — MIDAZOLAM HCL 2 MG/2ML IJ SOLN
INTRAMUSCULAR | Status: AC
  Filled 2016-01-29: qty 2

## 2016-01-29 MED ORDER — PROPOFOL 10 MG/ML IV BOLUS
INTRAVENOUS | Status: AC
  Filled 2016-01-29: qty 20

## 2016-01-29 MED ORDER — BUPIVACAINE HCL (PF) 0.5 % IJ SOLN
INTRAMUSCULAR | Status: AC
  Filled 2016-01-29: qty 30

## 2016-01-29 MED ORDER — GLYCOPYRROLATE 0.2 MG/ML IJ SOLN
INTRAMUSCULAR | Status: DC | PRN
  Administered 2016-01-29: 0.6 mg via INTRAVENOUS

## 2016-01-29 MED ORDER — POVIDONE-IODINE 10 % OINT PACKET
TOPICAL_OINTMENT | CUTANEOUS | Status: DC | PRN
  Administered 2016-01-29: 1 via TOPICAL

## 2016-01-29 MED ORDER — ARTIFICIAL TEARS OP OINT
TOPICAL_OINTMENT | OPHTHALMIC | Status: DC | PRN
  Administered 2016-01-29: 1 via OPHTHALMIC

## 2016-01-29 MED ORDER — FENTANYL CITRATE (PF) 100 MCG/2ML IJ SOLN
25.0000 ug | INTRAMUSCULAR | Status: DC | PRN
  Administered 2016-01-29: 25 ug via INTRAVENOUS
  Filled 2016-01-29: qty 2

## 2016-01-29 MED ORDER — FENTANYL CITRATE (PF) 250 MCG/5ML IJ SOLN
INTRAMUSCULAR | Status: AC
  Filled 2016-01-29: qty 5

## 2016-01-29 MED ORDER — SUCCINYLCHOLINE CHLORIDE 20 MG/ML IJ SOLN
INTRAMUSCULAR | Status: DC | PRN
  Administered 2016-01-29: 140 mg via INTRAVENOUS

## 2016-01-29 MED ORDER — MIDAZOLAM HCL 2 MG/2ML IJ SOLN
1.0000 mg | INTRAMUSCULAR | Status: DC | PRN
  Administered 2016-01-29: 2 mg via INTRAVENOUS

## 2016-01-29 MED ORDER — ONDANSETRON HCL 4 MG/2ML IJ SOLN
4.0000 mg | Freq: Once | INTRAMUSCULAR | Status: AC
  Administered 2016-01-29: 4 mg via INTRAVENOUS

## 2016-01-29 SURGICAL SUPPLY — 38 items
BAG HAMPER (MISCELLANEOUS) ×4 IMPLANT
BLADE SURG SZ11 CARB STEEL (BLADE) IMPLANT
CHLORAPREP W/TINT 26ML (MISCELLANEOUS) ×4 IMPLANT
CLOTH BEACON ORANGE TIMEOUT ST (SAFETY) ×4 IMPLANT
COVER LIGHT HANDLE STERIS (MISCELLANEOUS) ×8 IMPLANT
DECANTER SPIKE VIAL GLASS SM (MISCELLANEOUS) ×4 IMPLANT
ELECT REM PT RETURN 9FT ADLT (ELECTROSURGICAL) ×4
ELECTRODE REM PT RTRN 9FT ADLT (ELECTROSURGICAL) ×2 IMPLANT
GAUZE SPONGE 4X4 12PLY STRL (GAUZE/BANDAGES/DRESSINGS) ×4 IMPLANT
GLOVE BIOGEL PI IND STRL 7.0 (GLOVE) ×2 IMPLANT
GLOVE BIOGEL PI IND STRL 7.5 (GLOVE) ×2 IMPLANT
GLOVE BIOGEL PI INDICATOR 7.0 (GLOVE) ×2
GLOVE BIOGEL PI INDICATOR 7.5 (GLOVE) ×2
GLOVE ECLIPSE 7.0 STRL STRAW (GLOVE) ×4 IMPLANT
GLOVE SKINSENSE NS SZ6.5 (GLOVE) ×2
GLOVE SKINSENSE STRL SZ6.5 (GLOVE) ×2 IMPLANT
GLOVE SURG SS PI 7.5 STRL IVOR (GLOVE) ×4 IMPLANT
GOWN STRL REUS W/ TWL LRG LVL3 (GOWN DISPOSABLE) ×4 IMPLANT
GOWN STRL REUS W/ TWL XL LVL3 (GOWN DISPOSABLE) ×2 IMPLANT
GOWN STRL REUS W/TWL LRG LVL3 (GOWN DISPOSABLE) ×4
GOWN STRL REUS W/TWL XL LVL3 (GOWN DISPOSABLE) ×2
INST SET MINOR GENERAL (KITS) ×4 IMPLANT
KIT ROOM TURNOVER APOR (KITS) ×4 IMPLANT
MANIFOLD NEPTUNE II (INSTRUMENTS) ×4 IMPLANT
MESH VENTRALEX ST 2.5 CRC MED (Mesh General) ×4 IMPLANT
NEEDLE HYPO 25X1 1.5 SAFETY (NEEDLE) ×4 IMPLANT
NS IRRIG 1000ML POUR BTL (IV SOLUTION) ×4 IMPLANT
PACK MINOR (CUSTOM PROCEDURE TRAY) ×4 IMPLANT
PAD ARMBOARD 7.5X6 YLW CONV (MISCELLANEOUS) ×4 IMPLANT
SET BASIN LINEN APH (SET/KITS/TRAYS/PACK) ×4 IMPLANT
STAPLER VISISTAT (STAPLE) ×4 IMPLANT
SUT ETHIBOND 0 MO6 C/R (SUTURE) ×4 IMPLANT
SUT VIC AB 2-0 CT1 27 (SUTURE) ×2
SUT VIC AB 2-0 CT1 TAPERPNT 27 (SUTURE) ×2 IMPLANT
SUT VIC AB 3-0 SH 27 (SUTURE) ×2
SUT VIC AB 3-0 SH 27X BRD (SUTURE) ×2 IMPLANT
SYR CONTROL 10ML LL (SYRINGE) ×4 IMPLANT
TAPE CLOTH SURG 4X10 WHT LF (GAUZE/BANDAGES/DRESSINGS) ×4 IMPLANT

## 2016-01-29 NOTE — Interval H&P Note (Signed)
History and Physical Interval Note:  01/29/2016 8:40 AM  Patricia Stanton  has presented today for surgery, with the diagnosis of incisional hernia  The various methods of treatment have been discussed with the patient and family. After consideration of risks, benefits and other options for treatment, the patient has consented to  Procedure(s): HERNIA REPAIR INCISIONAL WITH MESH (N/A) as a surgical intervention .  The patient's history has been reviewed, patient examined, no change in status, stable for surgery.  I have reviewed the patient's chart and labs.  Questions were answered to the patient's satisfaction.     Aviva Signs A

## 2016-01-29 NOTE — Anesthesia Preprocedure Evaluation (Signed)
Anesthesia Evaluation  Patient identified by MRN, date of birth, ID band Patient awake    Reviewed: Allergy & Precautions, NPO status , Patient's Chart, lab work & pertinent test results  Airway Mallampati: III  TM Distance: >3 FB Neck ROM: Full    Dental  (+) Teeth Intact, Dental Advisory Given   Pulmonary pneumonia -, resolved, Current Smoker,  breath sounds clear to auscultation        Cardiovascular negative cardio ROS  Rhythm:Regular Rate:Normal     Neuro/Psych  Headaches, PSYCHIATRIC DISORDERS Anxiety    GI/Hepatic negative GI ROS,   Endo/Other  Morbid obesity  Renal/GU      Musculoskeletal   Abdominal   Peds  Hematology   Anesthesia Other Findings   Reproductive/Obstetrics                            Anesthesia Physical Anesthesia Plan  ASA: II  Anesthesia Plan: General   Post-op Pain Management:    Induction: Intravenous, Rapid sequence and Cricoid pressure planned  Airway Management Planned: Oral ETT  Additional Equipment:   Intra-op Plan:   Post-operative Plan: Extubation in OR  Informed Consent: I have reviewed the patients History and Physical, chart, labs and discussed the procedure including the risks, benefits and alternatives for the proposed anesthesia with the patient or authorized representative who has indicated his/her understanding and acceptance.     Plan Discussed with:   Anesthesia Plan Comments:         Anesthesia Quick Evaluation  

## 2016-01-29 NOTE — Op Note (Signed)
Patient:  Patricia Stanton  DOB:  02-Dec-1985  MRN:  [id number]   Preop Diagnosis:  Incisional hernia  Postop Diagnosis:  Same  Procedure:  Incisional herniorrhaphy with mesh  Surgeon:  Aviva Signs, M.D.  Anes:  Gen. endotracheal  Indications:  Patient is a 30 year old white female status post laparoscopic cholecystectomy in the past who presents with an epigastric incisional hernia. The risks and benefits of the procedure including bleeding, infection, mesh use, and the possibility of recurrence of the hernia were fully explained to the patient, who gave informed consent. The patient was placed the supine position. After induction of general endotracheal anesthesia, the abdomen was prepped and draped using the usual sterile technique with DuraPrep. Surgical site confirmation was performed.   Procedure note:  An incision was made through the previous transverse epigastric incision site. The dissection was taken down to the fascia. The patient had an ovoid 3 cm hernia defect. Omentum was in this hernia. The hernia sac was excised and disposed of. The hernia contents were reduced without difficulty. A 6.4 cm Bard ventralax ST patch was then inserted. It was secured to the fascia using 0 Ethibond interrupted sutures. The overlying fascia was reapproximated transversely using 0 Ethibond interrupted sutures. The subcutaneous layer was reapproximated using 2-0 Vicryl interrupted sutures. The skin was closed using staples. 0.5% Sensorcaine was instilled into the surrounding wound. Betadine ointment and a dry sterile dressing were applied.  All tape and needle counts were correct at the end of the procedure. The patient was extubated in the operating room and transferred to PACU in stable condition.   Complications:  None  EBL:  Minimal  Specimen:  None

## 2016-01-29 NOTE — Anesthesia Postprocedure Evaluation (Signed)
Anesthesia Post Note  Patient: Patricia Stanton  Procedure(s) Performed: Procedure(s) (LRB): INCISIONAL HERNIORRHAPHY WITH MESH (N/A) INSERTION OF MESH  Patient location during evaluation: PACU Anesthesia Type: General Level of consciousness: awake and alert Pain management: satisfactory to patient Vital Signs Assessment: post-procedure vital signs reviewed and stable Respiratory status: spontaneous breathing Cardiovascular status: stable Anesthetic complications: no    Last Vitals:  Filed Vitals:   01/29/16 0835 01/29/16 1004  BP: 122/82 109/71  Pulse:  105  Temp:  36.6 C  Resp: 28 21    Last Pain:  Filed Vitals:   01/29/16 1013  PainSc: 0-No pain                 Carmita Boom

## 2016-01-29 NOTE — Anesthesia Procedure Notes (Signed)
Procedure Name: Intubation Date/Time: 01/29/2016 9:17 AM Performed by: Vista Deck Pre-anesthesia Checklist: Patient identified, Emergency Drugs available, Suction available, Patient being monitored and Timeout performed Patient Re-evaluated:Patient Re-evaluated prior to inductionOxygen Delivery Method: Circle system utilized Preoxygenation: Pre-oxygenation with 100% oxygen Intubation Type: IV induction, Rapid sequence and Cricoid Pressure applied Laryngoscope Size: Glidescope and 3 (Lo PRO) Grade View: Grade I Tube type: Oral Tube size: 7.0 mm Number of attempts: 1 Airway Equipment and Method: Video-laryngoscopy,  Stylet and Oral airway Placement Confirmation: ETT inserted through vocal cords under direct vision,  positive ETCO2 and breath sounds checked- equal and bilateral Secured at: 22 cm Tube secured with: Tape Dental Injury: Teeth and Oropharynx as per pre-operative assessment

## 2016-01-29 NOTE — Discharge Instructions (Signed)
Ventral Hernia A ventral hernia (also called an incisional hernia) is a hernia that occurs at the site of a previous surgical cut (incision) in the abdomen. The abdominal wall spans from your lower chest down to your pelvis. If the abdominal wall is weakened from a surgical incision, a hernia can occur. A hernia is a bulge of bowel or muscle tissue pushing out on the weakened part of the abdominal wall. Ventral hernias can get bigger from straining or lifting. Obese and older people are at higher risk for a ventral hernia. People who develop infections after surgery or require repeat incisions at the same site on the abdomen are also at increased risk. CAUSES  A ventral hernia occurs because of weakness in the abdominal wall at an incision site.  SYMPTOMS  Common symptoms include:  A visible bulge or lump on the abdominal wall.  Pain or tenderness around the lump.  Increased discomfort if you cough or make a sudden movement. If the hernia has blocked part of the intestine, a serious complication can occur (incarcerated or strangulated hernia). This can become a problem that requires emergency surgery because the blood flow to the blocked intestine may be cut off. Symptoms may include:  Feeling sick to your stomach (nauseous).  Throwing up (vomiting).  Stomach swelling (distention) or bloating.  Fever.  Rapid heartbeat. DIAGNOSIS  Your health care provider will take a medical history and perform a physical exam. Various tests may be ordered, such as:  Blood tests.  Urine tests.  Ultrasonography.  X-rays.  Computed tomography (CT). TREATMENT  Watchful waiting may be all that is needed for a smaller hernia that does not cause symptoms. Your health care provider may recommend the use of a supportive belt (truss) that helps to keep the abdominal wall intact. For larger hernias or those that cause pain, surgery to repair the hernia is usually recommended. If a hernia becomes  strangulated, emergency surgery needs to be done right away. HOME CARE INSTRUCTIONS  Avoid putting pressure or strain on the abdominal area.  Avoid heavy lifting.  Use good body positioning for physical tasks. Ask your health care provider about proper body positioning.  Use a supportive belt as directed by your health care provider.  Maintain a healthy weight.  Eat foods that are high in fiber, such as whole grains, fruits, and vegetables. Fiber helps prevent difficult bowel movements (constipation).  Drink enough fluids to keep your urine clear or pale yellow.  Follow up with your health care provider as directed. SEEK MEDICAL CARE IF:   Your hernia seems to be getting larger or more painful. SEEK IMMEDIATE MEDICAL CARE IF:   You have abdominal pain that is sudden and sharp.  Your pain becomes severe.  You have repeated vomiting.  You are sweating a lot.  You notice a rapid heartbeat.  You develop a fever. MAKE SURE YOU:   Understand these instructions.  Will watch your condition.  Will get help right away if you are not doing well or get worse.   This information is not intended to replace advice given to you by your health care provider. Make sure you discuss any questions you have with your health care provider.   Document Released: 08/19/2012 Document Revised: 09/23/2014 Document Reviewed: 08/19/2012 Elsevier Interactive Patient Education 2016 Elsevier Inc.  

## 2016-01-29 NOTE — Transfer of Care (Signed)
Immediate Anesthesia Transfer of Care Note  Patient: Patricia Stanton  Procedure(s) Performed: Procedure(s): INCISIONAL HERNIORRHAPHY WITH MESH (N/A) INSERTION OF MESH  Patient Location: PACU  Anesthesia Type:General  Level of Consciousness: awake, alert  and patient cooperative  Airway & Oxygen Therapy: Patient Spontanous Breathing and non-rebreather face mask  Post-op Assessment: Report given to RN, Post -op Vital signs reviewed and stable and Patient moving all extremities  Post vital signs: Reviewed and stable    Last Pain:  Filed Vitals:   01/29/16 0839  PainSc: 9       Patients Stated Pain Goal: 9 ([id number])  Complications: No apparent anesthesia complications

## 2016-01-30 ENCOUNTER — Encounter (HOSPITAL_COMMUNITY): Payer: Self-pay | Admitting: General Surgery

## 2016-03-19 ENCOUNTER — Encounter (HOSPITAL_COMMUNITY): Payer: Self-pay | Admitting: Emergency Medicine

## 2016-03-19 ENCOUNTER — Emergency Department (HOSPITAL_COMMUNITY)
Admission: EM | Admit: 2016-03-19 | Discharge: 2016-03-19 | Disposition: A | Discharge status: Home / Self Care | Payer: Medicaid Other | Attending: Emergency Medicine | Admitting: Emergency Medicine

## 2016-03-19 DIAGNOSIS — B349 Viral infection, unspecified: Secondary | ICD-10-CM | POA: Diagnosis not present

## 2016-03-19 DIAGNOSIS — F17210 Nicotine dependence, cigarettes, uncomplicated: Secondary | ICD-10-CM | POA: Diagnosis not present

## 2016-03-19 DIAGNOSIS — J029 Acute pharyngitis, unspecified: Secondary | ICD-10-CM

## 2016-03-19 DIAGNOSIS — Z79899 Other long term (current) drug therapy: Secondary | ICD-10-CM | POA: Diagnosis not present

## 2016-03-19 MED ORDER — IBUPROFEN 400 MG PO TABS: 400.0000 mg | ORAL_TABLET | Freq: Once | ORAL | Status: DC

## 2016-03-19 MED ORDER — IBUPROFEN 400 MG PO TABS
600.0000 mg | ORAL_TABLET | Freq: Once | ORAL | Status: AC
  Administered 2016-03-19: 600 mg via ORAL
  Filled 2016-03-19: qty 2

## 2016-03-19 NOTE — ED Provider Notes (Signed)
CSN: [id number]     Arrival date & time 03/19/16  0715 History   First MD Initiated Contact with Patient 03/19/16 [phone]     Chief Complaint  Patient presents with  . Sore Throat     (Consider location/radiation/quality/duration/timing/severity/associated sxs/prior Treatment) HPI 30 year old female who presents with sore throat. She has a history of obesity, tobacco use, endometriosis. States that 2 days ago developed sore throat, nasal congestion and sinus pressure, bilateral ear otalgia, and generalized aches and pains. No known sick contacts. She was out with husband night prior and states she lost her voice initially. She has occasional non-productive cough. No fever or chills. NO n/v/d, abd pain, urinary complaints, chest pain or sob.  Past Medical History  Diagnosis Date  . Anxiety   . Obesity   . Tobacco abuse   . Ovarian cyst   . Endometriosis   . Chronic abdominal pain   . Chronic headaches   . Mood disorder [facility])    Past Surgical History  Procedure Laterality Date  . Femur fracture surgery Left 1995    tree fell on her  . Knee arthroscopy Left   . Tubal ligation    . Cesarean section  2008    [place]  . Cholecystectomy N/A 11/25/2014    Procedure: LAPAROSCOPIC CHOLECYSTECTOMY;  Surgeon: Aviva Signs Md, MD;  Location: [facility] ORS;  Service: General;  Laterality: N/A;  . Esophagogastroduodenoscopy N/A 12/21/2015    Procedure: ESOPHAGOGASTRODUODENOSCOPY (EGD);  Surgeon: Daneil Dolin, MD;  Location: [facility] ENDO SUITE;  Service: Endoscopy;  Laterality: N/A;  0830  . Incisional hernia repair N/A 01/29/2016    Procedure: Fatima Blank HERNIORRHAPHY WITH MESH;  Surgeon: Aviva Signs, MD;  Location: [facility] ORS;  Service: General;  Laterality: N/A;  . Insertion of mesh  01/29/2016    Procedure: INSERTION OF MESH;  Surgeon: Aviva Signs, MD;  Location: [facility] ORS;  Service: General;;  . Hernia repair     Family History  Problem Relation Age of Onset  . Hypertension Mother   . Hyperlipidemia Mother   .  Fibromyalgia Mother   . Other Mother     degenerative disc disease  . Diabetes Father   . Hypertension Father   . Hyperlipidemia Father   . Heart disease Father   . Heart attack Father   . Stroke Father   . Hyperlipidemia Brother   . Hypertension Brother   . Colon cancer Neg Hx   . Inflammatory bowel disease Neg Hx   . Aneurysm Maternal Grandmother     AAA  . Aneurysm Paternal Grandmother     AAA   Social History  Substance Use Topics  . Smoking status: Current Every Day Smoker -- 0.25 packs/day for 11 years    Types: Cigarettes  . Smokeless tobacco: Never Used  . Alcohol Use: No   OB History    No data available     Review of Systems 10/14 systems reviewed and are negative other than those stated in the HPI    Allergies  Review of patient's allergies indicates no known allergies.  Home Medications   Prior to Admission medications   Medication Sig Start Date End Date Taking? Authorizing Provider  divalproex (DEPAKOTE) 250 MG DR tablet Take 250 mg by mouth 3 (three) times daily. Reported on 12/19/2015    Historical Provider, MD  famotidine (PEPCID) 20 MG tablet Take 1 tablet (20 mg total) by mouth 2 (two) times daily. 12/09/15   Sherwood Gambler, MD  HYDROcodone-acetaminophen (NORCO) 5-325 MG  tablet Take 1-2 tablets by mouth every 4 (four) hours as needed for moderate pain. 01/29/16   Aviva Signs, MD  Linaclotide [facility]) 290 MCG CAPS capsule Take 1 capsule (290 mcg total) by mouth daily. 12/19/15   Mahala Menghini, PA-C  LORazepam (ATIVAN) 1 MG tablet Take 1 mg by mouth every 8 (eight) hours. Reported on 12/19/2015    Historical Provider, MD  ondansetron (ZOFRAN ODT) 4 MG disintegrating tablet Take 1 tablet (4 mg total) by mouth every 8 (eight) hours as needed for nausea or vomiting. 12/09/15   Sherwood Gambler, MD   BP 137/93 mmHg  Pulse 118  Temp(Src) 98.1 F (36.7 C) (Oral)  Resp 18  Ht 5\' 5"  (1.651 m)  Wt 250 lb (113.399 kg)  BMI 41.60 kg/m2  SpO2 96% Physical  Exam Physical Exam  Nursing note and vitals reviewed. Constitutional: Well developed, well nourished, non-toxic, and in no acute distress Head: Normocephalic and atraumatic.  Ears: TM normal bilaterally Mouth/Throat: Oropharynx is erythematous, but no swelling or lesions and moist.  Neck: Normal range of motion. Neck supple. Mild left cervical adenopathy Cardiovascular: Normal rate and regular rhythm.   Pulmonary/Chest: Effort normal and breath sounds normal.  Abdominal: Soft. There is no tenderness. There is no rebound and no guarding.  Musculoskeletal: Normal range of motion.  Neurological: Alert, no facial droop, fluent speech, moves all extremities symmetrically Skin: Skin is warm and dry.  Psychiatric: Cooperative  ED Course  Procedures (including critical care time) Labs Review Labs Reviewed  RAPID STREP SCREEN (NOT AT [facility])    Imaging Review No results found. I have personally reviewed and evaluated these images and lab results as part of my medical decision-making.   EKG Interpretation None      MDM   Final diagnoses:  Sore throat  Viral illness    Seems like a viral pharyngitis. Lungs clear. With mild cervical adenopathy and posterior oropharynx erythematous. No lesions/exudates or swelling. Patient does not want strep testing. Will continue supportive care for home. Strict return and follow-up instructions reviewed. She expressed understanding of all discharge instructions and felt comfortable with the plan of care.     Forde Dandy, MD 03/19/16 [phone]

## 2016-03-19 NOTE — ED Notes (Signed)
Pt reports sore throat, bilateral ear pain, bodyaches, and congestion since Sunday.  PT airway clear.

## 2016-03-19 NOTE — Discharge Instructions (Signed)
Return for worsening symptoms, including persistent fevers, difficulty breathing, escalating pain, or any other symptoms concerning to you.  Take motrin and tylenol as needed for pain. Drink plenty of fluids.   Pharyngitis Pharyngitis is a sore throat (pharynx). There is redness, pain, and swelling of your throat. HOME CARE   Drink enough fluids to keep your pee (urine) clear or pale yellow.  Only take medicine as told by your doctor.  You may get sick again if you do not take medicine as told. Finish your medicines, even if you start to feel better.  Do not take aspirin.  Rest.  Rinse your mouth (gargle) with salt water ( tsp of salt per 1 qt of water) every 1-2 hours. This will help the pain.  If you are not at risk for choking, you can suck on hard candy or sore throat lozenges. GET HELP IF:  You have large, tender lumps on your neck.  You have a rash.  You cough up green, yellow-brown, or bloody spit. GET HELP RIGHT AWAY IF:   You have a stiff neck.  You drool or cannot swallow liquids.  You throw up (vomit) or are not able to keep medicine or liquids down.  You have very bad pain that does not go away with medicine.  You have problems breathing (not from a stuffy nose). MAKE SURE YOU:   Understand these instructions.  Will watch your condition.  Will get help right away if you are not doing well or get worse.   This information is not intended to replace advice given to you by your health care provider. Make sure you discuss any questions you have with your health care provider.   Document Released: 02/19/2008 Document Revised: 06/23/2013 Document Reviewed: 05/10/2013 Elsevier Interactive Patient Education [employer].

## 2016-03-19 NOTE — ED Notes (Signed)
Pt states she does not feel she needs strep test at this time.  MD aware and ok to D/C order.

## 2016-04-08 ENCOUNTER — Emergency Department (HOSPITAL_COMMUNITY)
Admission: EM | Admit: 2016-04-08 | Discharge: 2016-04-08 | Disposition: A | Discharge status: Home / Self Care | Payer: Medicaid Other | Attending: Emergency Medicine | Admitting: Emergency Medicine

## 2016-04-08 ENCOUNTER — Encounter (HOSPITAL_COMMUNITY): Payer: Self-pay | Admitting: Emergency Medicine

## 2016-04-08 DIAGNOSIS — J039 Acute tonsillitis, unspecified: Secondary | ICD-10-CM | POA: Insufficient documentation

## 2016-04-08 DIAGNOSIS — F17210 Nicotine dependence, cigarettes, uncomplicated: Secondary | ICD-10-CM | POA: Diagnosis not present

## 2016-04-08 DIAGNOSIS — J029 Acute pharyngitis, unspecified: Secondary | ICD-10-CM | POA: Diagnosis present

## 2016-04-08 MED ORDER — PENICILLIN G BENZATHINE 1200000 UNIT/2ML IM SUSP
1.2000 10*6.[IU] | Freq: Once | INTRAMUSCULAR | Status: AC
  Administered 2016-04-08: 1.2 10*6.[IU] via INTRAMUSCULAR
  Filled 2016-04-08: qty 2

## 2016-04-08 MED ORDER — GUAIFENESIN-CODEINE 100-10 MG/5ML PO SYRP: 10.0000 mL | ORAL_SOLUTION | Freq: Three times a day (TID) | ORAL | 0 refills | Status: DC | PRN

## 2016-04-08 MED ORDER — PREDNISONE 10 MG PO TABS: ORAL_TABLET | ORAL | 0 refills | Status: DC

## 2016-04-08 NOTE — Discharge Instructions (Signed)
Soft foods and plenty of fluids.  Follow-up with your doctor for recheck

## 2016-04-08 NOTE — ED Triage Notes (Addendum)
Pt reports sore throat, left ear pain x2 weeks. Pt reports was seen for same and diagnosed with pharyngitis. Pt reports was seen at pcp and was diagnosed with tonsilitis and "uvalitis". Pt PCP tested pt for strep with negative result. Pt reports was prescribed a z-pack. Pt finished z-pack on 04/01/16. Pt reports continued pain. nad noted. Airway patent.

## 2016-04-08 NOTE — ED Provider Notes (Signed)
[facility] DEPT Provider Note   CSN: [id number] Arrival date & time: 04/08/16  [id number]  First Provider Contact:  First MD Initiated Contact with Patient 04/08/16 1118      By signing my name below, I, [facility], attest that this documentation has been prepared under the direction and in the presence of Bhavana Kady, PA-C. Electronically Signed: Virgel Bouquet, ED Scribe. 04/08/16. 11:30 AM.   History   Chief Complaint Chief Complaint  Patient presents with  . Sore Throat    HPI Patricia Stanton is a 30 y.o. female who presents to the Emergency Department complaining of constant, moderate, unchanging sore throat ongoing for the past month. Pt states that her throat has been swollen and painful for the past month. She notes that she has been seen in the [facility] ED where she was diagnosed with pharyngitis and followed-up with her PCP who diagnosed her with tonsillitis and prescribed azithromycin. She reports associated left ear pain, cough, and a feverish feeling in her face. Sore throat is worse at night. She has finished the course of azithromycin with temporary relief only. Denies hx of DM. Denies fever, vomiting, neck pain or shortness of breath.  The history is provided by the patient. No language interpreter was used.    Past Medical History:  Diagnosis Date  . Anxiety   . Chronic abdominal pain   . Chronic headaches   . Endometriosis   . Mood disorder ([place])   . Obesity   . Ovarian cyst   . Tobacco abuse     Patient Active Problem List   Diagnosis Date Noted  . Mucosal abnormality of stomach   . Abdominal pain, epigastric 12/19/2015  . Nausea with vomiting 12/19/2015  . Constipation 12/19/2015  . Anxiety as acute reaction to gross stress 06/27/2014  . Acute bronchitis 06/06/2013  . Community acquired pneumonia 06/06/2013  . Tobacco abuse 06/06/2013  . Morbid obesity ([place]) 06/06/2013  . Acute respiratory failure with hypoxia ([place]) 06/06/2013  .  Lactic acidosis 06/06/2013  . Hyperglycemia, drug-induced 06/06/2013    Past Surgical History:  Procedure Laterality Date  . CESAREAN SECTION  2008   [place]  . CHOLECYSTECTOMY N/A 11/25/2014   Procedure: LAPAROSCOPIC CHOLECYSTECTOMY;  Surgeon: Aviva Signs Md, MD;  Location: [facility] ORS;  Service: General;  Laterality: N/A;  . ESOPHAGOGASTRODUODENOSCOPY N/A 12/21/2015   Procedure: ESOPHAGOGASTRODUODENOSCOPY (EGD);  Surgeon: Daneil Dolin, MD;  Location: [facility] END[facility];  Service: Endoscopy;  Laterality: N/A;  0830  . FEMUR FRACTURE SURGERY Left 1995   tree fell on her  . HERNIA REPAIR    . INCISIONAL HERNIA REPAIR N/A 01/29/2016   Procedure: Fatima Blank HERNIORRHAPHY WITH MESH;  Surgeon: Aviva Signs, MD;  Location: [facility] ORS;  Service: General;  Laterality: N/A;  . INSERTION OF MESH  01/29/2016   Procedure: INSERTION OF MESH;  Surgeon: Aviva Signs, MD;  Location: [facility] ORS;  Service: General;;  . KNEE ARTHROSCOPY Left   . TUBAL LIGATION      OB History    No data available       Home Medications    Prior to Admission medications   Medication Sig Start Date End Date Taking? Authorizing Provider  divalproex (DEPAKOTE ER) 250 MG 24 hr tablet Take 750 mg by mouth daily. 02/19/16   Historical Provider, MD  famotidine (PEPCID) 20 MG tablet Take 1 tablet (20 mg total) by mouth 2 (two) times daily. Patient not taking: Reported on 04/08/2016 12/09/15   Sherwood Gambler, MD  HYDROcodone-acetaminophen (  NORCO) 5-325 MG tablet Take 1-2 tablets by mouth every 4 (four) hours as needed for moderate pain. Patient not taking: Reported on 04/08/2016 01/29/16   Aviva Signs, MD  Linaclotide [facility]) 290 MCG CAPS capsule Take 1 capsule (290 mcg total) by mouth daily. Patient not taking: Reported on 04/08/2016 12/19/15   Mahala Menghini, PA-C  LORazepam (ATIVAN) 1 MG tablet Take 1 mg by mouth every 8 (eight) hours. Reported on 12/19/2015    Historical Provider, MD  ondansetron (ZOFRAN ODT) 4 MG disintegrating tablet Take 1  tablet (4 mg total) by mouth every 8 (eight) hours as needed for nausea or vomiting. Patient not taking: Reported on 04/08/2016 12/09/15   Sherwood Gambler, MD  prazosin (MINIPRESS) 1 MG capsule Take 1-2 mg by mouth at bedtime. 02/16/16   Historical Provider, MD    Family History Family History  Problem Relation Age of Onset  . Hypertension Mother   . Hyperlipidemia Mother   . Fibromyalgia Mother   . Other Mother     degenerative disc disease  . Diabetes Father   . Hypertension Father   . Hyperlipidemia Father   . Heart disease Father   . Heart attack Father   . Stroke Father   . Hyperlipidemia Brother   . Hypertension Brother   . Aneurysm Maternal Grandmother     AAA  . Aneurysm Paternal Grandmother     AAA  . Colon cancer Neg Hx   . Inflammatory bowel disease Neg Hx     Social History Social History  Substance Use Topics  . Smoking status: Current Every Day Smoker    Packs/day: 0.25    Years: 11.00    Types: Cigarettes  . Smokeless tobacco: Never Used  . Alcohol use No     Allergies   Review of patient's allergies indicates no known allergies.   Review of Systems Review of Systems  Constitutional: Negative for fever.  HENT: Positive for ear pain and sore throat.   Respiratory: Positive for cough.   All other systems reviewed and are negative.    Physical Exam Updated Vital Signs BP 151/92 (BP Location: Left Arm)   Pulse 95   Temp 98.4 F (36.9 C) (Oral)   Resp 18   Ht 5\' 5"  (1.651 m)   Wt (!) 330 lb (149.7 kg)   LMP 03/29/2016   SpO2 97%   BMI 54.91 kg/m   Physical Exam  Constitutional: She is oriented to person, place, and time. She appears well-developed and well-nourished. No distress.  HENT:  Head: Normocephalic and atraumatic.  Edema and erythema of the oropharynx. No exudate. Uvula midline and non-edematous.  Eyes: Conjunctivae are normal.  Neck: Normal range of motion.  Cardiovascular: Normal rate.   Pulmonary/Chest: Effort normal. No  respiratory distress.  Musculoskeletal: Normal range of motion.  Neurological: She is alert and oriented to person, place, and time.  Skin: Skin is warm and dry.  Psychiatric: She has a normal mood and affect. Her behavior is normal.  Nursing note and vitals reviewed.    ED Treatments / Results   DIAGNOSTIC STUDIES: Oxygen Saturation is 97% on RA, normal by my interpretation.    COORDINATION OF CARE: 11:21 AM Will order IM penicillin. Will prescribe prednisone and Tessalon. Will discharge pt.  Discussed treatment plan with pt at bedside and pt agreed to plan.   Procedures Procedures  Medications Ordered in ED Medications - No data to display   Initial Impression / Assessment and Plan / ED  Course  I have reviewed the triage vital signs and the nursing notes.  Pertinent labs & imaging results that were available during my care of the patient were reviewed by me and considered in my medical decision making (see chart for details).  Clinical Course    Pt non-toxic appearing.  Vitals stable airway patent.    IM Bicillin given.  Pt agrees to PMD f/u if needed.  Final Clinical Impressions(s) / ED Diagnoses   Final diagnoses:  Tonsillitis    New Prescriptions Discharge Medication List as of 04/08/2016 12:00 PM    START taking these medications   Details  guaiFENesin-codeine (ROBITUSSIN AC) 100-10 MG/5ML syrup Take 10 mLs by mouth 3 (three) times daily as needed., Starting Mon 04/08/2016, Print    predniSONE (DELTASONE) 10 MG tablet Take 6 tablets day one, 5 tablets day two, 4 tablets day three, 3 tablets day four, 2 tablets day five, then 1 tablet day six, Print       I personally performed the services described in this documentation, which was scribed in my presence. The recorded information has been reviewed and is accurate.     Kem Parkinson, PA-C 04/09/16 2254    Milton Ferguson, MD 04/10/16 Tresa Moore

## 2016-11-19 ENCOUNTER — Ambulatory Visit (INDEPENDENT_AMBULATORY_CARE_PROVIDER_SITE_OTHER): Payer: Medicaid Other | Admitting: Family Medicine

## 2016-11-19 ENCOUNTER — Other Ambulatory Visit: Payer: Self-pay

## 2016-11-19 ENCOUNTER — Encounter: Payer: Self-pay | Admitting: Family Medicine

## 2016-11-19 DIAGNOSIS — F39 Unspecified mood [affective] disorder: Secondary | ICD-10-CM | POA: Insufficient documentation

## 2016-11-19 DIAGNOSIS — R911 Solitary pulmonary nodule: Secondary | ICD-10-CM | POA: Diagnosis not present

## 2016-11-19 DIAGNOSIS — K3189 Other diseases of stomach and duodenum: Secondary | ICD-10-CM | POA: Diagnosis not present

## 2016-11-19 DIAGNOSIS — G43809 Other migraine, not intractable, without status migrainosus: Secondary | ICD-10-CM | POA: Diagnosis not present

## 2016-11-19 DIAGNOSIS — F4310 Post-traumatic stress disorder, unspecified: Secondary | ICD-10-CM | POA: Insufficient documentation

## 2016-11-19 DIAGNOSIS — Z8719 Personal history of other diseases of the digestive system: Secondary | ICD-10-CM | POA: Insufficient documentation

## 2016-11-19 DIAGNOSIS — R030 Elevated blood-pressure reading, without diagnosis of hypertension: Secondary | ICD-10-CM | POA: Insufficient documentation

## 2016-11-19 DIAGNOSIS — Z9889 Other specified postprocedural states: Secondary | ICD-10-CM

## 2016-11-19 LAB — LIPID PANEL
Cholesterol: 195 mg/dL (ref <200)
HDL: 40 mg/dL — ABNORMAL LOW (ref >50)
LDL Cholesterol: 121 mg/dL — ABNORMAL HIGH (ref <100)
TRIGLYCERIDES: 168 mg/dL — AB (ref <150)
Total CHOL/HDL Ratio: 4.9 Ratio (ref <5.0)
VLDL: 34 mg/dL — ABNORMAL HIGH (ref <30)

## 2016-11-19 LAB — URINALYSIS, ROUTINE W REFLEX MICROSCOPIC
Bilirubin Urine: NEGATIVE
Glucose, UA: NEGATIVE
Hgb urine dipstick: NEGATIVE
Ketones, ur: NEGATIVE
LEUKOCYTES UA: NEGATIVE
Nitrite: NEGATIVE
PH: 7 (ref 5.0–8.0)
PROTEIN: NEGATIVE
SPECIFIC GRAVITY, URINE: 1.02 (ref 1.001–1.035)

## 2016-11-19 LAB — CBC
HCT: 37.7 % (ref 35.0–45.0)
HEMOGLOBIN: 12.2 g/dL (ref 11.7–15.5)
MCH: 24.8 pg — ABNORMAL LOW (ref 27.0–33.0)
MCHC: 32.4 g/dL (ref 32.0–36.0)
MCV: 76.6 fL — AB (ref 80.0–100.0)
MPV: 9.7 fL (ref 7.5–12.5)
PLATELETS: 271 10*3/uL (ref 140–400)
RBC: 4.92 MIL/uL (ref 3.80–5.10)
RDW: 16.9 % — ABNORMAL HIGH (ref 11.0–15.0)
WBC: 15.3 10*3/uL — ABNORMAL HIGH (ref 3.8–10.8)

## 2016-11-19 LAB — COMPREHENSIVE METABOLIC PANEL
ALBUMIN: 3.9 g/dL (ref 3.6–5.1)
ALT: 26 U/L (ref 6–29)
AST: 18 U/L (ref 10–30)
Alkaline Phosphatase: 91 U/L (ref 33–115)
BUN: 9 mg/dL (ref 7–25)
CHLORIDE: 103 mmol/L (ref 98–110)
CO2: 24 mmol/L (ref 20–31)
Calcium: 8.8 mg/dL (ref 8.6–10.2)
Creat: 0.55 mg/dL (ref 0.50–1.10)
Glucose, Bld: 127 mg/dL — ABNORMAL HIGH (ref 65–99)
POTASSIUM: 4.2 mmol/L (ref 3.5–5.3)
Sodium: 137 mmol/L (ref 135–146)
TOTAL PROTEIN: 6.4 g/dL (ref 6.1–8.1)
Total Bilirubin: 0.4 mg/dL (ref 0.2–1.2)

## 2016-11-19 LAB — TSH: TSH: 1.38 mIU/L

## 2016-11-19 NOTE — Patient Instructions (Signed)
Keep track of daily blood pressure and keep a log Walk every day that you are able Need updated labs I will send you a letter with your test results.  If there is anything of concern, we will call right away.  I have placed a referral to psychiatry  See me in a month Call sooner for problems

## 2016-11-19 NOTE — Progress Notes (Signed)
Chief Complaint  Patient presents with  . Establish Care   New to establish Old records requested Is here with Husband Herbie [place] Needs primary care and referral to psychiatry  She had depression and anxiety and PTSD and "mood disorder" as her describes psychiatric diagnoses.  Is under care of psych in [place]. Has enough medicine to last until she can see new provider.  Requests a provider in [place].  She had a cholecystectomy in 2016.  Developed upper abdominal pain post op and was found to have a hernia 3 cm upper abdominal wall at sop site.  She had a hernia repair in spring of 2017.  States her surgeon told her not to work any more.  Is not having pain.  Does not feel able to do heavy lifting.  Worked as a Quarry manager.  Boasts that she has "6 medical degrees", I am uncertain what these certifications are, but when I presented to her that her education made her employable even if she should not lift, she was not open to this suggestion of job retraining or finding alternate employment.  I have discussed the multiple health risks associated with cigarette smoking including, but not limited to, cardiovascular disease, lung disease and cancer.  I have strongly recommended that smoking be stopped.  I have reviewed the various methods of quitting including cold [place], classes, nicotine replacements and prescription medications.  I have offered assistance in this difficult process.  The patient is not interested in assistance at this time.   Patient Active Problem List   Diagnosis Date Noted  . Mood disorder ([place]) 11/19/2016  . PTSD (post-traumatic stress disorder) 11/19/2016  . Elevated blood-pressure reading without diagnosis of hypertension 11/19/2016  . History of hernia surgery 11/19/2016  . Migraine headache 11/19/2016  . Pulmonary nodule/lesion, solitary 11/19/2016  . Mucosal abnormality of stomach   . Tobacco abuse 06/06/2013  . Morbid obesity ([place]) 06/06/2013    Outpatient  Encounter Prescriptions as of 11/19/2016  Medication Sig  . divalproex (DEPAKOTE ER) 250 MG 24 hr tablet Take 250 mg by mouth 3 (three) times daily.   Marland Kitchen LORazepam (ATIVAN) 1 MG tablet Take 1 mg by mouth every 8 (eight) hours. Reported on 12/19/2015  . prazosin (MINIPRESS) 1 MG capsule Take 1-2 mg by mouth at bedtime.   No facility-administered encounter medications on file as of 11/19/2016.     Past Medical History:  Diagnosis Date  . Anxiety   . Arthritis    left knee  . Asthma   . Chronic abdominal pain   . Chronic headaches   . Depression   . Endometriosis   . Hypertension   . Mood disorder ([place])   . Obesity   . Ovarian cyst   . Tobacco abuse     Past Surgical History:  Procedure Laterality Date  . CESAREAN SECTION  2008   [place]  . CHOLECYSTECTOMY N/A 11/25/2014   Procedure: LAPAROSCOPIC CHOLECYSTECTOMY;  Surgeon: Aviva Signs Md, MD;  Location: [facility];  Service: General;  Laterality: N/A;  . ESOPHAGOGASTRODUODENOSCOPY N/A 12/21/2015   Procedure: ESOPHAGOGASTRODUODENOSCOPY (EGD);  Surgeon: Daneil Dolin, MD;  Location: [facility];  Service: Endoscopy;  Laterality: N/A;  0830  . FEMUR FRACTURE SURGERY Left 1995   tree fell on her  . HERNIA REPAIR    . INCISIONAL HERNIA REPAIR N/A 01/29/2016   Procedure: Fatima Blank HERNIORRHAPHY WITH MESH;  Surgeon: Aviva Signs, MD;  Location: [facility];  Service: General;  Laterality: N/A;  . INSERTION OF  MESH  01/29/2016   Procedure: INSERTION OF MESH;  Surgeon: Aviva Signs, MD;  Location: [facility];  Service: General;;  . KNEE ARTHROSCOPY Left   . TUBAL LIGATION      Social History   Social History  . Marital status: Married    Spouse name: Herbie [place]  . Number of children: 1  . Years of education: 76   Occupational History  . CNA     disability pending  .      hernia surgery   Social History Main Topics  . Smoking status: Current Every Day Smoker    Packs/day: 0.25    Years: 11.00    Types: Cigarettes    Start date: 09/17/2003  .  Smokeless tobacco: Never Used  . Alcohol use No  . Drug use: No  . Sexual activity: Yes    Birth control/ protection: Surgical     Comment: BTL   Other Topics Concern  . Not on file   Social History Narrative   Disabled from nursing   Lives at home with husband and daughter [name]       Family History  Problem Relation Age of Onset  . Hypertension Mother   . Hyperlipidemia Mother   . Fibromyalgia Mother   . Other Mother     degenerative disc disease  . Arthritis Mother   . Diabetes Father   . Hypertension Father   . Hyperlipidemia Father   . Heart disease Father 46  . Heart attack Father   . Stroke Father   . Hyperlipidemia Brother   . Hypertension Brother   . Aneurysm Maternal Grandmother     AAA  . Aneurysm Paternal Grandmother     AAA  . Colon cancer Neg Hx   . Inflammatory bowel disease Neg Hx     Review of Systems  Constitutional: Negative for chills, fever and weight loss.  HENT: Negative for congestion and hearing loss.   Eyes: Negative for blurred vision and pain.  Respiratory: Negative for cough and shortness of breath.   Cardiovascular: Negative for chest pain and leg swelling.  Gastrointestinal: Negative for abdominal pain, constipation, diarrhea and heartburn.  Genitourinary: Negative for dysuria and frequency.  Musculoskeletal: Positive for back pain. Negative for falls, joint pain and myalgias.       Occasional  Neurological: Negative for dizziness, seizures and headaches.  Psychiatric/Behavioral: Positive for depression. The patient is nervous/anxious. The patient does not have insomnia.        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  Super obese  HENT:  Head: Normocephalic and atraumatic.  Mouth/Throat: Oropharynx is clear and moist.  Eyes: Conjunctivae are normal. Pupils are equal, round, and reactive to light.  Neck: Normal range of motion.  Cardiovascular: Normal rate, regular rhythm and normal heart sounds.   Pulmonary/Chest: Effort normal and breath sounds normal.  Abdominal: Soft. Bowel sounds are normal. She exhibits no distension. There is no tenderness.  Well healer upper abdominal scar without tenderness  Musculoskeletal: Normal range of motion. She exhibits no  edema.  Left knee fully mobile  Lymphadenopathy:    She has no cervical adenopathy.  Neurological: She is alert and oriented to person, place, and time.  Psychiatric: Her behavior is normal.  Depressed mood   ASSESSMENT/PLAN:  1. Mood disorder (HCC)  - CBC - Comprehensive metabolic panel - Hemoglobin A1c - Lipid panel - VITAMIN D 25 Hydroxy (Vit-D Deficiency, Fractures) - Urinalysis, Routine w reflex microscopic - Ambulatory referral to Psychiatry - TSH  2. PTSD (post-traumatic stress disorder) *  3. Elevated blood-pressure reading without diagnosis of hypertension *  4. History of hernia surgery *  5. Other migraine without status migrainosus, not intractable *  6. Mucosal abnormality of stomach   7. Pulmonary nodule/lesion, solitary **   Patient Instructions  Keep track of daily blood pressure and keep a log Walk every day that you are able Need updated labs I will send you a letter with your test results.  If there is anything of concern, we will call right away.  I have placed a referral to psychiatry  See me in a month Call sooner for problems   Raylene Everts, MD

## 2016-11-20 ENCOUNTER — Other Ambulatory Visit: Payer: Self-pay | Admitting: Family Medicine

## 2016-11-20 ENCOUNTER — Encounter: Payer: Self-pay | Admitting: Family Medicine

## 2016-11-20 DIAGNOSIS — E669 Obesity, unspecified: Secondary | ICD-10-CM

## 2016-11-20 DIAGNOSIS — E1169 Type 2 diabetes mellitus with other specified complication: Secondary | ICD-10-CM

## 2016-11-20 DIAGNOSIS — E559 Vitamin D deficiency, unspecified: Secondary | ICD-10-CM

## 2016-11-20 HISTORY — DX: Type 2 diabetes mellitus with other specified complication: E66.9

## 2016-11-20 HISTORY — DX: Type 2 diabetes mellitus with other specified complication: E11.69

## 2016-11-20 LAB — HEMOGLOBIN A1C
HEMOGLOBIN A1C: 7.5 % — AB (ref <5.7)
MEAN PLASMA GLUCOSE: 169 mg/dL

## 2016-11-20 LAB — VITAMIN D 25 HYDROXY (VIT D DEFICIENCY, FRACTURES): Vit D, 25-Hydroxy: 11 ng/mL — ABNORMAL LOW (ref 30–100)

## 2016-11-20 MED ORDER — VITAMIN D (ERGOCALCIFEROL) 1.25 MG (50000 UNIT) PO CAPS: 50000.0000 [IU] | ORAL_CAPSULE | ORAL | 0 refills | Status: DC

## 2016-12-03 ENCOUNTER — Telehealth: Payer: Self-pay | Admitting: Family Medicine

## 2016-12-03 NOTE — Telephone Encounter (Signed)
Called patient to r/s appt for good Friday
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
## 2016-12-09 ENCOUNTER — Ambulatory Visit (INDEPENDENT_AMBULATORY_CARE_PROVIDER_SITE_OTHER): Payer: Medicaid Other | Admitting: Psychiatry

## 2016-12-09 ENCOUNTER — Encounter (HOSPITAL_COMMUNITY): Payer: Self-pay | Admitting: Psychiatry

## 2016-12-09 VITALS — BP 146/94 | HR 74 | Ht 65.0 in | Wt 332.8 lb

## 2016-12-09 DIAGNOSIS — Z813 Family history of other psychoactive substance abuse and dependence: Secondary | ICD-10-CM

## 2016-12-09 DIAGNOSIS — F17210 Nicotine dependence, cigarettes, uncomplicated: Secondary | ICD-10-CM

## 2016-12-09 DIAGNOSIS — F419 Anxiety disorder, unspecified: Secondary | ICD-10-CM

## 2016-12-09 DIAGNOSIS — Z79899 Other long term (current) drug therapy: Secondary | ICD-10-CM | POA: Diagnosis not present

## 2016-12-09 DIAGNOSIS — F4310 Post-traumatic stress disorder, unspecified: Secondary | ICD-10-CM

## 2016-12-09 DIAGNOSIS — Z818 Family history of other mental and behavioral disorders: Secondary | ICD-10-CM | POA: Diagnosis not present

## 2016-12-09 DIAGNOSIS — G894 Chronic pain syndrome: Secondary | ICD-10-CM

## 2016-12-09 DIAGNOSIS — F332 Major depressive disorder, recurrent severe without psychotic features: Secondary | ICD-10-CM

## 2016-12-09 MED ORDER — LORAZEPAM 1 MG PO TABS: 1.0000 mg | ORAL_TABLET | Freq: Two times a day (BID) | ORAL | 0 refills | Status: DC | PRN

## 2016-12-09 MED ORDER — PRAZOSIN HCL 1 MG PO CAPS: 1.0000 mg | ORAL_CAPSULE | Freq: Every day | ORAL | 0 refills | Status: DC

## 2016-12-09 MED ORDER — DULOXETINE HCL 30 MG PO CPEP: ORAL_CAPSULE | ORAL | 0 refills | Status: DC

## 2016-12-09 NOTE — Patient Instructions (Addendum)
1. Start duloxetine 30 mg daily for two weeks, then 60 mg daily 2. Start Ativan 1 mg twice a day as needed for anxiety 3. Start Prazosin 1 mg at night 4. Return to clinic in one month for 30 mins  5. Contact for therapy: Dr. Alford Highland Schneidmiller  [phone] [address], [place], [place] [address]

## 2016-12-19 ENCOUNTER — Ambulatory Visit: Payer: Self-pay | Admitting: Family Medicine

## 2016-12-20 ENCOUNTER — Telehealth: Payer: Self-pay | Admitting: Internal Medicine

## 2016-12-20 ENCOUNTER — Ambulatory Visit: Payer: Self-pay | Admitting: Family Medicine

## 2016-12-20 NOTE — Telephone Encounter (Signed)
RECALL FOR CT  °

## 2016-12-20 NOTE — Telephone Encounter (Signed)
Letter mailed

## 2016-12-23 ENCOUNTER — Telehealth: Payer: Self-pay | Admitting: Nutrition

## 2016-12-23 NOTE — Telephone Encounter (Signed)
vm to call to reschedule appt.

## 2016-12-24 ENCOUNTER — Ambulatory Visit (INDEPENDENT_AMBULATORY_CARE_PROVIDER_SITE_OTHER): Payer: Medicaid Other | Admitting: Family Medicine

## 2016-12-24 ENCOUNTER — Encounter: Payer: Self-pay | Admitting: Family Medicine

## 2016-12-24 ENCOUNTER — Telehealth: Payer: Self-pay | Admitting: Family Medicine

## 2016-12-24 ENCOUNTER — Ambulatory Visit: Payer: Self-pay | Admitting: Nutrition

## 2016-12-24 VITALS — BP 144/78 | HR 100 | Temp 97.9°F | Resp 20 | Ht 65.0 in | Wt 331.1 lb

## 2016-12-24 DIAGNOSIS — E282 Polycystic ovarian syndrome: Secondary | ICD-10-CM

## 2016-12-24 DIAGNOSIS — R911 Solitary pulmonary nodule: Secondary | ICD-10-CM

## 2016-12-24 DIAGNOSIS — IMO0001 Reserved for inherently not codable concepts without codable children: Secondary | ICD-10-CM

## 2016-12-24 DIAGNOSIS — E559 Vitamin D deficiency, unspecified: Secondary | ICD-10-CM

## 2016-12-24 DIAGNOSIS — E669 Obesity, unspecified: Secondary | ICD-10-CM

## 2016-12-24 DIAGNOSIS — R030 Elevated blood-pressure reading, without diagnosis of hypertension: Secondary | ICD-10-CM | POA: Diagnosis not present

## 2016-12-24 DIAGNOSIS — E1169 Type 2 diabetes mellitus with other specified complication: Secondary | ICD-10-CM

## 2016-12-24 LAB — LUTEINIZING HORMONE: LH: 4 m[IU]/mL

## 2016-12-24 LAB — TESTOSTERONE: Testosterone: 24 ng/dL

## 2016-12-24 LAB — FOLLICLE STIMULATING HORMONE: FSH: 6 m[IU]/mL

## 2016-12-24 MED ORDER — VITAMIN D (ERGOCALCIFEROL) 1.25 MG (50000 UNIT) PO CAPS: 50000.0000 [IU] | ORAL_CAPSULE | ORAL | 0 refills | Status: DC

## 2016-12-24 NOTE — Telephone Encounter (Signed)
Patient would like to know if fasting is required for June lab work.  Please call and advise

## 2016-12-24 NOTE — Progress Notes (Signed)
Chief Complaint  Patient presents with  . Follow-up    1 month   Patient is here for follow-up. She has been trying to reduce her portions and change her diet to limit sweets and carbs. She has lost 10 pounds. She is Advertising account executive. We discussed that her hemoglobin A1c is 7.5 which indicates diabetes. She is determined not to take diabetes medication. She wishes to try diet and exercise for 3 months and recheck her hemoglobin A1c. She agrees to go on metformin if she fails to improve. We discussed that her lipids are high. If she is diabetic she definitely needs to go on a statin. She does not want to take cholesterol medication. Again she is given the opportunity to diet and exercise. The patient is morbidly obese. She has lost 10 pounds. We discussed the importance of weight loss,. We discussed her plan, and the importance of regular daily exercise. The patient's blood pressure is still above normal. It is improved slightly. Again, she would like not to take blood pressure medication. We'll see how she does after another 2 months of home lifestyle modification. She has no new complaints. In Review of her old medical record I found that she had pulmonary nodules identified in April 2017. It was recommended that her chest CT be repeated today when you. If time. This is ordered today. We discussed vitamin D deficiency. Dietary sources of vitamin D. Vitamin D supplementation. Sunshine. The patient has a diagnosis of PCO S. This worries her. She has done some reading. She wished to know what her "hormone levels are. I told her that I will draw these for her, but recommend she follow-up with her gynecologist  Patient Active Problem List   Diagnosis Date Noted  . Vitamin D deficiency 11/20/2016  . Diabetes mellitus type 2 in obese ([place]) 11/20/2016  . PTSD (post-traumatic stress disorder) 11/19/2016  . Elevated blood-pressure reading without diagnosis of hypertension 11/19/2016  . History of  hernia surgery 11/19/2016  . Migraine headache 11/19/2016  . Pulmonary nodule/lesion, solitary 11/19/2016  . Mucosal abnormality of stomach   . Tobacco abuse 06/06/2013    Outpatient Encounter Prescriptions as of 12/24/2016  Medication Sig  . DULoxetine (CYMBALTA) 30 MG capsule Take 30 mg daily for two weeks, then increase to 60 mg daily  . LORazepam (ATIVAN) 1 MG tablet Take 1 tablet (1 mg total) by mouth 2 (two) times daily as needed for anxiety.  . prazosin (MINIPRESS) 1 MG capsule Take 1 capsule (1 mg total) by mouth at bedtime.  . Vitamin D, Ergocalciferol, (DRISDOL) 50000 units CAPS capsule Take 1 capsule (50,000 Units total) by mouth every 7 (seven) days.   No facility-administered encounter medications on file as of 12/24/2016.     No Known Allergies  Review of Systems  Constitutional: Negative for activity change, appetite change and unexpected weight change.  HENT: Negative for congestion, dental problem, postnasal drip and rhinorrhea.   Eyes: Negative for redness and visual disturbance.  Respiratory: Negative for cough and shortness of breath.   Cardiovascular: Negative for chest pain, palpitations and leg swelling.  Gastrointestinal: Negative for abdominal pain, constipation and diarrhea.  Genitourinary: Positive for vaginal bleeding. Negative for difficulty urinating, frequency and menstrual problem.       Menorrhagia, irregular bleeding  Musculoskeletal: Negative for arthralgias and back pain.  Neurological: Negative for dizziness and headaches.  Psychiatric/Behavioral: Negative for dysphoric mood and sleep disturbance. The patient is not nervous/anxious.      Results for  Patricia Stanton, Patricia Stanton (MRN [id number]) as of 12/24/2016 12:27  Ref. Range 11/19/2016 09:44  Sodium Latest Ref Range: 135 - 146 mmol/L 137  Potassium Latest Ref Range: 3.5 - 5.3 mmol/L 4.2  Chloride Latest Ref Range: 98 - 110 mmol/L 103  CO2 Latest Ref Range: 20 - 31 mmol/L 24  Glucose Latest Ref Range: 65 -  99 mg/dL 127 (H)  Mean Plasma Glucose Latest Units: mg/dL 169  BUN Latest Ref Range: 7 - 25 mg/dL 9  Creatinine Latest Ref Range: 0.50 - 1.10 mg/dL 0.55  Calcium Latest Ref Range: 8.6 - 10.2 mg/dL 8.8  Alkaline Phosphatase Latest Ref Range: 33 - 115 U/L 91  Albumin Latest Ref Range: 3.6 - 5.1 g/dL 3.9  AST Latest Ref Range: 10 - 30 U/L 18  ALT Latest Ref Range: 6 - 29 U/L 26  Total Protein Latest Ref Range: 6.1 - 8.1 g/dL 6.4  Total Bilirubin Latest Ref Range: 0.2 - 1.2 mg/dL 0.4  Total CHOL/HDL Ratio Latest Ref Range: <5.0 Ratio 4.9  Cholesterol Latest Ref Range: <200 mg/dL 195  HDL Cholesterol Latest Ref Range: >50 mg/dL 40 (L)  LDL (calc) Latest Ref Range: <100 mg/dL 121 (H)  Triglycerides Latest Ref Range: <150 mg/dL 168 (H)  VLDL Latest Ref Range: <30 mg/dL 34 (H)  Vitamin D, 25-Hydroxy Latest Ref Range: 30 - 100 ng/mL 11 (L)  WBC Latest Ref Range: 3.8 - 10.8 K/uL 15.3 (H)  RBC Latest Ref Range: 3.80 - 5.10 MIL/uL 4.92  Hemoglobin Latest Ref Range: 11.7 - 15.5 g/dL 12.2  HCT Latest Ref Range: 35.0 - 45.0 % 37.7  MCV Latest Ref Range: 80.0 - 100.0 fL 76.6 (L)  MCH Latest Ref Range: 27.0 - 33.0 pg 24.8 (L)  MCHC Latest Ref Range: 32.0 - 36.0 g/dL 32.4  RDW Latest Ref Range: 11.0 - 15.0 % 16.9 (H)  Platelets Latest Ref Range: 140 - 400 K/uL 271  MPV Latest Ref Range: 7.5 - 12.5 fL 9.7  Hemoglobin A1C Latest Ref Range: <5.7 % 7.5 (H)  TSH Latest Units: mIU/L 1.38  URINALYSIS, ROUTINE W REFLEX MICROSCOPIC Unknown Rpt  Appearance Latest Ref Range: CLEAR  CLEAR  Bilirubin Urine Latest Ref Range: NEGATIVE  NEGATIVE  Color, Urine Latest Ref Range: YELLOW  YELLOW  Glucose Latest Ref Range: NEGATIVE  NEGATIVE  Hgb urine dipstick Latest Ref Range: NEGATIVE  NEGATIVE  Ketones, ur Latest Ref Range: NEGATIVE  NEGATIVE  Leukocytes, UA Latest Ref Range: NEGATIVE  NEGATIVE  Nitrite Latest Ref Range: NEGATIVE  NEGATIVE  pH Latest Ref Range: 5.0 - 8.0  7.0  Protein Latest Ref Range: NEGATIVE   NEGATIVE  Specific Gravity, Urine Latest Ref Range: 1.001 - 1.035  1.020   BP (!) 144/78   Pulse 100   Temp 97.9 F (36.6 C) (Temporal)   Resp 20   Ht 5\' 5"  (1.651 m)   Wt (!) 331 lb 1.9 oz (150.2 kg)   SpO2 97%   BMI 55.10 kg/m   Physical Exam  Constitutional: She is oriented to person, place, and time. She appears well-developed and well-nourished.  Morbidly obese  HENT:  Head: Normocephalic and atraumatic.  Eyes: Conjunctivae are normal. Pupils are equal, round, and reactive to light.  Neck: Normal range of motion. No thyromegaly present.  Cardiovascular: Normal rate and regular rhythm.   Pulmonary/Chest: Effort normal and breath sounds normal.  Musculoskeletal: She exhibits no edema.  Lymphadenopathy:    She has no cervical adenopathy.  Neurological: She is alert and  oriented to person, place, and time.  Psychiatric: She has a normal mood and affect. Her behavior is normal.    ASSESSMENT/PLAN:  1. Diabetes mellitus type 2 in obese ([facility])  - CBC - Comprehensive metabolic panel - Hemoglobin A1c - Lipid panel - Microalbumin / creatinine urine ratio - Urinalysis, Routine w reflex microscopic  2. Vitamin D deficiency  - VITAMIN D 25 Hydroxy (Vit-D Deficiency, Fractures)  3. Elevated blood-pressure reading without diagnosis of hypertension Low-salt diet, weight loss, and exercise recommended  4. Pulmonary nodule less than 6 cm determined by computed tomography of lung - CT CHEST NODULE FOLLOW UP LOW DOSE W/O; Future  5. PCOS (polycystic ovarian syndrome) Recommend follow-up with GYN - FSH - LH - Testosterone   Patient Instructions  Need blood work in June Continue on your good diet Walk every day that you are able We will check hormone levels today I have ordered a CT of your chest  CONGRATULATIONS ON LOSING WEIGHT  CONTINUE TO TRY TO QUIT SMOKING  See me in June     Raylene Everts, MD

## 2016-12-24 NOTE — Patient Instructions (Addendum)
Need blood work in June Continue on your good diet Walk every day that you are able We will check hormone levels today I have ordered a CT of your chest  CONGRATULATIONS ON LOSING WEIGHT  CONTINUE TO TRY TO QUIT SMOKING  See me in June

## 2016-12-31 ENCOUNTER — Ambulatory Visit: Payer: Self-pay | Admitting: Nutrition
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
## 2017-01-06 ENCOUNTER — Encounter (HOSPITAL_COMMUNITY): Payer: Self-pay | Admitting: Psychiatry

## 2017-01-06 ENCOUNTER — Ambulatory Visit (INDEPENDENT_AMBULATORY_CARE_PROVIDER_SITE_OTHER): Payer: Medicaid Other | Admitting: Psychiatry

## 2017-01-06 VITALS — BP 153/97 | HR 92 | Ht 65.0 in | Wt 334.2 lb

## 2017-01-06 DIAGNOSIS — F17210 Nicotine dependence, cigarettes, uncomplicated: Secondary | ICD-10-CM

## 2017-01-06 DIAGNOSIS — F4310 Post-traumatic stress disorder, unspecified: Secondary | ICD-10-CM

## 2017-01-06 DIAGNOSIS — Z818 Family history of other mental and behavioral disorders: Secondary | ICD-10-CM | POA: Diagnosis not present

## 2017-01-06 DIAGNOSIS — F332 Major depressive disorder, recurrent severe without psychotic features: Secondary | ICD-10-CM | POA: Diagnosis not present

## 2017-01-06 DIAGNOSIS — Z813 Family history of other psychoactive substance abuse and dependence: Secondary | ICD-10-CM

## 2017-01-06 DIAGNOSIS — F419 Anxiety disorder, unspecified: Secondary | ICD-10-CM | POA: Diagnosis not present

## 2017-01-06 MED ORDER — PRAZOSIN HCL 1 MG PO CAPS: 1.0000 mg | ORAL_CAPSULE | Freq: Every day | ORAL | 1 refills | Status: DC

## 2017-01-06 MED ORDER — DULOXETINE HCL 30 MG PO CPEP: 30.0000 mg | ORAL_CAPSULE | Freq: Every day | ORAL | 1 refills | Status: DC

## 2017-01-06 NOTE — Patient Instructions (Addendum)
1. Continue duloxetine 30 mg daily 2. Continue Ativan 1 mg twice a day as needed for anxiety 3. Continue Prazosin 1 mg at night 4. Return to clinic in one month 5. Contact for therapy: Dr. Alford Highland Schneidmiller  [phone] [address] [place], [place] [address]

## 2017-01-14 ENCOUNTER — Ambulatory Visit (HOSPITAL_COMMUNITY): Payer: Medicaid Other

## 2017-01-27 ENCOUNTER — Telehealth: Payer: Self-pay

## 2017-01-27 DIAGNOSIS — R911 Solitary pulmonary nodule: Secondary | ICD-10-CM

## 2017-01-27 NOTE — Telephone Encounter (Signed)
-----   Message from Raylene Everts, MD sent at 01/24/2017  2:35 PM EDT ----- Regarding: FW: Clarification Contact: [phone]  Pend correct test, please ----- Message ----- From: Sammuel Bailiff Sent: 01/24/2017   2:09 PM To: Raylene Everts, MD Subject: Clarification                                  CT Chest Nodule Follow up low dose is for patients who need a follow up of a malignant lung nodules or nodules in patients with a history of cancer. This is part of the lung cancer screening protocols for smokers.  You will need to change order to CT Chest with contrast or CT Chest without contrast.   Please call [phone] if you have any questions.

## 2017-01-28 ENCOUNTER — Ambulatory Visit (HOSPITAL_COMMUNITY)
Admission: RE | Admit: 2017-01-28 | Discharge: 2017-01-28 | Disposition: A | Discharge status: Home / Self Care | Payer: Medicaid Other | Source: Ambulatory Visit | Attending: Family Medicine | Admitting: Family Medicine

## 2017-01-28 DIAGNOSIS — R911 Solitary pulmonary nodule: Secondary | ICD-10-CM | POA: Diagnosis present

## 2017-01-28 DIAGNOSIS — D3502 Benign neoplasm of left adrenal gland: Secondary | ICD-10-CM | POA: Insufficient documentation

## 2017-01-28 DIAGNOSIS — R918 Other nonspecific abnormal finding of lung field: Secondary | ICD-10-CM | POA: Insufficient documentation
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
## 2017-02-03 ENCOUNTER — Ambulatory Visit (INDEPENDENT_AMBULATORY_CARE_PROVIDER_SITE_OTHER): Payer: Medicaid Other | Admitting: Psychiatry

## 2017-02-03 VITALS — BP 138/80 | HR 100 | Ht 65.0 in | Wt 325.6 lb

## 2017-02-03 DIAGNOSIS — F329 Major depressive disorder, single episode, unspecified: Secondary | ICD-10-CM

## 2017-02-03 DIAGNOSIS — Z79899 Other long term (current) drug therapy: Secondary | ICD-10-CM | POA: Diagnosis not present

## 2017-02-03 DIAGNOSIS — F4310 Post-traumatic stress disorder, unspecified: Secondary | ICD-10-CM | POA: Diagnosis not present

## 2017-02-03 DIAGNOSIS — F419 Anxiety disorder, unspecified: Secondary | ICD-10-CM | POA: Diagnosis not present

## 2017-02-03 DIAGNOSIS — Z818 Family history of other mental and behavioral disorders: Secondary | ICD-10-CM

## 2017-02-03 DIAGNOSIS — G8929 Other chronic pain: Secondary | ICD-10-CM

## 2017-02-03 DIAGNOSIS — E282 Polycystic ovarian syndrome: Secondary | ICD-10-CM

## 2017-02-03 DIAGNOSIS — Z813 Family history of other psychoactive substance abuse and dependence: Secondary | ICD-10-CM

## 2017-02-03 DIAGNOSIS — F17210 Nicotine dependence, cigarettes, uncomplicated: Secondary | ICD-10-CM

## 2017-02-03 DIAGNOSIS — F331 Major depressive disorder, recurrent, moderate: Secondary | ICD-10-CM | POA: Insufficient documentation

## 2017-02-03 DIAGNOSIS — E109 Type 1 diabetes mellitus without complications: Secondary | ICD-10-CM

## 2017-02-03 DIAGNOSIS — E559 Vitamin D deficiency, unspecified: Secondary | ICD-10-CM

## 2017-02-03 MED ORDER — LORAZEPAM 1 MG PO TABS: 1.0000 mg | ORAL_TABLET | Freq: Two times a day (BID) | ORAL | 0 refills | Status: DC | PRN

## 2017-02-03 MED ORDER — DULOXETINE HCL 30 MG PO CPEP: ORAL_CAPSULE | ORAL | 1 refills | Status: DC

## 2017-02-03 MED ORDER — PRAZOSIN HCL 1 MG PO CAPS: 1.0000 mg | ORAL_CAPSULE | Freq: Every day | ORAL | 1 refills | Status: DC

## 2017-02-03 NOTE — Patient Instructions (Addendum)
1. Increase duloxetine 60 mg in the morning and 30 mg in the afternoon 2. Continue Ativan 1 mg twice a day as needed for anxiety 3. Continue Prazosin 1 mg at night 4. Return to clinic in one month 5. Referral to therapy

## 2017-02-11 ENCOUNTER — Ambulatory Visit (HOSPITAL_COMMUNITY): Payer: Medicaid Other | Admitting: Licensed Clinical Social Worker

## 2017-02-14 LAB — COMPREHENSIVE METABOLIC PANEL
ALT: 24 U/L (ref 6–29)
AST: 19 U/L (ref 10–30)
Albumin: 4 g/dL (ref 3.6–5.1)
Alkaline Phosphatase: 87 U/L (ref 33–115)
BILIRUBIN TOTAL: 0.5 mg/dL (ref 0.2–1.2)
BUN: 8 mg/dL (ref 7–25)
CHLORIDE: 102 mmol/L (ref 98–110)
CO2: 23 mmol/L (ref 20–31)
CREATININE: 0.53 mg/dL (ref 0.50–1.10)
Calcium: 8.7 mg/dL (ref 8.6–10.2)
GLUCOSE: 187 mg/dL — AB (ref 65–99)
Potassium: 4.1 mmol/L (ref 3.5–5.3)
SODIUM: 137 mmol/L (ref 135–146)
Total Protein: 6.6 g/dL (ref 6.1–8.1)

## 2017-02-14 LAB — URINALYSIS, MICROSCOPIC ONLY
BACTERIA UA: NONE SEEN [HPF]
CRYSTALS: NONE SEEN [HPF]
Casts: NONE SEEN [LPF]
RBC / HPF: NONE SEEN RBC/HPF (ref <=2)
Yeast: NONE SEEN [HPF]

## 2017-02-14 LAB — URINALYSIS, ROUTINE W REFLEX MICROSCOPIC
Bilirubin Urine: NEGATIVE
Glucose, UA: NEGATIVE
Hgb urine dipstick: NEGATIVE
KETONES UR: NEGATIVE
Leukocytes, UA: NEGATIVE
NITRITE: NEGATIVE
PH: 5.5 (ref 5.0–8.0)
Protein, ur: NEGATIVE
Specific Gravity, Urine: 1.024 (ref 1.001–1.035)

## 2017-02-14 LAB — CBC
HCT: 40.4 % (ref 35.0–45.0)
Hemoglobin: 13 g/dL (ref 11.7–15.5)
MCH: 25 pg — ABNORMAL LOW (ref 27.0–33.0)
MCHC: 32.2 g/dL (ref 32.0–36.0)
MCV: 77.8 fL — ABNORMAL LOW (ref 80.0–100.0)
MPV: 9.4 fL (ref 7.5–12.5)
PLATELETS: 273 10*3/uL (ref 140–400)
RBC: 5.19 MIL/uL — ABNORMAL HIGH (ref 3.80–5.10)
RDW: 15.2 % — AB (ref 11.0–15.0)
WBC: 12.2 10*3/uL — AB (ref 3.8–10.8)

## 2017-02-14 LAB — MICROALBUMIN / CREATININE URINE RATIO
CREATININE, URINE: 225 mg/dL (ref 20–320)
Microalb Creat Ratio: 8 mcg/mg creat (ref <30)
Microalb, Ur: 1.9 mg/dL

## 2017-02-14 LAB — LIPID PANEL
CHOL/HDL RATIO: 5.8 ratio — AB (ref <5.0)
Cholesterol: 202 mg/dL — ABNORMAL HIGH (ref <200)
HDL: 35 mg/dL — AB (ref >50)
LDL Cholesterol: 138 mg/dL — ABNORMAL HIGH (ref <100)
Triglycerides: 146 mg/dL (ref <150)
VLDL: 29 mg/dL (ref <30)

## 2017-02-15 LAB — HEMOGLOBIN A1C
Hgb A1c MFr Bld: 7.5 % — ABNORMAL HIGH (ref <5.7)
Mean Plasma Glucose: 169 mg/dL

## 2017-02-15 LAB — VITAMIN D 25 HYDROXY (VIT D DEFICIENCY, FRACTURES): VIT D 25 HYDROXY: 20 ng/mL — AB (ref 30–100)

## 2017-02-17 ENCOUNTER — Encounter: Payer: Self-pay | Admitting: Family Medicine

## 2017-02-17 ENCOUNTER — Ambulatory Visit (INDEPENDENT_AMBULATORY_CARE_PROVIDER_SITE_OTHER): Payer: Medicaid Other | Admitting: Family Medicine

## 2017-02-17 VITALS — BP 138/82 | HR 92 | Temp 98.0°F | Resp 16 | Ht 65.0 in | Wt 326.1 lb

## 2017-02-17 DIAGNOSIS — E785 Hyperlipidemia, unspecified: Secondary | ICD-10-CM | POA: Insufficient documentation

## 2017-02-17 DIAGNOSIS — E1169 Type 2 diabetes mellitus with other specified complication: Secondary | ICD-10-CM | POA: Diagnosis not present

## 2017-02-17 DIAGNOSIS — Z720 Tobacco use: Secondary | ICD-10-CM | POA: Diagnosis not present

## 2017-02-17 DIAGNOSIS — E782 Mixed hyperlipidemia: Secondary | ICD-10-CM | POA: Insufficient documentation

## 2017-02-17 DIAGNOSIS — E559 Vitamin D deficiency, unspecified: Secondary | ICD-10-CM | POA: Diagnosis not present

## 2017-02-17 DIAGNOSIS — E669 Obesity, unspecified: Secondary | ICD-10-CM

## 2017-02-17 HISTORY — DX: Hyperlipidemia, unspecified: E78.5

## 2017-02-17 MED ORDER — BLOOD GLUCOSE MONITOR KIT: PACK | 0 refills | Status: DC

## 2017-02-17 MED ORDER — LOVASTATIN 20 MG PO TABS: 20.0000 mg | ORAL_TABLET | Freq: Every day | ORAL | 3 refills | Status: DC

## 2017-02-17 MED ORDER — METFORMIN HCL ER 500 MG PO TB24: 500.0000 mg | ORAL_TABLET | Freq: Every day | ORAL | 3 refills | Status: DC

## 2017-02-17 MED ORDER — VITAMIN D (ERGOCALCIFEROL) 1.25 MG (50000 UNIT) PO CAPS: 50000.0000 [IU] | ORAL_CAPSULE | ORAL | 0 refills | Status: DC

## 2017-02-17 NOTE — Progress Notes (Signed)
Chief Complaint  Patient presents with  . Follow-up   Reviewed recent labs Reviewed CT scan results Has lose a few pounds Her A1c is 7.5 and her lipids went up BO normal today Husband is here and says she has not exercised or limited eating Didn't go to diabetic ED yet - re ordered and emphasized Patient is given a glucose monitor Advised strongly to quit smoking   Patient Active Problem List   Diagnosis Date Noted  . HLD (hyperlipidemia) 02/17/2017  . Major depressive disorder, recurrent episode, moderate ([place]) 02/03/2017  . Vitamin D deficiency 11/20/2016  . Diabetes mellitus type 2 in obese ([place]) 11/20/2016  . PTSD (post-traumatic stress disorder) 11/19/2016  . Elevated blood-pressure reading without diagnosis of hypertension 11/19/2016  . History of hernia surgery 11/19/2016  . Migraine headache 11/19/2016  . Pulmonary nodule/lesion, solitary 11/19/2016  . Mucosal abnormality of stomach   . Tobacco abuse 06/06/2013  . Morbid obesity ([place]) 06/06/2013    Outpatient Encounter Prescriptions as of 02/17/2017  Medication Sig  . cholecalciferol (VITAMIN D) 1000 units tablet Take 2,000 Units by mouth daily.  . DULoxetine (CYMBALTA) 30 MG capsule 60 mg in the morning and 30 mg in the afternoon  . LORazepam (ATIVAN) 1 MG tablet Take 1 tablet (1 mg total) by mouth 2 (two) times daily as needed for anxiety.  . prazosin (MINIPRESS) 1 MG capsule Take 1 capsule (1 mg total) by mouth at bedtime.  . blood glucose meter kit and supplies KIT Dispense based on patient and insurance preference. Use up to four times daily as directed. (FOR ICD-9 250.00, 250.01).  Marland Kitchen lovastatin (MEVACOR) 20 MG tablet Take 1 tablet (20 mg total) by mouth at bedtime.  . metFORMIN (GLUCOPHAGE XR) 500 MG 24 hr tablet Take 1 tablet (500 mg total) by mouth daily with breakfast.  . Vitamin D, Ergocalciferol, (DRISDOL) 50000 units CAPS capsule Take 1 capsule (50,000 Units total) by mouth every 7 (seven) days.   No  facility-administered encounter medications on file as of 02/17/2017.     No Known Allergies  Review of Systems  Constitutional: Negative for activity change, appetite change and unexpected weight change.  HENT: Negative for congestion, dental problem, postnasal drip and rhinorrhea.   Eyes: Negative for redness and visual disturbance.  Respiratory: Negative for cough and shortness of breath.   Cardiovascular: Negative for chest pain, palpitations and leg swelling.  Gastrointestinal: Negative for abdominal pain, constipation and diarrhea.  Endocrine: Positive for polyuria.  Genitourinary: Negative for difficulty urinating, frequency and menstrual problem.  Musculoskeletal: Positive for back pain. Negative for arthralgias.       Chronic  Neurological: Negative for dizziness and headaches.  Psychiatric/Behavioral: Negative for dysphoric mood and sleep disturbance. The patient is not nervous/anxious.     BP 138/82 (BP Location: Right Arm, Patient Position: Sitting, Cuff Size: Large)   Pulse 92   Temp 98 F (36.7 C) (Temporal)   Resp 16   Ht '5\' 5"'$  (1.651 m)   Wt (!) 326 lb 1.9 oz (147.9 kg)   SpO2 96%   BMI 54.27 kg/m  Results for Patricia, Stanton (MRN [id number]) as of 02/17/2017 11:21  Ref. Range 02/14/2017 08:42  Sodium Latest Ref Range: 135 - 146 mmol/L 137  Potassium Latest Ref Range: 3.5 - 5.3 mmol/L 4.1  Chloride Latest Ref Range: 98 - 110 mmol/L 102  CO2 Latest Ref Range: 20 - 31 mmol/L 23  Glucose Latest Ref Range: 65 - 99 mg/dL 187 (H)  Mean Plasma Glucose Latest Units: mg/dL 169  BUN Latest Ref Range: 7 - 25 mg/dL 8  Creatinine Latest Ref Range: 0.50 - 1.10 mg/dL 0.53  Calcium Latest Ref Range: 8.6 - 10.2 mg/dL 8.7  Alkaline Phosphatase Latest Ref Range: 33 - 115 U/L 87  Albumin Latest Ref Range: 3.6 - 5.1 g/dL 4.0  AST Latest Ref Range: 10 - 30 U/L 19  ALT Latest Ref Range: 6 - 29 U/L 24  Total Protein Latest Ref Range: 6.1 - 8.1 g/dL 6.6  Total Bilirubin Latest Ref  Range: 0.2 - 1.2 mg/dL 0.5  Total CHOL/HDL Ratio Latest Ref Range: <5.0 Ratio 5.8 (H)  Cholesterol Latest Ref Range: <200 mg/dL 202 (H)  HDL Cholesterol Latest Ref Range: >50 mg/dL 35 (L)  LDL (calc) Latest Ref Range: <100 mg/dL 138 (H)  MICROALB/CREAT RATIO Latest Ref Range: <30 mcg/mg creat 8  Triglycerides Latest Ref Range: <150 mg/dL 146  VLDL Latest Ref Range: <30 mg/dL 29  Vitamin D, 25-Hydroxy Latest Ref Range: 30 - 100 ng/mL 20 (L)  WBC Latest Ref Range: 3.8 - 10.8 K/uL 12.2 (H)  RBC Latest Ref Range: 3.80 - 5.10 MIL/uL 5.19 (H)  Hemoglobin Latest Ref Range: 11.7 - 15.5 g/dL 13.0  HCT Latest Ref Range: 35.0 - 45.0 % 40.4  MCV Latest Ref Range: 80.0 - 100.0 fL 77.8 (L)  MCH Latest Ref Range: 27.0 - 33.0 pg 25.0 (L)  MCHC Latest Ref Range: 32.0 - 36.0 g/dL 32.2  RDW Latest Ref Range: 11.0 - 15.0 % 15.2 (H)  Platelets Latest Ref Range: 140 - 400 K/uL 273  MPV Latest Ref Range: 7.5 - 12.5 fL 9.4   Physical Exam  Constitutional: She is oriented to person, place, and time. She appears well-developed and well-nourished.  Morbidly obese  HENT:  Head: Normocephalic and atraumatic.  Eyes: Conjunctivae are normal. Pupils are equal, round, and reactive to light.  Neck: Normal range of motion. No thyromegaly present.  Cardiovascular: Normal rate and regular rhythm.   Pulmonary/Chest: Effort normal and breath sounds normal.  Musculoskeletal: She exhibits no edema.  Lymphadenopathy:    She has no cervical adenopathy.  Neurological: She is alert and oriented to person, place, and time.  Psychiatric: She has a normal mood and affect. Her behavior is normal.    ASSESSMENT/PLAN:  1. Vitamin D deficiency Repeat vitamin D 50000  2. Diabetes mellitus type 2 in obese (HCC) - COMPLETE METABOLIC PANEL WITH GFR - Hemoglobin A1c - Lipid panel - VITAMIN D 25 Hydroxy (Vit-D Deficiency, Fractures) - Urinalysis, Routine w reflex microscopic - Ambulatory referral to diabetic education  3.  Hyperlipidemia, unspecified hyperlipidemia type  4. Tobacco abuse  5. Morbid obesity [facility])    Patient Instructions  Check sugar fasting daily See diabetes educator Take the metformin once a day with breakfast Take the lovastatin at night See me in 3 months Get labs prior to visit Call sooner for problems   Raylene Everts, MD

## 2017-02-17 NOTE — Patient Instructions (Signed)
Check sugar fasting daily See diabetes educator Take the metformin once a day with breakfast Take the lovastatin at night See me in 3 months Get labs prior to visit Call sooner for problems
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
## 2017-03-03 ENCOUNTER — Ambulatory Visit (HOSPITAL_COMMUNITY): Payer: Medicaid Other | Admitting: Psychiatry
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
## 2017-03-07 ENCOUNTER — Encounter (HOSPITAL_COMMUNITY): Payer: Self-pay | Admitting: Psychiatry

## 2017-03-07 ENCOUNTER — Ambulatory Visit (INDEPENDENT_AMBULATORY_CARE_PROVIDER_SITE_OTHER): Payer: Medicaid Other | Admitting: Psychiatry

## 2017-03-07 VITALS — BP 148/102 | HR 92 | Ht 65.0 in | Wt 325.0 lb

## 2017-03-07 DIAGNOSIS — E282 Polycystic ovarian syndrome: Secondary | ICD-10-CM | POA: Diagnosis not present

## 2017-03-07 DIAGNOSIS — Z9049 Acquired absence of other specified parts of digestive tract: Secondary | ICD-10-CM

## 2017-03-07 DIAGNOSIS — F17210 Nicotine dependence, cigarettes, uncomplicated: Secondary | ICD-10-CM

## 2017-03-07 DIAGNOSIS — E108 Type 1 diabetes mellitus with unspecified complications: Secondary | ICD-10-CM

## 2017-03-07 DIAGNOSIS — Z818 Family history of other mental and behavioral disorders: Secondary | ICD-10-CM

## 2017-03-07 DIAGNOSIS — F4310 Post-traumatic stress disorder, unspecified: Secondary | ICD-10-CM

## 2017-03-07 DIAGNOSIS — F331 Major depressive disorder, recurrent, moderate: Secondary | ICD-10-CM

## 2017-03-07 DIAGNOSIS — F419 Anxiety disorder, unspecified: Secondary | ICD-10-CM

## 2017-03-07 MED ORDER — PRAZOSIN HCL 1 MG PO CAPS: 1.0000 mg | ORAL_CAPSULE | Freq: Every day | ORAL | 1 refills | Status: DC

## 2017-03-07 MED ORDER — DULOXETINE HCL 30 MG PO CPEP: ORAL_CAPSULE | ORAL | 1 refills | Status: DC

## 2017-03-07 MED ORDER — LORAZEPAM 1 MG PO TABS: 1.0000 mg | ORAL_TABLET | Freq: Every day | ORAL | 1 refills | Status: DC | PRN

## 2017-03-07 NOTE — Patient Instructions (Signed)
1. Continue duloxetine 60 mg in the morning and 30 mg in the afternoon 2. Decrease ativan 1 mg once a day as needed for anxiety 3. Continue Prazosin 1 mg at night 4. Return to clinic in two months 5. Referral to therapy- Ms. Bynum

## 2017-03-11 ENCOUNTER — Telehealth (HOSPITAL_COMMUNITY): Payer: Self-pay | Admitting: *Deleted

## 2017-03-11 NOTE — Telephone Encounter (Signed)
Left voice message regarding scheduling an appointment.

## 2017-04-08 ENCOUNTER — Ambulatory Visit: Payer: Self-pay | Admitting: Nutrition

## 2017-04-10 ENCOUNTER — Telehealth: Payer: Self-pay | Admitting: Nutrition

## 2017-04-10 ENCOUNTER — Ambulatory Visit: Payer: Self-pay | Admitting: Nutrition

## 2017-04-10 NOTE — Telephone Encounter (Signed)
vm to call and reschedule missed appt. [phone]

## 2017-04-21 ENCOUNTER — Telehealth: Payer: Self-pay | Admitting: Nutrition

## 2017-04-21 NOTE — Telephone Encounter (Signed)
VM to call and reschedule missed appt. 
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
## 2017-05-02 ENCOUNTER — Ambulatory Visit (HOSPITAL_COMMUNITY): Payer: Medicaid Other | Admitting: Psychiatry

## 2017-05-12 ENCOUNTER — Telehealth: Payer: Self-pay | Admitting: Nutrition

## 2017-05-12 ENCOUNTER — Ambulatory Visit: Payer: Self-pay | Admitting: Nutrition

## 2017-05-12 NOTE — Telephone Encounter (Signed)
VM left to cal and rescheduled missed appt.

## 2017-05-20 ENCOUNTER — Ambulatory Visit: Payer: Self-pay | Admitting: Family Medicine
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
## 2017-05-22 ENCOUNTER — Encounter (HOSPITAL_COMMUNITY): Payer: Self-pay | Admitting: Psychiatry

## 2017-05-22 ENCOUNTER — Ambulatory Visit (INDEPENDENT_AMBULATORY_CARE_PROVIDER_SITE_OTHER): Payer: Medicaid Other | Admitting: Psychiatry

## 2017-05-22 VITALS — BP 156/115 | HR 100 | Ht 65.0 in | Wt 323.0 lb

## 2017-05-22 DIAGNOSIS — F17210 Nicotine dependence, cigarettes, uncomplicated: Secondary | ICD-10-CM

## 2017-05-22 DIAGNOSIS — F331 Major depressive disorder, recurrent, moderate: Secondary | ICD-10-CM

## 2017-05-22 DIAGNOSIS — F4310 Post-traumatic stress disorder, unspecified: Secondary | ICD-10-CM | POA: Diagnosis not present

## 2017-05-22 DIAGNOSIS — E282 Polycystic ovarian syndrome: Secondary | ICD-10-CM

## 2017-05-22 DIAGNOSIS — Z634 Disappearance and death of family member: Secondary | ICD-10-CM | POA: Diagnosis not present

## 2017-05-22 DIAGNOSIS — E559 Vitamin D deficiency, unspecified: Secondary | ICD-10-CM | POA: Diagnosis not present

## 2017-05-22 DIAGNOSIS — I10 Essential (primary) hypertension: Secondary | ICD-10-CM

## 2017-05-22 DIAGNOSIS — G4700 Insomnia, unspecified: Secondary | ICD-10-CM | POA: Diagnosis not present

## 2017-05-22 DIAGNOSIS — E109 Type 1 diabetes mellitus without complications: Secondary | ICD-10-CM

## 2017-05-22 DIAGNOSIS — Z9049 Acquired absence of other specified parts of digestive tract: Secondary | ICD-10-CM

## 2017-05-22 LAB — VITAMIN D 25 HYDROXY (VIT D DEFICIENCY, FRACTURES): VIT D 25 HYDROXY: 24 ng/mL — AB (ref 30–100)

## 2017-05-22 LAB — COMPLETE METABOLIC PANEL WITH GFR
AG RATIO: 1.5 (calc) (ref 1.0–2.5)
ALKALINE PHOSPHATASE (APISO): 86 U/L (ref 33–115)
ALT: 20 U/L (ref 6–29)
AST: 14 U/L (ref 10–30)
Albumin: 3.7 g/dL (ref 3.6–5.1)
BILIRUBIN TOTAL: 0.3 mg/dL (ref 0.2–1.2)
BUN: 8 mg/dL (ref 7–25)
CHLORIDE: 106 mmol/L (ref 98–110)
CO2: 21 mmol/L (ref 20–32)
Calcium: 8.5 mg/dL — ABNORMAL LOW (ref 8.6–10.2)
Creat: 0.5 mg/dL (ref 0.50–1.10)
GFR, EST AFRICAN AMERICAN: 148 mL/min/{1.73_m2} (ref >=60)
GFR, Est Non African American: 128 mL/min/{1.73_m2} (ref >=60)
GLUCOSE: 197 mg/dL — AB (ref 65–99)
Globulin: 2.5 g/dL (calc) (ref 1.9–3.7)
POTASSIUM: 4.2 mmol/L (ref 3.5–5.3)
Sodium: 137 mmol/L (ref 135–146)
TOTAL PROTEIN: 6.2 g/dL (ref 6.1–8.1)

## 2017-05-22 LAB — HEMOGLOBIN A1C
Hgb A1c MFr Bld: 7.6 % of total Hgb — ABNORMAL HIGH (ref <5.7)
Mean Plasma Glucose: 171 (calc)
eAG (mmol/L): 9.5 (calc)

## 2017-05-22 LAB — URINALYSIS, ROUTINE W REFLEX MICROSCOPIC
BILIRUBIN URINE: NEGATIVE
GLUCOSE, UA: NEGATIVE
Hgb urine dipstick: NEGATIVE
Ketones, ur: NEGATIVE
LEUKOCYTES UA: NEGATIVE
Nitrite: NEGATIVE
Protein, ur: NEGATIVE
SPECIFIC GRAVITY, URINE: 1.03 (ref 1.001–1.03)

## 2017-05-22 LAB — LIPID PANEL
CHOLESTEROL: 168 mg/dL (ref <200)
HDL: 35 mg/dL — AB (ref >50)
LDL Cholesterol (Calc): 106 mg/dL (calc) — ABNORMAL HIGH
NON-HDL CHOLESTEROL (CALC): 133 mg/dL — AB (ref <130)
TRIGLYCERIDES: 155 mg/dL — AB (ref <150)
Total CHOL/HDL Ratio: 4.8 (calc) (ref <5.0)

## 2017-05-22 MED ORDER — LORAZEPAM 1 MG PO TABS: 1.0000 mg | ORAL_TABLET | Freq: Every day | ORAL | 1 refills | Status: DC | PRN

## 2017-05-22 MED ORDER — DULOXETINE HCL 30 MG PO CPEP: ORAL_CAPSULE | ORAL | 1 refills | Status: DC

## 2017-05-22 MED ORDER — PRAZOSIN HCL 1 MG PO CAPS: 1.0000 mg | ORAL_CAPSULE | Freq: Every day | ORAL | 1 refills | Status: DC

## 2017-05-22 NOTE — Patient Instructions (Signed)
1. Continue duloxetine 60 mg in the morning and 30 mg in the afternoon 2. Continue ativan 1 mg once a day as needed for anxiety 3. Continue prazosin 1 mg at night 4. Return to clinic in two months 5. Referral to therapy

## 2017-05-23 ENCOUNTER — Encounter: Payer: Self-pay | Admitting: Family Medicine

## 2017-05-30 ENCOUNTER — Ambulatory Visit: Payer: Self-pay | Admitting: Family Medicine

## 2017-06-03 ENCOUNTER — Encounter: Payer: Self-pay | Admitting: Family Medicine

## 2017-06-03 ENCOUNTER — Ambulatory Visit (INDEPENDENT_AMBULATORY_CARE_PROVIDER_SITE_OTHER): Payer: Medicaid Other | Admitting: Family Medicine

## 2017-06-03 VITALS — BP 138/88 | HR 100 | Temp 98.1°F | Resp 18 | Ht 65.0 in | Wt 328.0 lb

## 2017-06-03 DIAGNOSIS — Z9119 Patient's noncompliance with other medical treatment and regimen: Secondary | ICD-10-CM

## 2017-06-03 DIAGNOSIS — R030 Elevated blood-pressure reading, without diagnosis of hypertension: Secondary | ICD-10-CM

## 2017-06-03 DIAGNOSIS — E785 Hyperlipidemia, unspecified: Secondary | ICD-10-CM

## 2017-06-03 DIAGNOSIS — Z91199 Patient's noncompliance with other medical treatment and regimen due to unspecified reason: Secondary | ICD-10-CM

## 2017-06-03 DIAGNOSIS — Z23 Encounter for immunization: Secondary | ICD-10-CM | POA: Diagnosis not present

## 2017-06-03 NOTE — Progress Notes (Signed)
Chief Complaint  Patient presents with  . Hypertension  had an elevated BP reading at her psych visit so stopped her Glucophage She agreed to go to a diabetes nutrition consult then no showed for 3 appointments Is here today requesting a pill to help her lose weight Has not exercised because one of her pill bottles says to avoid sunlight.  Unclear which one.   Her recent labs are discussed with her.  A1c still elevated at 7.6.  Lipids a little better.  Is taking mevacor. Still smokes.  Is reminded of the health risks  Patient Active Problem List   Diagnosis Date Noted  . Noncompliance 06/03/2017  . HLD (hyperlipidemia) 02/17/2017  . Major depressive disorder, recurrent episode, moderate ([place]) 02/03/2017  . Vitamin D deficiency 11/20/2016  . Diabetes mellitus type 2 in obese ([place]) 11/20/2016  . PTSD (post-traumatic stress disorder) 11/19/2016  . Elevated blood-pressure reading without diagnosis of hypertension 11/19/2016  . History of hernia surgery 11/19/2016  . Migraine headache 11/19/2016  . Pulmonary nodule/lesion, solitary 11/19/2016  . Mucosal abnormality of stomach   . Tobacco abuse 06/06/2013  . Morbid obesity ([place]) 06/06/2013    Outpatient Encounter Prescriptions as of 06/03/2017  Medication Sig  . cholecalciferol (VITAMIN D) 1000 units tablet Take 2,000 Units by mouth daily.  . DULoxetine (CYMBALTA) 30 MG capsule 60 mg in the morning and 30 mg in the afternoon  . LORazepam (ATIVAN) 1 MG tablet Take 1 tablet (1 mg total) by mouth daily as needed for anxiety.  . lovastatin (MEVACOR) 20 MG tablet Take 1 tablet (20 mg total) by mouth at bedtime.  . prazosin (MINIPRESS) 1 MG capsule Take 1 capsule (1 mg total) by mouth at bedtime.  . blood glucose meter kit and supplies KIT Dispense based on patient and insurance preference. Use up to four times daily as directed. (FOR ICD-9 250.00, 250.01). (Patient not taking: Reported on 06/03/2017)  . cholecalciferol (VITAMIN D) 1000  units tablet Take 2,000 Units by mouth daily.  . metFORMIN (GLUCOPHAGE XR) 500 MG 24 hr tablet Take 1 tablet (500 mg total) by mouth daily with breakfast. (Patient not taking: Reported on 06/03/2017)   No facility-administered encounter medications on file as of 06/03/2017.     No Known Allergies  Review of Systems  Constitutional: Negative for activity change, appetite change and unexpected weight change.  HENT: Negative for congestion, dental problem, postnasal drip and rhinorrhea.   Eyes: Negative for redness and visual disturbance.  Respiratory: Negative for cough and shortness of breath.   Cardiovascular: Negative for chest pain, palpitations and leg swelling.  Gastrointestinal: Negative for abdominal pain, constipation and diarrhea.  Endocrine: Positive for polyuria.  Genitourinary: Negative for difficulty urinating, frequency and menstrual problem.  Musculoskeletal: Positive for back pain. Negative for arthralgias.       Chronic,intermittnet  Neurological: Negative for dizziness and headaches.  Psychiatric/Behavioral: Negative for dysphoric mood and sleep disturbance. The patient is not nervous/anxious.     BP 138/88 (BP Location: Right Arm, Patient Position: Sitting, Cuff Size: Large)   Pulse 100   Temp 98.1 F (36.7 C) (Temporal)   Resp 18   Ht '5\' 5"'$  (1.651 m)   Wt (!) 328 lb (148.8 kg)   SpO2 97%   BMI 54.58 kg/m   Physical Exam  Constitutional: She is oriented to person, place, and time. She appears well-developed and well-nourished.  Morbidly obese  HENT:  Head: Normocephalic and atraumatic.  Eyes: Pupils are equal, round, and  reactive to light. Conjunctivae are normal.  Neck: Normal range of motion. No thyromegaly present.  Cardiovascular: Normal rate and regular rhythm.   Pulmonary/Chest: Effort normal and breath sounds normal.  Musculoskeletal: She exhibits no edema.  Lymphadenopathy:    She has no cervical adenopathy.  Neurological: She is alert and  oriented to person, place, and time.  Psychiatric: She has a normal mood and affect. Her behavior is normal.  Poor judgement.    ASSESSMENT/PLAN:  1. Hyperlipidemia, unspecified hyperlipidemia type On statin with improvement.  2. Morbid obesity ([place]) Has gained weight.  Not dieting, exercising or keeping nutrition appointments.  Asks for diet pill.  Refused.  Does NOT want surgery.  Is given name of obesity doctor in area.   - Hemoglobin A1c - Lipid panel  3. Elevated blood-pressure reading without diagnosis of hypertension Normal today  4. Need for influenza vaccination - Flu Vaccine QUAD 36+ mos IM  5. Noncompliance Discussed need to keep appointments, diet, exercise to improve health   Patient Instructions  I will talk to Dr Modesta Messing about medicine and weight loss I will see about referral to bariatric specialist Her name is Dennard Nip, MD [phone]  I am glad you are interested in weight loss I Would  Love for you to reduce or quit smoking  Walk every day that you are able  See me in 3 months Need blood work next time   Raylene Everts, MD

## 2017-06-03 NOTE — Patient Instructions (Addendum)
I will talk to Dr Modesta Messing about medicine and weight loss I will see about referral to bariatric specialist Her name is Dennard Nip, MD [phone]  I am glad you are interested in weight loss I Would  Love for you to reduce or quit smoking  Walk every day that you are able  See me in 3 months Need blood work next time

## 2017-06-27 ENCOUNTER — Ambulatory Visit: Payer: Self-pay | Admitting: Family Medicine
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
## 2017-07-22 ENCOUNTER — Ambulatory Visit (HOSPITAL_COMMUNITY): Payer: Self-pay | Admitting: Psychiatry

## 2017-07-23 ENCOUNTER — Telehealth: Payer: Self-pay | Admitting: Family Medicine

## 2017-07-23 ENCOUNTER — Ambulatory Visit (INDEPENDENT_AMBULATORY_CARE_PROVIDER_SITE_OTHER): Payer: Medicaid Other | Admitting: Psychiatry

## 2017-07-23 ENCOUNTER — Encounter (HOSPITAL_COMMUNITY): Payer: Self-pay | Admitting: Psychiatry

## 2017-07-23 ENCOUNTER — Other Ambulatory Visit: Payer: Self-pay | Admitting: Family Medicine

## 2017-07-23 VITALS — BP 180/114 | HR 101 | Ht 65.0 in | Wt 327.0 lb

## 2017-07-23 DIAGNOSIS — F331 Major depressive disorder, recurrent, moderate: Secondary | ICD-10-CM

## 2017-07-23 DIAGNOSIS — F4310 Post-traumatic stress disorder, unspecified: Secondary | ICD-10-CM

## 2017-07-23 MED ORDER — LORAZEPAM 1 MG PO TABS: 1.0000 mg | ORAL_TABLET | Freq: Every day | ORAL | 1 refills | Status: DC | PRN

## 2017-07-23 MED ORDER — PRAZOSIN HCL 1 MG PO CAPS: 1.0000 mg | ORAL_CAPSULE | Freq: Every day | ORAL | 1 refills | Status: DC

## 2017-07-23 MED ORDER — DULOXETINE HCL 60 MG PO CPEP: 60.0000 mg | ORAL_CAPSULE | Freq: Two times a day (BID) | ORAL | 1 refills | Status: DC

## 2017-07-23 NOTE — Patient Instructions (Addendum)
1. Increase duloxetine 60 mg twice a day  2. Continue ativan 1 mg once a day as needed for anxiety  3. Continue prazosin 1 mg at night  4. Return to clinic in two months for 15 mins 5. Referral to therapy
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
## 2017-07-23 NOTE — Telephone Encounter (Signed)
We discussed this belief at the visit and I told her it does NOT raise BP.  Needs to take it daily

## 2017-07-23 NOTE — Telephone Encounter (Signed)
NO changes in medicine Referral placed for her

## 2017-07-23 NOTE — Telephone Encounter (Signed)
Called jen, aware. An FYI she states the metformin is raising her b/p?

## 2017-07-23 NOTE — Telephone Encounter (Signed)
She agrees to take.

## 2017-07-23 NOTE — Telephone Encounter (Signed)
Patient is calling to check the status of Dr.Nelsons conversation with Dr. Arville Lime  About medication that was raising her blood pressure.  Also asking about Referral to Bariatric center  Cb#: [phone]

## 2017-07-28 ENCOUNTER — Encounter: Payer: Self-pay | Admitting: Family Medicine

## 2017-07-28 ENCOUNTER — Ambulatory Visit: Payer: Medicaid Other | Admitting: Family Medicine

## 2017-07-28 ENCOUNTER — Ambulatory Visit (HOSPITAL_COMMUNITY)
Admission: RE | Admit: 2017-07-28 | Discharge: 2017-07-28 | Disposition: A | Discharge status: Home / Self Care | Payer: Medicaid Other | Source: Ambulatory Visit | Attending: Family Medicine | Admitting: Family Medicine

## 2017-07-28 ENCOUNTER — Other Ambulatory Visit: Payer: Self-pay

## 2017-07-28 VITALS — BP 174/92 | HR 100 | Temp 97.9°F | Resp 20 | Ht 65.0 in | Wt 331.1 lb

## 2017-07-28 DIAGNOSIS — M545 Low back pain, unspecified: Secondary | ICD-10-CM

## 2017-07-28 DIAGNOSIS — M5136 Other intervertebral disc degeneration, lumbar region: Secondary | ICD-10-CM | POA: Diagnosis present

## 2017-07-28 DIAGNOSIS — M25512 Pain in left shoulder: Secondary | ICD-10-CM | POA: Diagnosis present

## 2017-07-28 DIAGNOSIS — M79605 Pain in left leg: Secondary | ICD-10-CM

## 2017-07-28 DIAGNOSIS — R030 Elevated blood-pressure reading, without diagnosis of hypertension: Secondary | ICD-10-CM

## 2017-07-28 MED ORDER — LISINOPRIL-HYDROCHLOROTHIAZIDE 10-12.5 MG PO TABS: 1.0000 | ORAL_TABLET | Freq: Every day | ORAL | 3 refills | Status: DC

## 2017-07-28 MED ORDER — METAXALONE 800 MG PO TABS: 800.0000 mg | ORAL_TABLET | Freq: Three times a day (TID) | ORAL | 0 refills | Status: DC

## 2017-07-28 MED ORDER — TRAMADOL HCL 50 MG PO TABS: 50.0000 mg | ORAL_TABLET | Freq: Three times a day (TID) | ORAL | 0 refills | Status: DC | PRN

## 2017-07-28 NOTE — Patient Instructions (Signed)
Activity as tolerated Ice to painful areas Tramadol as needed for pain Skelaxin as needed for muscle relaxer See orthopedist as scheduled X-rays can be done today. I will notify you of your test results  Take lisinopril/hydrochlorothiazide every day for blood pressure Return in 1-2 weeks for blood pressure check (nurse visit)
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
## 2017-07-30 ENCOUNTER — Other Ambulatory Visit: Payer: Self-pay

## 2017-07-30 MED ORDER — METHOCARBAMOL 500 MG PO TABS: 500.0000 mg | ORAL_TABLET | Freq: Three times a day (TID) | ORAL | 0 refills | Status: DC | PRN

## 2017-07-31 DIAGNOSIS — M5416 Radiculopathy, lumbar region: Secondary | ICD-10-CM | POA: Diagnosis not present

## 2017-09-19 ENCOUNTER — Encounter: Payer: Self-pay | Admitting: Gastroenterology

## 2017-09-19 ENCOUNTER — Ambulatory Visit: Payer: Medicaid Other | Admitting: Gastroenterology

## 2017-09-19 VITALS — BP 174/99 | HR 98 | Temp 98.0°F | Ht 65.0 in | Wt 329.6 lb

## 2017-09-19 DIAGNOSIS — R1013 Epigastric pain: Secondary | ICD-10-CM

## 2017-09-19 DIAGNOSIS — K5900 Constipation, unspecified: Secondary | ICD-10-CM

## 2017-09-19 NOTE — Assessment & Plan Note (Signed)
Start Linzess 145 mcg daily. Samples provided. No alarm features.

## 2017-09-19 NOTE — Progress Notes (Signed)
Referring Provider: Raylene Everts, MD Primary Care Physician:  Raylene Everts, MD Primary GI: Dr. Gala Romney   Chief Complaint  Patient presents with  . Abdominal Pain    mid abd  . abdominal swelling  . Gastroesophageal Reflux  . Constipation    HPI:   Terease Marcotte is a 32 y.o. female presenting today with a history of abdominal pain, constipation, GERD. Last seen in April 2017. Underwent EGD which showed normal esophagus, non-bleeding erosive gastropathy (Negative H.pylori). CT then completed with new midline ventral hernia in upper abdomen containing only fat, new 18 mm left adrenal nodule, 2 mm right lower lobe pulmonary nodule. She then had MRI with evidence of benign adrenal adenoma. She was to have repeat chest CT in May 2018, which was completed by PCP. She was to see Dr. Arnoldo Morale for hernia evaluation and underwent hernia surgery May 2017.   Pain in upper abdomen and swelling for about a month. Waxes and wanes in intensity but still underlying. Feels like burning. Associated nausea, no vomiting. Lots of indigestion. No PPI. Had been on Protonix in the past. Stopped taking after hernia surgery. Doesn't remember if Dexilant helped. Anything she eats causes discomfort. BBQ will cause abdominal pain then can't control her bowels when it happens. Takes Excedrin for migraines. On a mountain dew kick for a month.   Constipation present. Will go several days without BM then will go 7+ times in a day. No rectal bleeding. Nothing OTC. Not taking Tramadol every day.   Past Medical History:  Diagnosis Date  . Anxiety   . Arthritis    left knee  . Asthma   . Chronic abdominal pain   . Chronic headaches   . Depression   . Diabetes mellitus type 2 in obese ([place]) 11/20/2016  . Endometriosis   . HLD (hyperlipidemia) 02/17/2017  . Hypertension   . Mood disorder ([place])   . Obesity   . Ovarian cyst   . Tobacco abuse     Past Surgical History:  Procedure Laterality Date  . CESAREAN  SECTION  2008   [place]  . CHOLECYSTECTOMY N/A 11/25/2014   Procedure: LAPAROSCOPIC CHOLECYSTECTOMY;  Surgeon: Aviva Signs Md, MD;  Location: [facility];  Service: General;  Laterality: N/A;  . ESOPHAGOGASTRODUODENOSCOPY N/A 12/21/2015   Procedure: ESOPHAGOGASTRODUODENOSCOPY (EGD);  Surgeon: Daneil Dolin, MD;  Location: [facility] END[facility] SUITE;  Service: Endoscopy;  Laterality: N/A;  0830  . FEMUR FRACTURE SURGERY Left 1995   tree fell on her  . HERNIA REPAIR    . INCISIONAL HERNIA REPAIR N/A 01/29/2016   Procedure: Fatima Blank HERNIORRHAPHY WITH MESH;  Surgeon: Aviva Signs, MD;  Location: [facility] ORS;  Service: General;  Laterality: N/A;  . INSERTION OF MESH  01/29/2016   Procedure: INSERTION OF MESH;  Surgeon: Aviva Signs, MD;  Location: [facility] ORS;  Service: General;;  . KNEE ARTHROSCOPY Left   . TUBAL LIGATION      Current Outpatient Medications  Medication Sig Dispense Refill  . blood glucose meter kit and supplies KIT Dispense based on patient and insurance preference. Use up to four times daily as directed. (FOR ICD-9 250.00, 250.01). 1 each 0  . DULoxetine (CYMBALTA) 60 MG capsule Take 1 capsule (60 mg total) 2 (two) times daily by mouth. 120 capsule 1  . lisinopril-hydrochlorothiazide (PRINZIDE,ZESTORETIC) 10-12.5 MG tablet Take 1 tablet daily by mouth. 90 tablet 3  . LORazepam (ATIVAN) 1 MG tablet Take 1 tablet (1 mg total) daily as needed by  mouth for anxiety. 30 tablet 1  . lovastatin (MEVACOR) 20 MG tablet Take 1 tablet (20 mg total) by mouth at bedtime. 90 tablet 3  . metaxalone (SKELAXIN) 800 MG tablet Take 1 tablet (800 mg total) 3 (three) times daily by mouth. As needed muscle relaxer 40 tablet 0  . methocarbamol (ROBAXIN) 500 MG tablet Take 1 tablet (500 mg total) every 8 (eight) hours as needed by mouth for muscle spasms. 30 tablet 0  . prazosin (MINIPRESS) 1 MG capsule Take 1 capsule (1 mg total) at bedtime by mouth. 30 capsule 1  . traMADol (ULTRAM) 50 MG tablet Take 1 tablet (50 mg total) every  8 (eight) hours as needed by mouth. 30 tablet 0  . cholecalciferol (VITAMIN D) 1000 units tablet Take 2,000 Units by mouth daily.    . metFORMIN (GLUCOPHAGE XR) 500 MG 24 hr tablet Take 1 tablet (500 mg total) by mouth daily with breakfast. (Patient not taking: Reported on 09/19/2017) 90 tablet 3   No current facility-administered medications for this visit.     Allergies as of 09/19/2017  . (No Known Allergies)    Family History  Problem Relation Age of Onset  . Hypertension Mother   . Hyperlipidemia Mother   . Fibromyalgia Mother   . Other Mother        degenerative disc disease  . Arthritis Mother   . Diabetes Father   . Hypertension Father   . Hyperlipidemia Father   . Heart disease Father 45  . Heart attack Father   . Stroke Father   . Hyperlipidemia Brother   . Hypertension Brother   . Aneurysm Maternal Grandmother        AAA  . Aneurysm Paternal Grandmother        AAA  . Colon cancer Neg Hx   . Inflammatory bowel disease Neg Hx     Social History   Socioeconomic History  . Marital status: Married    Spouse name: Herbie Baltimore  . Number of children: 1  . Years of education: 77  . Highest education level: None  Social Needs  . Financial resource strain: None  . Food insecurity - worry: None  . Food insecurity - inability: None  . Transportation needs - medical: None  . Transportation needs - non-medical: None  Occupational History  . 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 Smoking status: Current Every Day Smoker    Packs/day: 0.50    Years: 11.00    Pack years: 5.50    Types: Cigarettes    Start date: 09/17/2003  . Smokeless tobacco: Never Used  Substance and Sexual Activity  . Alcohol use: No  . Drug use: No  . Sexual activity: Yes    Birth control/protection: Surgical    Comment: BTL  Other Topics Concern  . None  Social History Narrative   Disabled from nursing   Lives at home with husband and  daughter [name]    Review of Systems: Gen: Denies fever, chills, anorexia. Denies fatigue, weakness, weight loss.  CV: Denies chest pain, palpitations, syncope, peripheral edema, and claudication. Resp: Denies dyspnea at rest, cough, wheezing, coughing up blood, and pleurisy. GI: see HPI  Derm: Denies rash, itching, dry skin Psych: Denies depression, anxiety, memory loss, confusion. No homicidal or suicidal ideation.  Heme: see HPI   Physical Exam: BP (!) 174/99   Pulse 98   Temp 98 F (36.7 C) (Oral)  Ht '5\' 5"'$  (1.651 m)   Wt (!) 329 lb 9.6 oz (149.5 kg)   BMI 54.85 kg/m  General:   Alert and oriented. No distress noted. Pleasant and cooperative.  Head:  Normocephalic and atraumatic. Eyes:  Conjuctiva clear without scleral icterus. Mouth:  Oral mucosa pink and moist.  Abdomen:  +BS, soft, mild TTP upper abdomen and LUQ and non-distended. Obese. No rebound or guarding. Unable to appreciate HSM due to large AP diameter.  Msk:  Symmetrical without gross deformities. Normal posture. Extremities:  Without edema. Neurologic:  Alert and  oriented x4 Psych:  Alert and cooperative. Normal mood and affect.

## 2017-09-19 NOTE — Patient Instructions (Addendum)
For abdominal pain and indigestion: start taking Dexilant once daily on an empty stomach 30 minutes before breakfast daily. We have provided samples. Let me know how this works for you. Follow the reflux diet strictly. Stop drinking [employer]:)   For constipation: start taking Linzess 1 capsule 30 minutes before breakfast daily (on an empty stomach). This can cause loose stool for first few days, but should get better. Let us know if it does not.  Please have blood work done.   Avoid excedrin.   We will see you in 2 months!   Food Choices for Gastroesophageal Reflux Disease, Adult When you have gastroesophageal reflux disease (GERD), the foods you eat and your eating habits are very important. Choosing the right foods can help ease your discomfort. What guidelines do I need to follow?  Choose fruits, vegetables, whole grains, and low-fat dairy products.  Choose low-fat meat, fish, and poultry.  Limit fats such as oils, salad dressings, butter, nuts, and avocado.  Keep a food diary. This helps you identify foods that cause symptoms.  Avoid foods that cause symptoms. These may be different for everyone.  Eat small meals often instead of 3 large meals a day.  Eat your meals slowly, in a place where you are relaxed.  Limit fried foods.  Cook foods using methods other than frying.  Avoid drinking alcohol.  Avoid drinking large amounts of liquids with your meals.  Avoid bending over or lying down until 2-3 hours after eating. What foods are not recommended? These are some foods and drinks that may make your symptoms worse: Vegetables Tomatoes. Tomato juice. Tomato and spaghetti sauce. Chili peppers. Onion and garlic. Horseradish. Fruits Oranges, grapefruit, and lemon (fruit and juice). Meats High-fat meats, fish, and poultry. This includes hot dogs, ribs, ham, sausage, salami, and bacon. Dairy Whole milk and chocolate milk. Sour cream. Cream. Butter. Ice cream. Cream  cheese. Drinks Coffee and tea. Bubbly (carbonated) drinks or energy drinks. Condiments Hot sauce. Barbecue sauce. Sweets/Desserts Chocolate and cocoa. Donuts. Peppermint and spearmint. Fats and Oils High-fat foods. This includes [place] fries and potato chips. Other Vinegar. Strong spices. This includes black pepper, white pepper, red pepper, cayenne, curry powder, cloves, ginger, and chili powder. The items listed above may not be a complete list of foods and drinks to avoid. Contact your dietitian for more information. This information is not intended to replace advice given to you by your health care provider. Make sure you discuss any questions you have with your health care provider. Document Released: 03/03/2012 Document Revised: 02/08/2016 Document Reviewed: 07/07/2013 Elsevier Interactive Patient Education  2017 [employer].

## 2017-09-19 NOTE — Assessment & Plan Note (Addendum)
32 year old female with known history of erosive gastropathy, GERD, prior abdominal pain last seen April 2017. Presenting with dyspepsia in setting of Excedrin, poor dietary choices (large amounts of [facility]), and no PPI. Will start Dexilant samples once daily. Avoid Excedrin and any other aspirin products, NSAIDs. Return in 2 months. Will check CBC and CMP today to be thorough. Doubt she has any other acute process going on.

## 2017-09-22 ENCOUNTER — Other Ambulatory Visit: Payer: Self-pay

## 2017-09-22 ENCOUNTER — Ambulatory Visit (INDEPENDENT_AMBULATORY_CARE_PROVIDER_SITE_OTHER): Payer: Medicaid Other

## 2017-09-22 ENCOUNTER — Telehealth: Payer: Self-pay | Admitting: Internal Medicine

## 2017-09-22 ENCOUNTER — Telehealth: Payer: Self-pay | Admitting: Family Medicine

## 2017-09-22 ENCOUNTER — Ambulatory Visit (HOSPITAL_COMMUNITY)
Admission: RE | Admit: 2017-09-22 | Discharge: 2017-09-22 | Disposition: A | Discharge status: Home / Self Care | Payer: Medicaid Other | Source: Ambulatory Visit | Attending: Gastroenterology | Admitting: Gastroenterology

## 2017-09-22 DIAGNOSIS — E279 Disorder of adrenal gland, unspecified: Secondary | ICD-10-CM | POA: Insufficient documentation

## 2017-09-22 DIAGNOSIS — R109 Unspecified abdominal pain: Secondary | ICD-10-CM | POA: Diagnosis not present

## 2017-09-22 DIAGNOSIS — R1084 Generalized abdominal pain: Secondary | ICD-10-CM | POA: Diagnosis not present

## 2017-09-22 DIAGNOSIS — R590 Localized enlarged lymph nodes: Secondary | ICD-10-CM | POA: Insufficient documentation

## 2017-09-22 DIAGNOSIS — K760 Fatty (change of) liver, not elsewhere classified: Secondary | ICD-10-CM | POA: Diagnosis not present

## 2017-09-22 LAB — POCT URINALYSIS DIPSTICK
Bilirubin, UA: NEGATIVE
GLUCOSE UA: 500
Leukocytes, UA: NEGATIVE
Nitrite, UA: NEGATIVE
Spec Grav, UA: >=1.03 — AB (ref 1.010–1.025)
Urobilinogen, UA: 0.2 E.U./dL
pH, UA: 5.5 (ref 5.0–8.0)

## 2017-09-22 LAB — COMPLETE METABOLIC PANEL WITH GFR
AG RATIO: 1.4 (calc) (ref 1.0–2.5)
ALBUMIN MSPROF: 4 g/dL (ref 3.6–5.1)
ALKALINE PHOSPHATASE (APISO): 91 U/L (ref 33–115)
ALT: 28 U/L (ref 6–29)
AST: 25 U/L (ref 10–30)
BUN: 8 mg/dL (ref 7–25)
CO2: 22 mmol/L (ref 20–32)
CREATININE: 0.55 mg/dL (ref 0.50–1.10)
Calcium: 8.8 mg/dL (ref 8.6–10.2)
Chloride: 103 mmol/L (ref 98–110)
GFR, EST NON AFRICAN AMERICAN: 124 mL/min/{1.73_m2} (ref >=60)
GFR, Est African American: 144 mL/min/{1.73_m2} (ref >=60)
GLOBULIN: 2.8 g/dL (ref 1.9–3.7)
Glucose, Bld: 255 mg/dL — ABNORMAL HIGH (ref 65–139)
POTASSIUM: 4.5 mmol/L (ref 3.5–5.3)
Sodium: 134 mmol/L — ABNORMAL LOW (ref 135–146)
Total Bilirubin: 0.4 mg/dL (ref 0.2–1.2)
Total Protein: 6.8 g/dL (ref 6.1–8.1)

## 2017-09-22 LAB — CBC WITH DIFFERENTIAL/PLATELET
BASOS ABS: 65 {cells}/uL (ref 0–200)
Basophils Relative: 0.5 %
EOS ABS: 65 {cells}/uL (ref 15–500)
EOS PCT: 0.5 %
HEMATOCRIT: 39.9 % (ref 35.0–45.0)
HEMOGLOBIN: 13 g/dL (ref 11.7–15.5)
LYMPHS ABS: 2541 {cells}/uL (ref 850–3900)
MCH: 24.5 pg — ABNORMAL LOW (ref 27.0–33.0)
MCHC: 32.6 g/dL (ref 32.0–36.0)
MCV: 75.1 fL — AB (ref 80.0–100.0)
MPV: 10.1 fL (ref 7.5–12.5)
Monocytes Relative: 6.3 %
NEUTROS ABS: 9417 {cells}/uL — AB (ref 1500–7800)
NEUTROS PCT: 73 %
Platelets: 298 10*3/uL (ref 140–400)
RBC: 5.31 10*6/uL — ABNORMAL HIGH (ref 3.80–5.10)
RDW: 15.1 % — ABNORMAL HIGH (ref 11.0–15.0)
Total Lymphocyte: 19.7 %
WBC: 12.9 10*3/uL — ABNORMAL HIGH (ref 3.8–10.8)
WBCMIX: 813 {cells}/uL (ref 200–950)

## 2017-09-22 MED ORDER — IOPAMIDOL (ISOVUE-300) INJECTION 61%
100.0000 mL | Freq: Once | INTRAVENOUS | Status: AC | PRN
  Administered 2017-09-22: 100 mL via INTRAVENOUS

## 2017-09-22 NOTE — Telephone Encounter (Signed)
Recommend to complete labs. I discussed possibly a CT if no improvement.  Let's get stat CT abd/pelvis with contrast. I doubt there is an acute process, but we need to rule this out. Diagnosis: acute on chronic abdominal pain.

## 2017-09-22 NOTE — Progress Notes (Signed)
Pt aware.

## 2017-09-22 NOTE — Telephone Encounter (Signed)
We checked a UA and it is negative.

## 2017-09-22 NOTE — Telephone Encounter (Signed)
Pt called office and was informed she can go now to [facility] radiology for CT.

## 2017-09-22 NOTE — Telephone Encounter (Signed)
Spoke with pt. Pt was seen Friday 09/19/17 and was given samples of Dexilant and Linzess. Pt has started both meds and hasn't had a bowel movement as of yet. Pt is on the way to get her blood work done this morning which was asked on 09/19/17. Pt is having stomach pain/nauea and when asked on a scale 1-10, pt states her pain level is at an 8. Pt also states her reflux is very bad. Pt said her pain went from an aching pain to a stabbing pain.

## 2017-09-22 NOTE — Telephone Encounter (Signed)
Please advise what we ca offer patient?

## 2017-09-22 NOTE — Progress Notes (Signed)
cc'ed to pcp °

## 2017-09-22 NOTE — Progress Notes (Signed)
Please let patient know that her CT did not have any acute findings. She did have a left adrenal nodule that has enlarged, which we can evaluate with further imaging in near future (MRI). We need to increase Linzess dosing to 290 mcg daily. May take additional dose of Miralax daily to BID in addition to Miralax to get things going. Please let her have samples. Progress report in 2-3 days. I'm still awaiting CMP results.

## 2017-09-22 NOTE — Telephone Encounter (Signed)
Patient is requesting an appt today  for possible UTI (I told her she can come in for nurse visit ) she still requested to see dr.nelson for abdominal pain and frequent urination. Cb#: [phone]  There is a workin spot for 4, ok to use ?

## 2017-09-22 NOTE — Telephone Encounter (Signed)
CT abd/pelvis w/contrast approved via [employer]. PA# [id number], 09/22/17-10/22/17. Called [employer]. Pt can go now to [facility] radiology for CT to start drinking contrast, needs to be NPO. Tried to call pt, no answer, LMOVM for her to call office. Tried to call her husband also, no answer.

## 2017-09-22 NOTE — Telephone Encounter (Signed)
Called Sheika, aware,

## 2017-09-22 NOTE — Telephone Encounter (Signed)
Pt was seen on Friday and called this morning saying she has gotten worse. She was seen for having abdominal pain. Please advise and call her at [phone]

## 2017-09-23 ENCOUNTER — Telehealth: Payer: Self-pay | Admitting: Internal Medicine

## 2017-09-23 DIAGNOSIS — Z6841 Body Mass Index (BMI) 40.0 and over, adult: Secondary | ICD-10-CM | POA: Diagnosis not present

## 2017-09-23 DIAGNOSIS — M545 Low back pain: Secondary | ICD-10-CM | POA: Diagnosis not present

## 2017-09-23 DIAGNOSIS — R030 Elevated blood-pressure reading, without diagnosis of hypertension: Secondary | ICD-10-CM | POA: Diagnosis not present

## 2017-09-23 NOTE — Telephone Encounter (Signed)
Noted, discussed with pt 

## 2017-09-23 NOTE — Telephone Encounter (Signed)
PATIENT CALLED INQUIRING ABOUT WHAT WILL BE THE NEXT STEP IN HER CARE AFTER HAVING HER CT SCAN, SHE SAID SHE SAW HER RESULTS ON MY CHART

## 2017-09-25 NOTE — Progress Notes (Signed)
Mild elevation of white count could be related to chronic smoking.

## 2017-10-15 ENCOUNTER — Ambulatory Visit: Payer: Medicaid Other | Admitting: Nurse Practitioner

## 2017-11-17 ENCOUNTER — Encounter: Payer: Self-pay | Admitting: Physical Therapy

## 2017-11-17 ENCOUNTER — Other Ambulatory Visit: Payer: Self-pay

## 2017-11-17 ENCOUNTER — Ambulatory Visit: Payer: Medicaid Other | Attending: Physical Medicine and Rehabilitation | Admitting: Physical Therapy

## 2017-11-17 DIAGNOSIS — M5442 Lumbago with sciatica, left side: Secondary | ICD-10-CM | POA: Diagnosis not present

## 2017-11-17 DIAGNOSIS — R293 Abnormal posture: Secondary | ICD-10-CM | POA: Insufficient documentation
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
## 2017-11-19 ENCOUNTER — Encounter: Payer: Self-pay | Admitting: Gastroenterology

## 2017-11-19 ENCOUNTER — Encounter: Payer: Self-pay | Admitting: Family Medicine

## 2017-11-19 ENCOUNTER — Telehealth: Payer: Self-pay

## 2017-11-19 ENCOUNTER — Telehealth: Payer: Self-pay | Admitting: Gastroenterology

## 2017-11-19 ENCOUNTER — Ambulatory Visit: Payer: Medicaid Other | Admitting: Gastroenterology

## 2017-11-19 NOTE — Telephone Encounter (Signed)
Patient called in stated that she had an MRI completed for Migraines, they advised that she needed to see Neurology but that the referral had to come from her PCP for her insurance to pay.   Referral is Pended for you to sign off.  Thank you!

## 2017-11-19 NOTE — Telephone Encounter (Signed)
PATIENT WAS A NO SHOW AND LETTER SENT  °

## 2017-11-19 NOTE — Telephone Encounter (Signed)
No.  The reason they want a primary care referral, as they want her to see her primary care doctor.  She has not been in for some time.  I am not taking care of her migraines, I have not ordered the MRI for her migraines, and I am not ordering the referral for her migraines until she comes in for an appointment with a copy of her medical care and MRI.

## 2017-11-19 NOTE — Telephone Encounter (Signed)
Called and LVM advising patient of note below and that she needed to make an appointment to be seen to discuss the referral since Dr.Nelson had no followed her for those issues.

## 2017-11-24 ENCOUNTER — Ambulatory Visit: Payer: Medicaid Other | Admitting: Physical Therapy

## 2017-11-24 ENCOUNTER — Encounter: Payer: Self-pay | Admitting: Family Medicine

## 2017-11-25 ENCOUNTER — Encounter: Payer: Self-pay | Admitting: Family Medicine

## 2017-11-25 ENCOUNTER — Ambulatory Visit: Payer: Medicaid Other | Admitting: Family Medicine

## 2017-11-25 VITALS — BP 132/82 | HR 96 | Temp 98.5°F | Ht 65.0 in | Wt 331.2 lb

## 2017-11-25 DIAGNOSIS — Z9119 Patient's noncompliance with other medical treatment and regimen: Secondary | ICD-10-CM

## 2017-11-25 DIAGNOSIS — Z720 Tobacco use: Secondary | ICD-10-CM | POA: Diagnosis not present

## 2017-11-25 DIAGNOSIS — Z91199 Patient's noncompliance with other medical treatment and regimen due to unspecified reason: Secondary | ICD-10-CM

## 2017-11-25 DIAGNOSIS — M542 Cervicalgia: Secondary | ICD-10-CM | POA: Diagnosis not present

## 2017-11-25 DIAGNOSIS — G44201 Tension-type headache, unspecified, intractable: Secondary | ICD-10-CM | POA: Diagnosis not present

## 2017-11-25 MED ORDER — METHOCARBAMOL 750 MG PO TABS: 750.0000 mg | ORAL_TABLET | Freq: Three times a day (TID) | ORAL | 1 refills | Status: DC | PRN

## 2017-11-25 NOTE — Patient Instructions (Addendum)
Add PT to the neck Refer to neurology Take the muscle relaxer at bedtime May continue Excedrin with food as needed for headache pain  Follow up in one month
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
## 2017-11-26 ENCOUNTER — Encounter: Payer: Self-pay | Admitting: Physical Therapy

## 2017-11-26 ENCOUNTER — Ambulatory Visit: Payer: Medicaid Other | Admitting: Physical Therapy

## 2017-11-26 DIAGNOSIS — M5442 Lumbago with sciatica, left side: Secondary | ICD-10-CM | POA: Diagnosis not present

## 2017-11-26 DIAGNOSIS — R293 Abnormal posture: Secondary | ICD-10-CM

## 2017-11-26 NOTE — Therapy (Signed)
[facility] [place], [place], [address] Phone: [phone]   Fax:  [phone]  Physical Therapy Treatment  Patient Details  Name: Patricia Stanton MRN: [id number] Date of Birth: 1986/07/02 Referring Provider: Suella Broad MD   Encounter Date: 11/26/2017  PT End of Session - 11/26/17 0937    Visit Number  2    Number of Visits  15    Date for PT Re-Evaluation  01/12/18    PT Start Time  0842    PT Stop Time  0927    PT Time Calculation (min)  45 min    Activity Tolerance  Patient tolerated treatment well    Behavior During Therapy  [facility] for tasks assessed/performed       Past Medical History:  Diagnosis Date  . Anxiety   . Arthritis    left knee  . Asthma   . Chronic abdominal pain   . Chronic headaches   . Depression   . Diabetes mellitus type 2 in obese ([place]) 11/20/2016  . Endometriosis   . HLD (hyperlipidemia) 02/17/2017  . Hypertension   . Mood disorder ([place])   . Obesity   . Ovarian cyst   . Tobacco abuse     Past Surgical History:  Procedure Laterality Date  . CESAREAN SECTION  2008   [place]  . CHOLECYSTECTOMY N/A 11/25/2014   Procedure: LAPAROSCOPIC CHOLECYSTECTOMY;  Surgeon: Aviva Signs Md, MD;  Location: [facility];  Service: General;  Laterality: N/A;  . ESOPHAGOGASTRODUODENOSCOPY N/A 12/21/2015   Dr. Gala Romney: normal esophagus, non-bleeding erosive gastropathy, normal second portion of duodenum. Reactive gastropathy/chemical gastritis, negative H.pylori  . FEMUR FRACTURE SURGERY Left 1995   tree fell on her  . HERNIA REPAIR    . INCISIONAL HERNIA REPAIR N/A 01/29/2016   Procedure: Fatima Blank HERNIORRHAPHY WITH MESH;  Surgeon: Aviva Signs, MD;  Location: [facility];  Service: General;  Laterality: N/A;  . INSERTION OF MESH  01/29/2016   Procedure: INSERTION OF MESH;  Surgeon: Aviva Signs, MD;  Location: [facility];  Service: General;;  . KNEE ARTHROSCOPY Left   . TUBAL LIGATION      There were no vitals filed for  this visit.  Subjective Assessment - 11/26/17 1003    Subjective  No new complaints.    Pain Score  8     Pain Location  Back    Pain Orientation  Left    Pain Descriptors / Indicators  Stabbing;Throbbing    Pain Type  Acute pain    Pain Onset  More than a month ago                      [facility] Adult PT Treatment/Exercise - 11/26/17 0001      Modalities   Modalities  Electrical Stimulation;Moist Heat;Ultrasound      Moist Heat Therapy   Number Minutes Moist Heat  10 Minutes    Moist Heat Location  -- Low back.      Acupuncturist Location  Left low back.    Electrical Stimulation Action  Pre-mod.    Electrical Stimulation Parameters  [place] at 80-150 Hz x 10 minutes.    Electrical Stimulation Goals  Tone;Pain      Ultrasound   Ultrasound Location  Left low back.    Ultrasound Parameters  1.50 W/CM2 x 12 minutes.    Ultrasound Goals  Pain      Manual Therapy   Manual Therapy  Soft  tissue mobilization    Soft tissue mobilization  STW/M x 12 minutes to left low back including QL release technique.                  PT Long Term Goals - 11/17/17 1012      PT LONG TERM GOAL #1   Title  Independent with a HEP.    Baseline  No knowledge of appropriate ther ex.    Time  8    Period  Weeks    Status  New      PT LONG TERM GOAL #2   Title  Sit 30 minutes with pain not > 3/10.    Baseline  After sitting 20 minutes the patient's pain rises to a 9/10.    Time  8    Period  Weeks    Status  New      PT LONG TERM GOAL #3   Title  Eliminate left LE symptoms.    Baseline  Patient experiences pain and numbness into her left lateral thigh and on occasions to her left foot.    Time  8    Period  Weeks    Status  New      PT LONG TERM GOAL #4   Title  Perform ADL's with pain not > 3/10.    Baseline  ADL performance can produce pain-levels as high as 9/10.    Time  8    Period  Weeks    Status  New             Plan - 11/26/17 1040    Clinical Impression Statement  The patient did well with treatment today.  Her left QL is very taut to palpation.  She felt better after treatment.    Rehab Potential  Excellent    PT Treatment/Interventions  ADLs/Self Care Home Management;Cryotherapy;Electrical Stimulation;Ultrasound;Moist Heat;Therapeutic activities;Therapeutic exercise;Patient/family education;Manual techniques;Dry needling    PT Next Visit Plan  U/s and STW/M to left low back to include QL release technique; HMP and electrical stimulation.  Begin core exercise progression.    Consulted and Agree with Plan of Care  Patient       Patient will benefit from skilled therapeutic intervention in order to improve the following deficits and impairments:  Decreased activity tolerance, Pain, Postural dysfunction, Increased muscle spasms  Visit Diagnosis: Acute left-sided low back pain with left-sided sciatica  Abnormal posture     Problem List Patient Active Problem List   Diagnosis Date Noted  . Noncompliance 06/03/2017  . HLD (hyperlipidemia) 02/17/2017  . Major depressive disorder, recurrent episode, moderate ([place]) 02/03/2017  . Vitamin D deficiency 11/20/2016  . Diabetes mellitus type 2 in obese ([place]) 11/20/2016  . PTSD (post-traumatic stress disorder) 11/19/2016  . Elevated blood-pressure reading without diagnosis of hypertension 11/19/2016  . History of hernia surgery 11/19/2016  . Pulmonary nodule/lesion, solitary 11/19/2016  . Mucosal abnormality of stomach   . Abdominal pain, epigastric 12/19/2015  . Constipation 12/19/2015  . Tobacco abuse 06/06/2013  . Morbid obesity ([place]) 06/06/2013    [place], [place] MPT 11/26/2017, 10:47 AM  [facility] [address] [place], [place], [address] Phone: [phone]   Fax:  [phone]  Name: Timea Breed MRN: [id number] Date of Birth: [birth year]

## 2017-12-02 ENCOUNTER — Encounter: Payer: Self-pay | Admitting: Physical Therapy

## 2017-12-02 ENCOUNTER — Ambulatory Visit: Payer: Medicaid Other | Admitting: Physical Therapy

## 2017-12-02 DIAGNOSIS — M5442 Lumbago with sciatica, left side: Secondary | ICD-10-CM | POA: Diagnosis not present

## 2017-12-02 DIAGNOSIS — R293 Abnormal posture: Secondary | ICD-10-CM

## 2017-12-02 NOTE — Therapy (Signed)
[facility] [place], [place], [address] Phone: [phone]   Fax:  [phone]  Physical Therapy Treatment  Patient Details  Name: Patricia Stanton MRN: [id number] Date of Birth: March 12, 1986 Referring Provider: Suella Broad MD   Encounter Date: 12/02/2017  PT End of Session - 12/02/17 1126    Visit Number  3    Number of Visits  15    Date for PT Re-Evaluation  01/12/18    PT Start Time  7628    PT Stop Time  0944    PT Time Calculation (min)  49 min       Past Medical History:  Diagnosis Date  . Anxiety   . Arthritis    left knee  . Asthma   . Chronic abdominal pain   . Chronic headaches   . Depression   . Diabetes mellitus type 2 in obese ([place]) 11/20/2016  . Endometriosis   . HLD (hyperlipidemia) 02/17/2017  . Hypertension   . Mood disorder ([place])   . Obesity   . Ovarian cyst   . Tobacco abuse     Past Surgical History:  Procedure Laterality Date  . CESAREAN SECTION  2008   [place]  . CHOLECYSTECTOMY N/A 11/25/2014   Procedure: LAPAROSCOPIC CHOLECYSTECTOMY;  Surgeon: Aviva Signs Md, MD;  Location: [facility];  Service: General;  Laterality: N/A;  . ESOPHAGOGASTRODUODENOSCOPY N/A 12/21/2015   Dr. Gala Romney: normal esophagus, non-bleeding erosive gastropathy, normal second portion of duodenum. Reactive gastropathy/chemical gastritis, negative H.pylori  . FEMUR FRACTURE SURGERY Left 1995   tree fell on her  . HERNIA REPAIR    . INCISIONAL HERNIA REPAIR N/A 01/29/2016   Procedure: Fatima Blank HERNIORRHAPHY WITH MESH;  Surgeon: Aviva Signs, MD;  Location: [facility];  Service: General;  Laterality: N/A;  . INSERTION OF MESH  01/29/2016   Procedure: INSERTION OF MESH;  Surgeon: Aviva Signs, MD;  Location: [facility];  Service: General;;  . KNEE ARTHROSCOPY Left   . TUBAL LIGATION      There were no vitals filed for this visit.  Subjective Assessment - 12/02/17 1130    Subjective  Treatment helped but my pain is a 7/10 today.    Pain Score  7     Pain Location  Back    Pain Orientation  Left    Pain Descriptors / Indicators  Stabbing;Throbbing    Pain Type  Acute pain    Pain Onset  More than a month ago                      [facility] Adult PT Treatment/Exercise - 12/02/17 0001      Exercises   Exercises  Knee/Hip      Knee/Hip Exercises: Aerobic   Nustep  Level 3 x 15 minutes.      Modalities   Modalities  Electrical Stimulation;Moist Research officer, political party Location  Left low back    Electrical Stimulation Action  Pre-mod    Electrical Stimulation Parameters  Constant at 80-150 Hz x 20 minutes.    Electrical Stimulation Goals  Tone;Pain      Manual Therapy   Soft tissue mobilization  Left QL release including ischemic release technique x 8 minutes.                  PT Long Term Goals - 11/17/17 1012      PT LONG TERM GOAL #1   Title  Independent with a HEP.    Baseline  No knowledge of appropriate ther ex.    Time  8    Period  Weeks    Status  New      PT LONG TERM GOAL #2   Title  Sit 30 minutes with pain not > 3/10.    Baseline  After sitting 20 minutes the patient's pain rises to a 9/10.    Time  8    Period  Weeks    Status  New      PT LONG TERM GOAL #3   Title  Eliminate left LE symptoms.    Baseline  Patient experiences pain and numbness into her left lateral thigh and on occasions to her left foot.    Time  8    Period  Weeks    Status  New      PT LONG TERM GOAL #4   Title  Perform ADL's with pain not > 3/10.    Baseline  ADL performance can produce pain-levels as high as 9/10.    Time  8    Period  Weeks    Status  New            Plan - 12/02/17 1205    Clinical Impression Statement  Good response to ther ex today.  Left QL continues to have a lot of tone.    Rehab Potential  Excellent    PT Treatment/Interventions  ADLs/Self Care Home Management;Cryotherapy;Electrical Stimulation;Ultrasound;Moist  Heat;Therapeutic activities;Therapeutic exercise;Patient/family education;Manual techniques;Dry needling    PT Next Visit Plan  U/s and STW/M to left low back to include QL release technique; HMP and electrical stimulation.  Begin core exercise progression.    Consulted and Agree with Plan of Care  Patient       Patient will benefit from skilled therapeutic intervention in order to improve the following deficits and impairments:  Decreased activity tolerance, Pain, Postural dysfunction, Increased muscle spasms  Visit Diagnosis: Acute left-sided low back pain with left-sided sciatica  Abnormal posture     Problem List Patient Active Problem List   Diagnosis Date Noted  . Noncompliance 06/03/2017  . HLD (hyperlipidemia) 02/17/2017  . Major depressive disorder, recurrent episode, moderate ([place]) 02/03/2017  . Vitamin D deficiency 11/20/2016  . Diabetes mellitus type 2 in obese ([place]) 11/20/2016  . PTSD (post-traumatic stress disorder) 11/19/2016  . Elevated blood-pressure reading without diagnosis of hypertension 11/19/2016  . History of hernia surgery 11/19/2016  . Pulmonary nodule/lesion, solitary 11/19/2016  . Mucosal abnormality of stomach   . Abdominal pain, epigastric 12/19/2015  . Constipation 12/19/2015  . Tobacco abuse 06/06/2013  . Morbid obesity ([place]) 06/06/2013    APP[place], [place] MPT 12/02/2017, 12:09 PM  [facility] [address] [place], [place], [address] Phone: [phone]   Fax:  [phone]  Name: Patricia Stanton MRN: [id number] Date of Birth: Feb 13, 1986

## 2017-12-09 ENCOUNTER — Encounter: Payer: Self-pay | Admitting: Physical Therapy

## 2017-12-12 ENCOUNTER — Ambulatory Visit: Payer: Medicaid Other | Admitting: Physical Therapy

## 2017-12-12 ENCOUNTER — Encounter: Payer: Self-pay | Admitting: Physical Therapy

## 2017-12-12 DIAGNOSIS — M5442 Lumbago with sciatica, left side: Secondary | ICD-10-CM

## 2017-12-12 DIAGNOSIS — R293 Abnormal posture: Secondary | ICD-10-CM

## 2017-12-12 NOTE — Therapy (Signed)
[facility] [place], [place], [address] Phone: [phone]   Fax:  [phone]  Physical Therapy Treatment  Patient Details  Name: Patricia Stanton MRN: [id number] Date of Birth: Mar 22, 1986 Referring Provider: Suella Broad MD   Encounter Date: 12/12/2017    Past Medical History:  Diagnosis Date  . Anxiety   . Arthritis    left knee  . Asthma   . Chronic abdominal pain   . Chronic headaches   . Depression   . Diabetes mellitus type 2 in obese ([place]) 11/20/2016  . Endometriosis   . HLD (hyperlipidemia) 02/17/2017  . Hypertension   . Mood disorder ([place])   . Obesity   . Ovarian cyst   . Tobacco abuse     Past Surgical History:  Procedure Laterality Date  . CESAREAN SECTION  2008   [place]  . CHOLECYSTECTOMY N/A 11/25/2014   Procedure: LAPAROSCOPIC CHOLECYSTECTOMY;  Surgeon: Aviva Signs Md, MD;  Location: [facility];  Service: General;  Laterality: N/A;  . ESOPHAGOGASTRODUODENOSCOPY N/A 12/21/2015   Dr. Gala Romney: normal esophagus, non-bleeding erosive gastropathy, normal second portion of duodenum. Reactive gastropathy/chemical gastritis, negative H.pylori  . FEMUR FRACTURE SURGERY Left 1995   tree fell on her  . HERNIA REPAIR    . INCISIONAL HERNIA REPAIR N/A 01/29/2016   Procedure: Fatima Blank HERNIORRHAPHY WITH MESH;  Surgeon: Aviva Signs, MD;  Location: [facility];  Service: General;  Laterality: N/A;  . INSERTION OF MESH  01/29/2016   Procedure: INSERTION OF MESH;  Surgeon: Aviva Signs, MD;  Location: [facility];  Service: General;;  . KNEE ARTHROSCOPY Left   . TUBAL LIGATION      There were no vitals filed for this visit.             No data recorded                    PT Long Term Goals - 11/17/17 1012      PT LONG TERM GOAL #1   Title  Independent with a HEP.    Baseline  No knowledge of appropriate ther ex.    Time  8    Period  Weeks    Status  New      PT LONG TERM GOAL #2   Title  Sit  30 minutes with pain not > 3/10.    Baseline  After sitting 20 minutes the patient's pain rises to a 9/10.    Time  8    Period  Weeks    Status  New      PT LONG TERM GOAL #3   Title  Eliminate left LE symptoms.    Baseline  Patient experiences pain and numbness into her left lateral thigh and on occasions to her left foot.    Time  8    Period  Weeks    Status  New      PT LONG TERM GOAL #4   Title  Perform ADL's with pain not > 3/10.    Baseline  ADL performance can produce pain-levels as high as 9/10.    Time  8    Period  Weeks    Status  New       Treatment:   HMP and e'stinm to low back x 15 minutes f/b Combo e'stim/U/S at 1.50 W/CM2 x 8 minutes f/b STW/M x 8 minutes.       Patient will benefit from skilled therapeutic intervention in order to improve the  following deficits and impairments:  Decreased activity tolerance, Pain, Postural dysfunction, Increased muscle spasms  Visit Diagnosis: Acute left-sided low back pain with left-sided sciatica  Abnormal posture     Problem List Patient Active Problem List   Diagnosis Date Noted  . Noncompliance 06/03/2017  . HLD (hyperlipidemia) 02/17/2017  . Major depressive disorder, recurrent episode, moderate ([place]) 02/03/2017  . Vitamin D deficiency 11/20/2016  . Diabetes mellitus type 2 in obese ([place]) 11/20/2016  . PTSD (post-traumatic stress disorder) 11/19/2016  . Elevated blood-pressure reading without diagnosis of hypertension 11/19/2016  . History of hernia surgery 11/19/2016  . Pulmonary nodule/lesion, solitary 11/19/2016  . Mucosal abnormality of stomach   . Abdominal pain, epigastric 12/19/2015  . Constipation 12/19/2015  . Tobacco abuse 06/06/2013  . Morbid obesity ([place]) 06/06/2013    Brielynn Sekula, [place] 12/15/2017, 8:35 AM  [facility] [address] [place], [place], [address] Phone: [phone]   Fax:  [phone]  Name: Patricia Stanton MRN: [id number] Date  of Birth: 03-16-1986

## 2017-12-26 ENCOUNTER — Ambulatory Visit: Payer: Self-pay | Admitting: Family Medicine

## 2017-12-29 ENCOUNTER — Encounter: Payer: Self-pay | Admitting: Physical Therapy

## 2017-12-29 ENCOUNTER — Encounter: Payer: Self-pay | Admitting: Pediatrics

## 2017-12-29 ENCOUNTER — Ambulatory Visit: Payer: Medicaid Other | Admitting: Pediatrics

## 2017-12-29 VITALS — BP 133/87 | HR 92 | Temp 98.5°F | Ht 65.0 in | Wt 330.6 lb

## 2017-12-29 DIAGNOSIS — E1169 Type 2 diabetes mellitus with other specified complication: Secondary | ICD-10-CM | POA: Diagnosis not present

## 2017-12-29 DIAGNOSIS — I10 Essential (primary) hypertension: Secondary | ICD-10-CM | POA: Diagnosis not present

## 2017-12-29 DIAGNOSIS — E669 Obesity, unspecified: Secondary | ICD-10-CM | POA: Diagnosis not present

## 2017-12-29 DIAGNOSIS — Z6841 Body Mass Index (BMI) 40.0 and over, adult: Secondary | ICD-10-CM

## 2017-12-29 LAB — BAYER DCA HB A1C WAIVED: HB A1C (BAYER DCA - WAIVED): 8.4 % — ABNORMAL HIGH (ref <7.0)
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
## 2017-12-30 LAB — CMP14+EGFR
ALK PHOS: 103 IU/L (ref 39–117)
ALT: 36 IU/L — AB (ref 0–32)
AST: 36 IU/L (ref 0–40)
Albumin/Globulin Ratio: 1.5 (ref 1.2–2.2)
Albumin: 4.2 g/dL (ref 3.5–5.5)
BUN/Creatinine Ratio: 11 (ref 9–23)
BUN: 6 mg/dL (ref 6–20)
Bilirubin Total: 0.3 mg/dL (ref 0.0–1.2)
CHLORIDE: 102 mmol/L (ref 96–106)
CO2: 17 mmol/L — ABNORMAL LOW (ref 20–29)
Calcium: 9.1 mg/dL (ref 8.7–10.2)
Creatinine, Ser: 0.54 mg/dL — ABNORMAL LOW (ref 0.57–1.00)
GFR calc Af Amer: 144 mL/min/{1.73_m2} (ref >59)
GFR calc non Af Amer: 125 mL/min/{1.73_m2} (ref >59)
GLUCOSE: 206 mg/dL — AB (ref 65–99)
Globulin, Total: 2.8 g/dL (ref 1.5–4.5)
Potassium: 4.8 mmol/L (ref 3.5–5.2)
Sodium: 137 mmol/L (ref 134–144)
Total Protein: 7 g/dL (ref 6.0–8.5)

## 2017-12-30 LAB — LIPID PANEL
CHOLESTEROL TOTAL: 213 mg/dL — AB (ref 100–199)
Chol/HDL Ratio: 5.6 ratio — ABNORMAL HIGH (ref 0.0–4.4)
HDL: 38 mg/dL — AB (ref >39)
LDL Calculated: 147 mg/dL — ABNORMAL HIGH (ref 0–99)
Triglycerides: 142 mg/dL (ref 0–149)
VLDL CHOLESTEROL CAL: 28 mg/dL (ref 5–40)

## 2017-12-30 LAB — TSH: TSH: 1.63 u[IU]/mL (ref 0.450–4.500)

## 2017-12-30 LAB — MICROALBUMIN / CREATININE URINE RATIO
Creatinine, Urine: 159.8 mg/dL
Microalb/Creat Ratio: 25 mg/g creat (ref 0.0–30.0)
Microalbumin, Urine: 40 ug/mL

## 2018-01-01 ENCOUNTER — Other Ambulatory Visit: Payer: Self-pay | Admitting: Pediatrics

## 2018-01-14 ENCOUNTER — Telehealth: Payer: Self-pay | Admitting: Pediatrics

## 2018-01-14 NOTE — Telephone Encounter (Signed)
Called back, no answer, left message that I will call back.

## 2018-01-14 NOTE — Telephone Encounter (Signed)
Spoke with pt regarding lab results Pt states she can not tolerated Metformin due to nausea Pt would like to speak with Dr Evette Doffing

## 2018-01-17 NOTE — Telephone Encounter (Signed)
See result note.  Still not able to contact patient after multiple times of trying.

## 2018-01-22 ENCOUNTER — Ambulatory Visit: Payer: Self-pay | Admitting: Registered"

## 2018-02-02 ENCOUNTER — Ambulatory Visit: Payer: Medicaid Other | Admitting: Neurology

## 2018-02-02 ENCOUNTER — Telehealth: Payer: Self-pay | Admitting: *Deleted

## 2018-02-02 NOTE — Telephone Encounter (Signed)
Patient no showed new pt appt on 02/02/2018 @ 09:00.

## 2018-02-03 ENCOUNTER — Encounter: Payer: Self-pay | Admitting: Neurology

## 2018-02-18 DIAGNOSIS — H16223 Keratoconjunctivitis sicca, not specified as Sjogren's, bilateral: Secondary | ICD-10-CM | POA: Diagnosis not present

## 2018-02-18 DIAGNOSIS — H40033 Anatomical narrow angle, bilateral: Secondary | ICD-10-CM | POA: Diagnosis not present

## 2018-02-22 ENCOUNTER — Other Ambulatory Visit: Payer: Self-pay | Admitting: Family Medicine

## 2018-02-23 ENCOUNTER — Ambulatory Visit: Payer: Medicaid Other | Admitting: Pediatrics

## 2018-02-23 ENCOUNTER — Telehealth: Payer: Self-pay | Admitting: Pediatrics

## 2018-02-23 MED ORDER — METFORMIN HCL ER 500 MG PO TB24: 500.0000 mg | ORAL_TABLET | Freq: Every day | ORAL | 0 refills | Status: DC

## 2018-02-23 NOTE — Telephone Encounter (Signed)
Patient aware and apt made 

## 2018-02-23 NOTE — Telephone Encounter (Signed)
Patient states she would like to go back on her 24 hr Metformin 500mg . She has been taking it x 1 week and requesting a refill sent to [facility] in [place]. Please advise and send if approved.

## 2018-02-23 NOTE — Telephone Encounter (Signed)
I sent in 30 days of metformin.  She missed her appointment with me this morning.  Can you call to schedule an appointment with me?  She must be seen for further refills.

## 2018-02-23 NOTE — Telephone Encounter (Signed)
Call patient per below?

## 2018-03-26 ENCOUNTER — Encounter: Payer: Self-pay | Admitting: Pediatrics

## 2018-03-26 ENCOUNTER — Ambulatory Visit: Payer: Medicaid Other | Admitting: Pediatrics

## 2018-03-26 VITALS — BP 135/87 | HR 106 | Temp 97.7°F | Ht 65.0 in | Wt 322.0 lb

## 2018-03-26 DIAGNOSIS — I10 Essential (primary) hypertension: Secondary | ICD-10-CM | POA: Diagnosis not present

## 2018-03-26 DIAGNOSIS — E119 Type 2 diabetes mellitus without complications: Secondary | ICD-10-CM | POA: Diagnosis not present

## 2018-03-26 DIAGNOSIS — E785 Hyperlipidemia, unspecified: Secondary | ICD-10-CM

## 2018-03-26 DIAGNOSIS — G8929 Other chronic pain: Secondary | ICD-10-CM | POA: Diagnosis not present

## 2018-03-26 DIAGNOSIS — M545 Low back pain, unspecified: Secondary | ICD-10-CM

## 2018-03-26 DIAGNOSIS — E1169 Type 2 diabetes mellitus with other specified complication: Secondary | ICD-10-CM

## 2018-03-26 LAB — BAYER DCA HB A1C WAIVED: HB A1C: 8.7 % — AB (ref <7.0)

## 2018-03-26 MED ORDER — METHOCARBAMOL 750 MG PO TABS
750.0000 mg | ORAL_TABLET | Freq: Three times a day (TID) | ORAL | 1 refills | Status: DC | PRN
Start: 2018-03-26 — End: 2018-03-27

## 2018-03-26 MED ORDER — METFORMIN HCL ER 500 MG PO TB24: 1000.0000 mg | ORAL_TABLET | Freq: Two times a day (BID) | ORAL | 2 refills | Status: DC

## 2018-03-26 MED ORDER — PRAVASTATIN SODIUM 20 MG PO TABS: 20.0000 mg | ORAL_TABLET | Freq: Every day | ORAL | 3 refills | Status: DC

## 2018-03-26 NOTE — Progress Notes (Signed)
  Subjective:   Patient ID: Patricia Stanton, female    DOB: 08-23-1986, 32 y.o.   MRN: [id number] CC: Follow-up Diabetes HPI: Patricia Stanton is a 32 y.o. female   Diabetes: She is been tolerating metformin well, just recently started.  Low back pain: She takes methocarbamol when she needs it.  Usually helps at night, not taking it every day no more than once a day.  Hyperlipidemia: Tolerating statin well.  Hypertension: Taking medicine regularly.  No chest pain or chest pressure.  She has had a tubal ligation.  Relevant past medical, surgical, family and social history reviewed. Allergies and medications reviewed and updated. Social History   Tobacco Use  Smoking Status Current Every Day Smoker  . Packs/day: 0.50  . Years: 11.00  . Pack years: 5.50  . Types: Cigarettes  . Start date: 09/17/2003  Smokeless Tobacco Never Used   ROS: Per HPI   Objective:    BP 135/87   Pulse (!) 106   Temp 97.7 F (36.5 C)   Ht 5\' 5"  (1.651 m)   Wt (!) 322 lb (146.1 kg)   BMI 53.58 kg/m   Wt Readings from Last 3 Encounters:  03/26/18 (!) 322 lb (146.1 kg)  12/29/17 (!) 330 lb 9.6 oz (150 kg)  11/25/17 (!) 331 lb 4 oz (150.3 kg)    Gen: NAD, alert, cooperative with exam, NCAT EYES: EOMI, no conjunctival injection, or no icterus ENT:  TMs pearly gray b/l, OP without erythema LYMPH: no cervical LAD CV: NRRR, normal S1/S2, no murmur, distal pulses 2+ b/l Resp: CTABL, no wheezes, normal WOB Abd: +BS, soft, NTND. Ext: No edema, warm Neuro: Alert and oriented MSK: normal muscle bulk  Assessment & Plan:  Patricia Stanton was seen today for follow-up multiple medical problems.  Diagnoses and all orders for this visit:  Diabetes mellitus without complication ([place]) Uncontrolled.  Increase metformin.  Decrease sugar intake.  Continue regular exercise. -     Bayer DCA Hb A1c Waived -     metFORMIN (GLUCOPHAGE XR) 500 MG 24 hr tablet; Take 2 tablets (1,000 mg total) by mouth 2 (two) times daily. -      Microalbumin / creatinine urine ratio  Chronic left-sided low back pain without sciatica Stable, continue below.  Not taking other muscle relaxer on her medicine list -     methocarbamol (ROBAXIN) 750 MG tablet; Take 1 tablet (750 mg total) by mouth every 8 (eight) hours as needed for muscle spasms. Take at bedtime  Hyperlipidemia associated with type 2 diabetes mellitus ([place]) Start below. -     pravastatin (PRAVACHOL) 20 MG tablet; Take 1 tablet (20 mg total) by mouth daily.  Essential hypertension Slightly elevated today.  Check at home, me know if regularly elevated.  Continue current medicines.  Follow up plan: Return in about 3 months (around 06/26/2018). Assunta Found, MD [place]

## 2018-03-26 NOTE — Patient Instructions (Addendum)
Increase to 2 tabs metformin in the morning for 2 weeks. Then increase to 2 tabs in the morning 1 tab in the evening. Then up to 2 tabs twice a day.  Check blood pressures at home. Let me know if top number regularly >140 or bottom >90

## 2018-03-27 ENCOUNTER — Telehealth: Payer: Self-pay | Admitting: *Deleted

## 2018-03-27 DIAGNOSIS — M545 Low back pain: Principal | ICD-10-CM

## 2018-03-27 DIAGNOSIS — G8929 Other chronic pain: Secondary | ICD-10-CM

## 2018-03-27 LAB — MICROALBUMIN / CREATININE URINE RATIO
CREATININE, UR: 195.1 mg/dL
MICROALB/CREAT RATIO: 30.1 mg/g{creat} — AB (ref 0.0–30.0)
MICROALBUM., U, RANDOM: 58.7 ug/mL

## 2018-03-27 MED ORDER — METHOCARBAMOL 750 MG PO TABS
750.0000 mg | ORAL_TABLET | Freq: Three times a day (TID) | ORAL | 1 refills | Status: DC | PRN
Start: 2018-03-27 — End: 2018-03-27

## 2018-03-27 MED ORDER — METHOCARBAMOL 750 MG PO TABS: 750.0000 mg | ORAL_TABLET | Freq: Three times a day (TID) | ORAL | 1 refills | Status: DC | PRN

## 2018-03-27 NOTE — Telephone Encounter (Signed)
Sent in.  Take every 8 hours as needed.

## 2018-03-27 NOTE — Telephone Encounter (Signed)
Patient seen yesterday Robaxin has two directions Please clarify and resend to [facility]

## 2018-04-06 ENCOUNTER — Telehealth: Payer: Self-pay | Admitting: Pediatrics

## 2018-04-06 NOTE — Telephone Encounter (Signed)
Pt aware of labs  

## 2018-05-06 DIAGNOSIS — M5136 Other intervertebral disc degeneration, lumbar region: Secondary | ICD-10-CM | POA: Diagnosis not present

## 2018-06-26 ENCOUNTER — Encounter: Payer: Self-pay | Admitting: Pediatrics

## 2018-06-26 ENCOUNTER — Ambulatory Visit: Payer: Medicaid Other | Admitting: Pediatrics

## 2018-06-26 VITALS — BP 138/86 | HR 104 | Temp 98.5°F | Ht 65.0 in | Wt 322.4 lb

## 2018-06-26 DIAGNOSIS — M545 Low back pain, unspecified: Secondary | ICD-10-CM

## 2018-06-26 DIAGNOSIS — F419 Anxiety disorder, unspecified: Secondary | ICD-10-CM | POA: Diagnosis not present

## 2018-06-26 DIAGNOSIS — E1169 Type 2 diabetes mellitus with other specified complication: Secondary | ICD-10-CM | POA: Diagnosis not present

## 2018-06-26 DIAGNOSIS — E669 Obesity, unspecified: Secondary | ICD-10-CM | POA: Diagnosis not present

## 2018-06-26 DIAGNOSIS — E785 Hyperlipidemia, unspecified: Secondary | ICD-10-CM

## 2018-06-26 DIAGNOSIS — E119 Type 2 diabetes mellitus without complications: Secondary | ICD-10-CM

## 2018-06-26 DIAGNOSIS — G8929 Other chronic pain: Secondary | ICD-10-CM | POA: Diagnosis not present

## 2018-06-26 DIAGNOSIS — Z6841 Body Mass Index (BMI) 40.0 and over, adult: Secondary | ICD-10-CM

## 2018-06-26 DIAGNOSIS — I10 Essential (primary) hypertension: Secondary | ICD-10-CM

## 2018-06-26 DIAGNOSIS — Z23 Encounter for immunization: Secondary | ICD-10-CM

## 2018-06-26 LAB — CMP14+EGFR
ALK PHOS: 105 IU/L (ref 39–117)
ALT: 35 IU/L — AB (ref 0–32)
AST: 23 IU/L (ref 0–40)
Albumin/Globulin Ratio: 1.8 (ref 1.2–2.2)
Albumin: 4.2 g/dL (ref 3.5–5.5)
BUN / CREAT RATIO: 18 (ref 9–23)
BUN: 9 mg/dL (ref 6–20)
CHLORIDE: 100 mmol/L (ref 96–106)
CO2: 19 mmol/L — AB (ref 20–29)
CREATININE: 0.51 mg/dL — AB (ref 0.57–1.00)
Calcium: 8.9 mg/dL (ref 8.7–10.2)
GFR calc Af Amer: 146 mL/min/{1.73_m2} (ref >59)
GFR calc non Af Amer: 127 mL/min/{1.73_m2} (ref >59)
GLUCOSE: 245 mg/dL — AB (ref 65–99)
Globulin, Total: 2.4 g/dL (ref 1.5–4.5)
Potassium: 4.7 mmol/L (ref 3.5–5.2)
Sodium: 135 mmol/L (ref 134–144)
Total Protein: 6.6 g/dL (ref 6.0–8.5)

## 2018-06-26 LAB — BAYER DCA HB A1C WAIVED: HB A1C: 8.8 % — AB (ref <7.0)

## 2018-06-26 MED ORDER — EMPAGLIFLOZIN 10 MG PO TABS: 10.0000 mg | ORAL_TABLET | Freq: Every day | ORAL | 4 refills | Status: DC

## 2018-06-26 MED ORDER — PRAVASTATIN SODIUM 20 MG PO TABS: 20.0000 mg | ORAL_TABLET | Freq: Every day | ORAL | 3 refills | Status: DC

## 2018-06-26 MED ORDER — METFORMIN HCL ER 500 MG PO TB24: 1000.0000 mg | ORAL_TABLET | Freq: Two times a day (BID) | ORAL | 1 refills | Status: DC

## 2018-06-26 MED ORDER — METHOCARBAMOL 750 MG PO TABS
750.0000 mg | ORAL_TABLET | Freq: Three times a day (TID) | ORAL | 2 refills | Status: DC | PRN
Start: 2018-06-26 — End: 2018-11-17

## 2018-06-26 MED ORDER — LISINOPRIL-HYDROCHLOROTHIAZIDE 10-12.5 MG PO TABS: 1.0000 | ORAL_TABLET | Freq: Every day | ORAL | 1 refills | Status: DC

## 2018-06-26 MED ORDER — DULOXETINE HCL 60 MG PO CPEP: 60.0000 mg | ORAL_CAPSULE | Freq: Two times a day (BID) | ORAL | 1 refills | Status: DC

## 2018-06-26 NOTE — Progress Notes (Signed)
Subjective:   Patient ID: Rosemarie Hopman, female    DOB: 01/14/1986, 32 y.o.   MRN: [id number] CC: Medical Management of Chronic Issues  HPI: Estephani Pifer is a 32 y.o. female   Diabetes: Trying to avoid sugary foods, occasional sweet tea, chocolate or other desserts.  Also drinking water.  Taking metformin 500 mg twice a day.  Hypertension: Ran out of her medicine 1 to 2 weeks ago.  Has been tolerating medicine well.  Has had increased swelling in her hands over the last couple weeks.  Hyperlipidemia: Tolerating statin, taking regularly.  Chronic low back pain: Takes Robaxin about 3 times a week with good improvement in symptoms.  Anxiety: Symptoms well controlled.  Taking Cymbalta twice a day.  Elevated BMI: Would like to start exercising regularly, looking forward to fitness being in the area.  Relevant past medical, surgical, family and social history reviewed. Allergies and medications reviewed and updated. Social History   Tobacco Use  Smoking Status Current Every Day Smoker  . Packs/day: 0.50  . Years: 11.00  . Pack years: 5.50  . Types: Cigarettes  . Start date: 09/17/2003  Smokeless Tobacco Never Used   ROS: Per HPI   Objective:    BP 138/86   Pulse (!) 104   Temp 98.5 F (36.9 C) (Oral)   Ht 5' 5" (1.651 m)   Wt (!) 322 lb 6.4 oz (146.2 kg)   BMI 53.65 kg/m   Wt Readings from Last 3 Encounters:  06/26/18 (!) 322 lb 6.4 oz (146.2 kg)  03/26/18 (!) 322 lb (146.1 kg)  12/29/17 (!) 330 lb 9.6 oz (150 kg)    Gen: NAD, alert, cooperative with exam, NCAT EYES: EOMI, no conjunctival injection, or no icterus CV: NRRR, normal S1/S2, no murmur, distal pulses 2+ b/l Resp: CTABL, no wheezes, normal WOB Abd: +BS, soft, NTND. no guarding or organomegaly Ext: No edema lower extremities, slightly puffy hands bilaterally.  No synovitis or joint swelling. Neuro: Alert and oriented, strength equal b/l UE and LE, coordination grossly normal MSK: normal muscle  bulk  Assessment & Plan:  Lilyanah was seen today for medical management of chronic issues.  Diagnoses and all orders for this visit:  Diabetes mellitus without complication (HCC) A1c is elevated, 8.8.  At last visit was 8.7.  Weight has been stable.  Discussed goals, 5 pound weight loss over next 3 months.  Increase physical activity, okay to start low with 5 to 10 minutes of walking daily, increasing as able.  Will increase metformin 2000 mg twice daily, start Jardiance. -     CMP14+EGFR -     Microalbumin / creatinine urine ratio -     Bayer DCA Hb A1c Waived -     metFORMIN (GLUCOPHAGE XR) 500 MG 24 hr tablet; Take 2 tablets (1,000 mg total) by mouth 2 (two) times daily.  Hyperlipidemia associated with type 2 diabetes mellitus (HCC) Stable, continue pravastatin -     CMP14+EGFR -     Microalbumin / creatinine urine ratio -     Bayer DCA Hb A1c Waived -     pravastatin (PRAVACHOL) 20 MG tablet; Take 1 tablet (20 mg total) by mouth daily.  Essential hypertension Slightly elevated today, has been off of blood pressure medicine.  Restart blood pressure medicine.  Continues to have leg cramps, may need repeat blood draw to make sure potassium level okay. -     CMP14+EGFR -     Microalbumin / creatinine urine ratio -       Bayer DCA Hb A1c Waived -     lisinopril-hydrochlorothiazide (PRINZIDE,ZESTORETIC) 10-12.5 MG tablet; Take 1 tablet by mouth daily.  Chronic left-sided low back pain without sciatica Stable, continue below his knee.  Does not cause sleepiness.  Be careful about taking and driving. -     methocarbamol (ROBAXIN) 750 MG tablet; Take 1 tablet (750 mg total) by mouth every 8 (eight) hours as needed for muscle spasms.  Anxiety Stable, continue below -     DULoxetine (CYMBALTA) 60 MG capsule; Take 1 capsule (60 mg total) by mouth 2 (two) times daily.  Elevated BMI:  Lifestyle changes as above, goal 5 pound weight loss before next visit.  Follow up plan: Return in about  3 months (around 09/26/2018). Assunta Found, MD [place]

## 2018-06-26 NOTE — Addendum Note (Signed)
Addended by: Wardell Heath on: 06/26/2018 11:53 AM   Modules accepted: Orders

## 2018-06-27 LAB — MICROALBUMIN / CREATININE URINE RATIO
Creatinine, Urine: 161.3 mg/dL
Microalb/Creat Ratio: 9.6 mg/g{creat} (ref 0.0–30.0)
Microalbumin, Urine: 15.5 ug/mL

## 2018-07-31 ENCOUNTER — Telehealth: Payer: Self-pay | Admitting: Pediatrics

## 2018-07-31 ENCOUNTER — Ambulatory Visit: Payer: Medicaid Other | Admitting: Pediatrics

## 2018-07-31 ENCOUNTER — Encounter: Payer: Self-pay | Admitting: Pediatrics

## 2018-07-31 VITALS — BP 136/88 | HR 98 | Temp 98.6°F | Resp 22 | Ht 65.0 in | Wt 332.4 lb

## 2018-07-31 DIAGNOSIS — Z720 Tobacco use: Secondary | ICD-10-CM

## 2018-07-31 DIAGNOSIS — J441 Chronic obstructive pulmonary disease with (acute) exacerbation: Secondary | ICD-10-CM

## 2018-07-31 MED ORDER — PREDNISONE 20 MG PO TABS: ORAL_TABLET | ORAL | 0 refills | Status: DC

## 2018-07-31 MED ORDER — AZITHROMYCIN 250 MG PO TABS: ORAL_TABLET | ORAL | 0 refills | Status: DC

## 2018-07-31 MED ORDER — ALBUTEROL SULFATE HFA 108 (90 BASE) MCG/ACT IN AERS: 2.0000 | INHALATION_SPRAY | Freq: Four times a day (QID) | RESPIRATORY_TRACT | 0 refills | Status: DC | PRN

## 2018-07-31 MED ORDER — BUDESONIDE-FORMOTEROL FUMARATE 80-4.5 MCG/ACT IN AERO: 2.0000 | INHALATION_SPRAY | Freq: Two times a day (BID) | RESPIRATORY_TRACT | 3 refills | Status: DC

## 2018-07-31 NOTE — Progress Notes (Signed)
  Subjective:   Patient ID: Patricia Stanton, female    DOB: 09-25-1985, 32 y.o.   MRN: [id number] CC: Shortness of Breath; Cough; Nasal Congestion; and Chest congestion  HPI: Patricia Stanton is a 32 y.o. female   Has had coughing and shortness of breath for the last 3 weeks.  Thinks it is getting worse over the last few days.  Started as URI, cough bothering her the most now.  Smoking less than a pack a day.  She is open to cutting back as is her husband.  No fevers.  Appetite has been down.  Relevant past medical, surgical, family and social history reviewed. Allergies and medications reviewed and updated. Social History   Tobacco Use  Smoking Status Current Every Day Smoker  . Packs/day: 0.50  . Years: 11.00  . Pack years: 5.50  . Types: Cigarettes  . Start date: 09/17/2003  Smokeless Tobacco Never Used   ROS: Per HPI   Objective:    BP 136/88   Pulse 98   Temp 98.6 F (37 C) (Oral)   Resp (!) 22   Ht 5\' 5"  (1.651 m)   Wt (!) 332 lb 6.4 oz (150.8 kg)   SpO2 98%   BMI 55.31 kg/m   Wt Readings from Last 3 Encounters:  07/31/18 (!) 332 lb 6.4 oz (150.8 kg)  06/26/18 (!) 322 lb 6.4 oz (146.2 kg)  03/26/18 (!) 322 lb (146.1 kg)    Gen: NAD, alert, cooperative with exam, NCAT EYES: EOMI, no conjunctival injection, or no icterus ENT:  TMs pearly gray b/l, OP without erythema LYMPH: no cervical LAD CV: NRRR, normal S1/S2, no murmur, distal pulses 2+ b/l Resp: Inspiratory and expiratory wheezes, speaking in sentences Abd: +BS, soft, NTND. no guarding or organomegaly Ext: No edema, warm Neuro: Alert and oriented MSK: normal muscle bulk  Assessment & Plan:  Patricia Stanton was seen today for shortness of breath, cough, nasal congestion and chest congestion.  Diagnoses and all orders for this visit:  COPD exacerbation ([place]) Treat with below.  Return precautions discussed. -     predniSONE (DELTASONE) 20 MG tablet; 2 po at same time daily for 3 days -     azithromycin  (ZITHROMAX) 250 MG tablet; Take 2 the first day and then one each day after. -     albuterol (PROVENTIL HFA;VENTOLIN HFA) 108 (90 Base) MCG/ACT inhaler; Inhale 2 puffs into the lungs every 6 (six) hours as needed for wheezing or shortness of breath. -     budesonide-formoterol (SYMBICORT) 80-4.5 MCG/ACT inhaler; Inhale 2 puffs into the lungs 2 (two) times daily.  Tobacco use Discussed cessation strategies.  Patient wants to avoid medications if she is worried about the side effects.  Follow up plan: Return in about 2 weeks (around 08/14/2018). Assunta Found, MD [place]

## 2018-08-01 NOTE — Telephone Encounter (Signed)
Pt was given pred, antibiotic and inhaler - LM for her to call back if she still needed to speak with [place].

## 2018-08-06 NOTE — Telephone Encounter (Signed)
Encounter closed, unable to reach patient

## 2018-08-20 ENCOUNTER — Ambulatory Visit: Payer: Medicaid Other | Admitting: Pediatrics

## 2018-08-31 DIAGNOSIS — Z79899 Other long term (current) drug therapy: Secondary | ICD-10-CM | POA: Diagnosis not present

## 2018-08-31 DIAGNOSIS — M5136 Other intervertebral disc degeneration, lumbar region: Secondary | ICD-10-CM | POA: Diagnosis not present

## 2018-08-31 DIAGNOSIS — Z5181 Encounter for therapeutic drug level monitoring: Secondary | ICD-10-CM | POA: Diagnosis not present

## 2018-09-01 ENCOUNTER — Encounter: Payer: Self-pay | Admitting: Pediatrics

## 2018-09-01 ENCOUNTER — Telehealth: Payer: Self-pay | Admitting: Pediatrics

## 2018-09-01 NOTE — Telephone Encounter (Signed)
I will send in albuterol, needs to be seen for antibiotics or prednisone.

## 2018-09-01 NOTE — Telephone Encounter (Signed)
Pt having same chest congestion symptoms as she had couple weeks ago, not having any fever, just a lot of fever.

## 2018-09-02 NOTE — Telephone Encounter (Signed)
Pt aware of MD feedback and scheduled appt with Dr Evette Doffing in the morning at 8:15.

## 2018-09-03 ENCOUNTER — Ambulatory Visit: Payer: Medicaid Other | Admitting: Pediatrics

## 2018-09-03 ENCOUNTER — Encounter: Payer: Self-pay | Admitting: Pediatrics

## 2018-09-03 VITALS — BP 136/86 | HR 92 | Temp 97.4°F | Resp 22 | Ht 65.0 in | Wt 323.0 lb

## 2018-09-03 DIAGNOSIS — J0100 Acute maxillary sinusitis, unspecified: Secondary | ICD-10-CM | POA: Diagnosis not present

## 2018-09-03 DIAGNOSIS — J441 Chronic obstructive pulmonary disease with (acute) exacerbation: Secondary | ICD-10-CM

## 2018-09-03 MED ORDER — CETIRIZINE HCL 10 MG PO TABS: 10.0000 mg | ORAL_TABLET | Freq: Every day | ORAL | 11 refills | Status: DC

## 2018-09-03 MED ORDER — DOXYCYCLINE HYCLATE 100 MG PO TABS: 100.0000 mg | ORAL_TABLET | Freq: Two times a day (BID) | ORAL | 0 refills | Status: DC

## 2018-09-03 MED ORDER — ALBUTEROL SULFATE (2.5 MG/3ML) 0.083% IN NEBU: 2.5000 mg | INHALATION_SOLUTION | Freq: Four times a day (QID) | RESPIRATORY_TRACT | 1 refills | Status: DC | PRN

## 2018-09-03 MED ORDER — BUDESONIDE-FORMOTEROL FUMARATE 80-4.5 MCG/ACT IN AERO: 2.0000 | INHALATION_SPRAY | Freq: Two times a day (BID) | RESPIRATORY_TRACT | 3 refills | Status: DC

## 2018-09-03 MED ORDER — ALBUTEROL SULFATE HFA 108 (90 BASE) MCG/ACT IN AERS: 2.0000 | INHALATION_SPRAY | Freq: Four times a day (QID) | RESPIRATORY_TRACT | 2 refills | Status: DC | PRN

## 2018-09-03 MED ORDER — FLUTICASONE PROPIONATE 50 MCG/ACT NA SUSP
2.0000 | Freq: Every day | NASAL | 6 refills | Status: DC
Start: 2018-09-03 — End: 2018-10-08

## 2018-09-03 MED ORDER — PREDNISONE 20 MG PO TABS: ORAL_TABLET | ORAL | 0 refills | Status: DC

## 2018-09-03 NOTE — Progress Notes (Signed)
Subjective:   Patient ID: Patricia Stanton, female    DOB: 06-Nov-1985, 32 y.o.   MRN: [id number] CC: Nasal Congestion; Cough; and sinus pressure  HPI: Patricia Stanton is a 32 y.o. female   Here today with husband.  Improved after treatment with prednisone and azithromycin last visit 1 month ago.  32 year old came home sick with URI symptoms over a week ago.  Patient started having symptoms 8 days ago.  Past couple days she has been feeling worse.  Pressure in her sinuses is bothering her the most.  Feels tight when she is taking deep breaths.  Almost out of nebulizer albuterol.  Has been taking intermittently.  Taking Tylenol Cold and sinus in the morning, liquid form at night.  No fevers.  No nausea or vomiting.  Started on Symbicort last visit, not taking every day right now.  Relevant past medical, surgical, family and social history reviewed. Allergies and medications reviewed and updated. Social History   Tobacco Use  Smoking Status Current Every Day Smoker  . Packs/day: 0.50  . Years: 11.00  . Pack years: 5.50  . Types: Cigarettes  . Start date: 09/17/2003  Smokeless Tobacco Never Used   ROS: Per HPI   Objective:    BP 136/86   Pulse 92   Temp (!) 97.4 F (36.3 C) (Oral)   Resp (!) 22   Ht 5' 5"  (1.651 m)   Wt (!) 323 lb (146.5 kg)   SpO2 97%   BMI 53.75 kg/m   Wt Readings from Last 3 Encounters:  09/03/18 (!) 323 lb (146.5 kg)  07/31/18 (!) 332 lb 6.4 oz (150.8 kg)  06/26/18 (!) 322 lb 6.4 oz (146.2 kg)    Gen: NAD, alert, cooperative with exam, NCAT EYES: EOMI, no conjunctival injection, or no icterus ENT:  TMs pearly gray b/l, external ear canals with excoriations bilaterally, OP without erythema, pressure over MAC sinuses bilaterally LYMPH: no cervical LAD CV: NRRR, normal S1/S2, no murmur, distal pulses 2+ b/l Resp: Wheezes bilaterally, comfortable WOB Ext: No edema, warm Neuro: Alert and oriented] MSK: normal muscle bulk  Assessment & Plan:  Patricia Stanton was  seen today for nasal congestion, cough and sinus pressure.  Diagnoses and all orders for this visit:  Acute non-recurrent maxillary sinusitis Start Nettie pot sinus rinses twice a day using distilled water. -     cetirizine (ZYRTEC) 10 MG tablet; Take 1 tablet (10 mg total) by mouth daily. -     fluticasone (FLONASE) 50 MCG/ACT nasal spray; Place 2 sprays into both nostrils daily. -     doxycycline (VIBRA-TABS) 100 MG tablet; Take 1 tablet (100 mg total) by mouth 2 (two) times daily.  COPD exacerbation ([facility]) Start albuterol 3-4 times a day for the next 5 days.  Then take as needed every 6 hours.  Restart Symbicort.  Take every day twice a day until next office visit. -     doxycycline (VIBRA-TABS) 100 MG tablet; Take 1 tablet (100 mg total) by mouth 2 (two) times daily. -     predniSONE (DELTASONE) 20 MG tablet; 2 po at same time daily for 3 days -     albuterol (PROVENTIL HFA;VENTOLIN HFA) 108 (90 Base) MCG/ACT inhaler; Inhale 2 puffs into the lungs every 6 (six) hours as needed for wheezing or shortness of breath. -     budesonide-formoterol (SYMBICORT) 80-4.5 MCG/ACT inhaler; Inhale 2 puffs into the lungs 2 (two) times daily. -     albuterol (PROVENTIL) (2.5 MG/3ML) 0.083% nebulizer  solution; Take 3 mLs (2.5 mg total) by nebulization every 6 (six) hours as needed for wheezing or shortness of breath.   Follow up plan: Return if symptoms worsen or fail to improve. Assunta Found, MD [place]

## 2018-09-03 NOTE — Patient Instructions (Signed)
Fever reducer and headache: tylenol and ibuprofen, can take together or alternating   Sinus pressure:  Nasal steroid such as flonase/fluticaone or nasocort daily Can also take daily antihistamine such as loratadine/claritin or cetirizine/zyrtec Decongestant such as phenylephrine, but take in the morning or may keep you awake at night  Sinus rinses/irritation: Netipot or similar with distilled water 2-3 times a day to clear out sinuses or Normal saline nasal spray  Sore throat:  Throat lozenges chloroseptic spray  Stick with bland foods Drink lots of fluids

## 2018-09-28 ENCOUNTER — Encounter: Payer: Medicaid Other | Admitting: Pediatrics

## 2018-10-01 ENCOUNTER — Ambulatory Visit: Payer: Medicaid Other | Admitting: Pediatrics

## 2018-10-01 ENCOUNTER — Encounter: Payer: Self-pay | Admitting: Pediatrics

## 2018-10-01 VITALS — BP 137/96 | HR 99 | Temp 97.3°F | Ht 65.0 in | Wt 310.5 lb

## 2018-10-01 DIAGNOSIS — R109 Unspecified abdominal pain: Secondary | ICD-10-CM

## 2018-10-01 DIAGNOSIS — R809 Proteinuria, unspecified: Secondary | ICD-10-CM | POA: Diagnosis not present

## 2018-10-01 DIAGNOSIS — R319 Hematuria, unspecified: Secondary | ICD-10-CM

## 2018-10-01 DIAGNOSIS — E119 Type 2 diabetes mellitus without complications: Secondary | ICD-10-CM

## 2018-10-01 LAB — URINALYSIS
Bilirubin, UA: NEGATIVE
Leukocytes, UA: NEGATIVE
Nitrite, UA: NEGATIVE
Specific Gravity, UA: >1.03 — ABNORMAL HIGH (ref 1.005–1.030)
UUROB: 0.2 mg/dL (ref 0.2–1.0)
pH, UA: 6 (ref 5.0–7.5)

## 2018-10-01 LAB — URINALYSIS, MICROSCOPIC ONLY: Renal Epithel, UA: NONE SEEN /hpf

## 2018-10-01 LAB — BAYER DCA HB A1C WAIVED: HB A1C (BAYER DCA - WAIVED): 8.6 % — ABNORMAL HIGH (ref <7.0)

## 2018-10-01 NOTE — Progress Notes (Signed)
  Subjective:   Patient ID: Patricia Stanton, female    DOB: Jan 28, 1986, 33 y.o.   MRN: [id number] CC: Vaginal Bleeding (pt here today c/o right flank pain, and light vag bleeding x 6 days)  HPI: Patricia Stanton is a 33 y.o. female   Started having spotting of red blood when she wiped following urination 6 days ago.  This was the same time she is supposed to start a period.  She only notices the spotting in the morning.  Urine more clear throughout the day.  Having some right mid back pain.  No dysuria.  No fevers.  Feeling slightly nauseous off and on over the past 6 days.Marland Kitchen  Appetite has been down.  She has irregular periods.  She has had his tubal ligation.  Recent episode of upper respiratory symptoms including cough, wheezing exacerbation, treated with prednisone, albuterol and doxycycline.  She has been feeling a whole lot better.  Still occasionally wheezing.  Taking Symbicort and albuterol usually twice a day.  Has not taken anything today.  Relevant past medical, surgical, family and social history reviewed. Allergies and medications reviewed and updated. Social History   Tobacco Use  Smoking Status Current Every Day Smoker  . Packs/day: 0.50  . Years: 11.00  . Pack years: 5.50  . Types: Cigarettes  . Start date: 09/17/2003  Smokeless Tobacco Never Used   ROS: Per HPI   Objective:    BP (!) 137/96   Pulse 99   Temp (!) 97.3 F (36.3 C) (Oral)   Ht 5' 5" (1.651 m)   Wt (!) 310 lb 8 oz (140.8 kg)   BMI 51.67 kg/m   Wt Readings from Last 3 Encounters:  10/01/18 (!) 310 lb 8 oz (140.8 kg)  09/03/18 (!) 323 lb (146.5 kg)  07/31/18 (!) 332 lb 6.4 oz (150.8 kg)    Gen: NAD, alert, cooperative with exam, NCAT EYES: EOMI, no conjunctival injection, or no icterus CV: NRRR, normal S1/S2, no murmur, distal pulses 2+ b/l Resp: CTABL, no wheezes, normal WOB Abd: +BS, soft, mildly tender to palpation mid abdomen.  Nondistended. no guarding  Ext: No edema, warm Neuro: Alert and  oriented, strength equal b/l UE and LE, coordination grossly normal MSK: Right mid back paraspinal muscles tender with palpation  Assessment & Plan:  Patricia Stanton was seen today for hematuria and possible vaginal bleeding.   Diagnoses and all orders for this visit:  Flank pain Urinalysis with 3+ blood, 2+ protein.  Negative nitrites, negative leukocytes.  Will send for microscopy, urine culture.  Check blood work.  Also check urine protein creatinine ratio.  Has had trace amounts of protein in the past.  Any fevers, worsening abdominal pain or other symptoms let us know.  Keep follow-up appointment next week. -     Urinalysis  Diabetes mellitus without complication (HCC) -     CMP14+EGFR -     Bayer DCA Hb A1c Waived  Hematuria, unspecified type -     Urine Microscopic -     Urine Culture -     CBC with Differential/Platelet  Proteinuria, unspecified type -     Urine Microscopic -     Urine Culture -     Protein / Creatinine Ratio, Urine   Follow up plan: Has chronic follow-up appoint with me next week. Assunta Found, MD [place]

## 2018-10-02 ENCOUNTER — Other Ambulatory Visit: Payer: Self-pay | Admitting: Pediatrics

## 2018-10-02 DIAGNOSIS — N12 Tubulo-interstitial nephritis, not specified as acute or chronic: Secondary | ICD-10-CM

## 2018-10-02 LAB — CBC WITH DIFFERENTIAL/PLATELET
Basophils Absolute: 0.1 10*3/uL (ref 0.0–0.2)
Basos: 0 %
EOS (ABSOLUTE): 0.1 10*3/uL (ref 0.0–0.4)
Eos: 1 %
Hematocrit: 41.2 % (ref 34.0–46.6)
Hemoglobin: 13.7 g/dL (ref 11.1–15.9)
Immature Grans (Abs): 0.1 10*3/uL (ref 0.0–0.1)
Immature Granulocytes: 1 %
Lymphocytes Absolute: 3.8 10*3/uL — ABNORMAL HIGH (ref 0.7–3.1)
Lymphs: 27 %
MCH: 24.8 pg — ABNORMAL LOW (ref 26.6–33.0)
MCHC: 33.3 g/dL (ref 31.5–35.7)
MCV: 75 fL — ABNORMAL LOW (ref 79–97)
Monocytes Absolute: 0.9 10*3/uL (ref 0.1–0.9)
Monocytes: 6 %
Neutrophils Absolute: 9.2 10*3/uL — ABNORMAL HIGH (ref 1.4–7.0)
Neutrophils: 65 %
Platelets: 327 10*3/uL (ref 150–450)
RBC: 5.53 x10E6/uL — ABNORMAL HIGH (ref 3.77–5.28)
RDW: 16.2 % — ABNORMAL HIGH (ref 11.7–15.4)
WBC: 14.1 10*3/uL — ABNORMAL HIGH (ref 3.4–10.8)

## 2018-10-02 LAB — CMP14+EGFR
ALT: 48 IU/L — ABNORMAL HIGH (ref 0–32)
AST: 41 IU/L — ABNORMAL HIGH (ref 0–40)
Albumin/Globulin Ratio: 1.5 (ref 1.2–2.2)
Albumin: 4.2 g/dL (ref 3.5–5.5)
Alkaline Phosphatase: 87 IU/L (ref 39–117)
BUN/Creatinine Ratio: 11 (ref 9–23)
BUN: 7 mg/dL (ref 6–20)
Bilirubin Total: 0.3 mg/dL (ref 0.0–1.2)
CO2: 19 mmol/L — ABNORMAL LOW (ref 20–29)
Calcium: 9 mg/dL (ref 8.7–10.2)
Chloride: 100 mmol/L (ref 96–106)
Creatinine, Ser: 0.62 mg/dL (ref 0.57–1.00)
GFR calc Af Amer: 137 mL/min/{1.73_m2} (ref >59)
GFR, EST NON AFRICAN AMERICAN: 119 mL/min/{1.73_m2} (ref >59)
Globulin, Total: 2.8 g/dL (ref 1.5–4.5)
Glucose: 158 mg/dL — ABNORMAL HIGH (ref 65–99)
Potassium: 4 mmol/L (ref 3.5–5.2)
Sodium: 136 mmol/L (ref 134–144)
Total Protein: 7 g/dL (ref 6.0–8.5)

## 2018-10-02 LAB — PROTEIN / CREATININE RATIO, URINE
Creatinine, Urine: 374.9 mg/dL
Protein, Ur: 57.6 mg/dL
Protein/Creat Ratio: 154 mg/g creat (ref 0–200)

## 2018-10-02 MED ORDER — CIPROFLOXACIN HCL 500 MG PO TABS: 500.0000 mg | ORAL_TABLET | Freq: Two times a day (BID) | ORAL | 0 refills | Status: DC

## 2018-10-03 LAB — URINE CULTURE

## 2018-10-05 ENCOUNTER — Other Ambulatory Visit: Payer: Self-pay | Admitting: Pediatrics

## 2018-10-05 DIAGNOSIS — N3001 Acute cystitis with hematuria: Secondary | ICD-10-CM

## 2018-10-05 MED ORDER — AMOXICILLIN 500 MG PO CAPS: 500.0000 mg | ORAL_CAPSULE | Freq: Two times a day (BID) | ORAL | 0 refills | Status: DC

## 2018-10-07 ENCOUNTER — Telehealth: Payer: Self-pay | Admitting: Family Medicine

## 2018-10-08 ENCOUNTER — Encounter: Payer: Self-pay | Admitting: Nurse Practitioner

## 2018-10-08 ENCOUNTER — Ambulatory Visit: Payer: Medicaid Other | Admitting: Nurse Practitioner

## 2018-10-08 ENCOUNTER — Ambulatory Visit (INDEPENDENT_AMBULATORY_CARE_PROVIDER_SITE_OTHER): Payer: Medicaid Other

## 2018-10-08 VITALS — BP 140/79 | HR 101 | Temp 98.6°F | Ht 65.0 in | Wt 307.0 lb

## 2018-10-08 DIAGNOSIS — R05 Cough: Secondary | ICD-10-CM

## 2018-10-08 DIAGNOSIS — R059 Cough, unspecified: Secondary | ICD-10-CM

## 2018-10-08 MED ORDER — MONTELUKAST SODIUM 10 MG PO TABS: 10.0000 mg | ORAL_TABLET | Freq: Every day | ORAL | 3 refills | Status: DC

## 2018-10-08 MED ORDER — BENZONATATE 100 MG PO CAPS: 100.0000 mg | ORAL_CAPSULE | Freq: Three times a day (TID) | ORAL | 0 refills | Status: DC | PRN

## 2018-10-08 NOTE — Progress Notes (Signed)
Subjective:    Patient ID: Patricia Stanton, female    DOB: [birth year], 33 y.o.   MRN: [id number]   Chief Complaint: cough and congestion  HPI Patient comes in today c/o cough and congestion. Has ben off and on since November. She has been diagnosed with bronchitis and upper resp infection. She is currenlty on amoxicillin for kidney infection. She has been on steroids which did not help. Has tried tylenol cold and sinus and alkaseltzer which only helps for a little bit. She was also put on symbicort BID. She does smoke about 1/2 pack a day.   Review of Systems  Constitutional: Negative for chills and fever.  HENT: Positive for congestion. Negative for sore throat and trouble swallowing.   Respiratory: Positive for cough. Negative for shortness of breath.   Cardiovascular: Negative.   Gastrointestinal: Negative.   Genitourinary: Negative.   Neurological: Negative.   Psychiatric/Behavioral: Negative.   All other systems reviewed and are negative.      Objective:   Physical Exam Vitals signs and nursing note reviewed.  Constitutional:      General: She is not in acute distress.    Appearance: Normal appearance. She is well-developed.  HENT:     Head: Normocephalic.     Right Ear: Hearing, tympanic membrane, ear canal and external ear normal.     Left Ear: Hearing, tympanic membrane, ear canal and external ear normal.     Nose: Mucosal edema, congestion and rhinorrhea present.     Right Sinus: No maxillary sinus tenderness or frontal sinus tenderness.     Left Sinus: No maxillary sinus tenderness or frontal sinus tenderness.     Mouth/Throat:     Mouth: Mucous membranes are moist.     Pharynx: Oropharynx is clear. Uvula midline.  Eyes:     Pupils: Pupils are equal, round, and reactive to light.  Neck:     Musculoskeletal: Normal range of motion and neck supple.     Vascular: No carotid bruit or JVD.  Cardiovascular:     Rate and Rhythm: Normal rate and regular rhythm.   Heart sounds: Normal heart sounds.  Pulmonary:     Effort: Pulmonary effort is normal. No respiratory distress.     Breath sounds: Normal breath sounds. No wheezing or rales.  Chest:     Chest wall: No tenderness.  Abdominal:     General: Bowel sounds are normal. There is no distension or abdominal bruit.     Palpations: Abdomen is soft. There is no hepatomegaly, splenomegaly, mass or pulsatile mass.     Tenderness: There is no abdominal tenderness.  Musculoskeletal: Normal range of motion.  Lymphadenopathy:     Cervical: No cervical adenopathy.  Skin:    General: Skin is warm and dry.  Neurological:     Mental Status: She is alert and oriented to person, place, and time.     Deep Tendon Reflexes: Reflexes are normal and symmetric.  Psychiatric:        Behavior: Behavior normal.        Thought Content: Thought content normal.        Judgment: Judgment normal.    BP 140/79   Pulse (!) 101   Temp 98.6 F (37 C) (Oral)   Ht 5\' 5"  (1.651 m)   Wt (!) 307 lb (139.3 kg)   SpO2 95%   BMI 51.09 kg/m   Chest xray- bronchic changes- Mary-Margaret Hassell Done, FNP      Assessment & Plan:  1.  Cough Force fluids Run humidifier  In home contineu to decrease smoking RTO prn - DG Chest 2 View; Future - benzonatate (TESSALON PERLES) 100 MG capsule; Take 1 capsule (100 mg total) by mouth 3 (three) times daily as needed for cough.  Dispense: 20 capsule; Refill: 0 - montelukast (SINGULAIR) 10 MG tablet; Take 1 tablet (10 mg total) by mouth at bedtime.  Dispense: 30 tablet; Refill: [place], FNP

## 2018-10-08 NOTE — Patient Instructions (Signed)

## 2018-10-14 ENCOUNTER — Encounter: Payer: Medicaid Other | Admitting: Pediatrics

## 2018-10-15 ENCOUNTER — Encounter: Payer: Medicaid Other | Admitting: Family Medicine

## 2018-11-11 DIAGNOSIS — M5416 Radiculopathy, lumbar region: Secondary | ICD-10-CM | POA: Diagnosis not present

## 2018-11-11 DIAGNOSIS — Z79899 Other long term (current) drug therapy: Secondary | ICD-10-CM | POA: Diagnosis not present

## 2018-11-11 DIAGNOSIS — Z5181 Encounter for therapeutic drug level monitoring: Secondary | ICD-10-CM | POA: Diagnosis not present

## 2018-11-17 ENCOUNTER — Ambulatory Visit: Payer: Medicaid Other | Admitting: Family Medicine

## 2018-11-17 ENCOUNTER — Encounter: Payer: Self-pay | Admitting: Family Medicine

## 2018-11-17 VITALS — BP 151/99 | HR 120 | Temp 97.1°F | Ht 65.0 in | Wt 303.0 lb

## 2018-11-17 DIAGNOSIS — E1169 Type 2 diabetes mellitus with other specified complication: Secondary | ICD-10-CM

## 2018-11-17 DIAGNOSIS — I10 Essential (primary) hypertension: Secondary | ICD-10-CM | POA: Diagnosis not present

## 2018-11-17 DIAGNOSIS — N611 Abscess of the breast and nipple: Secondary | ICD-10-CM

## 2018-11-17 DIAGNOSIS — Z1239 Encounter for other screening for malignant neoplasm of breast: Secondary | ICD-10-CM

## 2018-11-17 DIAGNOSIS — Z124 Encounter for screening for malignant neoplasm of cervix: Secondary | ICD-10-CM

## 2018-11-17 DIAGNOSIS — R05 Cough: Secondary | ICD-10-CM

## 2018-11-17 DIAGNOSIS — E785 Hyperlipidemia, unspecified: Secondary | ICD-10-CM

## 2018-11-17 DIAGNOSIS — Z0000 Encounter for general adult medical examination without abnormal findings: Secondary | ICD-10-CM

## 2018-11-17 DIAGNOSIS — E119 Type 2 diabetes mellitus without complications: Secondary | ICD-10-CM

## 2018-11-17 DIAGNOSIS — F419 Anxiety disorder, unspecified: Secondary | ICD-10-CM | POA: Diagnosis not present

## 2018-11-17 DIAGNOSIS — Z0001 Encounter for general adult medical examination with abnormal findings: Secondary | ICD-10-CM | POA: Diagnosis not present

## 2018-11-17 DIAGNOSIS — D72828 Other elevated white blood cell count: Secondary | ICD-10-CM

## 2018-11-17 DIAGNOSIS — D729 Disorder of white blood cells, unspecified: Secondary | ICD-10-CM

## 2018-11-17 DIAGNOSIS — R059 Cough, unspecified: Secondary | ICD-10-CM

## 2018-11-17 LAB — BAYER DCA HB A1C WAIVED: HB A1C (BAYER DCA - WAIVED): 8 % — ABNORMAL HIGH

## 2018-11-17 MED ORDER — MONTELUKAST SODIUM 10 MG PO TABS: 10.0000 mg | ORAL_TABLET | Freq: Every day | ORAL | 3 refills | Status: DC

## 2018-11-17 MED ORDER — CETIRIZINE HCL 10 MG PO TABS: 10.0000 mg | ORAL_TABLET | Freq: Every day | ORAL | 11 refills | Status: DC

## 2018-11-17 MED ORDER — EMPAGLIFLOZIN 10 MG PO TABS: 10.0000 mg | ORAL_TABLET | Freq: Every day | ORAL | 4 refills | Status: DC

## 2018-11-17 MED ORDER — SULFAMETHOXAZOLE-TRIMETHOPRIM 800-160 MG PO TABS: 1.0000 | ORAL_TABLET | Freq: Two times a day (BID) | ORAL | 0 refills | Status: AC

## 2018-11-17 MED ORDER — ALBUTEROL SULFATE (2.5 MG/3ML) 0.083% IN NEBU: 2.5000 mg | INHALATION_SOLUTION | Freq: Four times a day (QID) | RESPIRATORY_TRACT | 1 refills | Status: DC | PRN

## 2018-11-17 MED ORDER — ALBUTEROL SULFATE HFA 108 (90 BASE) MCG/ACT IN AERS: 2.0000 | INHALATION_SPRAY | Freq: Four times a day (QID) | RESPIRATORY_TRACT | 2 refills | Status: DC | PRN

## 2018-11-17 MED ORDER — LISINOPRIL-HYDROCHLOROTHIAZIDE 10-12.5 MG PO TABS: 1.0000 | ORAL_TABLET | Freq: Every day | ORAL | 1 refills | Status: DC

## 2018-11-17 MED ORDER — PRAVASTATIN SODIUM 20 MG PO TABS: 20.0000 mg | ORAL_TABLET | Freq: Every day | ORAL | 3 refills | Status: DC

## 2018-11-17 MED ORDER — DULOXETINE HCL 60 MG PO CPEP: 60.0000 mg | ORAL_CAPSULE | Freq: Two times a day (BID) | ORAL | 1 refills | Status: DC

## 2018-11-17 MED ORDER — METFORMIN HCL ER 500 MG PO TB24: 1000.0000 mg | ORAL_TABLET | Freq: Two times a day (BID) | ORAL | 1 refills | Status: DC

## 2018-11-17 NOTE — Progress Notes (Signed)
Subjective:     Patricia Stanton is a 33 y.o. female and is here for a comprehensive physical exam. The patient reports no problems. Reports she is doing well. Has been doing a better job dieting and exercising. States she has been putting more effort into watching what she eats and her portion size. She states she is compliant with all of her medications and tolerating them well.   Social History   Socioeconomic History  . Marital status: Single    Spouse name: Herbie Baltimore  . Number of children: 1  . Years of education: 46  . Highest education level: Not on file  Occupational History  . Occupation: CNA    Comment: disability pending    Comment: hernia surgery  Social Needs  . Financial resource strain: Not on file  . Food insecurity:    Worry: Not on file    Inability: Not on file  . Transportation needs:    Medical: Not on file    Non-medical: Not on file  Tobacco Use  . Smoking status: Current Every Day Smoker    Packs/day: 0.50    Years: 11.00    Pack years: 5.50    Types: Cigarettes    Start date: 09/17/2003  . Smokeless tobacco: Never Used  Substance and Sexual Activity  . Alcohol use: No  . Drug use: No  . Sexual activity: Yes    Birth control/protection: Surgical    Comment: BTL  Lifestyle  . Physical activity:    Days per week: Not on file    Minutes per session: Not on file  . Stress: Not on file  Relationships  . Social connections:    Talks on phone: Not on file    Gets together: Not on file    Attends religious service: Not on file    Active member of club or organization: Not on file    Attends meetings of clubs or organizations: Not on file    Relationship status: Not on file  . Intimate partner violence:    Fear of current or ex partner: Not on file    Emotionally abused: Not on file    Physically abused: Not on file    Forced sexual activity: Not on file  Other Topics Concern  . Not on file  Social History Narrative   Disabled from nursing   Lives at home with husband and daughter Adrianna   Health Maintenance  Topic Date Due  . FOOT EXAM  05/01/1995  . OPHTHALMOLOGY EXAM  05/01/1995  . HIV Screening  04/30/2000  . PAP SMEAR-Modifier  04/30/2006  . HEMOGLOBIN A1C  04/01/2019  . TETANUS/TDAP  06/26/2028  . INFLUENZA VACCINE  Completed  . PNEUMOCOCCAL POLYSACCHARIDE VACCINE AGE 35-64 HIGH RISK  Completed    The following portions of the patient's history were reviewed and updated as appropriate: allergies, current medications, past family history, past medical history, past social history, past surgical history and problem list.  Review of Systems Constitutional: negative Eyes: negative Ears, nose, mouth, throat, and face: negative Respiratory: positive for cough Cardiovascular: negative Gastrointestinal: negative Genitourinary:negative Integument/breast: positive for skin lesion to left breast Hematologic/lymphatic: negative Musculoskeletal:negative Neurological: negative Behavioral/Psych: positive for anxiety Endocrine: negative Allergic/Immunologic: negative   Objective:    BP (!) 151/99   Pulse (!) 120   Temp (!) 97.1 F (36.2 C) (Oral)   Ht '5\' 5"'$  (1.651 m)   Wt (!) 303 lb (137.4 kg)   BMI 50.42 kg/m  General appearance:  alert, cooperative, no distress and morbidly obese Head: Normocephalic, without obvious abnormality, atraumatic Eyes: conjunctivae/corneas clear. PERRL, EOM's intact. Fundi benign. Ears: normal TM's and external ear canals both ears Nose: Nares normal. Septum midline. Mucosa normal. No drainage or sinus tenderness. Throat: lips, mucosa, and tongue normal; teeth and gums normal Neck: no adenopathy, no carotid bruit, no JVD, supple, symmetrical, trachea midline and thyroid not enlarged, symmetric, no tenderness/mass/nodules Back: symmetric, no curvature. ROM normal. No CVA tenderness. Lungs: clear to auscultation bilaterally Breasts: normal appearance, no masses or tenderness, positive  findings: superficial skin abscess to left breast, not fluctuant, no drainage, tender to touch Heart: regular rate and rhythm, S1, S2 normal, no murmur, click, rub or gallop Abdomen: soft, non-tender; bowel sounds normal; no masses,  no organomegaly Pelvic: cervix normal in appearance, external genitalia normal, no adnexal masses or tenderness, no cervical motion tenderness, rectovaginal septum normal, uterus normal size, shape, and consistency and vagina normal without discharge Extremities: extremities normal, atraumatic, no cyanosis or edema Pulses: 2+ and symmetric Skin: Skin color, texture, turgor normal. No rashes or lesions Lymph nodes: Cervical, supraclavicular, and axillary nodes normal. Neurologic: Grossly normal    Assessment:     Sher was seen today for gynecologic exam.  Diagnoses and all orders for this visit:  Annual physical exam Health maintenance discussed. Continue diet and exercise. Labs pending.  -     CBC with Differential/Platelet -     CMP14+EGFR -     Lipid panel -     TSH -     Bayer DCA Hb A1c Waived -     Microalbumin / creatinine urine ratio -     IGP, Aptima HPV, rfx 16/18,45  Hyperlipidemia associated with type 2 diabetes mellitus ([place]) Labs pending. Continue below.  -     pravastatin (PRAVACHOL) 20 MG tablet; Take 1 tablet (20 mg total) by mouth daily. -     CMP14+EGFR -     Lipid panel  Cough Continue below. Report any new or worsening symptoms. -     montelukast (SINGULAIR) 10 MG tablet; Take 1 tablet (10 mg total) by mouth at bedtime. -     cetirizine (ZYRTEC) 10 MG tablet; Take 1 tablet (10 mg total) by mouth daily. -     albuterol (PROVENTIL HFA;VENTOLIN HFA) 108 (90 Base) MCG/ACT inhaler; Inhale 2 puffs into the lungs every 6 (six) hours as needed for wheezing or shortness of breath. -     albuterol (PROVENTIL) (2.5 MG/3ML) 0.083% nebulizer solution; Take 3 mLs (2.5 mg total) by nebulization every 6 (six) hours as needed for wheezing or  shortness of breath.  Diabetes mellitus without complication ([place]) W4O 8 in office today. Improved since last visit. Continue below. Will recheck in 3 months. Labs pending.  -     metFORMIN (GLUCOPHAGE XR) 500 MG 24 hr tablet; Take 2 tablets (1,000 mg total) by mouth 2 (two) times daily. -     empagliflozin (JARDIANCE) 10 MG TABS tablet; Take 10 mg by mouth daily. -     CBC with Differential/Platelet -     CMP14+EGFR -     Lipid panel -     TSH -     Bayer DCA Hb A1c Waived -     Microalbumin / creatinine urine ratio  Essential hypertension Labs pending. Stable, continue below.  -     lisinopril-hydrochlorothiazide (PRINZIDE,ZESTORETIC) 10-12.5 MG tablet; Take 1 tablet by mouth daily. -     CBC with Differential/Platelet -  CMP14+EGFR -     Lipid panel -     TSH -     Microalbumin / creatinine urine ratio  Anxiety Stable. Continue below. -     DULoxetine (CYMBALTA) 60 MG capsule; Take 1 capsule (60 mg total) by mouth 2 (two) times daily. -     TSH  Screening for malignant neoplasm of cervix -     IGP, Aptima HPV, rfx 16/18,45  Breast screening No nodules or lumps. Cystic breast tissue. Superficial abscess to left breast.   Morbid obesity ([place]) Diet and exercise encouraged. Labs pending.  -     CBC with Differential/Platelet -     CMP14+EGFR -     Lipid panel -     TSH -     Bayer DCA Hb A1c Waived  Abscess of left breast Symptomatic care discussed. Report any new or worsening symptoms. Medications as prescribed.  -     sulfamethoxazole-trimethoprim (BACTRIM DS) 800-160 MG tablet; Take 1 tablet by mouth 2 (two) times daily for 7 days.     Plan:     Return in 3 months (on 02/17/2019).  The above assessment and management plan was discussed with the patient. The patient verbalized understanding of and has agreed to the management plan. Patient is aware to call the clinic if symptoms fail to improve or worsen. Patient is aware when to return to the clinic for a  follow-up visit. Patient educated on when it is appropriate to go to the emergency department.   Monia Pouch, FNP-C [facility] [address] [place],  [address] [phone]

## 2018-11-17 NOTE — Patient Instructions (Signed)
 Preventive Care 18-39 Years, Female Preventive care refers to lifestyle choices and visits with your health care provider that can promote health and wellness. What does preventive care include?   A yearly physical exam. This is also called an annual well check.  Dental exams once or twice a year.  Routine eye exams. Ask your health care provider how often you should have your eyes checked.  Personal lifestyle choices, including: ? Daily care of your teeth and gums. ? Regular physical activity. ? Eating a healthy diet. ? Avoiding tobacco and drug use. ? Limiting alcohol use. ? Practicing safe sex. ? Taking vitamin and mineral supplements as recommended by your health care provider. What happens during an annual well check? The services and screenings done by your health care provider during your annual well check will depend on your age, overall health, lifestyle risk factors, and family history of disease. Counseling Your health care provider may ask you questions about your:  Alcohol use.  Tobacco use.  Drug use.  Emotional well-being.  Home and relationship well-being.  Sexual activity.  Eating habits.  Work and work environment.  Method of birth control.  Menstrual cycle.  Pregnancy history. Screening You may have the following tests or measurements:  Height, weight, and BMI.  Diabetes screening. This is done by checking your blood sugar (glucose) after you have not eaten for a while (fasting).  Blood pressure.  Lipid and cholesterol levels. These may be checked every 5 years starting at age 20.  Skin check.  Hepatitis C blood test.  Hepatitis B blood test.  Sexually transmitted disease (STD) testing.  BRCA-related cancer screening. This may be done if you have a family history of breast, ovarian, tubal, or peritoneal cancers.  Pelvic exam and Pap test. This may be done every 3 years starting at age 21. Starting at age 30, this may be done  every 5 years if you have a Pap test in combination with an HPV test. Discuss your test results, treatment options, and if necessary, the need for more tests with your health care provider. Vaccines Your health care provider may recommend certain vaccines, such as:  Influenza vaccine. This is recommended every year.  Tetanus, diphtheria, and acellular pertussis (Tdap, Td) vaccine. You may need a Td booster every 10 years.  Varicella vaccine. You may need this if you have not been vaccinated.  HPV vaccine. If you are 26 or younger, you may need three doses over 6 months.  Measles, mumps, and rubella (MMR) vaccine. You may need at least one dose of MMR. You may also need a second dose.  Pneumococcal 13-valent conjugate (PCV13) vaccine. You may need this if you have certain conditions and were not previously vaccinated.  Pneumococcal polysaccharide (PPSV23) vaccine. You may need one or two doses if you smoke cigarettes or if you have certain conditions.  Meningococcal vaccine. One dose is recommended if you are age 19-21 years and a first-year college student living in a residence hall, or if you have one of several medical conditions. You may also need additional booster doses.  Hepatitis A vaccine. You may need this if you have certain conditions or if you travel or work in places where you may be exposed to hepatitis A.  Hepatitis B vaccine. You may need this if you have certain conditions or if you travel or work in places where you may be exposed to hepatitis B.  Haemophilus influenzae type b (Hib) vaccine. You may need this if   you have certain risk factors. Talk to your health care provider about which screenings and vaccines you need and how often you need them. This information is not intended to replace advice given to you by your health care provider. Make sure you discuss any questions you have with your health care provider. Document Released: 10/29/2001 Document Revised:  04/15/2017 Document Reviewed: 07/04/2015 Elsevier Interactive Patient Education  2019 [employer]  

## 2018-11-18 LAB — CMP14+EGFR
ALT: 35 IU/L — ABNORMAL HIGH (ref 0–32)
AST: 24 IU/L (ref 0–40)
Albumin/Globulin Ratio: 1.8 (ref 1.2–2.2)
Albumin: 4.4 g/dL (ref 3.8–4.8)
Alkaline Phosphatase: 102 IU/L (ref 39–117)
BUN/Creatinine Ratio: 10 (ref 9–23)
BUN: 6 mg/dL (ref 6–20)
Bilirubin Total: 0.2 mg/dL (ref 0.0–1.2)
CO2: 19 mmol/L — AB (ref 20–29)
CREATININE: 0.6 mg/dL (ref 0.57–1.00)
Calcium: 9.4 mg/dL (ref 8.7–10.2)
Chloride: 101 mmol/L (ref 96–106)
GFR calc Af Amer: 139 mL/min/{1.73_m2} (ref >59)
GFR, EST NON AFRICAN AMERICAN: 120 mL/min/{1.73_m2} (ref >59)
GLUCOSE: 195 mg/dL — AB (ref 65–99)
Globulin, Total: 2.4 g/dL (ref 1.5–4.5)
Potassium: 4.1 mmol/L (ref 3.5–5.2)
Sodium: 140 mmol/L (ref 134–144)
Total Protein: 6.8 g/dL (ref 6.0–8.5)

## 2018-11-18 LAB — CBC WITH DIFFERENTIAL/PLATELET
BASOS: 0 %
Basophils Absolute: 0.1 10*3/uL (ref 0.0–0.2)
EOS (ABSOLUTE): 0.1 10*3/uL (ref 0.0–0.4)
Eos: 1 %
Hematocrit: 43.8 % (ref 34.0–46.6)
Hemoglobin: 14.1 g/dL (ref 11.1–15.9)
Immature Grans (Abs): 0.1 10*3/uL (ref 0.0–0.1)
Immature Granulocytes: 1 %
LYMPHS ABS: 3.6 10*3/uL — AB (ref 0.7–3.1)
Lymphs: 24 %
MCH: 23.9 pg — ABNORMAL LOW (ref 26.6–33.0)
MCHC: 32.2 g/dL (ref 31.5–35.7)
MCV: 74 fL — AB (ref 79–97)
Monocytes Absolute: 0.7 10*3/uL (ref 0.1–0.9)
Monocytes: 5 %
Neutrophils Absolute: 10.5 10*3/uL — ABNORMAL HIGH (ref 1.4–7.0)
Neutrophils: 69 %
Platelets: 351 10*3/uL (ref 150–450)
RBC: 5.89 x10E6/uL — ABNORMAL HIGH (ref 3.77–5.28)
RDW: 17.4 % — ABNORMAL HIGH (ref 11.7–15.4)
WBC: 15.1 10*3/uL — ABNORMAL HIGH (ref 3.4–10.8)

## 2018-11-18 LAB — LIPID PANEL
Chol/HDL Ratio: 5.9 ratio — ABNORMAL HIGH (ref 0.0–4.4)
Cholesterol, Total: 199 mg/dL (ref 100–199)
HDL: 34 mg/dL — AB (ref >39)
LDL Calculated: 120 mg/dL — ABNORMAL HIGH (ref 0–99)
Triglycerides: 226 mg/dL — ABNORMAL HIGH (ref 0–149)
VLDL Cholesterol Cal: 45 mg/dL — ABNORMAL HIGH (ref 5–40)

## 2018-11-18 LAB — TSH: TSH: 2.33 u[IU]/mL (ref 0.450–4.500)

## 2018-11-18 LAB — MICROALBUMIN / CREATININE URINE RATIO
Creatinine, Urine: 54.7 mg/dL
Microalb/Creat Ratio: 25 mg/g creat (ref 0–29)
Microalbumin, Urine: 13.6 ug/mL

## 2018-11-19 ENCOUNTER — Telehealth: Payer: Self-pay | Admitting: Family Medicine

## 2018-11-19 DIAGNOSIS — D72829 Elevated white blood cell count, unspecified: Secondary | ICD-10-CM | POA: Insufficient documentation

## 2018-11-19 MED ORDER — PRAVASTATIN SODIUM 40 MG PO TABS: 40.0000 mg | ORAL_TABLET | Freq: Every day | ORAL | 3 refills | Status: DC

## 2018-11-19 NOTE — Addendum Note (Signed)
Addended by: Baruch Gouty on: 11/19/2018 01:08 PM   Modules accepted: Orders

## 2018-11-19 NOTE — Telephone Encounter (Signed)
Patient aware of results and referral has been placed.

## 2018-11-24 LAB — IGP, APTIMA HPV, RFX 16/18,45
HPV APTIMA: POSITIVE — AB
HPV Genotype 16: NEGATIVE
HPV Genotype 18,45: NEGATIVE

## 2018-11-25 ENCOUNTER — Inpatient Hospital Stay (HOSPITAL_COMMUNITY): Payer: Medicaid Other | Attending: Hematology | Admitting: Hematology

## 2018-11-25 ENCOUNTER — Inpatient Hospital Stay (HOSPITAL_COMMUNITY): Payer: Medicaid Other

## 2018-11-25 ENCOUNTER — Other Ambulatory Visit: Payer: Self-pay

## 2018-11-25 ENCOUNTER — Encounter (HOSPITAL_COMMUNITY): Payer: Self-pay | Admitting: Hematology

## 2018-11-25 VITALS — BP 151/102 | HR 107 | Temp 98.2°F | Resp 18 | Ht 65.0 in | Wt 299.0 lb

## 2018-11-25 DIAGNOSIS — D72829 Elevated white blood cell count, unspecified: Secondary | ICD-10-CM

## 2018-11-25 LAB — CBC WITH DIFFERENTIAL/PLATELET
BLASTS: 0 %
Band Neutrophils: 0 %
Basophils Absolute: 0 10*3/uL (ref 0.0–0.1)
Basophils Relative: 0 %
Eosinophils Absolute: 0 10*3/uL (ref 0.0–0.5)
Eosinophils Relative: 0 %
HEMATOCRIT: 43.9 % (ref 36.0–46.0)
HEMOGLOBIN: 14.3 g/dL (ref 12.0–15.0)
Lymphocytes Relative: 23 %
Lymphs Abs: 4.3 10*3/uL — ABNORMAL HIGH (ref 0.7–4.0)
MCH: 24.8 pg — ABNORMAL LOW (ref 26.0–34.0)
MCHC: 32.6 g/dL (ref 30.0–36.0)
MCV: 76.1 fL — ABNORMAL LOW (ref 80.0–100.0)
MYELOCYTES: 0 %
Metamyelocytes Relative: 0 %
Monocytes Absolute: 1.1 10*3/uL — ABNORMAL HIGH (ref 0.1–1.0)
Monocytes Relative: 6 %
Neutro Abs: 13.3 10*3/uL — ABNORMAL HIGH (ref 1.7–7.7)
Neutrophils Relative %: 71 %
Other: 0 %
Platelets: 343 10*3/uL (ref 150–400)
Promyelocytes Relative: 0 %
RBC: 5.77 MIL/uL — ABNORMAL HIGH (ref 3.87–5.11)
RDW: 17.7 % — ABNORMAL HIGH (ref 11.5–15.5)
WBC: 18.7 10*3/uL — ABNORMAL HIGH (ref 4.0–10.5)
nRBC: 0 % (ref 0.0–0.2)
nRBC: 0 /100 WBC

## 2018-11-25 LAB — SAVE SMEAR(SSMR), FOR PROVIDER SLIDE REVIEW

## 2018-11-25 LAB — LACTATE DEHYDROGENASE: LDH: 142 U/L (ref 98–192)

## 2018-11-25 LAB — C-REACTIVE PROTEIN: CRP: 2.4 mg/dL — ABNORMAL HIGH (ref <1.0)

## 2018-11-25 LAB — SEDIMENTATION RATE: SED RATE: 12 mm/h (ref 0–22)

## 2018-11-25 NOTE — Patient Instructions (Addendum)
[place] at [facility]  Discharge Instructions:  You saw Dr. Delton Coombes today. He did an exam and discussed your recent history and family history with you. We will check blood work today. We will see you back in 3-4 weeks for follow up.  _______________________________________________________________  Thank you for choosing [place] at [facility] to provide your oncology and hematology care.  To afford each patient quality time with our providers, please arrive at least 15 minutes before your scheduled appointment.  You need to re-schedule your appointment if you arrive 10 or more minutes late.  We strive to give you quality time with our providers, and arriving late affects you and other patients whose appointments are after yours.  Also, if you no show three or more times for appointments you may be dismissed from the clinic.  Again, thank you for choosing [place] at [place] hope is that these requests will allow you access to exceptional care and in a timely manner. _______________________________________________________________  If you have questions after your visit, please contact our office at [phone] between the hours of 8:30 a.m. and 5:00 p.m. Voicemails left after 4:30 p.m. will not be returned until the following business day. _______________________________________________________________  For prescription refill requests, have your pharmacy contact our office. _______________________________________________________________  Recommendations made by the consultant and any test results will be sent to your referring physician. _______________________________________________________________

## 2018-11-25 NOTE — Progress Notes (Signed)
CONSULT NOTE  Patient Care Team: Rakes, Connye Burkitt, FNP as PCP - General (Family Medicine)  CHIEF COMPLAINTS/PURPOSE OF CONSULTATION:  Leukocytosis  HISTORY OF PRESENTING ILLNESS:  Patricia Stanton 33 y.o. female is seen in consultation today for further work-up and management of leukocytosis.  A CBC on 11/17/2018 shows elevated white count of 15.1.  This was predominantly neutrophilic leukocytosis.  There was also mild lymphocytosis.  She denied any infections immediately prior to that blood work.  She was reportedly treated with steroids for bronchitis in December.  She denies to be on chronic steroids.  She denies any dental infections.  She reportedly had recent left breast skin infection which subsided after antibiotics.  No other infections reported.  No family history of blood or other malignancies.  She never had a splenectomy.  Denies any fevers, night sweats or weight loss in the last 6 months.  Denies any history of connective tissue disorders.  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  She has been on disability due to health reasons for the past few years.  She smokes about half pack per day for past 11 years.  MEDICAL HISTORY:  Past Medical History:  Diagnosis Date  . Anxiety   . Arthritis    left knee  . Asthma   . Chronic abdominal pain   . Chronic headaches   . Depression   . Diabetes mellitus type 2 in obese ([employer] [place]) 11/20/2016  . Endometriosis   . HLD (hyperlipidemia) 02/17/2017  . Hypertension   . Mood disorder ([place])   . Obesity   . Ovarian cyst   . Tobacco abuse     SURGICAL HISTORY: Past Surgical History:  Procedure Laterality Date  . CESAREAN SECTION  2008   [place]  . CHOLECYSTECTOMY N/A 11/25/2014   Procedure: LAPAROSCOPIC CHOLECYSTECTOMY;  Surgeon: Aviva Signs Md, MD;  Location: [facility];  Service: General;  Laterality: N/A;  . ESOPHAGOGASTRODUODENOSCOPY N/A 12/21/2015   Dr. Gala Romney: normal esophagus, non-bleeding erosive gastropathy, normal second  portion of duodenum. Reactive gastropathy/chemical gastritis, negative H.pylori  . FEMUR FRACTURE SURGERY Left 1995   tree fell on her  . HERNIA REPAIR    . INCISIONAL HERNIA REPAIR N/A 01/29/2016   Procedure: Fatima Blank HERNIORRHAPHY WITH MESH;  Surgeon: Aviva Signs, MD;  Location: [facility];  Service: General;  Laterality: N/A;  . INSERTION OF MESH  01/29/2016   Procedure: INSERTION OF MESH;  Surgeon: Aviva Signs, MD;  Location: [facility];  Service: General;;  . KNEE ARTHROSCOPY Left   . TUBAL LIGATION      SOCIAL HISTORY: Social History   Socioeconomic History  . Marital status: Divorced    Spouse name: Not on file  . Number of children: 1  . Years of education: 44  . Highest education level: Not on file  Occupational History  . Occupation: disability  Social Needs  . Financial resource strain: Not hard at all  . Food insecurity:    Worry: Never true    Inability: Never true  . Transportation needs:    Medical: No    Non-medical: No  Tobacco Use  . Smoking status: Current Every Day Smoker    Packs/day: 0.50    Years: 11.00    Pack years: 5.50    Types: Cigarettes    Start date: 09/17/2003  . Smokeless tobacco: Never Used  Substance and Sexual Activity  . Alcohol use: No  . Drug use: No  . Sexual activity: Yes  Birth control/protection: Surgical    Comment: BTL  Lifestyle  . Physical activity:    Days per week: 7 days    Minutes per session: 10 min  . Stress: Rather much  Relationships  . Social connections:    Talks on phone: More than three times a week    Gets together: More than three times a week    Attends religious service: Never    Active member of club or organization: No    Attends meetings of clubs or organizations: Never    Relationship status: Divorced  . Intimate partner violence:    Fear of current or ex partner: No    Emotionally abused: No    Physically abused: No    Forced sexual activity: No  Other Topics Concern  . Not on file  Social  History Narrative   Disabled from nursing   Lives at home with daughter [name]    FAMILY HISTORY: Family History  Problem Relation Age of Onset  . Hypertension Mother   . Hyperlipidemia Mother   . Fibromyalgia Mother   . Other Mother        degenerative disc disease  . Arthritis Mother   . Kidney disease Mother   . Diabetes Father   . Hypertension Father   . Hyperlipidemia Father   . Heart disease Father 18  . Heart attack Father   . Stroke Father   . Hyperlipidemia Brother   . Hypertension Brother   . Aneurysm Maternal Grandmother        AAA  . Kidney disease Maternal Grandmother   . Kidney disease Maternal Grandfather   . Aneurysm Paternal Grandmother        AAA  . Kidney disease Paternal Grandmother   . Kidney disease Paternal Grandfather   . Colon cancer Neg Hx   . Inflammatory bowel disease Neg Hx     ALLERGIES:  has No Known Allergies.  MEDICATIONS:  Current Outpatient Medications  Medication Sig Dispense Refill  . albuterol (PROVENTIL HFA;VENTOLIN HFA) 108 (90 Base) MCG/ACT inhaler Inhale 2 puffs into the lungs every 6 (six) hours as needed for wheezing or shortness of breath. 1 Inhaler 2  . albuterol (PROVENTIL) (2.5 MG/3ML) 0.083% nebulizer solution Take 3 mLs (2.5 mg total) by nebulization every 6 (six) hours as needed for wheezing or shortness of breath. 150 mL 1  . budesonide-formoterol (SYMBICORT) 80-4.5 MCG/ACT inhaler Inhale 2 puffs into the lungs 2 (two) times daily. 1 Inhaler 3  . cetirizine (ZYRTEC) 10 MG tablet Take 1 tablet (10 mg total) by mouth daily. 30 tablet 11  . DULoxetine (CYMBALTA) 60 MG capsule Take 1 capsule (60 mg total) by mouth 2 (two) times daily. 180 capsule 1  . empagliflozin (JARDIANCE) 10 MG TABS tablet Take 10 mg by mouth daily. 30 tablet 4  . gabapentin (NEURONTIN) 300 MG capsule Take 300 mg by mouth as needed.     Marland Kitchen lisinopril-hydrochlorothiazide (PRINZIDE,ZESTORETIC) 10-12.5 MG tablet Take 1 tablet by mouth daily. 90 tablet 1   . LORazepam (ATIVAN) 1 MG tablet Take 1 tablet (1 mg total) daily as needed by mouth for anxiety. 30 tablet 1  . metFORMIN (GLUCOPHAGE XR) 500 MG 24 hr tablet Take 2 tablets (1,000 mg total) by mouth 2 (two) times daily. 360 tablet 1  . methocarbamol (ROBAXIN) 500 MG tablet TK 1 T PO TID PRN    . montelukast (SINGULAIR) 10 MG tablet Take 1 tablet (10 mg total) by mouth at bedtime. 30 tablet  3  . pravastatin (PRAVACHOL) 40 MG tablet Take 1 tablet (40 mg total) by mouth daily. 90 tablet 3  . traMADol (ULTRAM) 50 MG tablet tramadol 50 mg tablet  TAKE 1 TABLET BY MOUTH THREE TIMES DAILY AS NEEDED     No current facility-administered medications for this visit.     REVIEW OF SYSTEMS:   Constitutional: Denies fevers, chills or abnormal night sweats Eyes: Denies blurriness of vision, double vision or watery eyes Ears, nose, mouth, throat, and face: Denies mucositis or sore throat Respiratory: Denies cough, dyspnea or wheezes Cardiovascular: Denies palpitation, chest discomfort or lower extremity swelling Gastrointestinal:  Denies nausea, heartburn or change in bowel habits Skin: Denies abnormal skin rashes Lymphatics: Denies new lymphadenopathy or easy bruising Neurological:Denies numbness, tingling or new weaknesses Behavioral/Psych: Mood is stable, no new changes.  History of anxiety present. All other systems were reviewed with the patient and are negative.  PHYSICAL EXAMINATION: ECOG PERFORMANCE STATUS: 1 - Symptomatic but completely ambulatory  Vitals:   11/25/18 1250  BP: (!) 151/102  Pulse: (!) 107  Resp: 18  Temp: 98.2 F (36.8 C)  SpO2: 98%   Filed Weights   11/25/18 1250  Weight: 299 lb (135.6 kg)    GENERAL:alert, no distress and comfortable SKIN: skin color, texture, turgor are normal, no rashes or significant lesions EYES: normal, conjunctiva are pink and non-injected, sclera clear OROPHARYNX:no exudate, no erythema and lips, buccal mucosa, and tongue normal   NECK: supple, thyroid normal size, non-tender, without nodularity LYMPH:  no palpable lymphadenopathy in the cervical, axillary or inguinal LUNGS: clear to auscultation and percussion with normal breathing effort HEART: regular rate & rhythm and no murmurs and no lower extremity edema ABDOMEN:abdomen soft, non-tender and normal bowel sounds Musculoskeletal:no cyanosis of digits and no clubbing  PSYCH: alert & oriented x 3 with fluent speech NEURO: no focal motor/sensory deficits  LABORATORY DATA:  I have reviewed the data as listed Recent Results (from the past 2160 hour(s))  Urinalysis     Status: Abnormal   Collection Time: 10/01/18 12:05 PM  Result Value Ref Range   Specific Gravity, UA >1.030 (H) 1.005 - 1.030   pH, UA 6.0 5.0 - 7.5   Color, UA Yellow Yellow   Appearance Ur Clear Clear   Leukocytes, UA Negative Negative   Protein, UA 2+ (A) Negative/Trace   Glucose, UA Trace (A) Negative   Ketones, UA Trace (A) Negative   RBC, UA 3+ (A) Negative   Bilirubin, UA Negative Negative   Urobilinogen, Ur 0.2 0.2 - 1.0 mg/dL   Nitrite, UA Negative Negative  Urine Microscopic     Status: Abnormal   Collection Time: 10/01/18 12:40 PM  Result Value Ref Range   WBC, UA 6-10 (A) 0 - 5 /hpf   RBC, UA 11-30 (A) 0 - 2 /hpf   Epithelial Cells (non renal) 0-10 0 - 10 /hpf   Renal Epithel, UA None seen None seen /hpf   Mucus, UA Present Not Estab.   Bacteria, UA Moderate (A) None seen/Few  CBC with Differential/Platelet     Status: Abnormal   Collection Time: 10/01/18 12:40 PM  Result Value Ref Range   WBC 14.1 (H) 3.4 - 10.8 x10E3/uL   RBC 5.53 (H) 3.77 - 5.28 x10E6/uL   Hemoglobin 13.7 11.1 - 15.9 g/dL   Hematocrit 41.2 34.0 - 46.6 %   MCV 75 (L) 79 - 97 fL   MCH 24.8 (L) 26.6 - 33.0 pg   MCHC 33.3  31.5 - 35.7 g/dL   RDW 16.2 (H) 11.7 - 15.4 %    Comment:               **Please note reference interval change**   Platelets 327 150 - 450 x10E3/uL   Neutrophils 65 Not Estab. %    Lymphs 27 Not Estab. %   Monocytes 6 Not Estab. %   Eos 1 Not Estab. %   Basos 0 Not Estab. %   Neutrophils Absolute 9.2 (H) 1.4 - 7.0 x10E3/uL   Lymphocytes Absolute 3.8 (H) 0.7 - 3.1 x10E3/uL   Monocytes Absolute 0.9 0.1 - 0.9 x10E3/uL   EOS (ABSOLUTE) 0.1 0.0 - 0.4 x10E3/uL   Basophils Absolute 0.1 0.0 - 0.2 x10E3/uL   Immature Granulocytes 1 Not Estab. %   Immature Grans (Abs) 0.1 0.0 - 0.1 x10E3/uL  CMP14+EGFR     Status: Abnormal   Collection Time: 10/01/18 12:40 PM  Result Value Ref Range   Glucose 158 (H) 65 - 99 mg/dL   BUN 7 6 - 20 mg/dL   Creatinine, Ser 0.62 0.57 - 1.00 mg/dL   GFR calc non Af Amer 119 >59 mL/min/1.73   GFR calc Af Amer 137 >59 mL/min/1.73   BUN/Creatinine Ratio 11 9 - 23   Sodium 136 134 - 144 mmol/L   Potassium 4.0 3.5 - 5.2 mmol/L   Chloride 100 96 - 106 mmol/L   CO2 19 (L) 20 - 29 mmol/L   Calcium 9.0 8.7 - 10.2 mg/dL   Total Protein 7.0 6.0 - 8.5 g/dL   Albumin 4.2 3.5 - 5.5 g/dL    Comment:     **Effective October 05, 2018 Albumin reference**       interval will be changing to:              Age                Female          Female           0 -  7 days        3.6 - 4.9      3.6 - 4.9           8 - 30 days        3.4 - 4.7      3.4 - 4.7           1 -  6 month       3.7 - 4.8      3.7 - 4.8    7 months -  2 years       3.9 - 5.0      3.9 - 5.0           3 -  5 years       4.0 - 5.0      4.0 - 5.0           6 - 12 years       4.1 - 5.0      4.0 - 5.0          13 - 30 years       4.1 - 5.2      3.9 - 5.0          31 - 50 years       4.0 - 5.0      3.8 - 4.8          51 - 60 years  3.8 - 4.9      3.8 - 4.9          61 - 70 years       3.8 - 4.8      3.8 - 4.8          71 - 80 years       3.7 - 4.7      3.7 - 4.7          81 - 89 years       3.6 - 4.6      3.6 - 4.6              >89 years       3.5 - 4.6      3.5 - 4.6    Globulin, Total 2.8 1.5 - 4.5 g/dL   Albumin/Globulin Ratio 1.5 1.2 - 2.2   Bilirubin Total 0.3 0.0 - 1.2 mg/dL    Alkaline Phosphatase 87 39 - 117 IU/L   AST 41 (H) 0 - 40 IU/L   ALT 48 (H) 0 - 32 IU/L  Bayer DCA Hb A1c Waived     Status: Abnormal   Collection Time: 10/01/18 12:40 PM  Result Value Ref Range   HB A1C (BAYER DCA - WAIVED) 8.6 (H) <7.0 %    Comment:                                       Diabetic Adult            <7.0                                       Healthy Adult        4.3 - 5.7                                                           (DCCT/NGSP) American Diabetes Association's Summary of Glycemic Recommendations for Adults with Diabetes: Hemoglobin A1c <7.0%. More stringent glycemic goals (A1c <6.0%) may further reduce complications at the cost of increased risk of hypoglycemia.   Urine Culture     Status: Abnormal   Collection Time: 10/01/18  1:26 PM  Result Value Ref Range   Urine Culture, Routine Final report (A)    Organism ID, Bacteria Comment (A)     Comment: Beta hemolytic Streptococcus, group B 10,000-25,000 colony forming units per mL Penicillin and ampicillin are drugs of choice for treatment of beta-hemolytic streptococcal infections. Susceptibility testing of penicillins and other beta-lactam agents approved by the FDA for treatment of beta-hemolytic streptococcal infections need not be performed routinely because nonsusceptible isolates are extremely rare in any beta-hemolytic streptococcus and have not been reported for Streptococcus pyogenes (group A). (CLSI)    ORGANISM ID, BACTERIA Comment     Comment: Mixed urogenital flora 10,000-25,000 colony forming units per mL   Protein / Creatinine Ratio, Urine     Status: None   Collection Time: 10/01/18  1:27 PM  Result Value Ref Range   Creatinine, Urine 374.9 Not Estab. mg/dL   Protein, Ur 57.6 Not Estab. mg/dL   Protein/Creat Ratio  154 0 - 200 mg/g creat  CBC with Differential/Platelet     Status: Abnormal   Collection Time: 11/17/18 10:38 AM  Result Value Ref Range   WBC 15.1 (H) 3.4 - 10.8 x10E3/uL    RBC 5.89 (H) 3.77 - 5.28 x10E6/uL   Hemoglobin 14.1 11.1 - 15.9 g/dL   Hematocrit 43.8 34.0 - 46.6 %   MCV 74 (L) 79 - 97 fL   MCH 23.9 (L) 26.6 - 33.0 pg   MCHC 32.2 31.5 - 35.7 g/dL   RDW 17.4 (H) 11.7 - 15.4 %   Platelets 351 150 - 450 x10E3/uL   Neutrophils 69 Not Estab. %   Lymphs 24 Not Estab. %   Monocytes 5 Not Estab. %   Eos 1 Not Estab. %   Basos 0 Not Estab. %   Neutrophils Absolute 10.5 (H) 1.4 - 7.0 x10E3/uL   Lymphocytes Absolute 3.6 (H) 0.7 - 3.1 x10E3/uL   Monocytes Absolute 0.7 0.1 - 0.9 x10E3/uL   EOS (ABSOLUTE) 0.1 0.0 - 0.4 x10E3/uL   Basophils Absolute 0.1 0.0 - 0.2 x10E3/uL   Immature Granulocytes 1 Not Estab. %   Immature Grans (Abs) 0.1 0.0 - 0.1 x10E3/uL  CMP14+EGFR     Status: Abnormal   Collection Time: 11/17/18 10:38 AM  Result Value Ref Range   Glucose 195 (H) 65 - 99 mg/dL   BUN 6 6 - 20 mg/dL   Creatinine, Ser 0.60 0.57 - 1.00 mg/dL   GFR calc non Af Amer 120 >59 mL/min/1.73   GFR calc Af Amer 139 >59 mL/min/1.73   BUN/Creatinine Ratio 10 9 - 23   Sodium 140 134 - 144 mmol/L   Potassium 4.1 3.5 - 5.2 mmol/L   Chloride 101 96 - 106 mmol/L   CO2 19 (L) 20 - 29 mmol/L   Calcium 9.4 8.7 - 10.2 mg/dL   Total Protein 6.8 6.0 - 8.5 g/dL   Albumin 4.4 3.8 - 4.8 g/dL   Globulin, Total 2.4 1.5 - 4.5 g/dL   Albumin/Globulin Ratio 1.8 1.2 - 2.2   Bilirubin Total 0.2 0.0 - 1.2 mg/dL   Alkaline Phosphatase 102 39 - 117 IU/L   AST 24 0 - 40 IU/L   ALT 35 (H) 0 - 32 IU/L  Lipid panel     Status: Abnormal   Collection Time: 11/17/18 10:38 AM  Result Value Ref Range   Cholesterol, Total 199 100 - 199 mg/dL   Triglycerides 226 (H) 0 - 149 mg/dL   HDL 34 (L) >39 mg/dL   VLDL Cholesterol Cal 45 (H) 5 - 40 mg/dL   LDL Calculated 120 (H) 0 - 99 mg/dL   Chol/HDL Ratio 5.9 (H) 0.0 - 4.4 ratio    Comment:                                   T. Chol/HDL Ratio                                             Men  Women                               1/2 Avg.Risk   3.4    3.3  Avg.Risk  5.0    4.4                                2X Avg.Risk  9.6    7.1                                3X Avg.Risk 23.4   11.0   TSH     Status: None   Collection Time: 11/17/18 10:38 AM  Result Value Ref Range   TSH 2.330 0.450 - 4.500 uIU/mL  Bayer DCA Hb A1c Waived     Status: Abnormal   Collection Time: 11/17/18 10:39 AM  Result Value Ref Range   HB A1C (BAYER DCA - WAIVED) 8.0 (H) <7.0 %    Comment:                                       Diabetic Adult            <7.0                                       Healthy Adult        4.3 - 5.7                                                           (DCCT/NGSP) American Diabetes Association's Summary of Glycemic Recommendations for Adults with Diabetes: Hemoglobin A1c <7.0%. More stringent glycemic goals (A1c <6.0%) may further reduce complications at the cost of increased risk of hypoglycemia.   IGP, Aptima HPV, rfx 16/18,45     Status: Abnormal   Collection Time: 11/17/18 10:39 AM  Result Value Ref Range   Interpretation NILM     Comment: NEGATIVE FOR INTRAEPITHELIAL LESION OR MALIGNANCY.   Category NIL     Comment: Negative for Intraepithelial Lesion   Adequacy ENDO     Comment: Satisfactory for evaluation. Endocervical and/or squamous metaplastic cells (endocervical component) are present.    Clinician Provided ICD10 Comment     Comment: Z00.00   Performed by: Comment     Comment: Pat Patrick, Cytotechnologist (ASCP)   Note: Comment     Comment: The Pap smear is a screening test designed to aid in the detection of premalignant and malignant conditions of the uterine cervix.  It is not a diagnostic procedure and should not be used as the sole means of detecting cervical cancer.  Both false-positive and false-negative reports do occur.    Test Methodology Comment     Comment: This liquid based ThinPrep(R) pap test was screened with the use of an image guided system.     HPV Aptima Positive (A) Negative    Comment: This nucleic acid amplification test detects fourteen high-risk HPV types (16,18,31,33,35,39,45,51,52,56,58,59,66,68) without differentiation.    HPV Genotype 16 Negative Negative   HPV Genotype 18,45 Negative Negative  Microalbumin / creatinine urine ratio     Status: None   Collection Time: 11/17/18 11:16 AM  Result Value Ref Range   Creatinine,  Urine 54.7 Not Estab. mg/dL   Microalbumin, Urine 13.6 Not Estab. ug/mL   Microalb/Creat Ratio 25 0 - 29 mg/g creat    Comment:                        Normal:                0 -  29                        Moderately increased: 30 - 300                        Severely increased:       >300               **Please note reference interval change**   CBC with Differential     Status: Abnormal (Preliminary result)   Collection Time: 11/25/18  2:05 PM  Result Value Ref Range   WBC 18.7 (H) 4.0 - 10.5 K/uL   RBC 5.77 (H) 3.87 - 5.11 MIL/uL   Hemoglobin 14.3 12.0 - 15.0 g/dL   HCT 43.9 36.0 - 46.0 %   MCV 76.1 (L) 80.0 - 100.0 fL   MCH 24.8 (L) 26.0 - 34.0 pg   MCHC 32.6 30.0 - 36.0 g/dL   RDW 17.7 (H) 11.5 - 15.5 %   Platelets 343 150 - 400 K/uL   nRBC 0.0 0.0 - 0.2 %    Comment: Performed at [facility], [address], [place], [place] [address]   Neutrophils Relative % PENDING %   Neutro Abs PENDING 1.7 - 7.7 K/uL   Band Neutrophils PENDING %   Lymphocytes Relative PENDING %   Lymphs Abs PENDING 0.7 - 4.0 K/uL   Monocytes Relative PENDING %   Monocytes Absolute PENDING 0.1 - 1.0 K/uL   Eosinophils Relative PENDING %   Eosinophils Absolute PENDING 0.0 - 0.5 K/uL   Basophils Relative PENDING %   Basophils Absolute PENDING 0.0 - 0.1 K/uL   WBC Morphology PENDING    RBC Morphology PENDING    Smear Review PENDING    Other PENDING %   nRBC PENDING 0 /100 WBC   Metamyelocytes Relative PENDING %   Myelocytes PENDING %   Promyelocytes Relative PENDING %   Blasts PENDING %    RADIOGRAPHIC  STUDIES: I have personally reviewed the radiological images as listed and agreed with the findings in the report.   ASSESSMENT & PLAN:  Leukocytosis 1.  Leukocytosis: - CBC on 11/17/2018 shows elevated white count of 15.1.  This was predominantly neutrophilic leukocytosis although lymphocytes were slightly elevated.  Hemoglobin and platelets were within normal limits. - She denies any fevers, night sweats or weight loss in the last 6 months. -Denies any recurrent infections.  Reportedly took steroids in December. -No family history of blood or other malignancies. -CT of the abdomen and pelvis reviewed by me from 09/22/2017 shows fatty liver with left adrenal nodule enlarged at 2.5 cm.  A prior MRI showed benign adenoma. - She smokes half pack per day for the past 11 years.  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  Physical exam today did not reveal any palpable adenopathy or splenomegaly. - I had a prolonged discussion with the patient about various causes of leukocytosis.  We will review her smear today.  I will check an LDH level.  We will send BCR/ABL by FISH and Jak 2 mutation testing with reflex testing.  We will also check ANA and rheumatoid factor. - We will see her back in 3 to 4 weeks for follow-up.  Total time spent is 45 minutes with more than 50% of the time spent face-to-face discussing her lab work, differential diagnosis, work-up plan and coordination of care.   All questions were answered. The patient knows to call the clinic with any problems, questions or concerns.     Derek Jack, MD 11/25/18 2:21 PM

## 2018-11-25 NOTE — Assessment & Plan Note (Signed)
1.  Leukocytosis: - CBC on 11/17/2018 shows elevated white count of 15.1.  This was predominantly neutrophilic leukocytosis although lymphocytes were slightly elevated.  Hemoglobin and platelets were within normal limits. - She denies any fevers, night sweats or weight loss in the last 6 months. -Denies any recurrent infections.  Reportedly took steroids in December. -No family history of blood or other malignancies. -CT of the abdomen and pelvis reviewed by me from 09/22/2017 shows fatty liver with left adrenal nodule enlarged at 2.5 cm.  A prior MRI showed benign adenoma. - She smokes half pack per day for the past 11 years.  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  Physical exam today did not reveal any palpable adenopathy or splenomegaly. - I had a prolonged discussion with the patient about various causes of leukocytosis.  We will review her smear today.  I will check an LDH level.  We will send BCR/ABL by FISH and Jak 2 mutation testing with reflex testing.  We will also check ANA and rheumatoid factor. - We will see her back in 3 to 4 weeks for follow-up.

## 2018-11-26 LAB — ANTINUCLEAR ANTIBODIES, IFA: ANTINUCLEAR ANTIBODIES, IFA: NEGATIVE

## 2018-11-26 LAB — RHEUMATOID FACTOR: Rheumatoid fact SerPl-aCnc: 11.1 IU/mL (ref 0.0–13.9)

## 2018-12-04 LAB — CALR + JAK2 E12-15 + MPL (REFLEXED)

## 2018-12-04 LAB — JAK2 V617F, W REFLEX TO CALR/E12/MPL

## 2018-12-07 LAB — BCR-ABL1 FISH
Cells Analyzed: 200
Cells Counted: 200

## 2018-12-22 ENCOUNTER — Other Ambulatory Visit: Payer: Self-pay

## 2018-12-22 ENCOUNTER — Encounter (HOSPITAL_COMMUNITY): Payer: Self-pay | Admitting: Hematology

## 2018-12-22 ENCOUNTER — Inpatient Hospital Stay (HOSPITAL_COMMUNITY): Payer: Medicaid Other | Attending: Hematology | Admitting: Hematology

## 2018-12-22 DIAGNOSIS — D72829 Elevated white blood cell count, unspecified: Secondary | ICD-10-CM

## 2018-12-22 NOTE — Progress Notes (Signed)
Virtual Visit via Telephone Note  I connected with [employer] on 12/22/18 at 10:45 AM EDT by telephone and verified that I am speaking with the correct person using two identifiers.   I discussed the limitations, risks, security and privacy concerns of performing an evaluation and management service by telephone and the availability of in person appointments. I also discussed with the patient that there may be a patient responsible charge related to this service. The patient expressed understanding and agreed to proceed.   History of Present Illness: Neutrophilic and lymphocytic leukocytosis. Half pack per day smoker for the past 11 years. Left adrenal nodule on CT scan dated 09/22/2017.   Observations/Objective: She denies any fevers, night sweats or weight loss.  Denies any recent infections or hospitalizations.  Denies any recent use of antibiotics.   Assessment and Plan: I reviewed results of her blood work.  Repeat CBC showed white count of 18.7.  Differential showed elevated neutrophils and lymphocytes.  Myeloproliferative disorder work-up for BCR/ABL and Jak 2 mutation testing was negative.  LDH was normal. 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  However she needs a bone marrow biopsy to rule out underlying bone marrow process. Patient is reluctant to consider bone marrow biopsy until the COVID-19 situation resolves.  Hence I will follow her in 3 months and revisit the plan.  Follow Up Instructions: RTC 3 months.   I discussed the assessment and treatment plan with the patient. The patient was provided an opportunity to ask questions and all were answered. The patient agreed with the plan and demonstrated an understanding of the instructions.   The patient was advised to call back or seek an in-person evaluation if the symptoms worsen or if the condition fails to improve as anticipated.  I provided 10 minutes of non-face-to-face time during this  encounter.   Derek Jack, MD
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
## 2019-03-02 ENCOUNTER — Other Ambulatory Visit: Payer: Self-pay | Admitting: Family Medicine

## 2019-03-02 DIAGNOSIS — J441 Chronic obstructive pulmonary disease with (acute) exacerbation: Secondary | ICD-10-CM

## 2019-03-02 DIAGNOSIS — R05 Cough: Secondary | ICD-10-CM

## 2019-03-02 DIAGNOSIS — R059 Cough, unspecified: Secondary | ICD-10-CM

## 2019-03-02 MED ORDER — ALBUTEROL SULFATE HFA 108 (90 BASE) MCG/ACT IN AERS: 2.0000 | INHALATION_SPRAY | Freq: Four times a day (QID) | RESPIRATORY_TRACT | 1 refills | Status: DC | PRN

## 2019-03-02 MED ORDER — BUDESONIDE-FORMOTEROL FUMARATE 80-4.5 MCG/ACT IN AERO: 2.0000 | INHALATION_SPRAY | Freq: Two times a day (BID) | RESPIRATORY_TRACT | 1 refills | Status: DC

## 2019-03-02 NOTE — Telephone Encounter (Signed)
Pt aware refills sent to pharmacy 

## 2019-03-03 ENCOUNTER — Ambulatory Visit (INDEPENDENT_AMBULATORY_CARE_PROVIDER_SITE_OTHER): Payer: Medicaid Other | Admitting: Psychiatry

## 2019-03-03 ENCOUNTER — Encounter (HOSPITAL_COMMUNITY): Payer: Self-pay | Admitting: Psychiatry

## 2019-03-03 ENCOUNTER — Other Ambulatory Visit: Payer: Self-pay

## 2019-03-03 DIAGNOSIS — F419 Anxiety disorder, unspecified: Secondary | ICD-10-CM

## 2019-03-03 DIAGNOSIS — F4310 Post-traumatic stress disorder, unspecified: Secondary | ICD-10-CM

## 2019-03-03 DIAGNOSIS — F331 Major depressive disorder, recurrent, moderate: Secondary | ICD-10-CM | POA: Diagnosis not present

## 2019-03-03 MED ORDER — LORAZEPAM 0.5 MG PO TABS: 0.5000 mg | ORAL_TABLET | Freq: Every day | ORAL | 0 refills | Status: DC | PRN

## 2019-03-03 MED ORDER — DULOXETINE HCL 60 MG PO CPEP: 60.0000 mg | ORAL_CAPSULE | Freq: Two times a day (BID) | ORAL | 0 refills | Status: DC

## 2019-03-03 MED ORDER — PRAZOSIN HCL 1 MG PO CAPS: 1.0000 mg | ORAL_CAPSULE | Freq: Every day | ORAL | 0 refills | Status: DC

## 2019-03-03 NOTE — Patient Instructions (Signed)
1. Reiniate duloxetine 60 mg twice a day  2. Reinitiate prazosin 1 mg at night  3. Reinitiate lorazepam 0.5 mg daily as needed for anxiety 4. Referral to therapy  4. Next appointment: 7/15 at 9:20

## 2019-03-10 DIAGNOSIS — M5136 Other intervertebral disc degeneration, lumbar region: Secondary | ICD-10-CM | POA: Diagnosis not present

## 2019-03-24 ENCOUNTER — Other Ambulatory Visit: Payer: Self-pay

## 2019-03-24 ENCOUNTER — Telehealth: Payer: Self-pay | Admitting: Family Medicine

## 2019-03-25 ENCOUNTER — Encounter: Payer: Self-pay | Admitting: Family Medicine

## 2019-03-25 ENCOUNTER — Ambulatory Visit: Payer: Medicaid Other | Admitting: Family Medicine

## 2019-03-25 VITALS — BP 124/87 | HR 111 | Temp 98.7°F | Ht 65.0 in | Wt 297.0 lb

## 2019-03-25 DIAGNOSIS — E669 Obesity, unspecified: Secondary | ICD-10-CM

## 2019-03-25 DIAGNOSIS — G8929 Other chronic pain: Secondary | ICD-10-CM

## 2019-03-25 DIAGNOSIS — E785 Hyperlipidemia, unspecified: Secondary | ICD-10-CM

## 2019-03-25 DIAGNOSIS — J410 Simple chronic bronchitis: Secondary | ICD-10-CM | POA: Diagnosis not present

## 2019-03-25 DIAGNOSIS — I129 Hypertensive chronic kidney disease with stage 1 through stage 4 chronic kidney disease, or unspecified chronic kidney disease: Secondary | ICD-10-CM | POA: Diagnosis not present

## 2019-03-25 DIAGNOSIS — R198 Other specified symptoms and signs involving the digestive system and abdomen: Secondary | ICD-10-CM | POA: Insufficient documentation

## 2019-03-25 DIAGNOSIS — E1169 Type 2 diabetes mellitus with other specified complication: Secondary | ICD-10-CM

## 2019-03-25 DIAGNOSIS — R059 Cough, unspecified: Secondary | ICD-10-CM | POA: Insufficient documentation

## 2019-03-25 DIAGNOSIS — R109 Unspecified abdominal pain: Secondary | ICD-10-CM | POA: Diagnosis not present

## 2019-03-25 DIAGNOSIS — R05 Cough: Secondary | ICD-10-CM | POA: Insufficient documentation

## 2019-03-25 DIAGNOSIS — E1122 Type 2 diabetes mellitus with diabetic chronic kidney disease: Secondary | ICD-10-CM | POA: Diagnosis not present

## 2019-03-25 HISTORY — DX: Type 2 diabetes mellitus with diabetic chronic kidney disease: E11.22

## 2019-03-25 HISTORY — DX: Hypertensive chronic kidney disease with stage 1 through stage 4 chronic kidney disease, or unspecified chronic kidney disease: I12.9

## 2019-03-25 LAB — BAYER DCA HB A1C WAIVED: HB A1C (BAYER DCA - WAIVED): 7.8 % — ABNORMAL HIGH (ref <7.0)

## 2019-03-25 MED ORDER — LISINOPRIL-HYDROCHLOROTHIAZIDE 10-12.5 MG PO TABS: 1.0000 | ORAL_TABLET | Freq: Every day | ORAL | 1 refills | Status: DC

## 2019-03-25 MED ORDER — ALBUTEROL SULFATE HFA 108 (90 BASE) MCG/ACT IN AERS: 2.0000 | INHALATION_SPRAY | Freq: Four times a day (QID) | RESPIRATORY_TRACT | 6 refills | Status: DC | PRN

## 2019-03-25 MED ORDER — BUDESONIDE-FORMOTEROL FUMARATE 80-4.5 MCG/ACT IN AERO: 2.0000 | INHALATION_SPRAY | Freq: Two times a day (BID) | RESPIRATORY_TRACT | 1 refills | Status: DC

## 2019-03-25 MED ORDER — MONTELUKAST SODIUM 10 MG PO TABS: 10.0000 mg | ORAL_TABLET | Freq: Every day | ORAL | 3 refills | Status: DC

## 2019-03-25 MED ORDER — METFORMIN HCL ER 500 MG PO TB24: 1000.0000 mg | ORAL_TABLET | Freq: Two times a day (BID) | ORAL | 1 refills | Status: DC

## 2019-03-25 MED ORDER — EMPAGLIFLOZIN 10 MG PO TABS: 10.0000 mg | ORAL_TABLET | Freq: Every day | ORAL | 4 refills | Status: DC

## 2019-03-25 MED ORDER — ALBUTEROL SULFATE (2.5 MG/3ML) 0.083% IN NEBU: 2.5000 mg | INHALATION_SOLUTION | Freq: Four times a day (QID) | RESPIRATORY_TRACT | 1 refills | Status: DC | PRN

## 2019-03-25 NOTE — Patient Instructions (Signed)
It was a pleasure seeing you today, Patricia Stanton.  Information regarding what we discussed is included in this packet.  Please make an appointment to see me in 3 months.   In a few days you may receive a survey in the mail or online from [employer] regarding your visit with [place] today. Please take a moment to fill this out. Your feedback is very important to our office. It can help [place] better understand your needs as well as improve your experience and satisfaction. Thank you for taking your time to complete it. We care about you.  Because of recent events of COVID-19 ("Coronavirus"), please follow CDC recommendations:   1. Wash your hand frequently 2. Avoid touching your face 3. Stay away from people who are sick 4. If you have symptoms such as fever, cough, shortness of breath then call your healthcare provider for further guidance 5. If you are sick, STAY AT HOME, unless otherwise directed by your healthcare provider. 6. Follow directions from state and national officials regarding staying safe    Please feel free to call our office if any questions or concerns arise.  Warm Regards, Monia Pouch, FNP-C [facility] [address] [place], [place] [address] [phone]

## 2019-03-25 NOTE — Progress Notes (Signed)
Subjective:  Patient ID: Patricia Stanton, female    DOB: 16-Jun-1986, 33 y.o.   MRN: [id number]  Chief Complaint:  Medical Management of Chronic Issues   HPI: Patricia Stanton is a 33 y.o. female presenting on 03/25/2019 for Medical Management of Chronic Issues   1. Diabetes mellitus type 2 in obese (HCC)  Diabetes mellitus 2 Compliant with meds - Yes Checking CBGs? Yes  Fasting avg -  100  Postprandial average - 150 Exercising regularly? - Yes Watching carbohydrate intake? - Yes Neuropathy ? - No Hypoglycemic events - No Pertinent ROS:  Polyuria - No Polydipsia - No Vision problems - No   2. Morbid obesity ([place])  Has been doing very well with diet. Has lost several pounds with new diet. Is trying to exercise on a regular basis.    3. Hyperlipidemia associated with type 2 diabetes mellitus ([place])  Compliant with medications without associated side effects. Has been watching diet and exercising more often.    4. Simple chronic bronchitis ([place])   5. Cough  Doing well on current medications. No shortness of breath, increased cough, or wheezing. No sputum production. Does still smoke 1/2 - 1 PPD. Is trying to cut back.   6. Hypertension 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 ([place])  Complaint with meds - Yes Checking BP at home - No Exercising Regularly - Yes Watching Salt intake - Yes Pertinent ROS:  Headache - No Chest pain - No Dyspnea - No Palpitations - No LE edema - No They report good compliance with medications and can restate their regimen by memory. No medication side effects.  BP Readings from Last 3 Encounters:  03/25/19 124/87  11/25/18 (!) 151/102  11/17/18 (!) 151/99     7. Chronic abdominal pain   8. Abnormal bowel movement  Ongoing abdominal discomfort with constipation and diarrhea. Has tried Linzess in the past and was unhappy with results. States she has seen GI in the past but was not happy with the provider and  would like a new referral. No hematochezia or melena. No nausea or vomiting.      Relevant past medical, surgical, family, and social history reviewed and updated as indicated.  Allergies and medications reviewed and updated.   Past Medical History:  Diagnosis Date  . Anxiety   . Arthritis    left knee  . Asthma   . Chronic abdominal pain   . Chronic headaches   . Depression   . Diabetes mellitus type 2 in obese ([place]) 11/20/2016  . Endometriosis   . HLD (hyperlipidemia) 02/17/2017  . Hypertension   . 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 Mood disorder ([place])   . Obesity   . Ovarian cyst   . Tobacco abuse     Past Surgical History:  Procedure Laterality Date  . CESAREAN SECTION  2008   [place]  . CHOLECYSTECTOMY N/A 11/25/2014   Procedure: LAPAROSCOPIC CHOLECYSTECTOMY;  Surgeon: Aviva Signs Md, MD;  Location: [facility];  Service: General;  Laterality: N/A;  . ESOPHAGOGASTRODUODENOSCOPY N/A 12/21/2015   Dr. Gala Romney: normal esophagus, non-bleeding erosive gastropathy, normal second portion of duodenum. Reactive gastropathy/chemical gastritis, negative H.pylori  . FEMUR FRACTURE SURGERY Left 1995   tree fell on her  . HERNIA REPAIR    . INCISIONAL HERNIA REPAIR N/A 01/29/2016   Procedure: Fatima Blank HERNIORRHAPHY WITH MESH;  Surgeon: Aviva Signs, MD;  Location: [facility];  Service: General;  Laterality: N/A;  . INSERTION OF MESH  01/29/2016   Procedure: INSERTION OF MESH;  Surgeon: Aviva Signs, MD;  Location: [facility];  Service: General;;  . KNEE ARTHROSCOPY Left   . TUBAL LIGATION      Social History   Socioeconomic History  . Marital status: Divorced    Spouse name: Not on file  . Number of children: 1  . Years of education: 26  . Highest education level: Not on file  Occupational History  . Occupation: disability  Social Needs  . Financial resource strain: Not hard at all  . Food insecurity    Worry: Never true     Inability: Never true  . Transportation needs    Medical: No    Non-medical: No  Tobacco Use  . Smoking status: Current Every Day Smoker    Packs/day: 0.50    Years: 11.00    Pack years: 5.50    Types: Cigarettes    Start date: 09/17/2003  . Smokeless tobacco: Never Used  Substance and Sexual Activity  . Alcohol use: No  . Drug use: No  . Sexual activity: Yes    Birth control/protection: Surgical    Comment: BTL  Lifestyle  . Physical activity    Days per week: 7 days    Minutes per session: 10 min  . Stress: Rather much  Relationships  . Social connections    Talks on phone: More than three times a week    Gets together: More than three times a week    Attends religious service: Never    Active member of club or organization: No    Attends meetings of clubs or organizations: Never    Relationship status: Divorced  . Intimate partner violence    Fear of current or ex partner: No    Emotionally abused: No    Physically abused: No    Forced sexual activity: No  Other Topics Concern  . Not on file  Social History Narrative   Disabled from nursing   Lives at home with daughter [name]    Outpatient Encounter Medications as of 03/25/2019  Medication Sig  . albuterol (PROVENTIL) (2.5 MG/3ML) 0.083% nebulizer solution Take 3 mLs (2.5 mg total) by nebulization every 6 (six) hours as needed for wheezing or shortness of breath.  Patricia Stanton Kitchen albuterol (VENTOLIN HFA) 108 (90 Base) MCG/ACT inhaler Inhale 2 puffs into the lungs every 6 (six) hours as needed for wheezing or shortness of breath.  . budesonide-formoterol (SYMBICORT) 80-4.5 MCG/ACT inhaler Inhale 2 puffs into the lungs 2 (two) times daily.  . cetirizine (ZYRTEC) 10 MG tablet Take 1 tablet (10 mg total) by mouth daily.  . DULoxetine (CYMBALTA) 60 MG capsule Take 1 capsule (60 mg total) by mouth 2 (two) times daily.  . empagliflozin (JARDIANCE) 10 MG TABS tablet Take 10 mg by mouth daily.  Patricia Stanton Kitchen gabapentin (NEURONTIN) 300 MG capsule  Take 300 mg by mouth as needed.   Patricia Stanton Kitchen lisinopril-hydrochlorothiazide (ZESTORETIC) 10-12.5 MG tablet Take 1 tablet by mouth daily.  Patricia Stanton Kitchen LORazepam (ATIVAN) 0.5 MG tablet Take 1 tablet (0.5 mg total) by mouth daily as needed for anxiety.  . metFORMIN (GLUCOPHAGE XR) 500 MG 24 hr tablet Take 2 tablets (1,000 mg total) by mouth 2 (two) times daily.  . methocarbamol (ROBAXIN) 500 MG tablet TK 1 T PO TID PRN  . montelukast (SINGULAIR) 10 MG tablet Take 1 tablet (10 mg total) by mouth at bedtime.  . pravastatin (PRAVACHOL) 40 MG  tablet Take 1 tablet (40 mg total) by mouth daily.  . prazosin (MINIPRESS) 1 MG capsule Take 1 capsule (1 mg total) by mouth at bedtime.  . [DISCONTINUED] albuterol (PROVENTIL) (2.5 MG/3ML) 0.083% nebulizer solution Take 3 mLs (2.5 mg total) by nebulization every 6 (six) hours as needed for wheezing or shortness of breath.  . [DISCONTINUED] albuterol (VENTOLIN HFA) 108 (90 Base) MCG/ACT inhaler Inhale 2 puffs into the lungs every 6 (six) hours as needed for wheezing or shortness of breath.  . [DISCONTINUED] budesonide-formoterol (SYMBICORT) 80-4.5 MCG/ACT inhaler Inhale 2 puffs into the lungs 2 (two) times daily.  . [DISCONTINUED] empagliflozin (JARDIANCE) 10 MG TABS tablet Take 10 mg by mouth daily.  . [DISCONTINUED] lisinopril-hydrochlorothiazide (PRINZIDE,ZESTORETIC) 10-12.5 MG tablet Take 1 tablet by mouth daily.  . [DISCONTINUED] metFORMIN (GLUCOPHAGE XR) 500 MG 24 hr tablet Take 2 tablets (1,000 mg total) by mouth 2 (two) times daily.  . [DISCONTINUED] montelukast (SINGULAIR) 10 MG tablet Take 1 tablet (10 mg total) by mouth at bedtime.  . traMADol (ULTRAM) 50 MG tablet tramadol 50 mg tablet  TAKE 1 TABLET BY MOUTH THREE TIMES DAILY AS NEEDED  . [DISCONTINUED] topiramate (TOPAMAX) 50 MG tablet Take by mouth.   No facility-administered encounter medications on file as of 03/25/2019.     No Known Allergies  Review of Systems  Constitutional: Positive for activity change.  Negative for appetite change, chills, diaphoresis, fatigue, fever and unexpected weight change.  Eyes: Negative for photophobia and visual disturbance.  Respiratory: Positive for cough (intermittent) and shortness of breath (intermittent, with exertion).   Cardiovascular: Negative for chest pain and palpitations.  Gastrointestinal: Positive for abdominal pain, constipation and diarrhea. Negative for abdominal distention, anal bleeding, blood in stool, nausea, rectal pain and vomiting.  Endocrine: Negative for cold intolerance, heat intolerance, polydipsia, polyphagia and polyuria.  Genitourinary: Negative for decreased urine volume and difficulty urinating.  Musculoskeletal: Positive for arthralgias and myalgias. Negative for joint swelling.  Skin: Negative for color change and pallor.  Neurological: Negative for dizziness, tremors, seizures, syncope, facial asymmetry, speech difficulty, weakness, light-headedness, numbness and headaches.  Hematological: Does not bruise/bleed easily.  Psychiatric/Behavioral: Negative for confusion.  All other systems reviewed and are negative.       Objective:  BP 124/87   Pulse (!) 111   Temp 98.7 F (37.1 C) (Oral)   Ht '5\' 5"'$  (1.651 m)   Wt 297 lb (134.7 kg)   SpO2 98%   BMI 49.42 kg/m    Wt Readings from Last 3 Encounters:  03/25/19 297 lb (134.7 kg)  11/25/18 299 lb (135.6 kg)  11/17/18 (!) 303 lb (137.4 kg)    Physical Exam  Results for orders placed or performed in visit on 11/25/18  BCR-ABL1 FISH  Result Value Ref Range   Specimen Type BLOOD    Cells Counted 200    Cells Analyzed 200    FISH Result Comment:    Interpretation Comment:    Director Review: Comment:   Rheumatoid factor  Result Value Ref Range   Rhuematoid fact SerPl-aCnc 11.1 0.0 - 13.9 IU/mL  Sedimentation rate  Result Value Ref Range   Sed Rate 12 0 - 22 mm/hr  ANA, IFA (with reflex)  Result Value Ref Range   ANA Ab, IFA Negative   JAK2 V617F, w Reflex to  CALR/E12/MPL  Result Value Ref Range   JAK2 GenotypR Comment    BACKGROUND: Comment    Director Review, JAK2 Comment    REFLEX: Comment  Extraction Completed   C-reactive protein  Result Value Ref Range   CRP 2.4 (H) <1.0 mg/dL  Save Smear [facility])  Result Value Ref Range   Smear Review SMEAR STAINED AND AVAILABLE FOR REVIEW   Lactate dehydrogenase  Result Value Ref Range   LDH 142 98 - 192 U/L  CBC with Differential  Result Value Ref Range   WBC 18.7 (H) 4.0 - 10.5 K/uL   RBC 5.77 (H) 3.87 - 5.11 MIL/uL   Hemoglobin 14.3 12.0 - 15.0 g/dL   HCT 43.9 36.0 - 46.0 %   MCV 76.1 (L) 80.0 - 100.0 fL   MCH 24.8 (L) 26.0 - 34.0 pg   MCHC 32.6 30.0 - 36.0 g/dL   RDW 17.7 (H) 11.5 - 15.5 %   Platelets 343 150 - 400 K/uL   nRBC 0.0 0.0 - 0.2 %   Neutrophils Relative % 71 %   Lymphocytes Relative 23 %   Monocytes Relative 6 %   Eosinophils Relative 0 %   Basophils Relative 0 %   Band Neutrophils 0 %   Metamyelocytes Relative 0 %   Myelocytes 0 %   Promyelocytes Relative 0 %   Blasts 0 %   nRBC 0 0 /100 WBC   Other 0 %   Neutro Abs 13.3 (H) 1.7 - 7.7 K/uL   Lymphs Abs 4.3 (H) 0.7 - 4.0 K/uL   Monocytes Absolute 1.1 (H) 0.1 - 1.0 K/uL   Eosinophils Absolute 0.0 0.0 - 0.5 K/uL   Basophils Absolute 0.0 0.0 - 0.1 K/uL  CALR + JAK2 E12-15 + MPL (reflexed)  Result Value Ref Range   CALR Mutation Detection Result Comment    Background: Comment    Methodology: Comment    References: Comment    Director Review Comment    JAK2 Exons 12-15 Mut Det PCR: Comment    BACKGROUND: Comment    Method Comment    References Comment    DIRECTOR REVIEW: Comment    MPL MUTATION ANALYSIS RESULT: Comment    BACKGROUND: Comment    METHODOLOGY: Comment    REFERENCES: Comment    DIRECTOR REVIEW: Comment    Extraction Comment        Pertinent labs & imaging results that were available during my care of the patient were reviewed by me and considered in my medical decision making.  Assessment  & Plan:  [place] was seen today for medical management of chronic issues.  Diagnoses and all orders for this visit:  Diabetes mellitus type 2 in obese ([place]) A1C 7.8 in office today. Pt has made drastic lifestyle changes including diet and exercise. Will recheck in 3 months to see if medication adjustments are warranted. Continue current medications as prescribed. Report any persistent high or low readings.  -     Hemoglobin A1c -     metFORMIN (GLUCOPHAGE XR) 500 MG 24 hr tablet; Take 2 tablets (1,000 mg total) by mouth 2 (two) times daily. -     empagliflozin (JARDIANCE) 10 MG TABS tablet; Take 10 mg by mouth daily. -     CMP14+EGFR -     Microalbumin / creatinine urine ratio  Morbid obesity (HCC) Diet and exercise encouraged. Pt has lost several pounds. Encouraged to keep up the good work.  -     CMP14+EGFR -     Thyroid Panel With TSH  Hyperlipidemia associated with type 2 diabetes mellitus ([place]) Diet and exercise encouraged. Continue medications as prescribed.  -  Lipid panel  Simple chronic bronchitis ([place]) Doing well with current regimen. Continue below. Report any new or worsening symptoms.  -     albuterol (PROVENTIL) (2.5 MG/3ML) 0.083% nebulizer solution; Take 3 mLs (2.5 mg total) by nebulization every 6 (six) hours as needed for wheezing or shortness of breath. -     albuterol (VENTOLIN HFA) 108 (90 Base) MCG/ACT inhaler; Inhale 2 puffs into the lungs every 6 (six) hours as needed for wheezing or shortness of breath. -     budesonide-formoterol (SYMBICORT) 80-4.5 MCG/ACT inhaler; Inhale 2 puffs into the lungs 2 (two) times daily. -     montelukast (SINGULAIR) 10 MG tablet; Take 1 tablet (10 mg total) by mouth at bedtime.  Hypertension 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 ([place]) Well controlled with below. DASH diet and exercise encouraged. Report any persistent high or low readings.  -     lisinopril-hydrochlorothiazide (ZESTORETIC)  10-12.5 MG tablet; Take 1 tablet by mouth daily. 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 Pt would like referral to new GI, referral placed. Report any new or worsening symptoms. Report any hematochezia or melena.  -     Ambulatory referral to Gastroenterology     Continue all other maintenance medications.  Follow up plan: Return in about 3 months (around 06/25/2019), or if symptoms worsen or fail to improve, for DM.  Educational handout given for survey  The above assessment and management plan was discussed with the patient. The patient verbalized understanding of and has agreed to the management plan. Patient is aware to call the clinic if symptoms persist or worsen. Patient is aware when to return to the clinic for a follow-up visit. Patient educated on when it is appropriate to go to the emergency department.   Monia Pouch, FNP-C [facility] [phone]

## 2019-03-26 LAB — MICROALBUMIN / CREATININE URINE RATIO
Creatinine, Urine: 32.6 mg/dL
Microalb/Creat Ratio: <9 mg/g creat (ref 0–29)
Microalbumin, Urine: <3 ug/mL

## 2019-03-26 LAB — CMP14+EGFR
ALT: 26 IU/L (ref 0–32)
AST: 20 IU/L (ref 0–40)
Albumin/Globulin Ratio: 1.8 (ref 1.2–2.2)
Albumin: 4.6 g/dL (ref 3.8–4.8)
Alkaline Phosphatase: 102 IU/L (ref 39–117)
BUN/Creatinine Ratio: 9 (ref 9–23)
BUN: 6 mg/dL (ref 6–20)
Bilirubin Total: 0.3 mg/dL (ref 0.0–1.2)
CO2: 17 mmol/L — ABNORMAL LOW (ref 20–29)
Calcium: 9.3 mg/dL (ref 8.7–10.2)
Chloride: 99 mmol/L (ref 96–106)
Creatinine, Ser: 0.67 mg/dL (ref 0.57–1.00)
GFR calc Af Amer: 134 mL/min/{1.73_m2} (ref >59)
GFR calc non Af Amer: 116 mL/min/{1.73_m2} (ref >59)
Globulin, Total: 2.6 g/dL (ref 1.5–4.5)
Glucose: 201 mg/dL — ABNORMAL HIGH (ref 65–99)
Potassium: 4.5 mmol/L (ref 3.5–5.2)
Sodium: 137 mmol/L (ref 134–144)
Total Protein: 7.2 g/dL (ref 6.0–8.5)

## 2019-03-26 LAB — THYROID PANEL WITH TSH
Free Thyroxine Index: 2 (ref 1.2–4.9)
T3 Uptake Ratio: 22 % — ABNORMAL LOW (ref 24–39)
T4, Total: 9.2 ug/dL (ref 4.5–12.0)
TSH: 1.78 u[IU]/mL (ref 0.450–4.500)

## 2019-03-26 LAB — CBC WITH DIFFERENTIAL/PLATELET
Basophils Absolute: 0.1 10*3/uL (ref 0.0–0.2)
Basos: 0 %
EOS (ABSOLUTE): 0.2 10*3/uL (ref 0.0–0.4)
Eos: 1 %
Hematocrit: 44.3 % (ref 34.0–46.6)
Hemoglobin: 14.1 g/dL (ref 11.1–15.9)
Immature Grans (Abs): 0 10*3/uL (ref 0.0–0.1)
Immature Granulocytes: 0 %
Lymphocytes Absolute: 3.2 10*3/uL — ABNORMAL HIGH (ref 0.7–3.1)
Lymphs: 20 %
MCH: 24.7 pg — ABNORMAL LOW (ref 26.6–33.0)
MCHC: 31.8 g/dL (ref 31.5–35.7)
MCV: 77 fL — ABNORMAL LOW (ref 79–97)
Monocytes Absolute: 0.9 10*3/uL (ref 0.1–0.9)
Monocytes: 5 %
Neutrophils Absolute: 11.9 10*3/uL — ABNORMAL HIGH (ref 1.4–7.0)
Neutrophils: 74 %
Platelets: 338 10*3/uL (ref 150–450)
RBC: 5.72 x10E6/uL — ABNORMAL HIGH (ref 3.77–5.28)
RDW: 16.5 % — ABNORMAL HIGH (ref 11.7–15.4)
WBC: 16.2 10*3/uL — ABNORMAL HIGH (ref 3.4–10.8)

## 2019-03-26 LAB — LIPID PANEL
Chol/HDL Ratio: 4.5 ratio — ABNORMAL HIGH (ref 0.0–4.4)
Cholesterol, Total: 188 mg/dL (ref 100–199)
HDL: 42 mg/dL (ref >39)
LDL Calculated: 105 mg/dL — ABNORMAL HIGH (ref 0–99)
Triglycerides: 205 mg/dL — ABNORMAL HIGH (ref 0–149)
VLDL Cholesterol Cal: 41 mg/dL — ABNORMAL HIGH (ref 5–40)

## 2019-03-30 ENCOUNTER — Telehealth: Payer: Self-pay | Admitting: Family Medicine

## 2019-03-30 NOTE — Telephone Encounter (Signed)
Patient states she talked to [place] about her disability paper work and would like for her to call her back about her paper work.

## 2019-03-30 NOTE — Telephone Encounter (Signed)
Closing encounter Pt talked w/ Barnett Applebaum, we do not do this type of form, pt will be picking it up

## 2019-03-31 ENCOUNTER — Ambulatory Visit (HOSPITAL_COMMUNITY): Payer: Medicaid Other | Admitting: Psychiatry

## 2019-03-31 ENCOUNTER — Telehealth: Payer: Self-pay | Admitting: Family Medicine

## 2019-03-31 ENCOUNTER — Encounter: Payer: Self-pay | Admitting: Family Medicine

## 2019-04-05 NOTE — Telephone Encounter (Signed)
LMOVM ppw is disability determinition papers that we do not do in this office, these will need to be taken to the social security administration office
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
## 2019-04-14 DIAGNOSIS — M9902 Segmental and somatic dysfunction of thoracic region: Secondary | ICD-10-CM | POA: Diagnosis not present

## 2019-04-14 DIAGNOSIS — M9905 Segmental and somatic dysfunction of pelvic region: Secondary | ICD-10-CM | POA: Diagnosis not present

## 2019-04-14 DIAGNOSIS — M9903 Segmental and somatic dysfunction of lumbar region: Secondary | ICD-10-CM | POA: Diagnosis not present

## 2019-04-14 DIAGNOSIS — M5441 Lumbago with sciatica, right side: Secondary | ICD-10-CM | POA: Diagnosis not present

## 2019-04-15 ENCOUNTER — Telehealth (HOSPITAL_COMMUNITY): Payer: Self-pay | Admitting: Psychiatry

## 2019-04-15 ENCOUNTER — Other Ambulatory Visit: Payer: Self-pay

## 2019-04-15 ENCOUNTER — Ambulatory Visit (HOSPITAL_COMMUNITY): Payer: Medicaid Other | Admitting: Psychiatry

## 2019-04-15 NOTE — Telephone Encounter (Signed)
Sent link for video visit through [facility]. Patient did not sign in. Called the patient  twice for appointment scheduled today. The patient did not answer the phone. Left voice message to contact the office.  

## 2019-04-16 ENCOUNTER — Ambulatory Visit: Payer: Medicaid Other | Admitting: Family Medicine

## 2019-04-16 ENCOUNTER — Other Ambulatory Visit: Payer: Self-pay | Admitting: Family Medicine

## 2019-04-16 NOTE — Telephone Encounter (Signed)
She will need to be seen

## 2019-04-16 NOTE — Telephone Encounter (Signed)
Patient refused appointment stating she can not get out of the bed and then hung up phone

## 2019-04-16 NOTE — Telephone Encounter (Signed)
Patient states that she stopped taking tramadol around 03/23/2019 and would like to be put back on skelaxin

## 2019-04-19 DIAGNOSIS — M9902 Segmental and somatic dysfunction of thoracic region: Secondary | ICD-10-CM | POA: Diagnosis not present

## 2019-04-19 DIAGNOSIS — M9905 Segmental and somatic dysfunction of pelvic region: Secondary | ICD-10-CM | POA: Diagnosis not present

## 2019-04-19 DIAGNOSIS — M5441 Lumbago with sciatica, right side: Secondary | ICD-10-CM | POA: Diagnosis not present

## 2019-04-19 DIAGNOSIS — M9903 Segmental and somatic dysfunction of lumbar region: Secondary | ICD-10-CM | POA: Diagnosis not present

## 2019-04-21 DIAGNOSIS — M9905 Segmental and somatic dysfunction of pelvic region: Secondary | ICD-10-CM | POA: Diagnosis not present

## 2019-04-21 DIAGNOSIS — M9902 Segmental and somatic dysfunction of thoracic region: Secondary | ICD-10-CM | POA: Diagnosis not present

## 2019-04-21 DIAGNOSIS — M5441 Lumbago with sciatica, right side: Secondary | ICD-10-CM | POA: Diagnosis not present

## 2019-04-21 DIAGNOSIS — M9903 Segmental and somatic dysfunction of lumbar region: Secondary | ICD-10-CM | POA: Diagnosis not present

## 2019-04-23 DIAGNOSIS — M9902 Segmental and somatic dysfunction of thoracic region: Secondary | ICD-10-CM | POA: Diagnosis not present

## 2019-04-23 DIAGNOSIS — M9905 Segmental and somatic dysfunction of pelvic region: Secondary | ICD-10-CM | POA: Diagnosis not present

## 2019-04-23 DIAGNOSIS — M5441 Lumbago with sciatica, right side: Secondary | ICD-10-CM | POA: Diagnosis not present

## 2019-04-23 DIAGNOSIS — M9903 Segmental and somatic dysfunction of lumbar region: Secondary | ICD-10-CM | POA: Diagnosis not present

## 2019-04-26 ENCOUNTER — Other Ambulatory Visit (HOSPITAL_COMMUNITY): Payer: Self-pay | Admitting: Physical Medicine and Rehabilitation

## 2019-04-26 ENCOUNTER — Other Ambulatory Visit: Payer: Self-pay | Admitting: Physical Medicine and Rehabilitation

## 2019-04-26 DIAGNOSIS — M545 Low back pain, unspecified: Secondary | ICD-10-CM

## 2019-04-26 DIAGNOSIS — M9905 Segmental and somatic dysfunction of pelvic region: Secondary | ICD-10-CM | POA: Diagnosis not present

## 2019-04-26 DIAGNOSIS — M9902 Segmental and somatic dysfunction of thoracic region: Secondary | ICD-10-CM | POA: Diagnosis not present

## 2019-04-26 DIAGNOSIS — M5441 Lumbago with sciatica, right side: Secondary | ICD-10-CM | POA: Diagnosis not present

## 2019-04-26 DIAGNOSIS — M9903 Segmental and somatic dysfunction of lumbar region: Secondary | ICD-10-CM | POA: Diagnosis not present

## 2019-04-28 ENCOUNTER — Other Ambulatory Visit: Payer: Self-pay

## 2019-04-28 ENCOUNTER — Ambulatory Visit (HOSPITAL_COMMUNITY)
Admission: RE | Admit: 2019-04-28 | Discharge: 2019-04-28 | Disposition: A | Discharge status: Home / Self Care | Payer: Medicaid Other | Source: Ambulatory Visit | Attending: Physical Medicine and Rehabilitation | Admitting: Physical Medicine and Rehabilitation

## 2019-04-28 DIAGNOSIS — M545 Low back pain, unspecified: Secondary | ICD-10-CM

## 2019-04-29 ENCOUNTER — Other Ambulatory Visit: Payer: Self-pay | Admitting: Neurological Surgery

## 2019-04-29 DIAGNOSIS — M5126 Other intervertebral disc displacement, lumbar region: Secondary | ICD-10-CM | POA: Diagnosis not present

## 2019-05-17 NOTE — Progress Notes (Signed)
[id number] - [place], [address] [place] [address] Phone: [phone] Fax: [phone]      Your procedure is scheduled on May 21, 2019.  Report to [facility] Main Entrance "A" at 05:30 A.M., and check in at the Admitting office.  Call this number if you have problems the morning of surgery:  [phone]  Call [phone] if you have any questions prior to your surgery date Monday-Friday 8am-4pm    Remember:  Do not eat or drink after midnight the night before your surgery    Take these medicines the morning of surgery with A SIP OF WATER :  albuterol (VENTOLIN HFA)  Inhaler if needed--bring with you the day of surgery budesonide-formoterol (SYMBICORT) inhaler cetirizine (ZYRTEC) divalproex (DEPAKOTE) DULoxetine (CYMBALTA) gabapentin (NEURONTIN) methocarbamol (ROBAXIN) if needed traMADol (ULTRAM) if needed   7 days prior to surgery STOP taking any Aspirin (unless otherwise instructed by your surgeon), Aleve, Naproxen, Ibuprofen, Motrin, Advil, Goody's, BC's, all herbal medications, fish oil, and all vitamins.   WHAT DO I DO ABOUT MY DIABETES MEDICATION?  DO NOT TAKE Empagliflozin (Jardiance) the [place], 05/20/2019  Do NOT take oral diabetes medicines (pills) the morning of surgery.  DO NOT TAKE Empagliflozin (Jardiance) or Metformin (Glucophage XR) the morning of your surgery.   How to Manage Your Diabetes Before and After Surgery  Why is it important to control my blood sugar before and after surgery? . Improving blood sugar levels before and after surgery helps healing and can limit problems. . A way of improving blood sugar control is eating a healthy diet by: o  Eating less sugar and carbohydrates o  Increasing activity/exercise o  Talking with your doctor about reaching your blood sugar goals . High blood sugars (greater than 180 mg/dL) can raise  your risk of infections and slow your recovery, so you will need to focus on controlling your diabetes during the weeks before surgery. . Make sure that the doctor who takes care of your diabetes knows about your planned surgery including the date and location.  How do I manage my blood sugar before surgery? . Check your blood sugar at least 4 times a day, starting 2 days before surgery, to make sure that the level is not too high or low. o Check your blood sugar the morning of your surgery when you wake up and every 2 hours until you get to the Short Stay unit. . If your blood sugar is less than 70 mg/dL, you will need to treat for low blood sugar: o Do not take insulin. o Treat a low blood sugar (less than 70 mg/dL) with  cup of clear juice (cranberry or Plass), 4 glucose tablets, OR glucose gel. o Recheck blood sugar in 15 minutes after treatment (to make sure it is greater than 70 mg/dL). If your blood sugar is not greater than 70 mg/dL on recheck, call [phone] for further instructions. . Report your blood sugar to the short stay nurse when you get to Short Stay.  . If you are admitted to the hospital after surgery: o Your blood sugar will be checked by the staff and you will probably be given insulin after surgery (instead of oral diabetes medicines) to make sure you have good blood sugar levels. o The goal for blood sugar control after surgery is 80-180 mg/dL.   The Morning of Surgery  Do not wear jewelry, make-up or nail polish.  Do not wear lotions, powders, or perfumes/colognes, or deodorant  Do not shave 48 hours prior to surgery.   Do not bring valuables to the hospital.  [facility] is not responsible for any belongings or valuables.  If you are a smoker, DO NOT Smoke 24 hours prior to surgery IF you wear a CPAP at night please bring your mask, tubing, and machine the morning of surgery   Remember that you must have someone to transport you home after your surgery, and  remain with you for 24 hours if you are discharged the same day.   Contacts, glasses, hearing aids, dentures or bridgework may not be worn into surgery.    Leave your suitcase in the car.  After surgery it may be brought to your room.  For patients admitted to the hospital, discharge time will be determined by your treatment team.  Patients discharged the day of surgery will not be allowed to drive home.    Special instructions:   [place]- Preparing For Surgery  Before surgery, you can play an important role. Because skin is not sterile, your skin needs to be as free of germs as possible. You can reduce the number of germs on your skin by washing with CHG (chlorahexidine gluconate) Soap before surgery.  CHG is an antiseptic cleaner which kills germs and bonds with the skin to continue killing germs even after washing.    Oral Hygiene is also important to reduce your risk of infection.  Remember - BRUSH YOUR TEETH THE MORNING OF SURGERY WITH YOUR REGULAR TOOTHPASTE  Please do not use if you have an allergy to CHG or antibacterial soaps. If your skin becomes reddened/irritated stop using the CHG.  Do not shave (including legs and underarms) for at least 48 hours prior to first CHG shower. It is OK to shave your face.  Please follow these instructions carefully.   1. Shower the NIGHT BEFORE SURGERY and the MORNING OF SURGERY with CHG Soap.   2. If you chose to wash your hair, wash your hair first as usual with your normal shampoo.  3. After you shampoo, rinse your hair and body thoroughly to remove the shampoo.  4. Use CHG as you would any other liquid soap. You can apply CHG directly to the skin and wash gently with a scrungie or a clean washcloth.   5. Apply the CHG Soap to your body ONLY FROM THE NECK DOWN.  Do not use on open wounds or open sores. Avoid contact with your eyes, ears, mouth and genitals (private parts). Wash Face and genitals (private parts)  with your normal  soap.   6. Wash thoroughly, paying special attention to the area where your surgery will be performed.  7. Thoroughly rinse your body with warm water from the neck down.  8. DO NOT shower/wash with your normal soap after using and rinsing off the CHG Soap.  9. Pat yourself dry with a CLEAN TOWEL.  10. Wear CLEAN PAJAMAS to bed the night before surgery, wear comfortable clothes the morning of surgery  11. Place CLEAN SHEETS on your bed the night of your first shower and DO NOT SLEEP WITH PETS.    Day of Surgery:  Do not apply any deodorants/lotions. Please shower the morning of surgery with the CHG soap  Please wear clean clothes to the hospital/surgery center.   Remember to brush your teeth WITH YOUR REGULAR TOOTHPASTE.   Please  read over the following fact sheets that you were given.

## 2019-05-18 ENCOUNTER — Encounter (HOSPITAL_COMMUNITY)
Admission: RE | Admit: 2019-05-18 | Discharge: 2019-05-18 | Disposition: A | Discharge status: Home / Self Care | Payer: Medicaid Other | Source: Ambulatory Visit | Attending: Neurological Surgery | Admitting: Neurological Surgery

## 2019-05-18 ENCOUNTER — Other Ambulatory Visit (HOSPITAL_COMMUNITY)
Admission: RE | Admit: 2019-05-18 | Discharge: 2019-05-18 | Disposition: A | Discharge status: Home / Self Care | Payer: Medicaid Other | Source: Ambulatory Visit | Attending: Neurological Surgery | Admitting: Neurological Surgery

## 2019-05-18 ENCOUNTER — Other Ambulatory Visit: Payer: Self-pay

## 2019-05-18 ENCOUNTER — Encounter (HOSPITAL_COMMUNITY): Payer: Self-pay

## 2019-05-18 ENCOUNTER — Telehealth: Payer: Self-pay | Admitting: Family Medicine

## 2019-05-18 DIAGNOSIS — E1169 Type 2 diabetes mellitus with other specified complication: Secondary | ICD-10-CM | POA: Diagnosis not present

## 2019-05-18 DIAGNOSIS — M5126 Other intervertebral disc displacement, lumbar region: Secondary | ICD-10-CM | POA: Insufficient documentation

## 2019-05-18 DIAGNOSIS — E1122 Type 2 diabetes mellitus with diabetic chronic kidney disease: Secondary | ICD-10-CM | POA: Insufficient documentation

## 2019-05-18 DIAGNOSIS — F17210 Nicotine dependence, cigarettes, uncomplicated: Secondary | ICD-10-CM | POA: Insufficient documentation

## 2019-05-18 DIAGNOSIS — N189 Chronic kidney disease, unspecified: Secondary | ICD-10-CM | POA: Diagnosis not present

## 2019-05-18 DIAGNOSIS — Z6841 Body Mass Index (BMI) 40.0 and over, adult: Secondary | ICD-10-CM | POA: Insufficient documentation

## 2019-05-18 DIAGNOSIS — Z20828 Contact with and (suspected) exposure to other viral communicable diseases: Secondary | ICD-10-CM | POA: Insufficient documentation

## 2019-05-18 DIAGNOSIS — I129 Hypertensive chronic kidney disease with stage 1 through stage 4 chronic kidney disease, or unspecified chronic kidney disease: Secondary | ICD-10-CM | POA: Insufficient documentation

## 2019-05-18 DIAGNOSIS — Z01818 Encounter for other preprocedural examination: Secondary | ICD-10-CM | POA: Diagnosis not present

## 2019-05-18 LAB — CBC WITH DIFFERENTIAL/PLATELET
Abs Immature Granulocytes: 0.08 10*3/uL — ABNORMAL HIGH (ref 0.00–0.07)
Basophils Absolute: 0.1 10*3/uL (ref 0.0–0.1)
Basophils Relative: 0 %
Eosinophils Absolute: 0.2 10*3/uL (ref 0.0–0.5)
Eosinophils Relative: 1 %
HCT: 38.4 % (ref 36.0–46.0)
Hemoglobin: 12.3 g/dL (ref 12.0–15.0)
Immature Granulocytes: 1 %
Lymphocytes Relative: 24 %
Lymphs Abs: 3.4 10*3/uL (ref 0.7–4.0)
MCH: 24.6 pg — ABNORMAL LOW (ref 26.0–34.0)
MCHC: 32 g/dL (ref 30.0–36.0)
MCV: 76.8 fL — ABNORMAL LOW (ref 80.0–100.0)
Monocytes Absolute: 0.9 10*3/uL (ref 0.1–1.0)
Monocytes Relative: 6 %
Neutro Abs: 9.9 10*3/uL — ABNORMAL HIGH (ref 1.7–7.7)
Neutrophils Relative %: 68 %
Platelets: 322 10*3/uL (ref 150–400)
RBC: 5 MIL/uL (ref 3.87–5.11)
RDW: 15.1 % (ref 11.5–15.5)
WBC: 14.5 10*3/uL — ABNORMAL HIGH (ref 4.0–10.5)
nRBC: 0 % (ref 0.0–0.2)

## 2019-05-18 LAB — BASIC METABOLIC PANEL
Anion gap: 12 (ref 5–15)
BUN: 6 mg/dL (ref 6–20)
CO2: 21 mmol/L — ABNORMAL LOW (ref 22–32)
Calcium: 8.7 mg/dL — ABNORMAL LOW (ref 8.9–10.3)
Chloride: 103 mmol/L (ref 98–111)
Creatinine, Ser: 0.5 mg/dL (ref 0.44–1.00)
GFR calc Af Amer: >60 mL/min (ref >60)
GFR calc non Af Amer: >60 mL/min (ref >60)
Glucose, Bld: 197 mg/dL — ABNORMAL HIGH (ref 70–99)
Potassium: 4.1 mmol/L (ref 3.5–5.1)
Sodium: 136 mmol/L (ref 135–145)

## 2019-05-18 LAB — PROTIME-INR
INR: 0.9 (ref 0.8–1.2)
Prothrombin Time: 11.9 seconds (ref 11.4–15.2)

## 2019-05-18 LAB — SURGICAL PCR SCREEN
MRSA, PCR: NEGATIVE
Staphylococcus aureus: NEGATIVE

## 2019-05-18 LAB — GLUCOSE, CAPILLARY: Glucose-Capillary: 239 mg/dL — ABNORMAL HIGH (ref 70–99)

## 2019-05-18 LAB — HEMOGLOBIN A1C
Hgb A1c MFr Bld: 8.2 % — ABNORMAL HIGH (ref 4.8–5.6)
Mean Plasma Glucose: 188.64 mg/dL

## 2019-05-18 LAB — SARS CORONAVIRUS 2 (TAT 6-24 HRS): SARS Coronavirus 2: NEGATIVE

## 2019-05-18 NOTE — Progress Notes (Addendum)
PCP - DR Thayer Ohm   [place]    PT STATES PCP IS AWARE OF SURGERY Cardiologist - NONE  Chest x-ray - 1/20 EKG - 9/20 Stress Test - NA ECHO -NA  Cardiac Cath - NA      Fasting Blood Sugar - 239 Checks Blood Sugar ___PRN__ times a day    Anesthesia review: HTN.   PT STATED SHE THOUGHT SHE WAS TO STOP ALL MEDS.THAT WHY BP WAS ELEVATED.I INFORMED HER OF CORRECT INSTRUCTIONS.EXPRESSED UNDERSTANDING  Patient denies shortness of breath, fever, cough and chest pain at PAT appointment   Patient verbalized understanding of instructions that were given to them at the PAT appointment. Patient was also instructed that they will need to review over the PAT instructions again at home before surgery.

## 2019-05-19 NOTE — Progress Notes (Addendum)
Anesthesia Chart Review:  Case: [id number] Date/Time: 05/21/19 0715   Procedure: Microdiscectomy - L4-L5 - right (Right Back)   Anesthesia type: General   Pre-op diagnosis: HNP   Location: [facility] OR ROOM 20 / [facility]   Surgeon: Eustace Moore, MD      DISCUSSION: Patient is a 33 year old female scheduled for the above procedure.  History includes smoking, DM2, HTN, HLD, CKD (stage II), asthma, chronic headaches, mood disorder, endometriosis. She was seen by hematology earlier this year for leukocytosis, thought to likely be smoking related. JAK2 mutation negative, consider bone marrow biopsy, but patient deferred at 12/2018 visit. BMI is consistent with morbid obesity.    PAT BP 17/103; however, reported that she thought she was to hold all medications (not just NSAIDS, etc), so had not taken prazosin or Zestoretic. Her PAT RN re-educated about meds she could take before and on the day of surgery.   WBC 14.5, consistent with previous trends. Per PAT RN note, patient denied cough, fever, SOB, chest pain.  A1c 8.2.  I notified Lorriane Shire at Dr. Ronnald Ramp' office regarding BP (without meds), WBC 14.5 and 12/22/18 hematology visit, A1c of 8.2. She will notify Dr. Ronnald Ramp, so he can review labs and last hematology office note. If no acute changes, then defer additional recommendations per surgeon. She will get a BP check on arrival the day of surgery.   05/18/19 COVID-19 test negative. If BP acceptable or significant changes, then I would anticipate that she can proceed as planned. Needs urine pregnancy test on arrival.    VS: BP (!) 171/103   Pulse 95   Temp 36.7 C (Oral)   Resp 18   Ht '5\' 5"'$  (1.651 m)   Wt (!) 137.8 kg   LMP 05/10/2019   SpO2 96%   BMI 50.55 kg/m    PROVIDERS: Baruch Gouty, FNP is PCP ([place]) - Derek Jack, MD is hematologist. First seen on 11/25/18 for leukocytosis. Last visit 12/22/18. Per note, "Myeloproliferative disorder work-up for BCR/ABL and Jak  2 mutation testing was negative.  LDH was normal. 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 3 month follow-up recommended.     LABS: Preoperative labs noted. WBC 14.5. A1c 8.2  (all labs ordered are listed, but only abnormal results are displayed)  Labs Reviewed  GLUCOSE, CAPILLARY - Abnormal; Notable for the following components:      Result Value   Glucose-Capillary 239 (*)    All other components within normal limits  HEMOGLOBIN A1C - Abnormal; Notable for the following components:   Hgb A1c MFr Bld 8.2 (*)    All other components within normal limits  BASIC METABOLIC PANEL - Abnormal; Notable for the following components:   CO2 21 (*)    Glucose, Bld 197 (*)    Calcium 8.7 (*)    All other components within normal limits  CBC WITH DIFFERENTIAL/PLATELET - Abnormal; Notable for the following components:   WBC 14.5 (*)    MCV 76.8 (*)    MCH 24.6 (*)    Neutro Abs 9.9 (*)    Abs Immature Granulocytes 0.08 (*)    All other components within normal limits  SURGICAL PCR SCREEN  PROTIME-INR     IMAGES: MRI L-spine 04/28/19: IMPRESSION: 1. Significant enlargement of right paracentral disc protrusion at L4-5 compared with previous MRI from 2016. This displaces the right L5 nerve root posteriorly  and is likely symptomatic. 2. Stable shallow disc protrusions on the right at L2-3 and L3-4 without nerve root encroachment. 3. No other significant findings.  CXR 10/08/18: FINDINGS: Lungs are clear. Heart size and pulmonary vascularity are normal. No adenopathy. There is degenerative change in the lower thoracic region. IMPRESSION: No edema or consolidation.   EKG: 05/18/19: NSR   CV: Denied.  Past Medical History:  Diagnosis Date  . Anxiety   . Arthritis    left knee  . Asthma   . Chronic abdominal pain   . Chronic headaches   . Depression   . Diabetes mellitus type 2 in  obese ([place]) 11/20/2016  . Endometriosis   . HLD (hyperlipidemia) 02/17/2017  . Hypertension   . 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 Mood disorder ([place])   . Obesity   . Ovarian cyst   . Tobacco abuse     Past Surgical History:  Procedure Laterality Date  . CESAREAN SECTION  2008   [place]  . CHOLECYSTECTOMY N/A 11/25/2014   Procedure: LAPAROSCOPIC CHOLECYSTECTOMY;  Surgeon: Aviva Signs Md, MD;  Location: [facility];  Service: General;  Laterality: N/A;  . ESOPHAGOGASTRODUODENOSCOPY N/A 12/21/2015   Dr. Gala Romney: normal esophagus, non-bleeding erosive gastropathy, normal second portion of duodenum. Reactive gastropathy/chemical gastritis, negative H.pylori  . FEMUR FRACTURE SURGERY Left 1995   tree fell on her  . HERNIA REPAIR    . INCISIONAL HERNIA REPAIR N/A 01/29/2016   Procedure: Fatima Blank HERNIORRHAPHY WITH MESH;  Surgeon: Aviva Signs, MD;  Location: [facility];  Service: General;  Laterality: N/A;  . INSERTION OF MESH  01/29/2016   Procedure: INSERTION OF MESH;  Surgeon: Aviva Signs, MD;  Location: [facility];  Service: General;;  . KNEE ARTHROSCOPY Left   . TUBAL LIGATION      MEDICATIONS: . traMADol (ULTRAM) 50 MG tablet  . albuterol (PROVENTIL) (2.5 MG/3ML) 0.083% nebulizer solution  . albuterol (VENTOLIN HFA) 108 (90 Base) MCG/ACT inhaler  . budesonide-formoterol (SYMBICORT) 80-4.5 MCG/ACT inhaler  . cetirizine (ZYRTEC) 10 MG tablet  . divalproex (DEPAKOTE) 250 MG DR tablet  . DULoxetine (CYMBALTA) 30 MG capsule  . DULoxetine (CYMBALTA) 60 MG capsule  . empagliflozin (JARDIANCE) 10 MG TABS tablet  . gabapentin (NEURONTIN) 300 MG capsule  . lisinopril-hydrochlorothiazide (ZESTORETIC) 10-12.5 MG tablet  . LORazepam (ATIVAN) 0.5 MG tablet  . lovastatin (MEVACOR) 20 MG tablet  . metFORMIN (GLUCOPHAGE XR) 500 MG 24 hr tablet  . methocarbamol (ROBAXIN) 500 MG tablet  . montelukast (SINGULAIR) 10 MG tablet  . pravastatin  (PRAVACHOL) 40 MG tablet  . prazosin (MINIPRESS) 1 MG capsule   No current facility-administered medications for this encounter.     Myra Gianotti, PA-C Surgical Short Stay/Anesthesiology [facility] Phone [phone] [facility] Phone [phone] 05/19/2019 3:43 PM

## 2019-05-19 NOTE — Anesthesia Preprocedure Evaluation (Addendum)
Anesthesia Evaluation  Patient identified by MRN, date of birth, ID band Patient awake    Reviewed: Allergy & Precautions, NPO status , Patient's Chart, lab work & pertinent test results  Airway Mallampati: III  TM Distance: >3 FB Neck ROM: Full    Dental no notable dental hx.    Pulmonary asthma , Current Smoker and Patient abstained from smoking.,    Pulmonary exam normal breath sounds clear to auscultation       Cardiovascular hypertension, Pt. on medications Normal cardiovascular exam Rhythm:Regular Rate:Normal  ECG: NSR, rate 90   Neuro/Psych  Headaches, PSYCHIATRIC DISORDERS Anxiety Depression PTSD (post-traumatic stress disorder)   GI/Hepatic negative GI ROS, Neg liver ROS,   Endo/Other  diabetes, Oral Hypoglycemic AgentsMorbid obesity (Super)  Renal/GU negative Renal ROS     Musculoskeletal negative musculoskeletal ROS (+)   Abdominal (+) + obese,   Peds  Hematology HLD   Anesthesia Other Findings HNP  Reproductive/Obstetrics S/p BTL                           Anesthesia Physical Anesthesia Plan  ASA: IV  Anesthesia Plan: General   Post-op Pain Management:    Induction: Intravenous  PONV Risk Score and Plan: 2 and Ondansetron, Dexamethasone, Midazolam and Treatment may vary due to age or medical condition  Airway Management Planned: Oral ETT  Additional Equipment:   Intra-op Plan:   Post-operative Plan: Extubation in OR  Informed Consent: I have reviewed the patients History and Physical, chart, labs and discussed the procedure including the risks, benefits and alternatives for the proposed anesthesia with the patient or authorized representative who has indicated his/her understanding and acceptance.     Dental advisory given  Plan Discussed with: CRNA  Anesthesia Plan Comments: (Reviewed PAT note written 05/19/2019 by Myra Gianotti, PA-C. )      Anesthesia  Quick Evaluation

## 2019-05-20 ENCOUNTER — Encounter (HOSPITAL_COMMUNITY): Payer: Self-pay | Admitting: Certified Registered Nurse Anesthetist

## 2019-05-20 MED ORDER — DEXTROSE 5 % IV SOLN
3.0000 g | INTRAVENOUS | Status: AC
  Administered 2019-05-21: 08:00:00 3 g via INTRAVENOUS
  Filled 2019-05-20: qty 3

## 2019-05-21 ENCOUNTER — Ambulatory Visit (HOSPITAL_COMMUNITY): Payer: Medicaid Other

## 2019-05-21 ENCOUNTER — Encounter (HOSPITAL_COMMUNITY)
Admission: RE | Disposition: A | Discharge status: Home / Self Care | Payer: Self-pay | Source: Home / Self Care | Attending: Neurological Surgery

## 2019-05-21 ENCOUNTER — Encounter (HOSPITAL_COMMUNITY): Payer: Self-pay | Admitting: Neurological Surgery

## 2019-05-21 ENCOUNTER — Inpatient Hospital Stay (HOSPITAL_COMMUNITY)
Admission: RE | Admit: 2019-05-21 | Discharge: 2019-05-21 | DRG: 519 | Disposition: A | Discharge status: Home / Self Care | Payer: Medicaid Other | Attending: Neurological Surgery | Admitting: Neurological Surgery

## 2019-05-21 ENCOUNTER — Ambulatory Visit (HOSPITAL_COMMUNITY): Payer: Medicaid Other | Admitting: Vascular Surgery

## 2019-05-21 ENCOUNTER — Other Ambulatory Visit: Payer: Self-pay

## 2019-05-21 ENCOUNTER — Ambulatory Visit (HOSPITAL_COMMUNITY): Payer: Medicaid Other | Admitting: Certified Registered Nurse Anesthetist

## 2019-05-21 DIAGNOSIS — F4310 Post-traumatic stress disorder, unspecified: Secondary | ICD-10-CM | POA: Diagnosis present

## 2019-05-21 DIAGNOSIS — Z981 Arthrodesis status: Secondary | ICD-10-CM | POA: Diagnosis not present

## 2019-05-21 DIAGNOSIS — M5416 Radiculopathy, lumbar region: Secondary | ICD-10-CM | POA: Diagnosis present

## 2019-05-21 DIAGNOSIS — Z8349 Family history of other endocrine, nutritional and metabolic diseases: Secondary | ICD-10-CM

## 2019-05-21 DIAGNOSIS — Z7951 Long term (current) use of inhaled steroids: Secondary | ICD-10-CM | POA: Diagnosis not present

## 2019-05-21 DIAGNOSIS — Z8249 Family history of ischemic heart disease and other diseases of the circulatory system: Secondary | ICD-10-CM

## 2019-05-21 DIAGNOSIS — F419 Anxiety disorder, unspecified: Secondary | ICD-10-CM | POA: Diagnosis present

## 2019-05-21 DIAGNOSIS — Z8261 Family history of arthritis: Secondary | ICD-10-CM

## 2019-05-21 DIAGNOSIS — E119 Type 2 diabetes mellitus without complications: Secondary | ICD-10-CM | POA: Diagnosis not present

## 2019-05-21 DIAGNOSIS — Z01818 Encounter for other preprocedural examination: Secondary | ICD-10-CM | POA: Diagnosis not present

## 2019-05-21 DIAGNOSIS — Z7984 Long term (current) use of oral hypoglycemic drugs: Secondary | ICD-10-CM | POA: Diagnosis not present

## 2019-05-21 DIAGNOSIS — Z823 Family history of stroke: Secondary | ICD-10-CM | POA: Diagnosis not present

## 2019-05-21 DIAGNOSIS — F17210 Nicotine dependence, cigarettes, uncomplicated: Secondary | ICD-10-CM | POA: Diagnosis present

## 2019-05-21 DIAGNOSIS — Z79899 Other long term (current) drug therapy: Secondary | ICD-10-CM

## 2019-05-21 DIAGNOSIS — Z833 Family history of diabetes mellitus: Secondary | ICD-10-CM | POA: Diagnosis not present

## 2019-05-21 DIAGNOSIS — E785 Hyperlipidemia, unspecified: Secondary | ICD-10-CM | POA: Diagnosis not present

## 2019-05-21 DIAGNOSIS — Z885 Allergy status to narcotic agent status: Secondary | ICD-10-CM | POA: Diagnosis not present

## 2019-05-21 DIAGNOSIS — Z419 Encounter for procedure for purposes other than remedying health state, unspecified: Secondary | ICD-10-CM

## 2019-05-21 DIAGNOSIS — J45909 Unspecified asthma, uncomplicated: Secondary | ICD-10-CM | POA: Diagnosis present

## 2019-05-21 DIAGNOSIS — Z9889 Other specified postprocedural states: Secondary | ICD-10-CM

## 2019-05-21 DIAGNOSIS — M1712 Unilateral primary osteoarthritis, left knee: Secondary | ICD-10-CM | POA: Diagnosis present

## 2019-05-21 DIAGNOSIS — F329 Major depressive disorder, single episode, unspecified: Secondary | ICD-10-CM | POA: Diagnosis present

## 2019-05-21 DIAGNOSIS — Z6841 Body Mass Index (BMI) 40.0 and over, adult: Secondary | ICD-10-CM

## 2019-05-21 DIAGNOSIS — M5126 Other intervertebral disc displacement, lumbar region: Principal | ICD-10-CM | POA: Diagnosis present

## 2019-05-21 DIAGNOSIS — Z841 Family history of disorders of kidney and ureter: Secondary | ICD-10-CM

## 2019-05-21 DIAGNOSIS — E1122 Type 2 diabetes mellitus with diabetic chronic kidney disease: Secondary | ICD-10-CM | POA: Diagnosis not present

## 2019-05-21 DIAGNOSIS — F418 Other specified anxiety disorders: Secondary | ICD-10-CM | POA: Diagnosis not present

## 2019-05-21 DIAGNOSIS — N189 Chronic kidney disease, unspecified: Secondary | ICD-10-CM | POA: Diagnosis present

## 2019-05-21 DIAGNOSIS — I129 Hypertensive chronic kidney disease with stage 1 through stage 4 chronic kidney disease, or unspecified chronic kidney disease: Secondary | ICD-10-CM | POA: Diagnosis present

## 2019-05-21 DIAGNOSIS — Z9049 Acquired absence of other specified parts of digestive tract: Secondary | ICD-10-CM

## 2019-05-21 DIAGNOSIS — M5116 Intervertebral disc disorders with radiculopathy, lumbar region: Secondary | ICD-10-CM | POA: Diagnosis not present

## 2019-05-21 HISTORY — PX: LUMBAR LAMINECTOMY/DECOMPRESSION MICRODISCECTOMY: SHX5026

## 2019-05-21 LAB — GLUCOSE, CAPILLARY
Glucose-Capillary: 238 mg/dL — ABNORMAL HIGH (ref 70–99)
Glucose-Capillary: 173 mg/dL — ABNORMAL HIGH (ref 70–99)

## 2019-05-21 LAB — POCT PREGNANCY, URINE: Preg Test, Ur: NEGATIVE

## 2019-05-21 SURGERY — LUMBAR LAMINECTOMY/DECOMPRESSION MICRODISCECTOMY 1 LEVEL
Anesthesia: General | Site: Back | Laterality: Right

## 2019-05-21 MED ORDER — ALBUTEROL SULFATE HFA 108 (90 BASE) MCG/ACT IN AERS
INHALATION_SPRAY | RESPIRATORY_TRACT | Status: DC | PRN
  Administered 2019-05-21: 2 via RESPIRATORY_TRACT

## 2019-05-21 MED ORDER — LIDOCAINE 2% (20 MG/ML) 5 ML SYRINGE
INTRAMUSCULAR | Status: AC
  Filled 2019-05-21: qty 5

## 2019-05-21 MED ORDER — SODIUM CHLORIDE 0.9% FLUSH: 3.0000 mL | Freq: Two times a day (BID) | INTRAVENOUS | Status: DC

## 2019-05-21 MED ORDER — ONDANSETRON HCL 4 MG/2ML IJ SOLN
INTRAMUSCULAR | Status: DC | PRN
  Administered 2019-05-21: 4 mg via INTRAVENOUS

## 2019-05-21 MED ORDER — METFORMIN HCL ER 500 MG PO TB24: 1000.0000 mg | ORAL_TABLET | Freq: Two times a day (BID) | ORAL | Status: DC

## 2019-05-21 MED ORDER — CHLORHEXIDINE GLUCONATE CLOTH 2 % EX PADS: 6.0000 | MEDICATED_PAD | Freq: Once | CUTANEOUS | Status: DC

## 2019-05-21 MED ORDER — CELECOXIB 200 MG PO CAPS: 200.0000 mg | ORAL_CAPSULE | Freq: Two times a day (BID) | ORAL | Status: DC

## 2019-05-21 MED ORDER — SUCCINYLCHOLINE CHLORIDE 200 MG/10ML IV SOSY
PREFILLED_SYRINGE | INTRAVENOUS | Status: AC
  Filled 2019-05-21: qty 10

## 2019-05-21 MED ORDER — BUPIVACAINE HCL (PF) 0.25 % IJ SOLN
INTRAMUSCULAR | Status: DC | PRN
  Administered 2019-05-21: 4 mL

## 2019-05-21 MED ORDER — METHOCARBAMOL 1000 MG/10ML IJ SOLN: 500.0000 mg | Freq: Four times a day (QID) | INTRAVENOUS | Status: DC | PRN

## 2019-05-21 MED ORDER — SODIUM CHLORIDE 0.9 % IV SOLN: 250.0000 mL | INTRAVENOUS | Status: DC

## 2019-05-21 MED ORDER — BUPIVACAINE HCL (PF) 0.25 % IJ SOLN
INTRAMUSCULAR | Status: AC
  Filled 2019-05-21: qty 30

## 2019-05-21 MED ORDER — DEXAMETHASONE SODIUM PHOSPHATE 10 MG/ML IJ SOLN
INTRAMUSCULAR | Status: AC
  Filled 2019-05-21: qty 1

## 2019-05-21 MED ORDER — PROPOFOL 10 MG/ML IV BOLUS
INTRAVENOUS | Status: DC | PRN
  Administered 2019-05-21: 200 mg via INTRAVENOUS
  Administered 2019-05-21: 50 mg via INTRAVENOUS

## 2019-05-21 MED ORDER — KETOROLAC TROMETHAMINE 30 MG/ML IJ SOLN: 30.0000 mg | Freq: Once | INTRAMUSCULAR | Status: DC | PRN

## 2019-05-21 MED ORDER — ONDANSETRON HCL 4 MG/2ML IJ SOLN
INTRAMUSCULAR | Status: AC
  Filled 2019-05-21: qty 2

## 2019-05-21 MED ORDER — CANAGLIFLOZIN 100 MG PO TABS: 100.0000 mg | ORAL_TABLET | Freq: Every day | ORAL | Status: DC

## 2019-05-21 MED ORDER — DIVALPROEX SODIUM 250 MG PO DR TAB: 250.0000 mg | DELAYED_RELEASE_TABLET | ORAL | Status: DC

## 2019-05-21 MED ORDER — MIDAZOLAM HCL 5 MG/5ML IJ SOLN
INTRAMUSCULAR | Status: DC | PRN
  Administered 2019-05-21 (×2): 1 mg via INTRAVENOUS

## 2019-05-21 MED ORDER — ROCURONIUM BROMIDE 100 MG/10ML IV SOLN
INTRAVENOUS | Status: DC | PRN
  Administered 2019-05-21: 10 mg via INTRAVENOUS
  Administered 2019-05-21: 50 mg via INTRAVENOUS

## 2019-05-21 MED ORDER — DULOXETINE HCL 30 MG PO CPEP: 30.0000 mg | ORAL_CAPSULE | Freq: Every day | ORAL | Status: DC

## 2019-05-21 MED ORDER — ONDANSETRON HCL 4 MG PO TABS: 4.0000 mg | ORAL_TABLET | Freq: Four times a day (QID) | ORAL | Status: DC | PRN

## 2019-05-21 MED ORDER — ALBUTEROL SULFATE (2.5 MG/3ML) 0.083% IN NEBU: 2.5000 mg | INHALATION_SOLUTION | Freq: Four times a day (QID) | RESPIRATORY_TRACT | Status: DC | PRN

## 2019-05-21 MED ORDER — METHOCARBAMOL 500 MG PO TABS: 500.0000 mg | ORAL_TABLET | Freq: Four times a day (QID) | ORAL | Status: DC | PRN

## 2019-05-21 MED ORDER — 0.9 % SODIUM CHLORIDE (POUR BTL) OPTIME
TOPICAL | Status: DC | PRN
  Administered 2019-05-21: 1000 mL

## 2019-05-21 MED ORDER — SODIUM CHLORIDE 0.9% FLUSH: 3.0000 mL | INTRAVENOUS | Status: DC | PRN

## 2019-05-21 MED ORDER — SUGAMMADEX SODIUM 200 MG/2ML IV SOLN
INTRAVENOUS | Status: DC | PRN
  Administered 2019-05-21: 300 mg via INTRAVENOUS

## 2019-05-21 MED ORDER — SENNA 8.6 MG PO TABS: 1.0000 | ORAL_TABLET | Freq: Two times a day (BID) | ORAL | Status: DC

## 2019-05-21 MED ORDER — THROMBIN 5000 UNITS EX SOLR
OROMUCOSAL | Status: DC | PRN
  Administered 2019-05-21: 08:00:00 5 mL via TOPICAL

## 2019-05-21 MED ORDER — DEXAMETHASONE SODIUM PHOSPHATE 10 MG/ML IJ SOLN
INTRAMUSCULAR | Status: DC | PRN
  Administered 2019-05-21: 4 mg via INTRAVENOUS

## 2019-05-21 MED ORDER — SODIUM CHLORIDE 0.9 % IV SOLN
INTRAVENOUS | Status: DC | PRN
  Administered 2019-05-21: 08:00:00 500 mL

## 2019-05-21 MED ORDER — THROMBIN 5000 UNITS EX SOLR
CUTANEOUS | Status: DC | PRN
  Administered 2019-05-21 (×2): 5000 [IU] via TOPICAL

## 2019-05-21 MED ORDER — SUCCINYLCHOLINE CHLORIDE 20 MG/ML IJ SOLN
INTRAMUSCULAR | Status: DC | PRN
  Administered 2019-05-21: 140 mg via INTRAVENOUS

## 2019-05-21 MED ORDER — POTASSIUM CHLORIDE IN NACL 20-0.9 MEQ/L-% IV SOLN: INTRAVENOUS | Status: DC

## 2019-05-21 MED ORDER — ACETAMINOPHEN 500 MG PO TABS
1000.0000 mg | ORAL_TABLET | Freq: Once | ORAL | Status: AC
  Administered 2019-05-21: 06:00:00 1000 mg via ORAL
  Filled 2019-05-21: qty 2

## 2019-05-21 MED ORDER — ROCURONIUM BROMIDE 10 MG/ML (PF) SYRINGE
PREFILLED_SYRINGE | INTRAVENOUS | Status: AC
  Filled 2019-05-21: qty 10

## 2019-05-21 MED ORDER — HEMOSTATIC AGENTS (NO CHARGE) OPTIME
TOPICAL | Status: DC | PRN
  Administered 2019-05-21: 1 via TOPICAL

## 2019-05-21 MED ORDER — MIDAZOLAM HCL 2 MG/2ML IJ SOLN
INTRAMUSCULAR | Status: AC
  Filled 2019-05-21: qty 2

## 2019-05-21 MED ORDER — ALBUTEROL SULFATE HFA 108 (90 BASE) MCG/ACT IN AERS: 2.0000 | INHALATION_SPRAY | Freq: Four times a day (QID) | RESPIRATORY_TRACT | Status: DC | PRN

## 2019-05-21 MED ORDER — CEFAZOLIN SODIUM 1 G IJ SOLR
INTRAMUSCULAR | Status: AC
  Filled 2019-05-21: qty 10

## 2019-05-21 MED ORDER — ONDANSETRON HCL 4 MG/2ML IJ SOLN: 4.0000 mg | Freq: Four times a day (QID) | INTRAMUSCULAR | Status: DC | PRN

## 2019-05-21 MED ORDER — LISINOPRIL-HYDROCHLOROTHIAZIDE 10-12.5 MG PO TABS: 1.0000 | ORAL_TABLET | Freq: Every day | ORAL | Status: DC

## 2019-05-21 MED ORDER — PROMETHAZINE HCL 25 MG/ML IJ SOLN: 6.2500 mg | INTRAMUSCULAR | Status: DC | PRN

## 2019-05-21 MED ORDER — ALBUTEROL SULFATE HFA 108 (90 BASE) MCG/ACT IN AERS
INHALATION_SPRAY | RESPIRATORY_TRACT | Status: AC
  Filled 2019-05-21: qty 6.7

## 2019-05-21 MED ORDER — ACETAMINOPHEN 650 MG RE SUPP: 650.0000 mg | RECTAL | Status: DC | PRN

## 2019-05-21 MED ORDER — SODIUM CHLORIDE (PF) 0.9 % IJ SOLN
INTRAMUSCULAR | Status: AC
  Filled 2019-05-21: qty 20

## 2019-05-21 MED ORDER — PHENOL 1.4 % MT LIQD: 1.0000 | OROMUCOSAL | Status: DC | PRN

## 2019-05-21 MED ORDER — DEXAMETHASONE SODIUM PHOSPHATE 10 MG/ML IJ SOLN
10.0000 mg | Freq: Once | INTRAMUSCULAR | Status: DC
  Filled 2019-05-21: qty 1

## 2019-05-21 MED ORDER — PHENYLEPHRINE 40 MCG/ML (10ML) SYRINGE FOR IV PUSH (FOR BLOOD PRESSURE SUPPORT)
PREFILLED_SYRINGE | INTRAVENOUS | Status: DC | PRN
  Administered 2019-05-21 (×6): 80 ug via INTRAVENOUS

## 2019-05-21 MED ORDER — KETOROLAC TROMETHAMINE 30 MG/ML IJ SOLN
INTRAMUSCULAR | Status: DC | PRN
  Administered 2019-05-21: 30 mg via INTRAVENOUS

## 2019-05-21 MED ORDER — MOMETASONE FURO-FORMOTEROL FUM 100-5 MCG/ACT IN AERO: 2.0000 | INHALATION_SPRAY | Freq: Two times a day (BID) | RESPIRATORY_TRACT | Status: DC

## 2019-05-21 MED ORDER — PHENYLEPHRINE 40 MCG/ML (10ML) SYRINGE FOR IV PUSH (FOR BLOOD PRESSURE SUPPORT)
PREFILLED_SYRINGE | INTRAVENOUS | Status: AC
  Filled 2019-05-21: qty 20

## 2019-05-21 MED ORDER — LIDOCAINE 2% (20 MG/ML) 5 ML SYRINGE
INTRAMUSCULAR | Status: DC | PRN
  Administered 2019-05-21: 60 mg via INTRAVENOUS

## 2019-05-21 MED ORDER — THROMBIN 5000 UNITS EX SOLR
CUTANEOUS | Status: AC
  Filled 2019-05-21: qty 15000

## 2019-05-21 MED ORDER — SUGAMMADEX SODIUM 500 MG/5ML IV SOLN
INTRAVENOUS | Status: AC
  Filled 2019-05-21: qty 5

## 2019-05-21 MED ORDER — FENTANYL CITRATE (PF) 250 MCG/5ML IJ SOLN
INTRAMUSCULAR | Status: DC | PRN
  Administered 2019-05-21: 25 ug via INTRAVENOUS
  Administered 2019-05-21: 100 ug via INTRAVENOUS
  Administered 2019-05-21: 25 ug via INTRAVENOUS

## 2019-05-21 MED ORDER — ACETAMINOPHEN 325 MG PO TABS: 650.0000 mg | ORAL_TABLET | ORAL | Status: DC | PRN

## 2019-05-21 MED ORDER — OXYCODONE-ACETAMINOPHEN 5-325 MG PO TABS: 1.0000 | ORAL_TABLET | ORAL | 0 refills | Status: AC | PRN

## 2019-05-21 MED ORDER — GABAPENTIN 300 MG PO CAPS: 300.0000 mg | ORAL_CAPSULE | Freq: Three times a day (TID) | ORAL | Status: DC

## 2019-05-21 MED ORDER — FENTANYL CITRATE (PF) 250 MCG/5ML IJ SOLN
INTRAMUSCULAR | Status: AC
  Filled 2019-05-21: qty 5

## 2019-05-21 MED ORDER — MENTHOL 3 MG MT LOZG: 1.0000 | LOZENGE | OROMUCOSAL | Status: DC | PRN

## 2019-05-21 MED ORDER — OXYCODONE-ACETAMINOPHEN 5-325 MG PO TABS: 1.0000 | ORAL_TABLET | ORAL | Status: DC | PRN

## 2019-05-21 MED ORDER — HYDROCODONE-ACETAMINOPHEN 7.5-325 MG PO TABS: 1.0000 | ORAL_TABLET | Freq: Four times a day (QID) | ORAL | Status: DC

## 2019-05-21 MED ORDER — PRAZOSIN HCL 1 MG PO CAPS: 1.0000 mg | ORAL_CAPSULE | Freq: Every day | ORAL | Status: DC

## 2019-05-21 MED ORDER — CEFAZOLIN SODIUM-DEXTROSE 2-4 GM/100ML-% IV SOLN: 2.0000 g | Freq: Three times a day (TID) | INTRAVENOUS | Status: DC

## 2019-05-21 MED ORDER — HYDROMORPHONE HCL 1 MG/ML IJ SOLN: 0.2500 mg | INTRAMUSCULAR | Status: DC | PRN

## 2019-05-21 MED ORDER — LACTATED RINGERS IV SOLN
INTRAVENOUS | Status: DC | PRN
  Administered 2019-05-21 (×2): via INTRAVENOUS

## 2019-05-21 MED ORDER — PROPOFOL 10 MG/ML IV BOLUS
INTRAVENOUS | Status: AC
  Filled 2019-05-21: qty 40

## 2019-05-21 MED ORDER — INSULIN ASPART 100 UNIT/ML ~~LOC~~ SOLN: 0.0000 [IU] | Freq: Three times a day (TID) | SUBCUTANEOUS | Status: DC

## 2019-05-21 MED ORDER — MORPHINE SULFATE (PF) 2 MG/ML IV SOLN: 2.0000 mg | INTRAVENOUS | Status: DC | PRN

## 2019-05-21 SURGICAL SUPPLY — 55 items
BAG DECANTER FOR FLEXI CONT (MISCELLANEOUS) ×3 IMPLANT
BENZOIN TINCTURE PRP APPL 2/3 (GAUZE/BANDAGES/DRESSINGS) ×3 IMPLANT
BUR MATCHSTICK NEURO 3.0 LAGG (BURR) ×3 IMPLANT
CANISTER SUCT 3000ML PPV (MISCELLANEOUS) ×3 IMPLANT
CARTRIDGE OIL MAESTRO DRILL (MISCELLANEOUS) ×1 IMPLANT
CLOSURE STERI-STRIP 1/2X4 (GAUZE/BANDAGES/DRESSINGS) ×1
CLOSURE WOUND 1/2 X4 (GAUZE/BANDAGES/DRESSINGS) ×1
CLSR STERI-STRIP ANTIMIC 1/2X4 (GAUZE/BANDAGES/DRESSINGS) ×1 IMPLANT
COVER WAND RF STERILE (DRAPES) ×3 IMPLANT
DERMABOND ADVANCED (GAUZE/BANDAGES/DRESSINGS) ×2
DERMABOND ADVANCED .7 DNX12 (GAUZE/BANDAGES/DRESSINGS) IMPLANT
DIFFUSER DRILL AIR PNEUMATIC (MISCELLANEOUS) ×3 IMPLANT
DRAPE LAPAROTOMY 100X72X124 (DRAPES) ×3 IMPLANT
DRAPE MICROSCOPE LEICA (MISCELLANEOUS) ×3 IMPLANT
DRAPE POUCH INSTRU U-SHP 10X18 (DRAPES) ×3 IMPLANT
DRAPE SURG 17X23 STRL (DRAPES) ×3 IMPLANT
DRSG OPSITE POSTOP 3X4 (GAUZE/BANDAGES/DRESSINGS) ×2 IMPLANT
DRSG OPSITE POSTOP 4X6 (GAUZE/BANDAGES/DRESSINGS) ×2 IMPLANT
DURAPREP 26ML APPLICATOR (WOUND CARE) ×3 IMPLANT
ELECT REM PT RETURN 9FT ADLT (ELECTROSURGICAL) ×3
ELECTRODE REM PT RTRN 9FT ADLT (ELECTROSURGICAL) ×1 IMPLANT
GAUZE 4X4 16PLY RFD (DISPOSABLE) IMPLANT
GLOVE BIO SURGEON STRL SZ7 (GLOVE) ×2 IMPLANT
GLOVE BIO SURGEON STRL SZ8 (GLOVE) ×3 IMPLANT
GLOVE BIOGEL PI IND STRL 7.0 (GLOVE) IMPLANT
GLOVE BIOGEL PI IND STRL 8 (GLOVE) IMPLANT
GLOVE BIOGEL PI INDICATOR 7.0 (GLOVE) ×4
GLOVE BIOGEL PI INDICATOR 8 (GLOVE) ×4
GLOVE ECLIPSE 7.5 STRL STRAW (GLOVE) ×6 IMPLANT
GOWN STRL REUS W/ TWL LRG LVL3 (GOWN DISPOSABLE) IMPLANT
GOWN STRL REUS W/ TWL XL LVL3 (GOWN DISPOSABLE) ×1 IMPLANT
GOWN STRL REUS W/TWL 2XL LVL3 (GOWN DISPOSABLE) ×2 IMPLANT
GOWN STRL REUS W/TWL LRG LVL3 (GOWN DISPOSABLE) ×2
GOWN STRL REUS W/TWL XL LVL3 (GOWN DISPOSABLE) ×2
HEMOSTAT POWDER KIT SURGIFOAM (HEMOSTASIS) ×2 IMPLANT
KIT BASIN OR (CUSTOM PROCEDURE TRAY) ×3 IMPLANT
KIT TURNOVER KIT B (KITS) ×3 IMPLANT
NDL HYPO 25X1 1.5 SAFETY (NEEDLE) ×1 IMPLANT
NDL SPNL 20GX3.5 QUINCKE YW (NEEDLE) IMPLANT
NEEDLE HYPO 25X1 1.5 SAFETY (NEEDLE) ×3 IMPLANT
NEEDLE SPNL 20GX3.5 QUINCKE YW (NEEDLE) ×3 IMPLANT
NS IRRIG 1000ML POUR BTL (IV SOLUTION) ×3 IMPLANT
OIL CARTRIDGE MAESTRO DRILL (MISCELLANEOUS) ×3
PACK LAMINECTOMY NEURO (CUSTOM PROCEDURE TRAY) ×3 IMPLANT
PAD ARMBOARD 7.5X6 YLW CONV (MISCELLANEOUS) ×9 IMPLANT
RUBBERBAND STERILE (MISCELLANEOUS) ×6 IMPLANT
SPONGE SURGIFOAM ABS GEL SZ50 (HEMOSTASIS) ×2 IMPLANT
STRIP CLOSURE SKIN 1/2X4 (GAUZE/BANDAGES/DRESSINGS) ×2 IMPLANT
SUT VIC AB 0 CT1 18XCR BRD8 (SUTURE) ×1 IMPLANT
SUT VIC AB 0 CT1 8-18 (SUTURE) ×2
SUT VIC AB 2-0 CP2 18 (SUTURE) ×3 IMPLANT
SUT VIC AB 3-0 SH 8-18 (SUTURE) ×5 IMPLANT
TOWEL GREEN STERILE (TOWEL DISPOSABLE) ×3 IMPLANT
TOWEL GREEN STERILE FF (TOWEL DISPOSABLE) ×3 IMPLANT
WATER STERILE IRR 1000ML POUR (IV SOLUTION) ×3 IMPLANT

## 2019-05-21 NOTE — Anesthesia Procedure Notes (Signed)
Procedure Name: Intubation Date/Time: 05/21/2019 7:53 AM Performed by: Janene Harvey, CRNA Pre-anesthesia Checklist: Patient identified, Emergency Drugs available, Suction available and Patient being monitored Patient Re-evaluated:Patient Re-evaluated prior to induction Oxygen Delivery Method: Circle system utilized Preoxygenation: Pre-oxygenation with 100% oxygen Induction Type: IV induction Laryngoscope Size: Mac, 3 and Glidescope Grade View: Grade I Tube type: Oral Tube size: 7.0 mm Number of attempts: 1 Airway Equipment and Method: Stylet Placement Confirmation: ETT inserted through vocal cords under direct vision,  positive ETCO2 and breath sounds checked- equal and bilateral Secured at: 22 cm Tube secured with: Tape Dental Injury: Teeth and Oropharynx as per pre-operative assessment

## 2019-05-21 NOTE — H&P (Signed)
Subjective: Patient is a 33 y.o. female admitted for R leg pain. Onset of symptoms was several months ago, gradually worsening since that time.  The pain is rated severe, and is located at the across the lower back and radiates to RLE. The pain is described as aching and occurs all day. The symptoms have been progressive. Symptoms are exacerbated by exercise. 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 Anxiety   . Arthritis    left knee  . Asthma   . Chronic abdominal pain   . Chronic headaches   . Depression   . Diabetes mellitus type 2 in obese ([place]) 11/20/2016  . Endometriosis   . HLD (hyperlipidemia) 02/17/2017  . Hypertension   . 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 Mood disorder ([place])   . Obesity   . Ovarian cyst   . Tobacco abuse     Past Surgical History:  Procedure Laterality Date  . CESAREAN SECTION  2008   [place]  . CHOLECYSTECTOMY N/A 11/25/2014   Procedure: LAPAROSCOPIC CHOLECYSTECTOMY;  Surgeon: Aviva Signs Md, MD;  Location: [facility];  Service: General;  Laterality: N/A;  . ESOPHAGOGASTRODUODENOSCOPY N/A 12/21/2015   Dr. Gala Romney: normal esophagus, non-bleeding erosive gastropathy, normal second portion of duodenum. Reactive gastropathy/chemical gastritis, negative H.pylori  . FEMUR FRACTURE SURGERY Left 1995   tree fell on her  . HERNIA REPAIR    . INCISIONAL HERNIA REPAIR N/A 01/29/2016   Procedure: Fatima Blank HERNIORRHAPHY WITH MESH;  Surgeon: Aviva Signs, MD;  Location: [facility];  Service: General;  Laterality: N/A;  . INSERTION OF MESH  01/29/2016   Procedure: INSERTION OF MESH;  Surgeon: Aviva Signs, MD;  Location: [facility];  Service: General;;  . KNEE ARTHROSCOPY Left   . TUBAL LIGATION      Prior to Admission medications   Medication Sig Start Date End Date Taking? Authorizing Provider  albuterol (PROVENTIL) (2.5 MG/3ML) 0.083% nebulizer solution Take 3 mLs (2.5 mg total) by  nebulization every 6 (six) hours as needed for wheezing or shortness of breath. 03/25/19  Yes Rakes, Connye Burkitt, FNP  albuterol (VENTOLIN HFA) 108 (90 Base) MCG/ACT inhaler Inhale 2 puffs into the lungs every 6 (six) hours as needed for wheezing or shortness of breath. 03/25/19  Yes Rakes, Connye Burkitt, FNP  budesonide-formoterol (SYMBICORT) 80-4.5 MCG/ACT inhaler Inhale 2 puffs into the lungs 2 (two) times daily. 03/25/19  Yes Rakes, Connye Burkitt, FNP  cetirizine (ZYRTEC) 10 MG tablet Take 1 tablet (10 mg total) by mouth daily. 11/17/18  Yes Rakes, Connye Burkitt, FNP  divalproex (DEPAKOTE) 250 MG DR tablet Take 250-500 mg by mouth See admin instructions. Take 500 mg by mouth in the morning and 250 mg in the afternoon   Yes [provider]  DULoxetine (CYMBALTA) 30 MG capsule Take 30-60 mg by mouth daily. Take 60 mg by mouth in the morning and 30 mg in the evening   Yes [provider]  empagliflozin (JARDIANCE) 10 MG TABS tablet Take 10 mg by mouth daily. 03/25/19  Yes Rakes, Connye Burkitt, FNP  gabapentin (NEURONTIN) 300 MG capsule Take 300 mg by mouth 3 (three) times daily.    Yes [provider]  lisinopril-hydrochlorothiazide (ZESTORETIC) 10-12.5 MG tablet Take 1 tablet by mouth daily. 03/25/19  Yes Rakes, Connye Burkitt, FNP  LORazepam (ATIVAN) 0.5 MG tablet Take 1 tablet (0.5 mg total) by mouth daily as needed  for anxiety. Patient taking differently: Take 0.5 mg by mouth at bedtime.  03/03/19  Yes Hisada, Elie Goody, MD  lovastatin (MEVACOR) 20 MG tablet Take 20 mg by mouth at bedtime.   Yes [provider]  metFORMIN (GLUCOPHAGE XR) 500 MG 24 hr tablet Take 2 tablets (1,000 mg total) by mouth 2 (two) times daily. 03/25/19  Yes Rakes, Connye Burkitt, FNP  methocarbamol (ROBAXIN) 500 MG tablet Take 500 mg by mouth 3 (three) times daily as needed for muscle spasms.  08/18/18  Yes [provider]  pravastatin (PRAVACHOL) 40 MG tablet Take 1 tablet (40 mg total) by mouth daily. 11/19/18  Yes Rakes, Connye Burkitt, FNP   prazosin (MINIPRESS) 1 MG capsule Take 1 capsule (1 mg total) by mouth at bedtime. 03/03/19  Yes Hisada, Elie Goody, MD  traMADol (ULTRAM) 50 MG tablet Take 50 mg by mouth every 6 (six) hours as needed for moderate pain.    Yes [provider]  DULoxetine (CYMBALTA) 60 MG capsule Take 1 capsule (60 mg total) by mouth 2 (two) times daily. Patient not taking: Reported on 05/13/2019 03/03/19   Norman Clay, MD  montelukast (SINGULAIR) 10 MG tablet Take 1 tablet (10 mg total) by mouth at bedtime. Patient not taking: Reported on 05/13/2019 03/25/19   Baruch Gouty, FNP   Allergies  Allergen Reactions  . Tramadol Other (See Comments)    Exacerbates her asthma     Social History   Tobacco Use  . Smoking status: Current Every Day Smoker    Packs/day: 0.50    Years: 11.00    Pack years: 5.50    Types: Cigarettes    Start date: 09/17/2003  . Smokeless tobacco: Never Used  Substance Use Topics  . Alcohol use: No    Family History  Problem Relation Age of Onset  . Hypertension Mother   . Hyperlipidemia Mother   . Fibromyalgia Mother   . Other Mother        degenerative disc disease  . Arthritis Mother   . Kidney disease Mother   . Diabetes Father   . Hypertension Father   . Hyperlipidemia Father   . Heart disease Father 19  . Heart attack Father   . Stroke Father   . Hyperlipidemia Brother   . Hypertension Brother   . Aneurysm Maternal Grandmother        AAA  . Kidney disease Maternal Grandmother   . Kidney disease Maternal Grandfather   . Aneurysm Paternal Grandmother        AAA  . Kidney disease Paternal Grandmother   . Kidney disease Paternal Grandfather   . Colon cancer Neg Hx   . Inflammatory bowel disease Neg Hx      Review of Systems  Positive ROS: neg  All other systems have been reviewed and were otherwise negative with the exception of those mentioned in the HPI and as above.  Objective: Vital signs in last 24 hours: Temp:  [98.3 F (36.8 C)] 98.3 F (36.8  C) (09/04 0540) Pulse Rate:  [115] 115 (09/04 0540) Resp:  [20] 20 (09/04 0540) BP: (152)/(80) 152/80 (09/04 0540) SpO2:  [96 %] 96 % (09/04 0540)  General Appearance: Alert, cooperative, no distress, appears stated age Head: Normocephalic, without obvious abnormality, atraumatic Eyes: PERRL, conjunctiva/corneas clear, EOM's intact    Neck: Supple, symmetrical, trachea midline Back: Symmetric, no curvature, ROM normal, no CVA tenderness Lungs:  respirations unlabored Heart: Regular rate and rhythm Abdomen: Soft, non-tender Extremities: Extremities normal, atraumatic,  no cyanosis or edema Pulses: 2+ and symmetric all extremities Skin: Skin color, texture, turgor normal, no rashes or lesions  NEUROLOGIC:   Mental status: Alert and oriented x4,  no aphasia, good attention span, fund of knowledge, and memory Motor Exam - grossly normal Sensory Exam - grossly normal Reflexes: trace Coordination - grossly normal Gait - grossly normal Balance - grossly normal Cranial Nerves: I: smell Not tested  II: visual acuity  OS: nl    OD: nl  II: visual fields Full to confrontation  II: pupils Equal, round, reactive to light  III,VII: ptosis None  III,IV,VI: extraocular muscles  Full ROM  V: mastication Normal  V: facial light touch sensation  Normal  V,VII: corneal reflex  Present  VII: facial muscle function - upper  Normal  VII: facial muscle function - lower Normal  VIII: hearing Not tested  IX: soft palate elevation  Normal  IX,X: gag reflex Present  XI: trapezius strength  5/5  XI: sternocleidomastoid strength 5/5  XI: neck flexion strength  5/5  XII: tongue strength  Normal    Data Review Lab Results  Component Value Date   WBC 14.5 (H) 05/18/2019   HGB 12.3 05/18/2019   HCT 38.4 05/18/2019   MCV 76.8 (L) 05/18/2019   PLT 322 05/18/2019   Lab Results  Component Value Date   NA 136 05/18/2019   K 4.1 05/18/2019   CL 103 05/18/2019   CO2 21 (L) 05/18/2019   BUN 6  05/18/2019   CREATININE 0.50 05/18/2019   GLUCOSE 197 (H) 05/18/2019   Lab Results  Component Value Date   INR 0.9 05/18/2019    Assessment/Plan:  Estimated body mass index is 50.55 kg/m as calculated from the following:   Height as of 05/18/19: 5\' 5"  (1.651 m).   Weight as of 05/18/19: 137.8 kg. Patient admitted for R L4-5 microdiskectomy. Patient has failed a reasonable attempt at conservative therapy.  I explained the condition and procedure to the patient and answered any questions.  Patient wishes to proceed with procedure as planned. Understands risks/ benefits and typical outcomes of procedure.   Eustace Moore 05/21/2019 7:34 AM

## 2019-05-21 NOTE — Anesthesia Postprocedure Evaluation (Signed)
Anesthesia Post Note  Patient: Patricia Stanton  Procedure(s) Performed: Microdiscectomy - Lumbar four-Lumbar five - right (Right Back)     Patient location during evaluation: PACU Anesthesia Type: General Level of consciousness: awake and alert Pain management: pain level controlled Vital Signs Assessment: post-procedure vital signs reviewed and stable Respiratory status: spontaneous breathing, nonlabored ventilation, respiratory function stable and patient connected to nasal cannula oxygen Cardiovascular status: blood pressure returned to baseline and stable Postop Assessment: no apparent nausea or vomiting Anesthetic complications: no    Last Vitals:  Vitals:   05/21/19 0950 05/21/19 1025  BP: 123/88   Pulse: 88   Resp: 15   Temp:  36.6 C  SpO2: 94%     Last Pain:  Vitals:   05/21/19 0950  TempSrc:   PainSc: 0-No pain                 Ryan P Ellender

## 2019-05-21 NOTE — Transfer of Care (Signed)
Immediate Anesthesia Transfer of Care Note  Patient: Patricia Stanton  Procedure(s) Performed: Microdiscectomy - Lumbar four-Lumbar five - right (Right Back)  Patient Location: PACU  Anesthesia Type:General  Level of Consciousness: drowsy  Airway & Oxygen Therapy: Patient Spontanous Breathing and Patient connected to face mask oxygen  Post-op Assessment: Report given to RN and Post -op Vital signs reviewed and stable  Post vital signs: Reviewed  Last Vitals:  Vitals Value Taken Time  BP 108/70 05/21/19 0922  Temp    Pulse 94 05/21/19 0926  Resp 21 05/21/19 0926  SpO2 93 % 05/21/19 0926  Vitals shown include unvalidated device data.  Last Pain:  Vitals:   05/21/19 0620  TempSrc:   PainSc: 8          Complications: No apparent anesthesia complications. Pt moving extremities x4.

## 2019-05-21 NOTE — Discharge Instructions (Signed)

## 2019-05-21 NOTE — Discharge Summary (Signed)
Physician Discharge Summary  Patient ID: Patricia [place] MRN: [id number] DOB/AGE: 01-18-1986 33 y.o.  Admit date: 05/21/2019 Discharge date: 05/21/2019  Admission Diagnoses: HNP L4-5    Discharge Diagnoses: same   Discharged Condition: good  Hospital Course: The patient was admitted on 05/21/2019 and taken to the operating room where the patient underwent microdiskectomy. The patient tolerated the procedure well and was taken to the recovery room and then to the floor in stable condition. The hospital course was routine. There were no complications. The wound remained clean dry and intact. Pt had appropriate back soreness. No complaints of leg pain or new N/T/W. The patient remained afebrile with stable vital signs, and tolerated a regular diet. The patient continued to increase activities, and pain was well controlled with oral pain medications.   Consults: None  Significant Diagnostic Studies:  Results for orders placed or performed during the hospital encounter of 05/21/19  Glucose, capillary  Result Value Ref Range   Glucose-Capillary 238 (H) 70 - 99 mg/dL  Glucose, capillary  Result Value Ref Range   Glucose-Capillary 173 (H) 70 - 99 mg/dL  Pregnancy, urine POC  Result Value Ref Range   Preg Test, Ur NEGATIVE NEGATIVE    Chest 2 View  Result Date: 05/21/2019 CLINICAL DATA:  Preoperative evaluation for upcoming back surgery EXAM: CHEST - 2 VIEW COMPARISON:  10/08/2018 FINDINGS: The heart size and mediastinal contours are within normal limits. Both lungs are clear. The visualized skeletal structures are unremarkable. IMPRESSION: No active cardiopulmonary disease. Electronically Signed   By: Inez Catalina M.D.   On: 05/21/2019 06:18   Mr Lumbar Spine Wo Contrast  Result Date: 04/28/2019 CLINICAL DATA:  Low back pain extending into both legs for 3 years. No acute injury or prior relevant surgery. EXAM: MRI LUMBAR SPINE WITHOUT CONTRAST TECHNIQUE: Multiplanar, multisequence MR imaging  of the lumbar spine was performed. No intravenous contrast was administered. COMPARISON:  Abdominopelvic CT 09/22/2017, lumbar spine radiographs 07/28/2017 and lumbar MRI 06/28/2015. FINDINGS: Segmentation: Conventional anatomy assumed, with the last open disc space designated L5-S1.Concordant with previous imaging. Alignment:  Straightening without focal angulation or listhesis. Vertebrae: No worrisome osseous lesion, acute fracture or pars defect. The visualized sacroiliac joints appear unremarkable. Conus medullaris: Extends to the L1 level and appears normal. Paraspinal and other soft tissues: No significant paraspinal findings. Disc levels: Sagittal images demonstrate mild disc bulging at T11-12 without resulting spinal stenosis or nerve root encroachment. The T12-L1 and L1-2 disc space levels appear normal. L2-3: Stable mild loss of disc height with annular disc bulging and a shallow right paracentral disc protrusion. Mild facet hypertrophy. No spinal stenosis or nerve root encroachment. L3-4: Stable mild disc bulging and shallow disc protrusion in the right subarticular zone. No spinal stenosis or nerve root encroachment. L4-5: A disc protrusion in the right subarticular zone has significantly enlarged in the interval, resulting in posterior displacement of the thecal sac and probable encroachment on the right L5 nerve root. Mild facet and ligamentous hypertrophy. Both foramina are patent. L5-S1: Normal interspace. IMPRESSION: 1. Significant enlargement of right paracentral disc protrusion at L4-5 compared with previous MRI from 2016. This displaces the right L5 nerve root posteriorly and is likely symptomatic. 2. Stable shallow disc protrusions on the right at L2-3 and L3-4 without nerve root encroachment. 3. No other significant findings. Electronically Signed   By: Richardean Sale M.D.   On: 04/28/2019 12:52    Antibiotics:  Anti-infectives (From admission, onward)   Start     Dose/Rate  Route  Frequency Ordered Stop   05/21/19 1045  ceFAZolin (ANCEF) IVPB 2g/100 mL premix     2 g 200 mL/hr over 30 Minutes Intravenous Every 8 hours 05/21/19 1035 05/22/19 0244   05/21/19 0822  bacitracin 50,000 Units in sodium chloride 0.9 % 500 mL irrigation  Status:  Discontinued       As needed 05/21/19 0823 05/21/19 0916   05/21/19 0630  ceFAZolin (ANCEF) 3 g in dextrose 5 % 50 mL IVPB     3 g 100 mL/hr over 30 Minutes Intravenous To [facility] 05/20/19 1427 05/21/19 0811      Discharge Exam: Blood pressure 123/88, pulse 88, temperature 97.9 F (36.6 C), resp. rate 15, last menstrual period 05/10/2019, SpO2 94 %. Neurologic: Grossly normal Dressing dry  Discharge Medications:   Allergies as of 05/21/2019      Reactions   Tramadol Other (See Comments)   Exacerbates her asthma       Medication List    STOP taking these medications   traMADol 50 MG tablet Commonly known as: ULTRAM     TAKE these medications   albuterol (2.5 MG/3ML) 0.083% nebulizer solution Commonly known as: PROVENTIL Take 3 mLs (2.5 mg total) by nebulization every 6 (six) hours as needed for wheezing or shortness of breath.   albuterol 108 (90 Base) MCG/ACT inhaler Commonly known as: VENTOLIN HFA Inhale 2 puffs into the lungs every 6 (six) hours as needed for wheezing or shortness of breath.   budesonide-formoterol 80-4.5 MCG/ACT inhaler Commonly known as: SYMBICORT Inhale 2 puffs into the lungs 2 (two) times daily.   cetirizine 10 MG tablet Commonly known as: ZYRTEC Take 1 tablet (10 mg total) by mouth daily.   divalproex 250 MG DR tablet Commonly known as: DEPAKOTE Take 250-500 mg by mouth See admin instructions. Take 500 mg by mouth in the morning and 250 mg in the afternoon   DULoxetine 30 MG capsule Commonly known as: CYMBALTA Take 30-60 mg by mouth daily. Take 60 mg by mouth in the morning and 30 mg in the evening   DULoxetine 60 MG capsule Commonly known as: CYMBALTA Take 1 capsule  (60 mg total) by mouth 2 (two) times daily.   empagliflozin 10 MG Tabs tablet Commonly known as: JARDIANCE Take 10 mg by mouth daily.   gabapentin 300 MG capsule Commonly known as: NEURONTIN Take 300 mg by mouth 3 (three) times daily.   lisinopril-hydrochlorothiazide 10-12.5 MG tablet Commonly known as: ZESTORETIC Take 1 tablet by mouth daily.   LORazepam 0.5 MG tablet Commonly known as: ATIVAN Take 1 tablet (0.5 mg total) by mouth daily as needed for anxiety. What changed: when to take this   lovastatin 20 MG tablet Commonly known as: MEVACOR Take 20 mg by mouth at bedtime.   metFORMIN 500 MG 24 hr tablet Commonly known as: Glucophage XR Take 2 tablets (1,000 mg total) by mouth 2 (two) times daily.   methocarbamol 500 MG tablet Commonly known as: ROBAXIN Take 500 mg by mouth 3 (three) times daily as needed for muscle spasms.   montelukast 10 MG tablet Commonly known as: SINGULAIR Take 1 tablet (10 mg total) by mouth at bedtime.   oxyCODONE-acetaminophen 5-325 MG tablet Commonly known as: PERCOCET/ROXICET Take 1 tablet by mouth every 4 (four) hours as needed for moderate pain.   pravastatin 40 MG tablet Commonly known as: PRAVACHOL Take 1 tablet (40 mg total) by mouth daily.   prazosin 1 MG capsule Commonly known as: MINIPRESS  Take 1 capsule (1 mg total) by mouth at bedtime.       Disposition: home   Final Dx: LL L4-5  Discharge Instructions    Diet - low sodium heart healthy   Complete by: As directed    Increase activity slowly   Complete by: As directed          Signed: Eustace Moore 05/21/2019, 10:38 AM

## 2019-05-21 NOTE — Op Note (Signed)
05/21/2019  9:10 AM  PATIENT:  Patricia Stanton  33 y.o. 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 Imaging showed large herniated disc L4-5 right. The patient tried conservative measures without relief. Pain was debilitating. Recommended right L4-5 microdiscectomy. Patient understood the risks, benefits, and alternatives and potential outcomes and wished to proceed.  PROCEDURE DETAILS: The patient was taken to the operating room and after induction of adequate generalized endotracheal anesthesia, the patient was rolled into the prone position on the Wilson frame and all pressure points were padded. The lumbar region was cleaned and then prepped with DuraPrep and draped in the usual sterile fashion. 5 cc of local anesthesia was injected and then a dorsal midline incision was made and carried down to the lumbo sacral fascia. The fascia was opened and the paraspinous musculature was taken down in a subperiosteal fashion to expose L4-5 on the right. Intraoperative x-ray confirmed my level, and then I used a combination of the high-speed drill and the Kerrison punches to perform a hemilaminectomy, medial facetectomy, and foraminotomy at L4-5 on the right. The underlying yellow ligament was opened and removed in a piecemeal fashion to expose the underlying dura and exiting nerve root. I undercut the lateral recess and dissected down until I was medial to and distal to the pedicle. The nerve root was well  decompressed. We then gently retracted the nerve root medially with a retractor, coagulated the epidural venous vasculature, and incised the disc space. we performed a thorough intradiscal discectomy with pituitary rongeurs and curettes, until I had a nice decompression of the nerve root and the midline. I then palpated with a coronary dilator along the nerve root and into the foramen to assure adequate decompression. I felt no more compression of the nerve root. I irrigated with saline solution containing bacitracin. Achieved hemostasis with bipolar cautery, lined the dura with Gelfoam, and then closed the fascia with 0 Vicryl. I closed the subcutaneous tissues with 2-0 Vicryl and the subcuticular tissues with 3-0 Vicryl. The skin was then closed with benzoin and Steri-Strips. The drapes were removed, a sterile dressing was applied. The patient was awakened from general anesthesia and transferred to the recovery room in stable condition. At the end of the procedure all sponge, needle and instrument counts were correct.    PLAN OF CARE: Admit for overnight observation  PATIENT DISPOSITION:  PACU - hemodynamically stable.   Delay start of Pharmacological VTE agent (>24hrs) due to surgical blood loss or risk of bleeding:  yes

## 2019-05-22 ENCOUNTER — Encounter (HOSPITAL_COMMUNITY): Payer: Self-pay | Admitting: Neurological Surgery

## 2019-06-25 ENCOUNTER — Ambulatory Visit: Payer: Medicaid Other | Admitting: Family Medicine

## 2019-06-28 ENCOUNTER — Encounter: Payer: Self-pay | Admitting: Family Medicine

## 2019-08-05 ENCOUNTER — Encounter: Payer: Self-pay | Admitting: Family

## 2019-08-05 ENCOUNTER — Ambulatory Visit: Payer: Medicaid Other | Admitting: Family

## 2019-08-05 ENCOUNTER — Other Ambulatory Visit: Payer: Self-pay

## 2019-08-05 VITALS — BP 124/83 | HR 112 | Temp 97.1°F | Ht 65.0 in | Wt 294.6 lb

## 2019-08-05 DIAGNOSIS — N3001 Acute cystitis with hematuria: Secondary | ICD-10-CM

## 2019-08-05 DIAGNOSIS — J019 Acute sinusitis, unspecified: Secondary | ICD-10-CM | POA: Diagnosis not present

## 2019-08-05 DIAGNOSIS — R300 Dysuria: Secondary | ICD-10-CM

## 2019-08-05 DIAGNOSIS — N898 Other specified noninflammatory disorders of vagina: Secondary | ICD-10-CM

## 2019-08-05 LAB — URINALYSIS, COMPLETE
Bilirubin, UA: NEGATIVE
Nitrite, UA: NEGATIVE
Specific Gravity, UA: >1.03 — ABNORMAL HIGH (ref 1.005–1.030)
Urobilinogen, Ur: 0.2 mg/dL (ref 0.2–1.0)
pH, UA: 5.5 (ref 5.0–7.5)

## 2019-08-05 LAB — WET PREP FOR TRICH, YEAST, CLUE
Clue Cell Exam: POSITIVE — AB
Trichomonas Exam: NEGATIVE
Yeast Exam: NEGATIVE

## 2019-08-05 LAB — MICROSCOPIC EXAMINATION
Epithelial Cells (non renal): >10 /hpf — AB (ref 0–10)
Renal Epithel, UA: NONE SEEN /hpf

## 2019-08-05 MED ORDER — AMOXICILLIN-POT CLAVULANATE 875-125 MG PO TABS: 1.0000 | ORAL_TABLET | Freq: Two times a day (BID) | ORAL | 0 refills | Status: DC

## 2019-08-05 NOTE — Patient Instructions (Signed)

## 2019-08-05 NOTE — Progress Notes (Signed)
Subjective:    Patient ID: Patricia Stanton, female    DOB: 12-12-1985, 33 y.o.   MRN: [id number]  Chief Complaint  Patient presents with  . Dysuria  . Vaginal Itching  . Sinus Problem    X5 DAYS STATES IT IS PRESSURE BEHIND HER EYES.    Dysuria  This is a new problem. The current episode started 1 to 4 weeks ago. The problem occurs every urination. The problem has been waxing and waning. The quality of the pain is described as burning. The patient is experiencing no pain. Associated symptoms include a discharge (brown), flank pain, frequency, hesitancy and urgency. Pertinent negatives include no hematuria, nausea or vomiting. She has tried increased fluids for the symptoms. The treatment provided mild relief.  Vaginal Itching The patient's primary symptoms include genital itching, vaginal bleeding and vaginal discharge (last week). The patient's pertinent negatives include no genital odor or genital rash. This is a new problem. The current episode started 1 to 4 weeks ago. Associated symptoms include dysuria, flank pain, frequency and urgency. Pertinent negatives include no hematuria, nausea, sore throat or vomiting. The vaginal discharge was brown. She has tried nothing for the symptoms.  Sinus Problem This is a new problem. The current episode started 1 to 4 weeks ago (last week). The problem has been gradually improving since onset. There has been no fever. Associated symptoms include sinus pressure. Pertinent negatives include no congestion, coughing, ear pain, neck pain or sore throat. Past treatments include oral decongestants. The treatment provided mild relief.      Review of Systems  HENT: Positive for sinus pressure. Negative for congestion, ear pain and sore throat.   Respiratory: Negative for cough.   Gastrointestinal: Negative for nausea and vomiting.  Genitourinary: Positive for dysuria, flank pain, frequency, hesitancy, urgency and vaginal discharge (last week). Negative for  hematuria.  Musculoskeletal: Negative for neck pain.  All other systems reviewed and are negative.      Objective:   Physical Exam Vitals signs reviewed.  Constitutional:      General: She is not in acute distress.    Appearance: She is well-developed. She is obese.  HENT:     Head: Normocephalic and atraumatic.     Nose:     Right Sinus: Maxillary sinus tenderness present.     Left Sinus: Maxillary sinus tenderness present.  Eyes:     Pupils: Pupils are equal, round, and reactive to light.  Neck:     Musculoskeletal: Normal range of motion and neck supple.     Thyroid: No thyromegaly.  Cardiovascular:     Rate and Rhythm: Normal rate and regular rhythm.     Heart sounds: Normal heart sounds. No murmur.  Pulmonary:     Effort: Pulmonary effort is normal. No respiratory distress.     Breath sounds: Normal breath sounds. No wheezing.  Abdominal:     General: Bowel sounds are normal. There is no distension.     Palpations: Abdomen is soft.     Tenderness: There is no abdominal tenderness.  Musculoskeletal: Normal range of motion.        General: No tenderness.     Comments: Negative CVA tenderness  Skin:    General: Skin is warm and dry.  Neurological:     Mental Status: She is alert and oriented to person, place, and time.     Cranial Nerves: No cranial nerve deficit.     Deep Tendon Reflexes: Reflexes are normal and symmetric.  Psychiatric:  Behavior: Behavior normal.        Thought Content: Thought content normal.        Judgment: Judgment normal.      BP 124/83   Pulse (!) 112   Temp (!) 97.1 F (36.2 C) (Temporal)   Ht 5\' 5"  (1.651 m)   Wt 294 lb 9.6 oz (133.6 kg)   SpO2 100%   BMI 49.02 kg/m      Assessment & Plan:  Patricia Stanton comes in today with chief complaint of Dysuria, Vaginal Itching, and Sinus Problem (X5 DAYS STATES IT IS PRESSURE BEHIND HER EYES.)   Diagnosis and orders addressed:  1. Dysuria - urinalysis- dip and micro - Urine  culture  2. Acute cystitis with hematuria Force fluids AZO over the counter X2 days RTO prn Culture pending - amoxicillin-clavulanate (AUGMENTIN) 875-125 MG tablet; Take 1 tablet by mouth 2 (two) times daily.  Dispense: 14 tablet; Refill: 0  3. Vaginal discharge - WET PREP FOR [place], YEAST, CLUE  4. Acute non-recurrent sinusitis, unspecified location - Take meds as prescribed - Use a cool mist humidifier  -Use saline nose sprays frequently -Force fluids -For any cough or congestion  Use plain Mucinex- regular strength or max strength is fine -For fever or aces or pains- take tylenol or ibuprofen. -Throat lozenges if help -RTO if symptoms worsen or do not improve  - amoxicillin-clavulanate (AUGMENTIN) 875-125 MG tablet; Take 1 tablet by mouth 2 (two) times daily.  Dispense: 14 tablet; Refill: 0  Evelina Dun, FNP

## 2019-08-08 LAB — URINE CULTURE

## 2019-08-10 ENCOUNTER — Other Ambulatory Visit: Payer: Self-pay | Admitting: Family

## 2019-08-10 MED ORDER — METRONIDAZOLE 500 MG PO TABS: 500.0000 mg | ORAL_TABLET | Freq: Two times a day (BID) | ORAL | 0 refills | Status: DC

## 2019-11-12 ENCOUNTER — Ambulatory Visit (INDEPENDENT_AMBULATORY_CARE_PROVIDER_SITE_OTHER): Payer: Medicaid Other | Admitting: Family Medicine

## 2019-11-12 DIAGNOSIS — J310 Chronic rhinitis: Secondary | ICD-10-CM | POA: Diagnosis not present

## 2019-11-12 DIAGNOSIS — J329 Chronic sinusitis, unspecified: Secondary | ICD-10-CM | POA: Diagnosis not present

## 2019-11-12 MED ORDER — AMOXICILLIN-POT CLAVULANATE 875-125 MG PO TABS: 1.0000 | ORAL_TABLET | Freq: Two times a day (BID) | ORAL | 0 refills | Status: DC

## 2019-11-12 NOTE — Progress Notes (Signed)
Telephone visit  Subjective: [id number] infection PCP: Baruch Gouty, FNP [id number] Patricia Stanton is a 34 y.o. female calls for telephone consult today. Patient provides verbal consent for consult held via phone.  Due to COVID-19 pandemic this visit was conducted virtually. This visit type was conducted due to national recommendations for restrictions regarding the COVID-19 Pandemic (e.g. social distancing, sheltering in place) in an effort to limit this patient's exposure and mitigate transmission in our community. All issues noted in this document were discussed and addressed.  A physical exam was not performed with this format.   Location of patient: home Location of provider: [facility] Others present for call: none  1. Sinusitis Patient reports nasal congestion and nose bleeds when she blows her nose.  She reports sensation of dryness of the nose.  Symptoms have been ongoing for about 1 week.  She was using pseudofed and tylenol.  She has been using the albuterol nebulizer in efforts to relieve nasal congestion.  She used Afrin with no improvement.  She has not done nasal saline rinses.  She used Flonase and it did not help.  She had a fever to 101F.  She has been checked for COVID and it was negative.   ROS: Per HPI  Allergies  Allergen Reactions  . Tramadol Other (See Comments)    Exacerbates her asthma    Past Medical History:  Diagnosis Date  . Anxiety   . Arthritis    left knee  . Asthma   . Chronic abdominal pain   . Chronic headaches   . Depression   . Diabetes mellitus type 2 in obese ([place]) 11/20/2016  . Endometriosis   . HLD (hyperlipidemia) 02/17/2017  . Hypertension   . 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 Mood disorder ([place])   . Obesity   . Ovarian cyst   . Tobacco abuse     Current Outpatient Medications:  .  albuterol (PROVENTIL) (2.5 MG/3ML) 0.083% nebulizer solution, Take 3 mLs (2.5 mg total) by  nebulization every 6 (six) hours as needed for wheezing or shortness of breath., Disp: 150 mL, Rfl: 1 .  albuterol (VENTOLIN HFA) 108 (90 Base) MCG/ACT inhaler, Inhale 2 puffs into the lungs every 6 (six) hours as needed for wheezing or shortness of breath., Disp: 18 g, Rfl: 6 .  amoxicillin-clavulanate (AUGMENTIN) 875-125 MG tablet, Take 1 tablet by mouth 2 (two) times daily., Disp: 14 tablet, Rfl: 0 .  budesonide-formoterol (SYMBICORT) 80-4.5 MCG/ACT inhaler, Inhale 2 puffs into the lungs 2 (two) times daily., Disp: 1 Inhaler, Rfl: 1 .  cetirizine (ZYRTEC) 10 MG tablet, Take 1 tablet (10 mg total) by mouth daily., Disp: 30 tablet, Rfl: 11 .  cyclobenzaprine (FLEXERIL) 5 MG tablet, Take 5 mg by mouth 3 (three) times daily as needed for muscle spasms., Disp: , Rfl:  .  divalproex (DEPAKOTE) 250 MG DR tablet, Take 250-500 mg by mouth See admin instructions. Take 500 mg by mouth in the morning and 250 mg in the afternoon, Disp: , Rfl:  .  DULoxetine (CYMBALTA) 30 MG capsule, Take 30-60 mg by mouth daily. Take 60 mg by mouth in the morning and 30 mg in the evening, Disp: , Rfl:  .  DULoxetine (CYMBALTA) 60 MG capsule, Take 1 capsule (60 mg total) by mouth 2 (two) times daily., Disp: 180 capsule, Rfl: 0 .  empagliflozin (JARDIANCE) 10 MG TABS tablet, Take 10 mg by mouth daily., Disp: 30 tablet, Rfl:  4 .  gabapentin (NEURONTIN) 300 MG capsule, Take 300 mg by mouth 3 (three) times daily. , Disp: , Rfl:  .  lisinopril-hydrochlorothiazide (ZESTORETIC) 10-12.5 MG tablet, Take 1 tablet by mouth daily., Disp: 90 tablet, Rfl: 1 .  LORazepam (ATIVAN) 0.5 MG tablet, Take 1 tablet (0.5 mg total) by mouth daily as needed for anxiety. (Patient taking differently: Take 0.5 mg by mouth at bedtime. ), Disp: 30 tablet, Rfl: 0 .  lovastatin (MEVACOR) 20 MG tablet, Take 20 mg by mouth at bedtime., Disp: , Rfl:  .  metFORMIN (GLUCOPHAGE XR) 500 MG 24 hr tablet, Take 2 tablets (1,000 mg total) by mouth 2 (two) times daily.,  Disp: 360 tablet, Rfl: 1 .  methocarbamol (ROBAXIN) 500 MG tablet, Take 500 mg by mouth 3 (three) times daily as needed for muscle spasms. , Disp: , Rfl:  .  metroNIDAZOLE (FLAGYL) 500 MG tablet, Take 1 tablet (500 mg total) by mouth 2 (two) times daily., Disp: 14 tablet, Rfl: 0 .  montelukast (SINGULAIR) 10 MG tablet, Take 1 tablet (10 mg total) by mouth at bedtime., Disp: 30 tablet, Rfl: 3 .  oxyCODONE-acetaminophen (PERCOCET/ROXICET) 5-325 MG tablet, Take 1 tablet by mouth every 4 (four) hours as needed for moderate pain., Disp: 30 tablet, Rfl: 0 .  pravastatin (PRAVACHOL) 40 MG tablet, Take 1 tablet (40 mg total) by mouth daily., Disp: 90 tablet, Rfl: 3 .  prazosin (MINIPRESS) 1 MG capsule, Take 1 capsule (1 mg total) by mouth at bedtime., Disp: 90 capsule, Rfl: 0  Assessment/ Plan: 34 y.o. female   1. Rhinosinusitis We will empirically treat with oral antibiotics given progression of symptoms and refractory nature to over-the-counter medications.  We discussed use of nasal saline spray in efforts to moisturize nares.  Augmentin 875 p.o. twice daily sent to pharmacy.  Home care instructions were reviewed and reasons for reevaluation discussed.  Follow-up as needed - amoxicillin-clavulanate (AUGMENTIN) 875-125 MG tablet; Take 1 tablet by mouth 2 (two) times daily.  Dispense: 20 tablet; Refill: 0   Start time: 3:31pm End time: 3:37pm  Total time spent on patient care (including telephone call/ virtual visit): 12 minutes  [place], [place] [phone]

## 2019-11-12 NOTE — Patient Instructions (Signed)

## 2019-11-17 ENCOUNTER — Telehealth: Payer: Self-pay | Admitting: Family Medicine

## 2019-11-17 NOTE — Telephone Encounter (Signed)
Patient aware and verbalized understanding. °

## 2019-11-17 NOTE — Telephone Encounter (Signed)
She needs to continue symptomatic care with Mucinex, Flonase, plenty of fluids, and rest. If diarrhea continues for longer than 4 days, please make an appointment to be evaluated.

## 2019-11-17 NOTE — Telephone Encounter (Signed)
Patient is complaining of congestion, nasal drainage, diarrhea,. Denies fever. Patient has been on abx since last Friday. Diarrhea started this morning. Patient doing all inhalers and neb treatments. Please advise.

## 2019-11-25 ENCOUNTER — Ambulatory Visit: Payer: Medicaid Other | Admitting: Family

## 2019-11-25 ENCOUNTER — Encounter: Payer: Self-pay | Admitting: Family

## 2019-11-25 ENCOUNTER — Ambulatory Visit (INDEPENDENT_AMBULATORY_CARE_PROVIDER_SITE_OTHER): Payer: Medicaid Other

## 2019-11-25 ENCOUNTER — Other Ambulatory Visit: Payer: Self-pay

## 2019-11-25 ENCOUNTER — Ambulatory Visit: Payer: Medicaid Other | Admitting: Family Medicine

## 2019-11-25 VITALS — BP 156/103 | HR 92 | Temp 98.4°F | Ht 65.0 in | Wt 303.6 lb

## 2019-11-25 DIAGNOSIS — M25572 Pain in left ankle and joints of left foot: Secondary | ICD-10-CM

## 2019-11-25 DIAGNOSIS — S70311A Abrasion, right thigh, initial encounter: Secondary | ICD-10-CM

## 2019-11-25 DIAGNOSIS — M79645 Pain in left finger(s): Secondary | ICD-10-CM | POA: Diagnosis not present

## 2019-11-25 DIAGNOSIS — S6992XA Unspecified injury of left wrist, hand and finger(s), initial encounter: Secondary | ICD-10-CM | POA: Diagnosis not present

## 2019-11-25 MED ORDER — DICLOFENAC SODIUM 75 MG PO TBEC: 75.0000 mg | DELAYED_RELEASE_TABLET | Freq: Two times a day (BID) | ORAL | 0 refills | Status: DC

## 2019-11-25 NOTE — Patient Instructions (Signed)
Thumb Sprain  A thumb sprain is an injury to one of the bands of tissue (ligaments) that connect the bones in your thumb. The ligament may be stretched too much, or it may be torn. A tear can be either partial or complete. How bad, or severe, the sprain is depends on how much of the ligament was damaged or torn. What are the causes? A thumb sprain is often caused by a fall or an accident, such as when you hold your hands out to catch something or to protect yourself. What increases the risk? This injury is more likely to occur in people who play sports that involve:  A risk of falling, such as skiing.  Catching an object, such as basketball. What are the signs or symptoms? Symptoms of this condition include:  Not being able to move the thumb normally.  Swelling.  Tenderness.  Bruising. How is this diagnosed? This condition may be diagnosed based on:  Your symptoms and medical history. Your health care provider may ask about any recent injuries to your thumb.  A physical exam.  Imaging studies such as X-ray, ultrasound, or MRI. How is this treated? Treatment for this condition depends on how severe your sprain is.  If your ligament is overstretched or partially torn, treatment usually involves keeping your thumb in a fixed position (immobilization) for at least 4 to 6 weeks. Your health care provider will apply a bandage, cast, or splint to keep your thumb from moving until it heals.  If your ligament is fully torn, you may need surgery to reconnect the ligament to the bone. After surgery, you will need to wear a cast or splint on your thumb. Your health care provider may also recommend physical therapy to strengthen your thumb. Follow these instructions at home: If you have a splint or bandage:  Wear the splint or bandage as told by your health care provider. Remove it only as told by your health care provider.  Loosen the splint or bandage if your thumb or fingers tingle,  become numb, or turn cold and blue.  Keep the splint or bandage clean and dry. If you have a cast:  Do not stick anything inside the cast to scratch your skin. Doing that increases your risk of infection.  Check the skin around the cast every day. Tell your health care provider about any concerns.  You may put lotion on dry skin around the edges of the cast. Do not put lotion on the skin underneath the cast.  Keep the cast clean and dry. Bathing  Do not take baths, swim, or use a hot tub until your health care provider approves. Ask your health care provider if you may take showers. You may only be allowed to take sponge baths.  If your splint, bandage, or cast is not waterproof: ? Do not let it get wet. ? Cover it with a watertight covering to protect it from water when you take a bath or shower. Managing pain, stiffness, and swelling   If directed, put ice on your thumb: ? If you have a removable splint, remove it as told by your health care provider. ? Put ice in a plastic bag. ? Place a towel between your skin and the bag, or between your cast and the bag. ? Leave the ice on for 20 minutes, 2-3 times a day.  Move your fingers often to avoid stiffness and to lessen swelling.  Raise (elevate) your hand above the level of your heart  while you are sitting or lying down. Activity  Return to your normal activities as told by your health care provider. Ask your health care provider what activities are safe for you.  Do physical therapy exercises as directed. After your splint or cast is removed, your health care provider may recommend that you: ? Move your thumb in circles. ? Touch your thumb to your pinky finger. ? Do these exercises several times a day.  Ask your health care provider if you may use a hand exerciser to strengthen your muscles.  If your thumb feels stiff while you are exercising it, try doing the exercises while soaking your hand in warm water. Driving  Do  not drive until your health care provider approves.  Donot drive or use heavy machinery while taking prescription pain medicine. General instructions  Do not put pressure on any part of the cast or splint until it is fully hardened, if applicable. This may take several hours.  Take over-the-counter and prescription medicines only as told by your health care provider.  Do not use any products that contain nicotine or tobacco, such as cigarettes and e-cigarettes. These can delay healing. If you need help quitting, ask your health care provider.  Do not wear rings on your injured thumb.  Keep all follow-up visits as told by your health care provider. This is important. Contact a health care provider if you have:  Pain that gets worse or does not get better with medicine.  Bruising or swelling that gets worse.  Your cast or splint is damaged. Get help right away if:  Your thumb feels numb, tingles, turns cold, or turns blue, even after loosening your splint or bandage (if applicable). Summary  A thumb sprain is an injury to one of the bands of tissue (ligaments) that connect the bones in your thumb.  Thumb sprains are more likely to occur in people who play sports that involve a risk of falling or having to catch an object.  Treatment will depend on how severe the sprain is, but it will require keeping the thumb in a fixed position. It might require surgery.  Make sure you understand and follow all of your health care provider's instructions for home care. This information is not intended to replace advice given to you by your health care provider. Make sure you discuss any questions you have with your health care provider. Document Revised: 09/25/2017 Document Reviewed: 09/25/2017 Elsevier Patient Education  2020 [employer]  

## 2019-11-25 NOTE — Progress Notes (Signed)
Subjective:    Patient ID: Patricia Stanton, female    DOB: Apr 21, 1986, 34 y.o.   MRN: [id number]  Chief Complaint  Patient presents with  . Fall    through deck on 03/11. Left hand, left ankle, right leg     HPI PT presents to the office today with left hand and left ankle pain. She reports she was standing on her back porch yesterday and her right leg fell through the deck. When she fell her left ankle twisted and he hit her left thumb and wrist on a corner.   She reports constant aching pain of 10 out 10. She has taken Aleve without relief. She reports she cleaned her right leg and applied neosporin.   TDAP UTD.    Review of Systems  All other systems reviewed and are negative.      Objective:   Physical Exam Vitals reviewed.  Constitutional:      General: She is not in acute distress.    Appearance: She is well-developed. She is obese.  HENT:     Head: Normocephalic and atraumatic.  Eyes:     Pupils: Pupils are equal, round, and reactive to light.  Neck:     Thyroid: No thyromegaly.  Cardiovascular:     Rate and Rhythm: Normal rate and regular rhythm.     Heart sounds: Normal heart sounds. No murmur.  Pulmonary:     Effort: Pulmonary effort is normal. No respiratory distress.     Breath sounds: Normal breath sounds. No wheezing.  Abdominal:     General: Bowel sounds are normal. There is no distension.     Palpations: Abdomen is soft.     Tenderness: There is no abdominal tenderness.  Musculoskeletal:        General: Swelling and tenderness present.     Cervical back: Normal range of motion and neck supple.     Right lower leg: Right lower leg edema: trace swelling in left lateral ankle.     Comments: Left thumb swelling, unable to move thumb at first joint,   Skin:    General: Skin is warm and dry.     Findings: Abrasion present.          Comments: Abrasion on right lateral thigh,   Neurological:     Mental Status: She is alert and oriented to person,  place, and time.     Cranial Nerves: No cranial nerve deficit.     Deep Tendon Reflexes: Reflexes are normal and symmetric.  Psychiatric:        Behavior: Behavior normal.        Thought Content: Thought content normal.        Judgment: Judgment normal.     BP (!) 156/103   Pulse 92   Temp 98.4 F (36.9 C) (Temporal)   Ht 5\' 5"  (1.651 m)   Wt (!) 303 lb 9.6 oz (137.7 kg)   SpO2 95%   BMI 50.52 kg/m      Assessment & Plan:  Patricia Stanton comes in today with chief complaint of Fall (through deck on 03/10. Left hand, left ankle, right leg )   Diagnosis and orders addressed:  1. Abrasion of right thigh, initial encounter - diclofenac (VOLTAREN) 75 MG EC tablet; Take 1 tablet (75 mg total) by mouth 2 (two) times daily.  Dispense: 30 tablet; Refill: 0 - DG Finger Thumb Left; Future  2. Acute left ankle pain - diclofenac (VOLTAREN) 75 MG EC tablet; Take 1  tablet (75 mg total) by mouth 2 (two) times daily.  Dispense: 30 tablet; Refill: 0 - DG Finger Thumb Left; Future  3. Thumb pain, left - diclofenac (VOLTAREN) 75 MG EC tablet; Take 1 tablet (75 mg total) by mouth 2 (two) times daily.  Dispense: 30 tablet; Refill: 0 - DG Finger Thumb Left; Future  Will start diclofenac today No other NSAID's while taking Diclofenac  Rest Ice Compression  Evelina Dun, FNP

## 2019-11-26 ENCOUNTER — Other Ambulatory Visit: Payer: Self-pay | Admitting: Family Medicine

## 2019-11-26 ENCOUNTER — Telehealth: Payer: Self-pay | Admitting: Family Medicine

## 2019-11-26 DIAGNOSIS — M79645 Pain in left finger(s): Secondary | ICD-10-CM

## 2019-11-26 NOTE — Telephone Encounter (Signed)
Patricia Stanton saw patient yesterday and order thumb x-ray - patient is calling to find out results can you please review report in chart and advise what to tell the patient.  Thank you

## 2019-11-26 NOTE — Telephone Encounter (Signed)
Referral to ortho

## 2019-11-26 NOTE — Telephone Encounter (Signed)
Patient notified

## 2019-11-29 ENCOUNTER — Telehealth: Payer: Self-pay | Admitting: Family Medicine

## 2019-11-29 ENCOUNTER — Other Ambulatory Visit: Payer: Self-pay | Admitting: Family Medicine

## 2019-11-29 DIAGNOSIS — M79645 Pain in left finger(s): Secondary | ICD-10-CM

## 2019-11-29 NOTE — Telephone Encounter (Signed)
  REFERRAL REQUEST Telephone Note 11/29/2019  What type of referral do you need? Ortho, she has a fractured thumb and bone that is fractured hurts and can not use her hand she is getting depressed   Have you been seen at our office for this problem? Yes, last week (Advise that they may need an appointment with their PCP before a referral can be done)  Is there a particular doctor or location that you prefer? Dr. Apolonio Schneiders, spouse states he does not care about the cost, if it takes too long to get approved by insurance willing to pay out of pocket in order to get her seen sooner   Patient notified that referrals can take up to a week or longer to process. If they haven't heard anything within a week they should call back and speak with the referral department.

## 2019-11-29 NOTE — Telephone Encounter (Signed)
New referral placed.

## 2019-11-30 DIAGNOSIS — G5600 Carpal tunnel syndrome, unspecified upper limb: Secondary | ICD-10-CM | POA: Insufficient documentation

## 2019-12-01 DIAGNOSIS — S6992XA Unspecified injury of left wrist, hand and finger(s), initial encounter: Secondary | ICD-10-CM | POA: Diagnosis not present

## 2019-12-01 DIAGNOSIS — S62102A Fracture of unspecified carpal bone, left wrist, initial encounter for closed fracture: Secondary | ICD-10-CM | POA: Diagnosis not present

## 2019-12-01 DIAGNOSIS — S62109A Fracture of unspecified carpal bone, unspecified wrist, initial encounter for closed fracture: Secondary | ICD-10-CM | POA: Insufficient documentation

## 2019-12-02 DIAGNOSIS — F331 Major depressive disorder, recurrent, moderate: Secondary | ICD-10-CM

## 2019-12-28 DIAGNOSIS — M545 Low back pain: Secondary | ICD-10-CM | POA: Diagnosis not present

## 2020-01-10 ENCOUNTER — Other Ambulatory Visit (HOSPITAL_COMMUNITY): Payer: Self-pay | Admitting: Neurological Surgery

## 2020-01-10 ENCOUNTER — Other Ambulatory Visit: Payer: Self-pay | Admitting: Neurological Surgery

## 2020-01-10 DIAGNOSIS — M545 Low back pain, unspecified: Secondary | ICD-10-CM
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
## 2020-01-21 ENCOUNTER — Encounter (HOSPITAL_COMMUNITY): Payer: Self-pay | Admitting: Psychiatry

## 2020-01-21 ENCOUNTER — Telehealth (INDEPENDENT_AMBULATORY_CARE_PROVIDER_SITE_OTHER): Payer: Medicaid Other | Admitting: Psychiatry

## 2020-01-21 ENCOUNTER — Other Ambulatory Visit: Payer: Self-pay

## 2020-01-21 DIAGNOSIS — F4310 Post-traumatic stress disorder, unspecified: Secondary | ICD-10-CM

## 2020-01-21 DIAGNOSIS — F331 Major depressive disorder, recurrent, moderate: Secondary | ICD-10-CM | POA: Diagnosis not present

## 2020-01-21 MED ORDER — SERTRALINE HCL 50 MG PO TABS: ORAL_TABLET | ORAL | 1 refills | Status: DC

## 2020-01-21 MED ORDER — PRAZOSIN HCL 1 MG PO CAPS: 1.0000 mg | ORAL_CAPSULE | Freq: Every day | ORAL | 0 refills | Status: DC

## 2020-01-21 NOTE — Patient Instructions (Signed)
1. Decrease duloxetine 60 mg for one week, then discontinue 2. Start sertraline 25 mg daily for one week, then 50 mg daily  3. Continue prazosin 1 mg at night  4.  Next appointment: 7/15 at 9:20

## 2020-02-07 ENCOUNTER — Ambulatory Visit (HOSPITAL_COMMUNITY): Payer: Medicaid Other

## 2020-02-15 ENCOUNTER — Ambulatory Visit (HOSPITAL_COMMUNITY)
Admission: RE | Admit: 2020-02-15 | Discharge: 2020-02-15 | Disposition: A | Discharge status: Home / Self Care | Payer: Medicaid Other | Source: Ambulatory Visit | Attending: Neurological Surgery | Admitting: Neurological Surgery

## 2020-02-15 ENCOUNTER — Other Ambulatory Visit: Payer: Self-pay

## 2020-02-15 DIAGNOSIS — M545 Low back pain, unspecified: Secondary | ICD-10-CM

## 2020-02-15 DIAGNOSIS — R059 Cough, unspecified: Secondary | ICD-10-CM

## 2020-02-15 DIAGNOSIS — M5126 Other intervertebral disc displacement, lumbar region: Secondary | ICD-10-CM | POA: Diagnosis not present

## 2020-02-15 MED ORDER — GADOBUTROL 1 MMOL/ML IV SOLN
10.0000 mL | Freq: Once | INTRAVENOUS | Status: AC | PRN
  Administered 2020-02-15: 10 mL via INTRAVENOUS

## 2020-02-15 MED ORDER — CETIRIZINE HCL 10 MG PO TABS: 10.0000 mg | ORAL_TABLET | Freq: Every day | ORAL | 9 refills | Status: DC

## 2020-02-22 ENCOUNTER — Ambulatory Visit (HOSPITAL_COMMUNITY): Payer: Self-pay

## 2020-02-22 DIAGNOSIS — R030 Elevated blood-pressure reading, without diagnosis of hypertension: Secondary | ICD-10-CM | POA: Diagnosis not present

## 2020-02-22 DIAGNOSIS — Z6841 Body Mass Index (BMI) 40.0 and over, adult: Secondary | ICD-10-CM | POA: Diagnosis not present

## 2020-02-22 DIAGNOSIS — M545 Low back pain: Secondary | ICD-10-CM | POA: Diagnosis not present
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
## 2020-03-30 ENCOUNTER — Telehealth (HOSPITAL_COMMUNITY): Payer: Medicaid Other | Admitting: Psychiatry

## 2020-03-30 ENCOUNTER — Other Ambulatory Visit: Payer: Self-pay
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
## 2020-04-26 ENCOUNTER — Telehealth (INDEPENDENT_AMBULATORY_CARE_PROVIDER_SITE_OTHER): Payer: Medicaid Other | Admitting: Psychiatry

## 2020-04-26 ENCOUNTER — Other Ambulatory Visit: Payer: Self-pay

## 2020-04-26 ENCOUNTER — Encounter (HOSPITAL_COMMUNITY): Payer: Self-pay | Admitting: Psychiatry

## 2020-04-26 DIAGNOSIS — F331 Major depressive disorder, recurrent, moderate: Secondary | ICD-10-CM

## 2020-04-26 DIAGNOSIS — F4310 Post-traumatic stress disorder, unspecified: Secondary | ICD-10-CM | POA: Diagnosis not present

## 2020-04-26 MED ORDER — PRAZOSIN HCL 2 MG PO CAPS: 2.0000 mg | ORAL_CAPSULE | Freq: Every day | ORAL | 0 refills | Status: DC

## 2020-04-26 MED ORDER — ESCITALOPRAM OXALATE 10 MG PO TABS: ORAL_TABLET | ORAL | 1 refills | Status: DC

## 2020-04-26 NOTE — Patient Instructions (Signed)
1. Discontinue duloxetine  2. Start lexapro 5 mg daily for one week, then 10 mg daily  3. Increase prazosin 2 mg at night  4. Next appointment: 10/6 at 9:10

## 2020-05-11 ENCOUNTER — Telehealth: Payer: Self-pay | Admitting: Family

## 2020-05-11 DIAGNOSIS — J410 Simple chronic bronchitis: Secondary | ICD-10-CM

## 2020-05-11 MED ORDER — BUDESONIDE-FORMOTEROL FUMARATE 80-4.5 MCG/ACT IN AERO: 2.0000 | INHALATION_SPRAY | Freq: Two times a day (BID) | RESPIRATORY_TRACT | 3 refills | Status: DC

## 2020-05-11 NOTE — Telephone Encounter (Signed)
°  Prescription Request  05/11/2020  What is the name of the medication or equipment? Budesonide-formoterol 80-4.5 MCG  Have you contacted your pharmacy to request a refill? (if applicable) YES  Which pharmacy would you like this sent to? [facility] in [place]   Patient notified that their request is being sent to the clinical staff for review and that they should receive a response within 2 business days.

## 2020-05-11 NOTE — Telephone Encounter (Signed)
Prescription sent to pharmacy.

## 2020-06-07 ENCOUNTER — Other Ambulatory Visit: Payer: Self-pay | Admitting: Family

## 2020-06-07 ENCOUNTER — Ambulatory Visit (INDEPENDENT_AMBULATORY_CARE_PROVIDER_SITE_OTHER): Payer: Medicaid Other | Admitting: Family

## 2020-06-07 ENCOUNTER — Encounter: Payer: Self-pay | Admitting: Family

## 2020-06-07 DIAGNOSIS — E1122 Type 2 diabetes mellitus with diabetic chronic kidney disease: Secondary | ICD-10-CM

## 2020-06-07 DIAGNOSIS — F4310 Post-traumatic stress disorder, unspecified: Secondary | ICD-10-CM

## 2020-06-07 DIAGNOSIS — K5900 Constipation, unspecified: Secondary | ICD-10-CM

## 2020-06-07 DIAGNOSIS — E1169 Type 2 diabetes mellitus with other specified complication: Secondary | ICD-10-CM

## 2020-06-07 DIAGNOSIS — E785 Hyperlipidemia, unspecified: Secondary | ICD-10-CM

## 2020-06-07 DIAGNOSIS — I129 Hypertensive chronic kidney disease with stage 1 through stage 4 chronic kidney disease, or unspecified chronic kidney disease: Secondary | ICD-10-CM

## 2020-06-07 DIAGNOSIS — Z1159 Encounter for screening for other viral diseases: Secondary | ICD-10-CM

## 2020-06-07 DIAGNOSIS — F17200 Nicotine dependence, unspecified, uncomplicated: Secondary | ICD-10-CM

## 2020-06-07 DIAGNOSIS — F331 Major depressive disorder, recurrent, moderate: Secondary | ICD-10-CM

## 2020-06-07 DIAGNOSIS — E669 Obesity, unspecified: Secondary | ICD-10-CM

## 2020-06-07 DIAGNOSIS — J410 Simple chronic bronchitis: Secondary | ICD-10-CM

## 2020-06-07 DIAGNOSIS — R059 Cough, unspecified: Secondary | ICD-10-CM

## 2020-06-07 DIAGNOSIS — Z9889 Other specified postprocedural states: Secondary | ICD-10-CM

## 2020-06-07 DIAGNOSIS — J449 Chronic obstructive pulmonary disease, unspecified: Secondary | ICD-10-CM

## 2020-06-07 DIAGNOSIS — Z114 Encounter for screening for human immunodeficiency virus [HIV]: Secondary | ICD-10-CM

## 2020-06-07 MED ORDER — ALBUTEROL SULFATE (2.5 MG/3ML) 0.083% IN NEBU: 2.5000 mg | INHALATION_SOLUTION | Freq: Four times a day (QID) | RESPIRATORY_TRACT | 1 refills | Status: DC | PRN

## 2020-06-07 MED ORDER — ALBUTEROL SULFATE HFA 108 (90 BASE) MCG/ACT IN AERS: 2.0000 | INHALATION_SPRAY | Freq: Four times a day (QID) | RESPIRATORY_TRACT | 6 refills | Status: DC | PRN

## 2020-06-07 NOTE — Progress Notes (Signed)
Virtual Visit via telephone Note Due to COVID-19 pandemic this visit was conducted virtually. This visit type was conducted due to national recommendations for restrictions regarding the COVID-19 Pandemic (e.g. social distancing, sheltering in place) in an effort to limit this patient's exposure and mitigate transmission in our community. All issues noted in this document were discussed and addressed.  A physical exam was not performed with this format.  I connected with Patricia Stanton on 06/07/20 at 10:17 AM by telephone and verified that I am speaking with the correct person using two identifiers. Patricia Stanton is currently located at home and no one is currently with her during visit. The provider, Evelina Dun, FNP is located in their office at time of visit.  I discussed the limitations, risks, security and privacy concerns of performing an evaluation and management service by telephone and the availability of in person appointments. I also discussed with the patient that there may be a patient responsible charge related to this service. The patient expressed understanding and agreed to proceed.   History and Present Illness:  Pt calls the office today to establish care with me. She is followed by [facility] every 3 months for PTSD, GAD, and depression. She is followed by Pain Clinic every 3 months for chronic back pain. She is followed by a Neurosurgeon as needed and had lumbar laminectomy surgery on 05/20/2019.  Diabetes She presents for her follow-up diabetic visit. She has type 2 diabetes mellitus. Her disease course has been stable. Hypoglycemia symptoms include headaches. Associated symptoms include foot paresthesias. Pertinent negatives for diabetes include no blurred vision. Symptoms are stable. Diabetic complications include peripheral neuropathy. Pertinent negatives for diabetic complications include no CVA or heart disease. Risk factors for coronary artery disease include  hypertension, post-menopausal, sedentary lifestyle and diabetes mellitus. She is following a generally unhealthy diet. Her overall blood glucose range is 140-180 mg/dl. An ACE inhibitor/angiotensin II receptor blocker is being taken. Eye exam is current.  Hypertension This is a chronic problem. The current episode started more than 1 year ago. The problem has been resolved since onset. The problem is controlled. Associated symptoms include headaches and malaise/fatigue. Pertinent negatives include no blurred vision, peripheral edema or shortness of breath. There is no history of CVA.  Hyperlipidemia This is a chronic problem. The current episode started more than 1 year ago. Factors aggravating her hyperlipidemia include smoking. Pertinent negatives include no shortness of breath. Current antihyperlipidemic treatment includes statins. The current treatment provides moderate improvement of lipids. Risk factors for coronary artery disease include hypertension, a sedentary lifestyle, obesity, dyslipidemia and diabetes mellitus.  Cough This is a new problem. The current episode started in the past 7 days. The problem has been unchanged. The cough is productive of purulent sputum. Associated symptoms include headaches and nasal congestion. Pertinent negatives include no chills, ear congestion, ear pain, fever, postnasal drip, rhinorrhea, shortness of breath or wheezing. The treatment provided mild relief.  COPD PT states she is trying to quit smoking, but continues to smoke 1/2 a pack. Uses Symbicort BID.     Review of Systems  Constitutional: Positive for malaise/fatigue. Negative for chills and fever.  HENT: Negative for ear pain, postnasal drip and rhinorrhea.   Eyes: Negative for blurred vision.  Respiratory: Positive for cough. Negative for shortness of breath and wheezing.   Neurological: Positive for headaches.     Observations/Objective: No SOB or distress noted   Assessment and  Plan: Patricia Stanton comes in today with chief complaint  of No chief complaint on file.   Diagnosis and orders addressed:  1. 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 Diabetes mellitus type 2 in obese (HCC) - Bayer DCA Hb A1c Waived; Future - CMP14+EGFR; Future - CBC with Differential/Platelet; Future - Microalbumin / creatinine urine ratio; Future  3. Hyperlipidemia associated with type 2 diabetes mellitus (HCC) - CMP14+EGFR; Future - CBC with Differential/Platelet; Future  4. Major depressive disorder, recurrent episode, moderate (HCC) - CMP14+EGFR; Future - CBC with Differential/Platelet; Future  5. Morbid obesity (HCC) - CMP14+EGFR; Future - CBC with Differential/Platelet; Future  6. PTSD (post-traumatic stress disorder) - CMP14+EGFR; Future - CBC with Differential/Platelet; Future  7. S/P lumbar laminectomy - CMP14+EGFR; Future - CBC with Differential/Platelet; Future  8. Simple chronic bronchitis (HCC) Smoking cessation discussed - albuterol (VENTOLIN HFA) 108 (90 Base) MCG/ACT inhaler; Inhale 2 puffs into the lungs every 6 (six) hours as needed for wheezing or shortness of breath.  Dispense: 18 g; Refill: 6 - CMP14+EGFR; Future - CBC with Differential/Platelet; Future - albuterol (PROVENTIL) (2.5 MG/3ML) 0.083% nebulizer solution; Take 3 mLs (2.5 mg total) by nebulization every 6 (six) hours as needed for wheezing or shortness of breath.  Dispense: 150 mL; Refill: 1  9. Chronic obstructive pulmonary disease, unspecified COPD type (HCC) - CMP14+EGFR; Future - CBC with Differential/Platelet; Future  10. Current smoker - CMP14+EGFR; Future - CBC with Differential/Platelet; Future  11. Constipation, unspecified constipation type - CMP14+EGFR; Future - CBC with Differential/Platelet; Future  12. Encounter for screening for HIV - CMP14+EGFR;  Future - CBC with Differential/Platelet; Future - HIV Antibody (routine testing w rflx); Future  13. Encounter for hepatitis C screening test for low risk patient - CMP14+EGFR; Future - CBC with Differential/Platelet; Future - Hepatitis C antibody; Future   Labs pending Health Maintenance reviewed Diet and exercise encouraged   Follow Up Instructions: 3 months     I discussed the assessment and treatment plan with the patient. The patient was provided an opportunity to ask questions and all were answered. The patient agreed with the plan and demonstrated an understanding of the instructions.   The patient was advised to call back or seek an in-person evaluation if the symptoms worsen or if the condition fails to improve as anticipated.  The above assessment and management plan was discussed with the patient. The patient verbalized understanding of and has agreed to the management plan. Patient is aware to call the clinic if symptoms persist or worsen. Patient is aware when to return to the clinic for a follow-up visit. Patient educated on when it is appropriate to go to the emergency department.   Time call ended: 10:37 AM   I provided 20 minutes of non-face-to-face time during this encounter.    Christy Hawks, FNP   

## 2020-06-07 NOTE — Telephone Encounter (Signed)
Pt had televisit with Patricia Stanton today and says that none of her med refills have not been sent to pharmacy yet. Use [facility] in [place].

## 2020-06-08 ENCOUNTER — Telehealth: Payer: Self-pay | Admitting: Family

## 2020-06-08 MED ORDER — LISINOPRIL-HYDROCHLOROTHIAZIDE 10-12.5 MG PO TABS: 1.0000 | ORAL_TABLET | Freq: Every day | ORAL | 1 refills | Status: DC

## 2020-06-08 MED ORDER — CETIRIZINE HCL 10 MG PO TABS: 10.0000 mg | ORAL_TABLET | Freq: Every day | ORAL | 1 refills | Status: DC

## 2020-06-08 MED ORDER — LOVASTATIN 20 MG PO TABS: 20.0000 mg | ORAL_TABLET | Freq: Every day | ORAL | 1 refills | Status: DC

## 2020-06-08 MED ORDER — METFORMIN HCL ER 500 MG PO TB24: 1000.0000 mg | ORAL_TABLET | Freq: Two times a day (BID) | ORAL | 1 refills | Status: DC

## 2020-06-09 MED ORDER — AZITHROMYCIN 250 MG PO TABS: ORAL_TABLET | ORAL | 0 refills | Status: DC

## 2020-06-09 NOTE — Telephone Encounter (Signed)
Zpak Prescription sent to pharmacy   

## 2020-06-09 NOTE — Telephone Encounter (Signed)
Pt aware rx sent into pharmacy. 

## 2020-06-13 ENCOUNTER — Telehealth: Payer: Self-pay | Admitting: Family

## 2020-06-13 NOTE — Telephone Encounter (Signed)
This continues to work even after completing up to 10 days. Continue with mucinex, tylenol, forcing fluids.

## 2020-06-13 NOTE — Telephone Encounter (Signed)
Patient aware and verbalizes understanding. 

## 2020-06-14 ENCOUNTER — Telehealth: Payer: Self-pay | Admitting: Family

## 2020-06-14 NOTE — Telephone Encounter (Signed)
Pt is complaining of pain and vomiting diarrhea. I told spouse that we did not have no apt available today or tomorrow. He said this is something that cannot wait. I told him to take her to urgent care or er. Pt in the background said that [place] told her to call if she got worse. Please call back

## 2020-06-14 NOTE — Telephone Encounter (Signed)
Not sure what this patient is talking about. I have not seen her since earlier this year.  Her most recent appt was with her PCP.  Agree with your recommendations.

## 2020-06-14 NOTE — Telephone Encounter (Signed)
Correction Kayren Eaves nurse

## 2020-06-14 NOTE — Telephone Encounter (Signed)
There are absolutely no available appts here!

## 2020-06-15 ENCOUNTER — Other Ambulatory Visit: Payer: Self-pay

## 2020-06-15 ENCOUNTER — Encounter: Payer: Self-pay | Admitting: Emergency Medicine

## 2020-06-15 ENCOUNTER — Ambulatory Visit
Admission: EM | Admit: 2020-06-15 | Discharge: 2020-06-15 | Disposition: A | Discharge status: Home / Self Care | Payer: Medicaid Other | Attending: Emergency Medicine | Admitting: Emergency Medicine

## 2020-06-15 DIAGNOSIS — Z1152 Encounter for screening for COVID-19: Secondary | ICD-10-CM

## 2020-06-15 DIAGNOSIS — R059 Cough, unspecified: Secondary | ICD-10-CM

## 2020-06-15 DIAGNOSIS — J4541 Moderate persistent asthma with (acute) exacerbation: Secondary | ICD-10-CM

## 2020-06-15 DIAGNOSIS — R05 Cough: Secondary | ICD-10-CM | POA: Diagnosis not present

## 2020-06-15 MED ORDER — PREDNISONE 10 MG (21) PO TBPK: ORAL_TABLET | ORAL | 0 refills | Status: DC

## 2020-06-15 MED ORDER — BENZONATATE 100 MG PO CAPS: 100.0000 mg | ORAL_CAPSULE | Freq: Three times a day (TID) | ORAL | 0 refills | Status: DC

## 2020-06-15 NOTE — Telephone Encounter (Signed)
Spoke with husband, patient is at [facility] in [place] now

## 2020-06-15 NOTE — Discharge Instructions (Addendum)
COVID testing ordered.  It will take between 2-7 days for test results.  Someone will contact you regarding abnormal results.    In the meantime: You should remain isolated in your home for 10 days from symptom onset AND greater than 24  hours after symptoms resolution (absence of fever without the use of fever-reducing medication and improvement in respiratory symptoms), whichever is longer Get plenty of rest and push fluids Tessalon Perles prescribed for cough Prednisone was prescribed for wheezing  Continue to take albuterol and Symbicort as prescribed use medications daily for symptom relief Use OTC medications like ibuprofen or tylenol as needed fever or pain Call or go to the ED if you have any new or worsening symptoms such as fever, worsening cough, shortness of breath, chest tightness, chest pain, turning blue, changes in mental status, etc..Marland Kitchen

## 2020-06-15 NOTE — ED Provider Notes (Addendum)
[place]   [id number] 06/15/20 Arrival Time: 0830   CC: Asthma flareup  SUBJECTIVE: History from: patient.  Patricia Stanton is a 34 y.o. female reviews to review has been presented to the urgent care with a complaint of cough congestion with clear white nasal discharge for the past 1 week.  Has seen PCP via telehealth and was prescribed azithromycin with no symptom improvement.  Denies sick exposure to COVID, flu or strep.  Denies recent travel.  Has tried OTC medication without relief.  Denies aggravating factors.  Denies previous symptoms in the past.   Denies fever, chills, fatigue, sinus pain, rhinorrhea, sore throat, SOB, wheezing, chest pain, nausea, changes in bowel or bladder habits.     ROS: As per HPI.  All other pertinent ROS negative.     Past Medical History:  Diagnosis Date  . Anxiety   . Arthritis    left knee  . Asthma   . Chronic abdominal pain   . Chronic headaches   . Depression   . Diabetes mellitus type 2 in obese ([place]) 11/20/2016  . Endometriosis   . HLD (hyperlipidemia) 02/17/2017  . Hypertension   . 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 Mood disorder ([place])   . Obesity   . Ovarian cyst   . Tobacco abuse    Past Surgical History:  Procedure Laterality Date  . CESAREAN SECTION  2008   [place]  . CHOLECYSTECTOMY N/A 11/25/2014   Procedure: LAPAROSCOPIC CHOLECYSTECTOMY;  Surgeon: Aviva Signs Md, MD;  Location: [facility];  Service: General;  Laterality: N/A;  . ESOPHAGOGASTRODUODENOSCOPY N/A 12/21/2015   Dr. Gala Romney: normal esophagus, non-bleeding erosive gastropathy, normal second portion of duodenum. Reactive gastropathy/chemical gastritis, negative H.pylori  . FEMUR FRACTURE SURGERY Left 1995   tree fell on her  . HERNIA REPAIR    . INCISIONAL HERNIA REPAIR N/A 01/29/2016   Procedure: Fatima Blank HERNIORRHAPHY WITH MESH;  Surgeon: Aviva Signs, MD;  Location: [facility];  Service: General;   Laterality: N/A;  . INSERTION OF MESH  01/29/2016   Procedure: INSERTION OF MESH;  Surgeon: Aviva Signs, MD;  Location: [facility];  Service: General;;  . KNEE ARTHROSCOPY Left   . LUMBAR LAMINECTOMY/DECOMPRESSION MICRODISCECTOMY Right 05/21/2019   Procedure: Microdiscectomy - Lumbar four-Lumbar five - right;  Surgeon: Eustace Moore, MD;  Location: [facility];  Service: Neurosurgery;  Laterality: Right;  . TUBAL LIGATION     Allergies  Allergen Reactions  . Tramadol Other (See Comments)    Exacerbates her asthma    No current facility-administered medications on file prior to encounter.   Current Outpatient Medications on File Prior to Encounter  Medication Sig Dispense Refill  . albuterol (PROVENTIL) (2.5 MG/3ML) 0.083% nebulizer solution Take 3 mLs (2.5 mg total) by nebulization every 6 (six) hours as needed for wheezing or shortness of breath. 150 mL 1  . albuterol (VENTOLIN HFA) 108 (90 Base) MCG/ACT inhaler Inhale 2 puffs into the lungs every 6 (six) hours as needed for wheezing or shortness of breath. 18 g 6  . azithromycin (ZITHROMAX) 250 MG tablet Take 500 mg once, then 250 mg for four days 6 tablet 0  . budesonide-formoterol (SYMBICORT) 80-4.5 MCG/ACT inhaler Inhale 2 puffs into the lungs 2 (two) times daily. 1 each 3  . cetirizine (ZYRTEC) 10 MG tablet Take 1 tablet (10 mg total) by mouth daily. 90 tablet 1  . escitalopram (LEXAPRO) 10 MG tablet 5 mg daily for  one week, then 10 mg daily 30 tablet 1  . gabapentin (NEURONTIN) 300 MG capsule Take 300 mg by mouth 3 (three) times daily.     Marland Kitchen lisinopril-hydrochlorothiazide (ZESTORETIC) 10-12.5 MG tablet Take 1 tablet by mouth daily. 90 tablet 1  . lovastatin (MEVACOR) 20 MG tablet Take 1 tablet (20 mg total) by mouth at bedtime. 90 tablet 1  . metFORMIN (GLUCOPHAGE XR) 500 MG 24 hr tablet Take 2 tablets (1,000 mg total) by mouth 2 (two) times daily. 360 tablet 1  . methocarbamol (ROBAXIN) 500 MG tablet Take 500 mg by mouth 3 (three) times daily  as needed for muscle spasms.     Marland Kitchen oxyCODONE-acetaminophen (PERCOCET/ROXICET) 5-325 MG tablet Take 1 tablet by mouth every 4 (four) hours as needed for moderate pain. 30 tablet 0  . prazosin (MINIPRESS) 2 MG capsule Take 1 capsule (2 mg total) by mouth at bedtime. 90 capsule 0   Social History   Socioeconomic History  . Marital status: Divorced    Spouse name: Not on file  . Number of children: 1  . Years of education: 74  . Highest education level: Not on file  Occupational History  . Occupation: disability  Tobacco Use  . Smoking status: Current Every Day Smoker    Packs/day: 0.50    Years: 11.00    Pack years: 5.50    Types: Cigarettes    Start date: 09/17/2003  . Smokeless tobacco: Never Used  Vaping Use  . Vaping Use: Never used  Substance and Sexual Activity  . Alcohol use: No  . Drug use: No  . Sexual activity: Yes    Birth control/protection: Surgical    Comment: BTL  Other Topics Concern  . Not on file  Social History Narrative   Disabled from nursing   Lives at home with daughter [name]   Social Determinants of Health   Financial Resource Strain:   . Difficulty of Paying Living Expenses: Not on file  Food Insecurity:   . Worried About Charity fundraiser in the Last Year: Not on file  . Ran Out of Food in the Last Year: Not on file  Transportation Needs:   . Lack of Transportation (Medical): Not on file  . Lack of Transportation (Non-Medical): Not on file  Physical Activity:   . Days of Exercise per Week: Not on file  . Minutes of Exercise per Session: Not on file  Stress:   . Feeling of Stress : Not on file  Social Connections:   . Frequency of Communication with Friends and Family: Not on file  . Frequency of Social Gatherings with Friends and Family: Not on file  . Attends Religious Services: Not on file  . Active Member of Clubs or Organizations: Not on file  . Attends Archivist Meetings: Not on file  . Marital Status: Not on file    Intimate Partner Violence:   . Fear of Current or Ex-Partner: Not on file  . Emotionally Abused: Not on file  . Physically Abused: Not on file  . Sexually Abused: Not on file   Family History  Problem Relation Age of Onset  . Hypertension Mother   . Hyperlipidemia Mother   . Fibromyalgia Mother   . Other Mother        degenerative disc disease  . Arthritis Mother   . Kidney disease Mother   . Diabetes Father   . Hypertension Father   . Hyperlipidemia Father   . Heart disease  Father 79  . Heart attack Father   . Stroke Father   . Hyperlipidemia Brother   . Hypertension Brother   . Aneurysm Maternal Grandmother        AAA  . Kidney disease Maternal Grandmother   . Kidney disease Maternal Grandfather   . Aneurysm Paternal Grandmother        AAA  . Kidney disease Paternal Grandmother   . Kidney disease Paternal Grandfather   . Colon cancer Neg Hx   . Inflammatory bowel disease Neg Hx     OBJECTIVE:  Vitals:   06/15/20 0857 06/15/20 0858  BP: (!) 192/122   Pulse: (!) 122   Resp: (!) 21   Temp: 99 F (37.2 C)   TempSrc: Oral   SpO2: 93%   Weight:  297 lb (134.7 kg)  Height:  5\' 5"  (1.651 m)     General appearance: alert; appears fatigued, but nontoxic; speaking in full sentences and tolerating own secretions HEENT: NCAT; Ears: EACs clear, TMs pearly gray; Eyes: PERRL.  EOM grossly intact. Sinuses: nontender; Nose: nares patent without rhinorrhea, Throat: oropharynx clear, tonsils non erythematous or enlarged, uvula midline  Neck: supple without LAD Lungs: unlabored respirations, symmetrical air entry with bilateral wheezing; cough: moderate; no respiratory distress; CTAB Heart: regular rate and rhythm.  Radial pulses 2+ symmetrical bilaterally Skin: warm and dry Psychological: alert and cooperative; normal mood and affect  LABS:  No results found for this or any previous visit (from the past 24 hour(s)).   ASSESSMENT & PLAN:  1. Cough   2. Encounter for  screening for COVID-19   3. Moderate persistent asthma with acute exacerbation     Meds ordered this encounter  Medications  . benzonatate (TESSALON) 100 MG capsule    Sig: Take 1 capsule (100 mg total) by mouth every 8 (eight) hours.    Dispense:  30 capsule    Refill:  0  . predniSONE (STERAPRED UNI-PAK 21 TAB) 10 MG (21) TBPK tablet    Sig: Take 6 tabs by mouth daily  for 1 days, then 5 tabs for1 days, then 4 tabs for 1 days, then 3 tabs for 1 days, 2 tabs for 1 days, then 1 tab by mouth daily for 1 days    Dispense:  21 tablet    Refill:  0    COVID testing ordered.  It will take between 2-7 days for test results.  Someone will contact you regarding abnormal results.    In the meantime: You should remain isolated in your home for 10 days from symptom onset AND greater than 24  hours after symptoms resolution (absence of fever without the use of fever-reducing medication and improvement in respiratory symptoms), whichever is longer Get plenty of rest and push fluids Tessalon Perles prescribed for cough Prednisone was prescribed for wheezing  Continue to take albuterol and Symbicort as prescribed use medications daily for symptom relief Use OTC medications like ibuprofen or tylenol as needed fever or pain Call or go to the ED if you have any new or worsening symptoms such as fever, worsening cough, shortness of breath, chest tightness, chest pain, turning blue, changes in mental status, etc...   Reviewed expectations re: course of current medical issues. Questions answered. Outlined signs and symptoms indicating need for more acute intervention. Patient verbalized understanding. After Visit Summary given.         Emerson Monte, FNP 06/15/20 0958    Emerson Monte, FNP 06/15/20 1000

## 2020-06-15 NOTE — ED Triage Notes (Signed)
About a week ago pt started having sinus pressure and drainage. Pt pcp gave pt z pack x 1 week ago.  State she is not any better, her symptoms have gotten worse.  Now she is has cough and chest congestion.  Pt states she needs cipro because that is usually what helps her get over this.

## 2020-06-16 LAB — NOVEL CORONAVIRUS, NAA: SARS-CoV-2, NAA: NOT DETECTED

## 2020-06-16 LAB — SARS-COV-2, NAA 2 DAY TAT
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
## 2020-06-21 ENCOUNTER — Other Ambulatory Visit: Payer: Self-pay

## 2020-06-21 ENCOUNTER — Encounter (HOSPITAL_COMMUNITY): Payer: Self-pay | Admitting: Psychiatry

## 2020-06-21 ENCOUNTER — Telehealth (INDEPENDENT_AMBULATORY_CARE_PROVIDER_SITE_OTHER): Payer: Medicaid Other | Admitting: Psychiatry

## 2020-06-21 DIAGNOSIS — F4310 Post-traumatic stress disorder, unspecified: Secondary | ICD-10-CM | POA: Diagnosis not present

## 2020-06-21 DIAGNOSIS — F330 Major depressive disorder, recurrent, mild: Secondary | ICD-10-CM

## 2020-06-21 MED ORDER — ESCITALOPRAM OXALATE 10 MG PO TABS
10.0000 mg | ORAL_TABLET | Freq: Every day | ORAL | 0 refills | Status: DC
Start: 2020-06-21 — End: 2020-09-18

## 2020-06-21 MED ORDER — PRAZOSIN HCL 1 MG PO CAPS: ORAL_CAPSULE | ORAL | 0 refills | Status: DC

## 2020-06-21 MED ORDER — PRAZOSIN HCL 2 MG PO CAPS
2.0000 mg | ORAL_CAPSULE | Freq: Every day | ORAL | 0 refills | Status: DC
Start: 2020-07-26 — End: 2020-09-18

## 2020-06-21 NOTE — Patient Instructions (Signed)
1. Continue lexapro 10 mg daily  2. Increase prazosin 3 mg at night  3. Next appointment: 12/22 at 9:10

## 2020-06-23 ENCOUNTER — Telehealth: Payer: Self-pay | Admitting: Family Medicine

## 2020-06-23 DIAGNOSIS — M5416 Radiculopathy, lumbar region: Secondary | ICD-10-CM | POA: Diagnosis not present

## 2020-06-23 NOTE — Telephone Encounter (Signed)
Pt returned missed call from [facility] regarding message from nurse about coming in to do lab work for A1C ck. Pt said she has been sick which is why she has not been in to have labs done but is feeling some better and will try to come in one day next week to do labs.

## 2020-06-26 ENCOUNTER — Other Ambulatory Visit: Payer: Self-pay | Admitting: Family Medicine

## 2020-06-26 DIAGNOSIS — E1169 Type 2 diabetes mellitus with other specified complication: Secondary | ICD-10-CM

## 2020-08-23 ENCOUNTER — Other Ambulatory Visit: Payer: Self-pay | Admitting: Physical Medicine and Rehabilitation

## 2020-08-23 ENCOUNTER — Other Ambulatory Visit (HOSPITAL_COMMUNITY): Payer: Self-pay | Admitting: Physical Medicine and Rehabilitation

## 2020-08-23 DIAGNOSIS — M961 Postlaminectomy syndrome, not elsewhere classified: Secondary | ICD-10-CM
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
## 2020-09-06 ENCOUNTER — Telehealth: Payer: Medicaid Other | Admitting: Psychiatry

## 2020-09-06 ENCOUNTER — Telehealth (HOSPITAL_COMMUNITY): Payer: Medicaid Other | Admitting: Psychiatry

## 2020-09-06 ENCOUNTER — Ambulatory Visit (HOSPITAL_COMMUNITY): Payer: Medicaid Other
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
## 2020-09-18 ENCOUNTER — Encounter: Payer: Self-pay | Admitting: Psychiatry

## 2020-09-18 ENCOUNTER — Telehealth (INDEPENDENT_AMBULATORY_CARE_PROVIDER_SITE_OTHER): Payer: Medicaid Other | Admitting: Psychiatry

## 2020-09-18 ENCOUNTER — Other Ambulatory Visit: Payer: Self-pay

## 2020-09-18 DIAGNOSIS — F330 Major depressive disorder, recurrent, mild: Secondary | ICD-10-CM | POA: Diagnosis not present

## 2020-09-18 DIAGNOSIS — F4310 Post-traumatic stress disorder, unspecified: Secondary | ICD-10-CM

## 2020-09-18 MED ORDER — PRAZOSIN HCL 2 MG PO CAPS
2.0000 mg | ORAL_CAPSULE | Freq: Every day | ORAL | 0 refills | Status: DC
Start: 2020-10-24 — End: 2021-01-24

## 2020-09-18 MED ORDER — PRAZOSIN HCL 1 MG PO CAPS
ORAL_CAPSULE | ORAL | 0 refills | Status: DC
Start: 2020-09-18 — End: 2021-01-04

## 2020-09-18 MED ORDER — ESCITALOPRAM OXALATE 10 MG PO TABS
10.0000 mg | ORAL_TABLET | Freq: Every day | ORAL | 0 refills | Status: DC
Start: 2020-09-18 — End: 2021-01-24

## 2020-09-18 NOTE — Patient Instructions (Signed)
1. Continue lexapro 10 mg daily  2. Increase prazosin 3 mg at night  3.Next appointment: 3/28 at 8:40

## 2020-09-19 ENCOUNTER — Other Ambulatory Visit: Payer: Self-pay

## 2020-09-19 ENCOUNTER — Ambulatory Visit (HOSPITAL_COMMUNITY)
Admission: RE | Admit: 2020-09-19 | Discharge: 2020-09-19 | Disposition: A | Discharge status: Home / Self Care | Payer: Medicaid Other | Source: Ambulatory Visit | Attending: Physical Medicine and Rehabilitation | Admitting: Physical Medicine and Rehabilitation

## 2020-09-19 DIAGNOSIS — M545 Low back pain, unspecified: Secondary | ICD-10-CM | POA: Diagnosis not present

## 2020-09-19 DIAGNOSIS — M961 Postlaminectomy syndrome, not elsewhere classified: Secondary | ICD-10-CM | POA: Insufficient documentation

## 2020-09-19 MED ORDER — GADOBUTROL 1 MMOL/ML IV SOLN
10.0000 mL | Freq: Once | INTRAVENOUS | Status: AC | PRN
  Administered 2020-09-19: 10 mL via INTRAVENOUS

## 2020-09-28 DIAGNOSIS — Z6841 Body Mass Index (BMI) 40.0 and over, adult: Secondary | ICD-10-CM | POA: Diagnosis not present

## 2020-09-28 DIAGNOSIS — M5416 Radiculopathy, lumbar region: Secondary | ICD-10-CM | POA: Diagnosis not present

## 2020-09-28 DIAGNOSIS — R030 Elevated blood-pressure reading, without diagnosis of hypertension: Secondary | ICD-10-CM | POA: Diagnosis not present

## 2020-11-08 NOTE — Progress Notes (Signed)
Primary Care Physician:  Sharion Balloon, FNP Primary Gastroenterologist:  Dr. Gala Romney  Chief Complaint  Patient presents with  . Abdominal Pain    Pain where she had hernia surgery in 2017. Pain and vomiting started last week and has got worse. Feels like she's being stabbed in gut  . Vomiting    HPI:   Patricia Stanton is a 35 y.o. female  with a history of abdominal pain, constipation, GERD.  EGD April 2017 normal esophagus, non-bleeding erosive gastropathy (Negative H.pylori).  Ventral hernia repair with mesh in May 2017. History of cholecystectomy in 2016. Last seen in our office 09/19/2017.    Today: Acute onset sharp, stabbing upper abdominal pain with nausea, vomiting, indigestion, and weight loss. In addition to sharp, stabbing, patient describes pain as a tight belt across her upper abdomen that will tighten and release, but never resolved. Worsened with eating and movement. If eating or drinking anything, she gets nauseated and vomits. Vomiting at least once every day. She is able to keep her medications down. Currently, pain is about 9 out of 10 in severity. She is not on a PPI for acid reflux. States she had not been having any trouble at all with acid reflux, upper abdominal pain, nausea, or vomiting prior to this acute onset. Not sure what triggered this episode. Pain also wakes her from sleep.  Yesterday she had water and soup, but vomited some of this back up. Has not eaten today. Pain is currently 9/10 in severity. Denies hematemesis. Denies fever but reports having cold chills today.    No NSAIDs.   Chronic constipation. Will go 2-3 days without a BM. After she passes hard stool, she will have diarrhea. Had a BM yesterday. No BM this morning. Passnig gas today. Not currently taking anything for constipation. Prior trial of Linzess 290 mcg caused explosive diarrhea. No BRBPR or melena.  Takes oxycodone BID for chronic back pain. This is not helping her current abdominal pain..    Weighted 306 lbs on Sunday. 297 lbs today.   Occasional lightheadedness with position changes. This is chronic. No worsening. No syncope.  Past Medical History:  Diagnosis Date  . Anxiety   . Arthritis    left knee  . Asthma   . Chronic abdominal pain   . Chronic headaches   . Depression   . Diabetes mellitus type 2 in obese ([place]) 11/20/2016  . Endometriosis   . Gastroesophageal reflux disease 11/09/2020  . HLD (hyperlipidemia) 02/17/2017  . Hypertension   . 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 Mood disorder ([place])   . Obesity   . Ovarian cyst   . Tobacco abuse     Past Surgical History:  Procedure Laterality Date  . CESAREAN SECTION  2008   [place]  . CHOLECYSTECTOMY N/A 11/25/2014   Procedure: LAPAROSCOPIC CHOLECYSTECTOMY;  Surgeon: Aviva Signs Md, MD;  Location: [facility];  Service: General;  Laterality: N/A;  . ESOPHAGOGASTRODUODENOSCOPY N/A 12/21/2015   Dr. Gala Romney: normal esophagus, non-bleeding erosive gastropathy, normal second portion of duodenum. Reactive gastropathy/chemical gastritis, negative H.pylori  . FEMUR FRACTURE SURGERY Left 1995   tree fell on her  . HERNIA REPAIR    . INCISIONAL HERNIA REPAIR N/A 01/29/2016   Procedure: Fatima Blank HERNIORRHAPHY WITH MESH;  Surgeon: Aviva Signs, MD;  Location: [facility];  Service: General;  Laterality: N/A;  . INSERTION OF MESH  01/29/2016   Procedure: INSERTION OF MESH;  Surgeon:  Aviva Signs, MD;  Location: [facility];  Service: General;;  . KNEE ARTHROSCOPY Left   . LUMBAR LAMINECTOMY/DECOMPRESSION MICRODISCECTOMY Right 05/21/2019   Procedure: Microdiscectomy - Lumbar four-Lumbar five - right;  Surgeon: Eustace Moore, MD;  Location: [place];  Service: Neurosurgery;  Laterality: Right;  . TUBAL LIGATION      Current Outpatient Medications  Medication Sig Dispense Refill  . albuterol (PROVENTIL) (2.5 MG/3ML) 0.083% nebulizer solution Take 3 mLs (2.5 mg total) by  nebulization every 6 (six) hours as needed for wheezing or shortness of breath. 150 mL 1  . albuterol (VENTOLIN HFA) 108 (90 Base) MCG/ACT inhaler Inhale 2 puffs into the lungs every 6 (six) hours as needed for wheezing or shortness of breath. 18 g 6  . budesonide-formoterol (SYMBICORT) 80-4.5 MCG/ACT inhaler Inhale 2 puffs into the lungs 2 (two) times daily. 1 each 3  . cetirizine (ZYRTEC) 10 MG tablet Take 1 tablet (10 mg total) by mouth daily. 90 tablet 1  . escitalopram (LEXAPRO) 10 MG tablet Take 1 tablet (10 mg total) by mouth daily. 90 tablet 0  . gabapentin (NEURONTIN) 600 MG tablet Take 600 mg by mouth 3 (three) times daily.    Marland Kitchen lisinopril-hydrochlorothiazide (ZESTORETIC) 10-12.5 MG tablet Take 1 tablet by mouth daily. 90 tablet 1  . lovastatin (MEVACOR) 20 MG tablet Take 1 tablet (20 mg total) by mouth at bedtime. 90 tablet 1  . metFORMIN (GLUCOPHAGE XR) 500 MG 24 hr tablet Take 2 tablets (1,000 mg total) by mouth 2 (two) times daily. 360 tablet 1  . methocarbamol (ROBAXIN) 500 MG tablet Take 500 mg by mouth 3 (three) times daily as needed for muscle spasms.     . ondansetron (ZOFRAN) 4 MG tablet Take 1 tablet (4 mg total) by mouth every 8 (eight) hours as needed for nausea or vomiting. 20 tablet 0  . oxyCODONE-acetaminophen (PERCOCET/ROXICET) 5-325 MG tablet Take 1 tablet by mouth every 4 (four) hours as needed for moderate pain. 30 tablet 0  . pantoprazole (PROTONIX) 40 MG tablet Take 1 tablet (40 mg total) by mouth 2 (two) times daily. 60 tablet 3  . prazosin (MINIPRESS) 1 MG capsule Total of 3 mg at night. Take along with 2 mg tab 90 capsule 0  . prazosin (MINIPRESS) 2 MG capsule Take 1 capsule (2 mg total) by mouth at bedtime. 90 capsule 0   No current facility-administered medications for this visit.    Allergies as of 11/09/2020 - Review Complete 11/09/2020  Allergen Reaction Noted  . Tramadol Other (See Comments) 05/21/2019    Family History  Problem Relation Age of Onset   . Hypertension Mother   . Hyperlipidemia Mother   . Fibromyalgia Mother   . Other Mother        degenerative disc disease  . Arthritis Mother   . Kidney disease Mother   . Diabetes Father   . Hypertension Father   . Hyperlipidemia Father   . Heart disease Father 22  . Heart attack Father   . Stroke Father   . Hyperlipidemia Brother   . Hypertension Brother   . Aneurysm Maternal Grandmother        AAA  . Kidney disease Maternal Grandmother   . Kidney disease Maternal Grandfather   . Aneurysm Paternal Grandmother        AAA  . Kidney disease Paternal Grandmother   . Kidney disease Paternal Grandfather   . Colon cancer Neg Hx   . Inflammatory bowel disease Neg Hx  Social History   Socioeconomic History  . Marital status: Divorced    Spouse name: Not on file  . Number of children: 1  . Years of education: 34  . Highest education level: Not on file  Occupational History  . Occupation: disability  Tobacco Use  . Smoking status: Current Every Day Smoker    Packs/day: 0.50    Years: 11.00    Pack years: 5.50    Types: Cigarettes    Start date: 09/17/2003  . Smokeless tobacco: Never Used  Vaping Use  . Vaping Use: Never used  Substance and Sexual Activity  . Alcohol use: No  . Drug use: No  . Sexual activity: Yes    Birth control/protection: Surgical    Comment: BTL  Other Topics Concern  . Not on file  Social History Narrative   Disabled from nursing   Lives at home with daughter Patricia Stanton   Social Determinants of Health   Financial Resource Strain: Not on file  Food Insecurity: Not on file  Transportation Needs: Not on file  Physical Activity: Not on file  Stress: Not on file  Social Connections: Not on file  Intimate Partner Violence: Not on file    Review of Systems: Gen: See HPI CV: Denies chest pain or heart palpitations. Resp: Denies shortness of breath or cough. GI: See HPI GU : Denies urinary burning, urinary frequency, urinary  hesitancy MS: Chronic back pain. Heme: See HPI  Physical Exam: BP (!) 176/93   Pulse (!) 107   Temp (!) 96.6 F (35.9 C) (Temporal)   LMP 09/18/2020 (Approximate)  General:  Alert and oriented. Pleasant and cooperative. Well-nourished and well-developed. No acute distress. Head:  Normocephalic and atraumatic. Eyes:  Without icterus, sclera clear and conjunctiva pink.  Ears:  Normal auditory acuity. Lungs:  Clear to auscultation bilaterally. No wheezes, rales, or rhonchi. No distress.  Heart:  S1, S2 present without murmurs appreciated.  Abdomen: Obese, +BS, soft, and non-distended. Moderate tenderness to palpation across the upper abdomen and in LLQ. No HSM noted. No guarding or rebound. No masses appreciated.  Rectal:  Deferred  Msk:  Symmetrical without gross deformities. Normal posture. Extremities:  Without edema. Neurologic:  Alert and  oriented x4;  grossly normal neurologically. Skin:  Intact without significant lesions or rashes. Psych: Normal mood and affect.

## 2020-11-08 NOTE — H&P (View-Only) (Signed)
Primary Care Physician:  Sharion Balloon, FNP Primary Gastroenterologist:  Dr. Gala Romney  Chief Complaint  Patient presents with  . Abdominal Pain    Pain where she had hernia surgery in 2017. Pain and vomiting started last week and has got worse. Feels like she's being stabbed in gut  . Vomiting    HPI:   Patricia Stanton is a 35 y.o. female  with a history of abdominal pain, constipation, GERD.  EGD April 2017 normal esophagus, non-bleeding erosive gastropathy (Negative H.pylori).  Ventral hernia repair with mesh in May 2017. History of cholecystectomy in 2016. Last seen in our office 09/19/2017.    Today: Acute onset sharp, stabbing upper abdominal pain with nausea, vomiting, indigestion, and weight loss. In addition to sharp, stabbing, patient describes pain as a tight belt across her upper abdomen that will tighten and release, but never resolved. Worsened with eating and movement. If eating or drinking anything, she gets nauseated and vomits. Vomiting at least once every day. She is able to keep her medications down. Currently, pain is about 9 out of 10 in severity. She is not on a PPI for acid reflux. States she had not been having any trouble at all with acid reflux, upper abdominal pain, nausea, or vomiting prior to this acute onset. Not sure what triggered this episode. Pain also wakes her from sleep.  Yesterday she had water and soup, but vomited some of this back up. Has not eaten today. Pain is currently 9/10 in severity. Denies hematemesis. Denies fever but reports having cold chills today.    No NSAIDs.   Chronic constipation. Will go 2-3 days without a BM. After she passes hard stool, she will have diarrhea. Had a BM yesterday. No BM this morning. Passnig gas today. Not currently taking anything for constipation. Prior trial of Linzess 290 mcg caused explosive diarrhea. No BRBPR or melena.  Takes oxycodone BID for chronic back pain. This is not helping her current abdominal pain..    Weighted 306 lbs on Sunday. 297 lbs today.   Occasional lightheadedness with position changes. This is chronic. No worsening. No syncope.  Past Medical History:  Diagnosis Date  . Anxiety   . Arthritis    left knee  . Asthma   . Chronic abdominal pain   . Chronic headaches   . Depression   . Diabetes mellitus type 2 in obese ([place]) 11/20/2016  . Endometriosis   . Gastroesophageal reflux disease 11/09/2020  . HLD (hyperlipidemia) 02/17/2017  . Hypertension   . 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 Mood disorder ([place])   . Obesity   . Ovarian cyst   . Tobacco abuse     Past Surgical History:  Procedure Laterality Date  . CESAREAN SECTION  2008   [place]  . CHOLECYSTECTOMY N/A 11/25/2014   Procedure: LAPAROSCOPIC CHOLECYSTECTOMY;  Surgeon: Aviva Signs Md, MD;  Location: [facility];  Service: General;  Laterality: N/A;  . ESOPHAGOGASTRODUODENOSCOPY N/A 12/21/2015   Dr. Gala Romney: normal esophagus, non-bleeding erosive gastropathy, normal second portion of duodenum. Reactive gastropathy/chemical gastritis, negative H.pylori  . FEMUR FRACTURE SURGERY Left 1995   tree fell on her  . HERNIA REPAIR    . INCISIONAL HERNIA REPAIR N/A 01/29/2016   Procedure: Fatima Blank HERNIORRHAPHY WITH MESH;  Surgeon: Aviva Signs, MD;  Location: [facility];  Service: General;  Laterality: N/A;  . INSERTION OF MESH  01/29/2016   Procedure: INSERTION OF MESH;  Surgeon:  Aviva Signs, MD;  Location: [facility];  Service: General;;  . KNEE ARTHROSCOPY Left   . LUMBAR LAMINECTOMY/DECOMPRESSION MICRODISCECTOMY Right 05/21/2019   Procedure: Microdiscectomy - Lumbar four-Lumbar five - right;  Surgeon: Eustace Moore, MD;  Location: [facility];  Service: Neurosurgery;  Laterality: Right;  . TUBAL LIGATION      Current Outpatient Medications  Medication Sig Dispense Refill  . albuterol (PROVENTIL) (2.5 MG/3ML) 0.083% nebulizer solution Take 3 mLs (2.5 mg total) by  nebulization every 6 (six) hours as needed for wheezing or shortness of breath. 150 mL 1  . albuterol (VENTOLIN HFA) 108 (90 Base) MCG/ACT inhaler Inhale 2 puffs into the lungs every 6 (six) hours as needed for wheezing or shortness of breath. 18 g 6  . budesonide-formoterol (SYMBICORT) 80-4.5 MCG/ACT inhaler Inhale 2 puffs into the lungs 2 (two) times daily. 1 each 3  . cetirizine (ZYRTEC) 10 MG tablet Take 1 tablet (10 mg total) by mouth daily. 90 tablet 1  . escitalopram (LEXAPRO) 10 MG tablet Take 1 tablet (10 mg total) by mouth daily. 90 tablet 0  . gabapentin (NEURONTIN) 600 MG tablet Take 600 mg by mouth 3 (three) times daily.    Marland Kitchen lisinopril-hydrochlorothiazide (ZESTORETIC) 10-12.5 MG tablet Take 1 tablet by mouth daily. 90 tablet 1  . lovastatin (MEVACOR) 20 MG tablet Take 1 tablet (20 mg total) by mouth at bedtime. 90 tablet 1  . metFORMIN (GLUCOPHAGE XR) 500 MG 24 hr tablet Take 2 tablets (1,000 mg total) by mouth 2 (two) times daily. 360 tablet 1  . methocarbamol (ROBAXIN) 500 MG tablet Take 500 mg by mouth 3 (three) times daily as needed for muscle spasms.     . ondansetron (ZOFRAN) 4 MG tablet Take 1 tablet (4 mg total) by mouth every 8 (eight) hours as needed for nausea or vomiting. 20 tablet 0  . oxyCODONE-acetaminophen (PERCOCET/ROXICET) 5-325 MG tablet Take 1 tablet by mouth every 4 (four) hours as needed for moderate pain. 30 tablet 0  . pantoprazole (PROTONIX) 40 MG tablet Take 1 tablet (40 mg total) by mouth 2 (two) times daily. 60 tablet 3  . prazosin (MINIPRESS) 1 MG capsule Total of 3 mg at night. Take along with 2 mg tab 90 capsule 0  . prazosin (MINIPRESS) 2 MG capsule Take 1 capsule (2 mg total) by mouth at bedtime. 90 capsule 0   No current facility-administered medications for this visit.    Allergies as of 11/09/2020 - Review Complete 11/09/2020  Allergen Reaction Noted  . Tramadol Other (See Comments) 05/21/2019    Family History  Problem Relation Age of Onset   . Hypertension Mother   . Hyperlipidemia Mother   . Fibromyalgia Mother   . Other Mother        degenerative disc disease  . Arthritis Mother   . Kidney disease Mother   . Diabetes Father   . Hypertension Father   . Hyperlipidemia Father   . Heart disease Father 50  . Heart attack Father   . Stroke Father   . Hyperlipidemia Brother   . Hypertension Brother   . Aneurysm Maternal Grandmother        AAA  . Kidney disease Maternal Grandmother   . Kidney disease Maternal Grandfather   . Aneurysm Paternal Grandmother        AAA  . Kidney disease Paternal Grandmother   . Kidney disease Paternal Grandfather   . Colon cancer Neg Hx   . Inflammatory bowel disease Neg Hx  Social History   Socioeconomic History  . Marital status: Divorced    Spouse name: Not on file  . Number of children: 1  . Years of education: 49  . Highest education level: Not on file  Occupational History  . Occupation: disability  Tobacco Use  . Smoking status: Current Every Day Smoker    Packs/day: 0.50    Years: 11.00    Pack years: 5.50    Types: Cigarettes    Start date: 09/17/2003  . Smokeless tobacco: Never Used  Vaping Use  . Vaping Use: Never used  Substance and Sexual Activity  . Alcohol use: No  . Drug use: No  . Sexual activity: Yes    Birth control/protection: Surgical    Comment: BTL  Other Topics Concern  . Not on file  Social History Narrative   Disabled from nursing   Lives at home with daughter [name]   Social Determinants of Health   Financial Resource Strain: Not on file  Food Insecurity: Not on file  Transportation Needs: Not on file  Physical Activity: Not on file  Stress: Not on file  Social Connections: Not on file  Intimate Partner Violence: Not on file    Review of Systems: Gen: See HPI CV: Denies chest pain or heart palpitations. Resp: Denies shortness of breath or cough. GI: See HPI GU : Denies urinary burning, urinary frequency, urinary  hesitancy MS: Chronic back pain. Heme: See HPI  Physical Exam: BP (!) 176/93   Pulse (!) 107   Temp (!) 96.6 F (35.9 C) (Temporal)   LMP 09/18/2020 (Approximate)  General:  Alert and oriented. Pleasant and cooperative. Well-nourished and well-developed. No acute distress. Head:  Normocephalic and atraumatic. Eyes:  Without icterus, sclera clear and conjunctiva pink.  Ears:  Normal auditory acuity. Lungs:  Clear to auscultation bilaterally. No wheezes, rales, or rhonchi. No distress.  Heart:  S1, S2 present without murmurs appreciated.  Abdomen: Obese, +BS, soft, and non-distended. Moderate tenderness to palpation across the upper abdomen and in LLQ. No HSM noted. No guarding or rebound. No masses appreciated.  Rectal:  Deferred  Msk:  Symmetrical without gross deformities. Normal posture. Extremities:  Without edema. Neurologic:  Alert and  oriented x4;  grossly normal neurologically. Skin:  Intact without significant lesions or rashes. Psych: Normal mood and affect.

## 2020-11-09 ENCOUNTER — Ambulatory Visit: Payer: Medicaid Other | Admitting: Gastroenterology

## 2020-11-09 ENCOUNTER — Telehealth: Payer: Self-pay | Admitting: *Deleted

## 2020-11-09 ENCOUNTER — Encounter: Payer: Self-pay | Admitting: Gastroenterology

## 2020-11-09 ENCOUNTER — Other Ambulatory Visit: Payer: Self-pay

## 2020-11-09 VITALS — BP 176/93 | HR 107 | Temp 96.6°F | Ht 65.0 in | Wt 297.8 lb

## 2020-11-09 DIAGNOSIS — R1010 Upper abdominal pain, unspecified: Secondary | ICD-10-CM

## 2020-11-09 DIAGNOSIS — K5900 Constipation, unspecified: Secondary | ICD-10-CM

## 2020-11-09 DIAGNOSIS — K219 Gastro-esophageal reflux disease without esophagitis: Secondary | ICD-10-CM | POA: Diagnosis not present

## 2020-11-09 DIAGNOSIS — R634 Abnormal weight loss: Secondary | ICD-10-CM

## 2020-11-09 DIAGNOSIS — R112 Nausea with vomiting, unspecified: Secondary | ICD-10-CM | POA: Diagnosis not present

## 2020-11-09 HISTORY — DX: Gastro-esophageal reflux disease without esophagitis: K21.9

## 2020-11-09 LAB — COMPLETE METABOLIC PANEL WITH GFR
AG Ratio: 1.5 (calc) (ref 1.0–2.5)
ALT: 19 U/L (ref 6–29)
AST: 15 U/L (ref 10–30)
Albumin: 3.8 g/dL (ref 3.6–5.1)
Alkaline phosphatase (APISO): 95 U/L (ref 31–125)
BUN: 11 mg/dL (ref 7–25)
CO2: 23 mmol/L (ref 20–32)
Calcium: 8.9 mg/dL (ref 8.6–10.2)
Chloride: 98 mmol/L (ref 98–110)
Creat: 0.51 mg/dL (ref 0.50–1.10)
GFR, Est African American: 144 mL/min/{1.73_m2} (ref >=60)
GFR, Est Non African American: 125 mL/min/{1.73_m2} (ref >=60)
Globulin: 2.5 g/dL (calc) (ref 1.9–3.7)
Glucose, Bld: 329 mg/dL — ABNORMAL HIGH (ref 65–139)
Potassium: 4.1 mmol/L (ref 3.5–5.3)
Sodium: 133 mmol/L — ABNORMAL LOW (ref 135–146)
Total Bilirubin: 0.5 mg/dL (ref 0.2–1.2)
Total Protein: 6.3 g/dL (ref 6.1–8.1)

## 2020-11-09 LAB — CBC WITH DIFFERENTIAL/PLATELET
Absolute Monocytes: 1096 cells/uL — ABNORMAL HIGH (ref 200–950)
Basophils Absolute: 61 cells/uL (ref 0–200)
Basophils Relative: 0.3 %
Eosinophils Absolute: 142 cells/uL (ref 15–500)
Eosinophils Relative: 0.7 %
HCT: 39.4 % (ref 35.0–45.0)
Hemoglobin: 12.4 g/dL (ref 11.7–15.5)
Lymphs Abs: 3126 cells/uL (ref 850–3900)
MCH: 23.1 pg — ABNORMAL LOW (ref 27.0–33.0)
MCHC: 31.5 g/dL — ABNORMAL LOW (ref 32.0–36.0)
MCV: 73.4 fL — ABNORMAL LOW (ref 80.0–100.0)
MPV: 9.9 fL (ref 7.5–12.5)
Monocytes Relative: 5.4 %
Neutro Abs: 15875 cells/uL — ABNORMAL HIGH (ref 1500–7800)
Neutrophils Relative %: 78.2 %
Platelets: 319 10*3/uL (ref 140–400)
RBC: 5.37 10*6/uL — ABNORMAL HIGH (ref 3.80–5.10)
RDW: 15.8 % — ABNORMAL HIGH (ref 11.0–15.0)
Total Lymphocyte: 15.4 %
WBC: 20.3 10*3/uL — ABNORMAL HIGH (ref 3.8–10.8)

## 2020-11-09 LAB — LIPASE: Lipase: 61 U/L — ABNORMAL HIGH (ref 7–60)

## 2020-11-09 MED ORDER — PANTOPRAZOLE SODIUM 40 MG PO TBEC: 40.0000 mg | DELAYED_RELEASE_TABLET | Freq: Two times a day (BID) | ORAL | 3 refills | Status: DC

## 2020-11-09 MED ORDER — ONDANSETRON HCL 4 MG PO TABS: 4.0000 mg | ORAL_TABLET | Freq: Three times a day (TID) | ORAL | 0 refills | Status: DC | PRN

## 2020-11-09 NOTE — Telephone Encounter (Signed)
PA submitted via availity and clinicals submitted. Tracking# [id number]

## 2020-11-09 NOTE — Progress Notes (Signed)
Cc'ed to pcp °

## 2020-11-09 NOTE — Assessment & Plan Note (Addendum)
35 year old female with acute onset upper abdominal pain, nausea, vomiting, and indigestion with associated weight loss x1 week.  Pain is constant but does wax and wane in severity, worsened with eating and movement. Also waking her from sleep.  Keeping very little food or liquids down.  Reports about 10 pound weight loss over the last week.  No fever but has been having cold chills today.  Constipation at baseline with BMs every 2-3 days without recent change.  Still passing gas.  No BRBPR, melena, or hematemesis.  History of cholecystectomy, ventral hernia repair with mesh, and erosive gastropathy with negative H. pylori biopsies in April 2017.  Denies NSAIDs and alcohol. Abdominal exam with moderate TTP across upper abdomen as well as in LLQ. No rebound.   Differentials include gastritis, duodenitis, PUD, pancreatitis, gastric outlet obstruction, colitis, diverticulitis.   Plan:  CT A/P with contrast STAT CBC, CMP, Lipase Start Protonix 40 mg BID Zofran 4 mg q8 hours as needed.  Focus on hydration.  Follow a liquid or soft bland diet as tolerated.  Further recommendations to follow. She may need an EGD in the future, but need to rule out acute process. Advised to proceed to the ED if any worsening symptoms as I am not sure if her CT will be approved today.

## 2020-11-09 NOTE — Assessment & Plan Note (Signed)
New onset and associated with upper abdominal pain, nausea, and vomiting as discussed below. Start Protonix 40 mg BID.

## 2020-11-09 NOTE — Assessment & Plan Note (Addendum)
Chronic. Not well managed. Not currently taking anything to help with BMs. Previously tried on Linzess 290 mcg which caused significant diarrhea. Typically with BMs every 2-3 days. No alarm symptoms.   Advised Patricia Stanton start MiraLAX 17 g daily in 8 oz of water once over acute illness as discussed below under upper abdominal pian.

## 2020-11-09 NOTE — Patient Instructions (Signed)
We will try to get approval for STAT CT today.   Please have labs completed today at [facility].   Start Protonix 40 mg twice daily 30 minutes before breakfast and dinner.   Use Zofran 4 mg every 8 hours as needed for nausea and vomiting.   Focus on hydration.   Follow a liquid diet or soft bland diet for now.   Once over acute illness, you can start MiraLAX 17 daily in 8 oz of water to help with constipation.   We will call you with results and further recommendations. If any worsening of abdominal pain, inability to keep hydrated, or not able to keep medications down, proceed to the emergency room.   Aliene Altes, PA-C [facility]

## 2020-11-09 NOTE — Assessment & Plan Note (Signed)
Addressed under upper abdominal pain.

## 2020-11-10 ENCOUNTER — Ambulatory Visit (HOSPITAL_COMMUNITY): Payer: Medicaid Other

## 2020-11-10 NOTE — Telephone Encounter (Signed)
Called CS and patient can come over now as she needs to drink contrast for 2 hrs.  Called pt and she will do so. Aware not to eat or drink anything.

## 2020-11-10 NOTE — Telephone Encounter (Signed)
PA approved. Auth# [id number] DOS 11/09/2020-12/07/2020  Called pt and made aware. She has not ate/drank anything this morning. Advised will call back with appt.

## 2020-11-13 ENCOUNTER — Telehealth: Payer: Self-pay | Admitting: *Deleted

## 2020-11-13 ENCOUNTER — Telehealth: Payer: Self-pay | Admitting: Internal Medicine

## 2020-11-13 ENCOUNTER — Ambulatory Visit (HOSPITAL_COMMUNITY)
Admission: RE | Admit: 2020-11-13 | Discharge: 2020-11-13 | Disposition: A | Discharge status: Home / Self Care | Payer: Medicaid Other | Source: Ambulatory Visit | Attending: Gastroenterology | Admitting: Gastroenterology

## 2020-11-13 ENCOUNTER — Other Ambulatory Visit: Payer: Self-pay

## 2020-11-13 DIAGNOSIS — R1010 Upper abdominal pain, unspecified: Secondary | ICD-10-CM | POA: Insufficient documentation

## 2020-11-13 DIAGNOSIS — R109 Unspecified abdominal pain: Secondary | ICD-10-CM | POA: Diagnosis not present

## 2020-11-13 DIAGNOSIS — E278 Other specified disorders of adrenal gland: Secondary | ICD-10-CM

## 2020-11-13 DIAGNOSIS — R112 Nausea with vomiting, unspecified: Secondary | ICD-10-CM | POA: Insufficient documentation

## 2020-11-13 MED ORDER — IOHEXOL 300 MG/ML  SOLN
100.0000 mL | Freq: Once | INTRAMUSCULAR | Status: AC | PRN
  Administered 2020-11-13: 100 mL via INTRAVENOUS

## 2020-11-13 NOTE — Telephone Encounter (Signed)
PLEASE CALL PATIENT, SHE HAS A QUESTION ABOUT THE CT SHE HAD

## 2020-11-13 NOTE — Telephone Encounter (Signed)
Spoke with patient.  Questions were answered.  She wanted to know if the adrenal nodule may be contributing to her upper GI symptoms or diabetes and high blood pressure.  Doubt adrenal nodule is contributing to GI symptoms.  Unable to say if this is contributing to diabetes or hypertension.  Further recommendations pending MRI.

## 2020-11-13 NOTE — Telephone Encounter (Signed)
Patient called in requesting a call from [facility] to discuss her recent CT. She had a few more questions

## 2020-11-14 NOTE — Telephone Encounter (Signed)
Pt discussed further with Aliene Altes, PA yesterday 11/13/2020.

## 2020-11-20 NOTE — Patient Instructions (Signed)
Patricia Stanton  11/20/2020     @PREFPERIOPPHARMACY @   Your procedure is scheduled on 11/23/2020   Report to [name] at [facility]    Call this number if you have problems the morning of surgery:  [phone]   Remember:  Follow the diet instructions given to you by the office.                      Take these medicines the morning of surgery with A SIP OF WATER  Zyrtec, lexapro, gabapentin, robaxin, zofran(if needed) oxycodone( if needed), protonix, minipress.   Use your inhalers and your nebulizer before you come. Bring your rescue inhaler with you.    Please brush your teeth.  Do not wear jewelry, make-up or nail polish.  Do not wear lotions, powders, or perfumes, or deodorant.  Do not shave 48 hours prior to surgery.  Men may shave face and neck.  Do not bring valuables to the hospital.  [facility] is not responsible for any belongings or valuables.  Contacts, dentures or bridgework may not be worn into surgery.  Leave your suitcase in the car.  After surgery it may be brought to your room.  For patients admitted to the hospital, discharge time will be determined by your treatment team.  Patients discharged the day of surgery will not be allowed to drive home and must have someone with them for 24 hours.   Special instructions:  DO NOT smoke tobacco or vape the morning of your procedure.   Please read over the following fact sheets that you were given. Anesthesia Post-op Instructions and Care and Recovery After Surgery       Upper Endoscopy, Adult, Care After This sheet gives you information about how to care for yourself after your procedure. Your health care provider may also give you more specific instructions. If you have problems or questions, contact your health care provider. What can I expect after the procedure? After the procedure, it is common to have:  A sore throat.  Mild stomach pain or discomfort.  Bloating.  Nausea. Follow these  instructions at home:  Follow instructions from your health care provider about what to eat or drink after your procedure.  Return to your normal activities as told by your health care provider. Ask your health care provider what activities are safe for you.  Take over-the-counter and prescription medicines only as told by your health care provider.  If you were given a sedative during the procedure, it can affect you for several hours. Do not drive or operate machinery until your health care provider says that it is safe.  Keep all follow-up visits as told by your health care provider. This is important.   Contact a health care provider if you have:  A sore throat that lasts longer than one day.  Trouble swallowing. Get help right away if:  You vomit blood or your vomit looks like coffee grounds.  You have: ? A fever. ? Bloody, black, or tarry stools. ? A severe sore throat or you cannot swallow. ? Difficulty breathing. ? Severe pain in your chest or abdomen. Summary  After the procedure, it is common to have a sore throat, mild stomach discomfort, bloating, and nausea.  If you were given a sedative during the procedure, it can affect you for several hours. Do not drive or operate machinery until your health care provider says that it is safe.  Follow  instructions from your health care provider about what to eat or drink after your procedure.  Return to your normal activities as told by your health care provider. This information is not intended to replace advice given to you by your health care provider. Make sure you discuss any questions you have with your health care provider. Document Revised: 08/31/2019 Document Reviewed: 02/02/2018 Elsevier Patient Education  2021 Patricia Stanton After This sheet gives you information about how to care for yourself after your procedure. Your health care provider may also give you more specific  instructions. If you have problems or questions, contact your health care provider. What can I expect after the procedure? After the procedure, it is common to have:  Tiredness.  Forgetfulness about what happened after the procedure.  Impaired judgment for important decisions.  Nausea or vomiting.  Some difficulty with balance. Follow these instructions at home: For the time period you were told by your health care provider:  Rest as needed.  Do not participate in activities where you could fall or become injured.  Do not drive or use machinery.  Do not drink alcohol.  Do not take sleeping pills or medicines that cause drowsiness.  Do not make important decisions or sign legal documents.  Do not take care of children on your own.      Eating and drinking  Follow the diet that is recommended by your health care provider.  Drink enough fluid to keep your urine pale yellow.  If you vomit: ? Drink water, juice, or soup when you can drink without vomiting. ? Make sure you have little or no nausea before eating solid foods. General instructions  Have a responsible adult stay with you for the time you are told. It is important to have someone help care for you until you are awake and alert.  Take over-the-counter and prescription medicines only as told by your health care provider.  If you have sleep apnea, surgery and certain medicines can increase your risk for breathing problems. Follow instructions from your health care provider about wearing your sleep device: ? Anytime you are sleeping, including during daytime naps. ? While taking prescription pain medicines, sleeping medicines, or medicines that make you drowsy.  Avoid smoking.  Keep all follow-up visits as told by your health care provider. This is important. Contact a health care provider if:  You keep feeling nauseous or you keep vomiting.  You feel light-headed.  You are still sleepy or having trouble  with balance after 24 hours.  You develop a rash.  You have a fever.  You have redness or swelling around the IV site. Get help right away if:  You have trouble breathing.  You have new-onset confusion at home. Summary  For several hours after your procedure, you may feel tired. You may also be forgetful and have poor judgment.  Have a responsible adult stay with you for the time you are told. It is important to have someone help care for you until you are awake and alert.  Rest as told. Do not drive or operate machinery. Do not drink alcohol or take sleeping pills.  Get help right away if you have trouble breathing, or if you suddenly become confused. This information is not intended to replace advice given to you by your health care provider. Make sure you discuss any questions you have with your health care provider. Document Revised: 05/18/2020 Document Reviewed: 08/05/2019 Elsevier Patient Education  2021 Elsevier  Inc[employer]  

## 2020-11-21 ENCOUNTER — Other Ambulatory Visit: Payer: Self-pay

## 2020-11-21 ENCOUNTER — Other Ambulatory Visit (HOSPITAL_COMMUNITY)
Admission: RE | Admit: 2020-11-21 | Discharge: 2020-11-21 | Disposition: A | Discharge status: Home / Self Care | Payer: Medicaid Other | Source: Ambulatory Visit | Attending: Internal Medicine | Admitting: Internal Medicine

## 2020-11-21 ENCOUNTER — Encounter (HOSPITAL_COMMUNITY): Payer: Self-pay

## 2020-11-21 ENCOUNTER — Encounter (HOSPITAL_COMMUNITY)
Admission: RE | Admit: 2020-11-21 | Discharge: 2020-11-21 | Disposition: A | Discharge status: Home / Self Care | Payer: Medicaid Other | Source: Ambulatory Visit | Attending: Internal Medicine | Admitting: Internal Medicine

## 2020-11-21 DIAGNOSIS — Z01818 Encounter for other preprocedural examination: Secondary | ICD-10-CM | POA: Insufficient documentation

## 2020-11-21 DIAGNOSIS — Z20822 Contact with and (suspected) exposure to covid-19: Secondary | ICD-10-CM | POA: Insufficient documentation

## 2020-11-21 HISTORY — DX: Neoplasm of unspecified behavior of endocrine glands and other parts of nervous system: D49.7

## 2020-11-21 LAB — BASIC METABOLIC PANEL
Anion gap: 11 (ref 5–15)
BUN: 10 mg/dL (ref 6–20)
CO2: 21 mmol/L — ABNORMAL LOW (ref 22–32)
Calcium: 9.3 mg/dL (ref 8.9–10.3)
Chloride: 101 mmol/L (ref 98–111)
Creatinine, Ser: 0.47 mg/dL (ref 0.44–1.00)
GFR, Estimated: >60 mL/min (ref >60)
Glucose, Bld: 277 mg/dL — ABNORMAL HIGH (ref 70–99)
Potassium: 4.2 mmol/L (ref 3.5–5.1)
Sodium: 133 mmol/L — ABNORMAL LOW (ref 135–145)

## 2020-11-21 LAB — SARS CORONAVIRUS 2 (TAT 6-24 HRS): SARS Coronavirus 2: NEGATIVE

## 2020-11-23 ENCOUNTER — Ambulatory Visit (HOSPITAL_COMMUNITY)
Admission: RE | Admit: 2020-11-23 | Discharge: 2020-11-23 | Disposition: A | Discharge status: Home / Self Care | Payer: Medicaid Other | Attending: Internal Medicine | Admitting: Internal Medicine

## 2020-11-23 ENCOUNTER — Ambulatory Visit (HOSPITAL_COMMUNITY): Payer: Medicaid Other | Admitting: Anesthesiology

## 2020-11-23 ENCOUNTER — Other Ambulatory Visit: Payer: Self-pay

## 2020-11-23 ENCOUNTER — Encounter (HOSPITAL_COMMUNITY)
Admission: RE | Disposition: A | Discharge status: Home / Self Care | Payer: Self-pay | Source: Home / Self Care | Attending: Internal Medicine

## 2020-11-23 ENCOUNTER — Encounter (HOSPITAL_COMMUNITY): Payer: Self-pay | Admitting: Internal Medicine

## 2020-11-23 DIAGNOSIS — Z9049 Acquired absence of other specified parts of digestive tract: Secondary | ICD-10-CM | POA: Insufficient documentation

## 2020-11-23 DIAGNOSIS — E1122 Type 2 diabetes mellitus with diabetic chronic kidney disease: Secondary | ICD-10-CM | POA: Diagnosis not present

## 2020-11-23 DIAGNOSIS — N189 Chronic kidney disease, unspecified: Secondary | ICD-10-CM | POA: Diagnosis not present

## 2020-11-23 DIAGNOSIS — Z7951 Long term (current) use of inhaled steroids: Secondary | ICD-10-CM | POA: Insufficient documentation

## 2020-11-23 DIAGNOSIS — Z79899 Other long term (current) drug therapy: Secondary | ICD-10-CM | POA: Diagnosis not present

## 2020-11-23 DIAGNOSIS — K449 Diaphragmatic hernia without obstruction or gangrene: Secondary | ICD-10-CM | POA: Diagnosis not present

## 2020-11-23 DIAGNOSIS — F17210 Nicotine dependence, cigarettes, uncomplicated: Secondary | ICD-10-CM | POA: Diagnosis not present

## 2020-11-23 DIAGNOSIS — Z7984 Long term (current) use of oral hypoglycemic drugs: Secondary | ICD-10-CM | POA: Insufficient documentation

## 2020-11-23 DIAGNOSIS — R634 Abnormal weight loss: Secondary | ICD-10-CM | POA: Diagnosis not present

## 2020-11-23 DIAGNOSIS — K219 Gastro-esophageal reflux disease without esophagitis: Secondary | ICD-10-CM | POA: Diagnosis not present

## 2020-11-23 DIAGNOSIS — I129 Hypertensive chronic kidney disease with stage 1 through stage 4 chronic kidney disease, or unspecified chronic kidney disease: Secondary | ICD-10-CM | POA: Diagnosis not present

## 2020-11-23 DIAGNOSIS — R1013 Epigastric pain: Secondary | ICD-10-CM

## 2020-11-23 HISTORY — PX: ESOPHAGOGASTRODUODENOSCOPY (EGD) WITH PROPOFOL: SHX5813

## 2020-11-23 LAB — GLUCOSE, CAPILLARY: Glucose-Capillary: 186 mg/dL — ABNORMAL HIGH (ref 70–99)

## 2020-11-23 SURGERY — ESOPHAGOGASTRODUODENOSCOPY (EGD) WITH PROPOFOL
Anesthesia: General

## 2020-11-23 MED ORDER — PROPOFOL 500 MG/50ML IV EMUL
INTRAVENOUS | Status: DC | PRN
  Administered 2020-11-23: 150 ug/kg/min via INTRAVENOUS

## 2020-11-23 MED ORDER — STERILE WATER FOR IRRIGATION IR SOLN
Status: DC | PRN
  Administered 2020-11-23: 100 mL

## 2020-11-23 MED ORDER — LIDOCAINE VISCOUS HCL 2 % MT SOLN
15.0000 mL | Freq: Once | OROMUCOSAL | Status: AC
  Administered 2020-11-23: 15 mL via OROMUCOSAL

## 2020-11-23 MED ORDER — OXYCODONE-ACETAMINOPHEN 5-325 MG PO TABS
ORAL_TABLET | ORAL | Status: AC
  Filled 2020-11-23: qty 1

## 2020-11-23 MED ORDER — LIDOCAINE VISCOUS HCL 2 % MT SOLN
OROMUCOSAL | Status: AC
  Filled 2020-11-23: qty 15

## 2020-11-23 MED ORDER — LIDOCAINE HCL (CARDIAC) PF 100 MG/5ML IV SOSY
PREFILLED_SYRINGE | INTRAVENOUS | Status: DC | PRN
  Administered 2020-11-23: 50 mg via INTRAVENOUS

## 2020-11-23 MED ORDER — LACTATED RINGERS IV SOLN: INTRAVENOUS | Status: DC

## 2020-11-23 MED ORDER — PROPOFOL 10 MG/ML IV BOLUS
INTRAVENOUS | Status: DC | PRN
Start: 2020-11-23 — End: 2020-11-23
  Administered 2020-11-23: 100 mg via INTRAVENOUS

## 2020-11-23 MED ORDER — OXYCODONE-ACETAMINOPHEN 5-325 MG PO TABS
1.0000 | ORAL_TABLET | Freq: Once | ORAL | Status: AC
Start: 2020-11-23 — End: 2020-11-23
  Administered 2020-11-23: 1 via ORAL

## 2020-11-23 NOTE — Anesthesia Procedure Notes (Signed)
Date/Time: 11/23/2020 11:24 AM Performed by: Orlie Dakin, CRNA Pre-anesthesia Checklist: Patient identified, Emergency Drugs available, Suction available and Patient being monitored Patient Re-evaluated:Patient Re-evaluated prior to induction Oxygen Delivery Method: Nasal cannula Induction Type: IV induction Placement Confirmation: positive ETCO2

## 2020-11-23 NOTE — Anesthesia Postprocedure Evaluation (Signed)
Anesthesia Post Note  Patient: Patricia Stanton  Procedure(s) Performed: ESOPHAGOGASTRODUODENOSCOPY (EGD) WITH PROPOFOL (N/A )  Patient location during evaluation: Phase II Anesthesia Type: General Level of consciousness: awake and alert and oriented Pain management: pain level controlled Vital Signs Assessment: post-procedure vital signs reviewed and stable Respiratory status: spontaneous breathing, nonlabored ventilation and respiratory function stable Cardiovascular status: blood pressure returned to baseline and stable Postop Assessment: no apparent nausea or vomiting Anesthetic complications: no   No complications documented.   Last Vitals:  Vitals:   11/23/20 1040 11/23/20 1130  BP: (!) 145/72 112/75  Pulse: (!) 105 (!) 109  Resp: (!) 22 (!) 23  Temp: 36.9 C 36.9 C  SpO2: 97% 97%    Last Pain:  Vitals:   11/23/20 1130  TempSrc: Oral  PainSc: 10-Worst pain ever                 Orlie Dakin

## 2020-11-23 NOTE — Anesthesia Preprocedure Evaluation (Signed)
Anesthesia Evaluation  Patient identified by MRN, date of birth, ID band Patient awake    Reviewed: Allergy & Precautions, NPO status , Patient's Chart, lab work & pertinent test results  History of Anesthesia Complications Negative for: history of anesthetic complications  Airway Mallampati: III  TM Distance: >3 FB Neck ROM: Full    Dental  (+) Dental Advisory Given, Teeth Intact   Pulmonary asthma , Current SmokerPatient did not abstain from smoking.,    Pulmonary exam normal breath sounds clear to auscultation       Cardiovascular Exercise Tolerance: Poor hypertension, Pt. on medications  Rhythm:Regular Rate:Tachycardia     Neuro/Psych  Headaches, PSYCHIATRIC DISORDERS Anxiety Depression    GI/Hepatic GERD  Medicated,  Endo/Other  diabetes, Well Controlled, Type 2, Oral Hypoglycemic AgentsMorbid obesity  Renal/GU Renal disease     Musculoskeletal  (+) Arthritis ,   Abdominal   Peds  Hematology   Anesthesia Other Findings   Reproductive/Obstetrics                             Anesthesia Physical Anesthesia Plan  ASA: III  Anesthesia Plan: General   Post-op Pain Management:    Induction: Intravenous  PONV Risk Score and Plan: Propofol infusion  Airway Management Planned: Nasal Cannula and Natural Airway  Additional Equipment:   Intra-op Plan:   Post-operative Plan:   Informed Consent: I have reviewed the patients History and Physical, chart, labs and discussed the procedure including the risks, benefits and alternatives for the proposed anesthesia with the patient or authorized representative who has indicated his/her understanding and acceptance.     Dental advisory given  Plan Discussed with: CRNA and Surgeon  Anesthesia Plan Comments:         Anesthesia Quick Evaluation

## 2020-11-23 NOTE — Discharge Instructions (Signed)
EGD Discharge instructions Please read the instructions outlined below and refer to this sheet in the next few weeks. These discharge instructions provide you with general information on caring for yourself after you leave the hospital. Your doctor may also give you specific instructions. While your treatment has been planned according to the most current medical practices available, unavoidable complications occasionally occur. If you have any problems or questions after discharge, please call your doctor. ACTIVITY  You may resume your regular activity but move at a slower pace for the next 24 hours.   Take frequent rest periods for the next 24 hours.   Walking will help expel (get rid of) the air and reduce the bloated feeling in your abdomen.   No driving for 24 hours (because of the anesthesia (medicine) used during the test).   You may shower.   Do not sign any important legal documents or operate any machinery for 24 hours (because of the anesthesia used during the test).  NUTRITION  Drink plenty of fluids.   You may resume your normal diet.   Begin with a light meal and progress to your normal diet.   Avoid alcoholic beverages for 24 hours or as instructed by your caregiver.  MEDICATIONS  You may resume your normal medications unless your caregiver tells you otherwise.  WHAT YOU CAN EXPECT TODAY  You may experience abdominal discomfort such as a feeling of fullness or "gas" pains.  FOLLOW-UP  Your doctor will discuss the results of your test with you.  SEEK IMMEDIATE MEDICAL ATTENTION IF ANY OF THE FOLLOWING OCCUR:  Excessive nausea (feeling sick to your stomach) and/or vomiting.   Severe abdominal pain and distention (swelling).   Trouble swallowing.   Temperature over 101 F (37.8 C).   Rectal bleeding or vomiting of blood.    Check with the prescribing provider regarding the correct dose of Minipress that you are supposed to be taking  You have a small  hiatal hernia but nothing else found today.  This hernia may contribute to reflux a little however it has nothing to do with your abdominal pain which I feel is coming from your abdominal wall.  You should go back and see Dr.Jenkins to have your abdominal wall  reassessed  We will plan to see you back in the office in 3 months Aliene Altes)  At patient request, I called Julio Alm [phone] -reviewed findings and recommendations    Monitored Anesthesia Care, Care After This sheet gives you information about how to care for yourself after your procedure. Your health care provider may also give you more specific instructions. If you have problems or questions, contact your health care provider. What can I expect after the procedure? After the procedure, it is common to have:  Tiredness.  Forgetfulness about what happened after the procedure.  Impaired judgment for important decisions.  Nausea or vomiting.  Some difficulty with balance. Follow these instructions at home: For the time period you were told by your health care provider:  Rest as needed.  Do not participate in activities where you could fall or become injured.  Do not drive or use machinery.  Do not drink alcohol.  Do not take sleeping pills or medicines that cause drowsiness.  Do not make important decisions or sign legal documents.  Do not take care of children on your own.      Eating and drinking  Follow the diet that is recommended by your health care provider.  Drink enough  fluid to keep your urine pale yellow.  If you vomit: ? Drink water, juice, or soup when you can drink without vomiting. ? Make sure you have little or no nausea before eating solid foods. General instructions  Have a responsible adult stay with you for the time you are told. It is important to have someone help care for you until you are awake and alert.  Take over-the-counter and prescription medicines only as told  by your health care provider.  If you have sleep apnea, surgery and certain medicines can increase your risk for breathing problems. Follow instructions from your health care provider about wearing your sleep device: ? Anytime you are sleeping, including during daytime naps. ? While taking prescription pain medicines, sleeping medicines, or medicines that make you drowsy.  Avoid smoking.  Keep all follow-up visits as told by your health care provider. This is important. Contact a health care provider if:  You keep feeling nauseous or you keep vomiting.  You feel light-headed.  You are still sleepy or having trouble with balance after 24 hours.  You develop a rash.  You have a fever.  You have redness or swelling around the IV site. Get help right away if:  You have trouble breathing.  You have new-onset confusion at home. Summary  For several hours after your procedure, you may feel tired. You may also be forgetful and have poor judgment.  Have a responsible adult stay with you for the time you are told. It is important to have someone help care for you until you are awake and alert.  Rest as told. Do not drive or operate machinery. Do not drink alcohol or take sleeping pills.  Get help right away if you have trouble breathing, or if you suddenly become confused. This information is not intended to replace advice given to you by your health care provider. Make sure you discuss any questions you have with your health care provider. Document Revised: 05/18/2020 Document Reviewed: 08/05/2019 Elsevier Patient Education  2021 [employer].     Hiatal Hernia  A hiatal hernia occurs when part of the stomach slides above the muscle that separates the abdomen from the chest (diaphragm). A person can be born with a hiatal hernia (congenital), or it may develop over time. In almost all cases of hiatal hernia, only the top part of the stomach pushes through the diaphragm. Many  people have a hiatal hernia with no symptoms. The larger the hernia, the more likely it is that you will have symptoms. In some cases, a hiatal hernia allows stomach acid to flow back into the tube that carries food from your mouth to your stomach (esophagus). This may cause heartburn symptoms. Severe heartburn symptoms may mean that you have developed a condition called gastroesophageal reflux disease (GERD). What are the causes? This condition is caused by a weakness in the opening (hiatus) where the esophagus passes through the diaphragm to attach to the upper part of the stomach. A person may be born with a weakness in the hiatus, or a weakness can develop over time. What increases the risk? This condition is more likely to develop in:  Older people. Age is a major risk factor for a hiatal hernia, especially if you are over the age of 83.  Pregnant women.  People who are overweight.  People who have frequent constipation. What are the signs or symptoms? Symptoms of this condition usually develop in the form of GERD symptoms. Symptoms include:  Heartburn.  Belching.  Indigestion.  Trouble swallowing.  Coughing or wheezing.  Sore throat.  Hoarseness.  Chest pain.  Nausea and vomiting. How is this diagnosed? This condition may be diagnosed during testing for GERD. Tests that may be done include:  X-rays of your stomach or chest.  An upper gastrointestinal (GI) series. This is an X-ray exam of your GI tract that is taken after you swallow a chalky liquid that shows up clearly on the X-ray.  Endoscopy. This is a procedure to look into your stomach using a thin, flexible tube that has a tiny camera and light on the end of it. How is this treated? This condition may be treated by:  Dietary and lifestyle changes to help reduce GERD symptoms.  Medicines. These may include: ? Over-the-counter antacids. ? Medicines that make your stomach empty more quickly. ? Medicines that  block the production of stomach acid (H2 blockers). ? Stronger medicines to reduce stomach acid (proton pump inhibitors).  Surgery to repair the hernia, if other treatments are not helping. If you have no symptoms, you may not need treatment. Follow these instructions at home: Lifestyle and activity  Do not use any products that contain nicotine or tobacco, such as cigarettes and e-cigarettes. If you need help quitting, ask your health care provider.  Try to achieve and maintain a healthy body weight.  Avoid putting pressure on your abdomen. Anything that puts pressure on your abdomen increases the amount of acid that may be pushed up into your esophagus. ? Avoid bending over, especially after eating. ? Raise the head of your bed by putting blocks under the legs. This keeps your head and esophagus higher than your stomach. ? Do not wear tight clothing around your chest or stomach. ? Try not to strain when having a bowel movement, when urinating, or when lifting heavy objects. Eating and drinking  Avoid foods that can worsen GERD symptoms. These may include: ? Fatty foods, like fried foods. ? Citrus fruits, like oranges or lemon. ? Other foods and drinks that contain acid, like orange juice or tomatoes. ? Spicy food. ? Chocolate.  Eat frequent small meals instead of three large meals a day. This helps prevent your stomach from getting too full. ? Eat slowly. ? Do not lie down right after eating. ? Do not eat 1-2 hours before bed.  Do not drink beverages with caffeine. These include cola, coffee, cocoa, and tea.  Do not drink alcohol. General instructions  Take over-the-counter and prescription medicines only as told by your health care provider.  Keep all follow-up visits as told by your health care provider. This is important. Contact a health care provider if:  Your symptoms are not controlled with medicines or lifestyle changes.  You are having trouble swallowing.  You  have coughing or wheezing that will not go away. Get help right away if:  Your pain is getting worse.  Your pain spreads to your arms, neck, jaw, teeth, or back.  You have shortness of breath.  You sweat for no reason.  You feel sick to your stomach (nauseous) or you vomit.  You vomit blood.  You have bright red blood in your stools.  You have black, tarry stools. This information is not intended to replace advice given to you by your health care provider. Make sure you discuss any questions you have with your health care provider. Document Revised: 08/15/2017 Document Reviewed: 04/07/2017 Elsevier Patient Education  [place]    Monitored Anesthesia Care,  Care After This sheet gives you information about how to care for yourself after your procedure. Your health care provider may also give you more specific instructions. If you have problems or questions, contact your health care provider. What can I expect after the procedure? After the procedure, it is common to have:  Tiredness.  Forgetfulness about what happened after the procedure.  Impaired judgment for important decisions.  Nausea or vomiting.  Some difficulty with balance. Follow these instructions at home: For the time period you were told by your health care provider:  Rest as needed.  Do not participate in activities where you could fall or become injured.  Do not drive or use machinery.  Do not drink alcohol.  Do not take sleeping pills or medicines that cause drowsiness.  Do not make important decisions or sign legal documents.  Do not take care of children on your own.      Eating and drinking  Follow the diet that is recommended by your health care provider.  Drink enough fluid to keep your urine pale yellow.  If you vomit: ? Drink water, juice, or soup when you can drink without vomiting. ? Make sure you have little or no nausea before eating solid foods. General  instructions  Have a responsible adult stay with you for the time you are told. It is important to have someone help care for you until you are awake and alert.  Take over-the-counter and prescription medicines only as told by your health care provider.  If you have sleep apnea, surgery and certain medicines can increase your risk for breathing problems. Follow instructions from your health care provider about wearing your sleep device: ? Anytime you are sleeping, including during daytime naps. ? While taking prescription pain medicines, sleeping medicines, or medicines that make you drowsy.  Avoid smoking.  Keep all follow-up visits as told by your health care provider. This is important. Contact a health care provider if:  You keep feeling nauseous or you keep vomiting.  You feel light-headed.  You are still sleepy or having trouble with balance after 24 hours.  You develop a rash.  You have a fever.  You have redness or swelling around the IV site. Get help right away if:  You have trouble breathing.  You have new-onset confusion at home. Summary  For several hours after your procedure, you may feel tired. You may also be forgetful and have poor judgment.  Have a responsible adult stay with you for the time you are told. It is important to have someone help care for you until you are awake and alert.  Rest as told. Do not drive or operate machinery. Do not drink alcohol or take sleeping pills.  Get help right away if you have trouble breathing, or if you suddenly become confused. This information is not intended to replace advice given to you by your health care provider. Make sure you discuss any questions you have with your health care provider. Document Revised: 05/18/2020 Document Reviewed: 08/05/2019 Elsevier Patient Education  2021 [employer]. .

## 2020-11-23 NOTE — Transfer of Care (Signed)
Immediate Anesthesia Transfer of Care Note  Patient: Patricia Stanton  Procedure(s) Performed: ESOPHAGOGASTRODUODENOSCOPY (EGD) WITH PROPOFOL (N/A )  Patient Location: Short Stay  Anesthesia Type:General  Level of Consciousness: awake, alert  and oriented  Airway & Oxygen Therapy: Patient Spontanous Breathing  Post-op Assessment: Report given to RN and Post -op Vital signs reviewed and stable  Post vital signs: Reviewed and stable  Last Vitals:  Vitals Value Taken Time  BP    Temp    Pulse    Resp    SpO2      Last Pain:  Vitals:   11/23/20 1116  TempSrc:   PainSc: 0-No pain         Complications: No complications documented.

## 2020-11-23 NOTE — Op Note (Signed)
[facility] Patient Name: Patricia Stanton Procedure Date: 11/23/2020 11:03 AM MRN: [id number] Date of Birth: May 24, 1986 Attending MD: Norvel Richards , MD CSN: [id number] Age: 35 Admit Type: Outpatient Procedure:                Upper GI endoscopy Indications:              Epigastric abdominal pain Providers:                Norvel Richards, MD, Crystal Page, [place]                            Risa Grill, Technician Referring MD:              Medicines:                Propofol per Anesthesia Complications:            No immediate complications. Estimated Blood Loss:     Estimated blood loss: none. Procedure:                Pre-Anesthesia Assessment:                           - Prior to the procedure, a History and Physical                            was performed, and patient medications and                            allergies were reviewed. The patient's tolerance of                            previous anesthesia was also reviewed. The risks                            and benefits of the procedure and the sedation                            options and risks were discussed with the patient.                            All questions were answered, and informed consent                            was obtained. Prior Anticoagulants: The patient has                            taken no previous anticoagulant or antiplatelet                            agents. ASA Grade Assessment: III - A patient with                            severe systemic disease. After reviewing the risks  and benefits, the patient was deemed in                            satisfactory condition to undergo the procedure.                           After obtaining informed consent, the endoscope was                            passed under direct vision. Throughout the                            procedure, the patient's blood pressure, pulse, and                            oxygen  saturations were monitored continuously. The                            [id number]-[id number] ([id number]) scope was introduced through the                            mouth, and advanced to the second part of duodenum.                            The upper GI endoscopy was accomplished without                            difficulty. The patient tolerated the procedure                            well. Scope In: 11:22:32 AM Scope Out: 11:26:31 AM Total Procedure Duration: 0 hours 3 minutes 59 seconds  Findings:      The examined esophagus was normal.      A small hiatal hernia was present. Stomach otherwise otherwise appeared      The duodenal bulb and second portion of the duodenum were normal. Impression:               - Normal esophagus.                           - Small hiatal hernia. Otherwise, normal stomach.                           - Normal duodenal bulb and second portion of the                            duodenum.                           - No specimens collected. I suspect abdominal wall                            is the origin of her abdominal pain. Moderate Sedation:      Moderate (conscious) sedation was personally administered by an  anesthesia professional. The following parameters were monitored: oxygen       saturation, heart rate, blood pressure, respiratory rate, EKG, adequacy       of pulmonary ventilation, and response to care. Recommendation:           - Patient has a contact number available for                            emergencies. The signs and symptoms of potential                            delayed complications were discussed with the                            patient. Return to normal activities tomorrow.                            Written discharge instructions were provided to the                            patient.                           - Resume previous diet.                           - Continue present medications.                           - Return to my  office in 3 months. Re-Referral to                            Dr. Arnoldo Morale Procedure Code(s):        --- Professional ---                           [phone], Esophagogastroduodenoscopy, flexible,                            transoral; diagnostic, including collection of                            specimen(s) by brushing or washing, when performed                            (separate procedure) Diagnosis Code(s):        --- Professional ---                           K44.9, Diaphragmatic hernia without obstruction or                            gangrene                           R10.13, Epigastric pain CPT copyright 2019 [employer]. All rights reserved. The codes documented in this report are preliminary and upon coder  review may  be revised to meet current compliance requirements. Cristopher Estimable. Daltyn Degroat, MD Norvel Richards, MD 11/23/2020 11:39:05 AM This report has been signed electronically. Number of Addenda: 0

## 2020-11-23 NOTE — Interval H&P Note (Signed)
History and Physical Interval Note:  11/23/2020 11:14 AM  Patricia Stanton  has presented today for surgery, with the diagnosis of pain of upper abdomen, nausea/vomiting, GERD.  The various methods of treatment have been discussed with the patient and family. After consideration of risks, benefits and other options for treatment, the patient has consented to  Procedure(s) with comments: ESOPHAGOGASTRODUODENOSCOPY (EGD) WITH PROPOFOL (N/A) - PM-diabetic as a surgical intervention.  The patient's history has been reviewed, patient examined, no change in status, stable for surgery.  I have reviewed the patient's chart and labs.  Questions were answered to the patient's satisfaction.     Stephany Poorman  No change.  CT and labs reviewed.  Patient examined.  Pain appears to be emanating from abdominal wall she may have a slight bulge above her umbilicus which is also tender.  I do not think this lady has had pancreatitis recently.  She denies dysphagia.  We will proceed with a diagnostic EGD today.The risks, benefits, limitations, alternatives and imponderables have been reviewed with the patient. Potential for esophageal dilation, biopsy, etc. have also been reviewed.  Questions have been answered. All parties agreeable.   She may ultimately need to follow-up with Dr. Arnoldo Morale in the near future

## 2020-11-27 ENCOUNTER — Other Ambulatory Visit: Payer: Self-pay

## 2020-11-27 ENCOUNTER — Emergency Department (HOSPITAL_COMMUNITY)
Admission: RE | Admit: 2020-11-27 | Discharge: 2020-11-27 | Disposition: A | Discharge status: Home / Self Care | Payer: Medicaid Other | Source: Ambulatory Visit | Attending: Gastroenterology | Admitting: Gastroenterology

## 2020-11-27 ENCOUNTER — Encounter (HOSPITAL_COMMUNITY): Payer: Self-pay

## 2020-11-27 ENCOUNTER — Emergency Department (HOSPITAL_COMMUNITY)
Admission: EM | Admit: 2020-11-27 | Discharge: 2020-11-27 | Disposition: A | Discharge status: Home / Self Care | Payer: Medicaid Other | Attending: Emergency Medicine | Admitting: Emergency Medicine

## 2020-11-27 ENCOUNTER — Telehealth: Payer: Self-pay | Admitting: *Deleted

## 2020-11-27 DIAGNOSIS — J45909 Unspecified asthma, uncomplicated: Secondary | ICD-10-CM | POA: Insufficient documentation

## 2020-11-27 DIAGNOSIS — N182 Chronic kidney disease, stage 2 (mild): Secondary | ICD-10-CM | POA: Insufficient documentation

## 2020-11-27 DIAGNOSIS — F17210 Nicotine dependence, cigarettes, uncomplicated: Secondary | ICD-10-CM | POA: Diagnosis not present

## 2020-11-27 DIAGNOSIS — I129 Hypertensive chronic kidney disease with stage 1 through stage 4 chronic kidney disease, or unspecified chronic kidney disease: Secondary | ICD-10-CM | POA: Insufficient documentation

## 2020-11-27 DIAGNOSIS — K219 Gastro-esophageal reflux disease without esophagitis: Secondary | ICD-10-CM | POA: Diagnosis not present

## 2020-11-27 DIAGNOSIS — R112 Nausea with vomiting, unspecified: Secondary | ICD-10-CM | POA: Diagnosis not present

## 2020-11-27 DIAGNOSIS — Z79899 Other long term (current) drug therapy: Secondary | ICD-10-CM | POA: Diagnosis not present

## 2020-11-27 DIAGNOSIS — E278 Other specified disorders of adrenal gland: Secondary | ICD-10-CM

## 2020-11-27 DIAGNOSIS — Z7951 Long term (current) use of inhaled steroids: Secondary | ICD-10-CM | POA: Diagnosis not present

## 2020-11-27 DIAGNOSIS — D3500 Benign neoplasm of unspecified adrenal gland: Secondary | ICD-10-CM | POA: Diagnosis not present

## 2020-11-27 DIAGNOSIS — Z7984 Long term (current) use of oral hypoglycemic drugs: Secondary | ICD-10-CM | POA: Insufficient documentation

## 2020-11-27 DIAGNOSIS — E1122 Type 2 diabetes mellitus with diabetic chronic kidney disease: Secondary | ICD-10-CM | POA: Insufficient documentation

## 2020-11-27 DIAGNOSIS — R109 Unspecified abdominal pain: Secondary | ICD-10-CM | POA: Diagnosis not present

## 2020-11-27 DIAGNOSIS — G8929 Other chronic pain: Secondary | ICD-10-CM | POA: Insufficient documentation

## 2020-11-27 DIAGNOSIS — K760 Fatty (change of) liver, not elsewhere classified: Secondary | ICD-10-CM | POA: Diagnosis not present

## 2020-11-27 MED ORDER — GADOBUTROL 1 MMOL/ML IV SOLN
10.0000 mL | Freq: Once | INTRAVENOUS | Status: AC | PRN
  Administered 2020-11-27: 10 mL via INTRAVENOUS

## 2020-11-27 NOTE — Telephone Encounter (Signed)
Called patient. She already has an appointment scheduled with Dr. Gala Romney tomorrow for further evaluation of her ongoing abdominal pain. She was seen in the ER today and was told they could do blood work and a CT which she has already had, so she declined. She will see Dr. Gala Romney tomorrow for further evaluation.   I did review her MRI findings with her which shows a benign fat containing left adrenal adenoma. No other acute findings.

## 2020-11-27 NOTE — ED Triage Notes (Signed)
Pt presents to ED with complaints of right flank pain worsening over last month.

## 2020-11-27 NOTE — Discharge Instructions (Addendum)
As we discussed offered repeating CT scan of abdomen and doing labs.  But also do understand you had a CAT scan of your abdomen the end of February and that you are scheduled for MRI to further evaluate the left adrenal mass.  Go to MRI to have your left adrenal mass follow-up follow-up with your doctors and Dr. Gala Romney.

## 2020-11-27 NOTE — Telephone Encounter (Signed)
Patient called in requesting to speak with Patricia Stanton this morning. She would not advise in to what regards and asked for Cyril Mourning to call her.

## 2020-11-27 NOTE — ED Provider Notes (Signed)
[facility] EMERGENCY DEPARTMENT Provider Note   CSN: [id number] Arrival date & time: 11/27/20  0856     History Chief Complaint  Patient presents with  . Flank Pain    Patricia Stanton is a 35 y.o. female.  Patient with complaint of right-sided abdominal pain that is been going on over the last month.  Patient states that the pain seems to be worse here today.  But it is the same pain that she has had for a while.  Patient recently had upper endoscopy done by Dr. Gala Romney.  Without any acute findings other than a small hiatal hernia.  Patient had CT scan of the abdomen and pelvis done the end of February without any acute findings.  However there was an enlarged meant of the left adrenal gland that had increased in size.  So they were planning to do MRI of that and that is scheduled for today at 12 noon.  The upper endoscopy was just done on March 10.  And the CT scan of the abdomen was done February 28.  Dr. Roseanne Kaufman assessment was that he thought her abdominal pain was abdominal wall.  But did not have specifics.        Past Medical History:  Diagnosis Date  . Adrenal tumor   . Anxiety   . Arthritis    left knee  . Asthma   . Chronic abdominal pain   . Chronic headaches   . Depression   . Diabetes mellitus type 2 in obese ([place]) 11/20/2016  . Endometriosis   . Gastroesophageal reflux disease 11/09/2020  . HLD (hyperlipidemia) 02/17/2017  . Hypertension   . 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 Mood disorder ([place])   . Obesity   . Ovarian cyst   . Tobacco abuse     Patient Active Problem List   Diagnosis Date Noted  . Gastroesophageal reflux disease 11/09/2020  . S/P lumbar laminectomy 05/21/2019  . Simple chronic bronchitis ([place]) 03/25/2019  . 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 Chronic abdominal pain 03/25/2019  . Abnormal bowel movement  03/25/2019  . Leukocytosis 11/19/2018  . Noncompliance 06/03/2017  . Hyperlipidemia associated with type 2 diabetes mellitus ([place]) 02/17/2017  . Major depressive disorder, recurrent episode, moderate ([place]) 02/03/2017  . Vitamin D deficiency 11/20/2016  . Diabetes mellitus type 2 in obese ([place]) 11/20/2016  . PTSD (post-traumatic stress disorder) 11/19/2016  . Elevated blood-pressure reading without diagnosis of hypertension 11/19/2016  . History of hernia surgery 11/19/2016  . Pulmonary nodule/lesion, solitary 11/19/2016  . Mucosal abnormality of stomach   . Pain of upper abdomen 12/19/2015  . Non-intractable vomiting 12/19/2015  . Constipation 12/19/2015  . Tobacco abuse 06/06/2013  . Morbid obesity ([place]) 06/06/2013    Past Surgical History:  Procedure Laterality Date  . CESAREAN SECTION  2008   [place]  . CHOLECYSTECTOMY N/A 11/25/2014   Procedure: LAPAROSCOPIC CHOLECYSTECTOMY;  Surgeon: Aviva Signs Md, MD;  Location: [facility];  Service: General;  Laterality: N/A;  . ESOPHAGOGASTRODUODENOSCOPY N/A 12/21/2015   Dr. Gala Romney: normal esophagus, non-bleeding erosive gastropathy, normal second portion of duodenum. Reactive gastropathy/chemical gastritis, negative H.pylori  . FEMUR FRACTURE SURGERY Left 1995   tree fell on her  . HERNIA REPAIR    . INCISIONAL HERNIA REPAIR N/A 01/29/2016   Procedure: Fatima Blank HERNIORRHAPHY WITH MESH;  Surgeon: Aviva Signs, MD;  Location: [facility];  Service:  General;  Laterality: N/A;  . INSERTION OF MESH  01/29/2016   Procedure: INSERTION OF MESH;  Surgeon: Aviva Signs, MD;  Location: [facility];  Service: General;;  . KNEE ARTHROSCOPY Left   . LUMBAR LAMINECTOMY/DECOMPRESSION MICRODISCECTOMY Right 05/21/2019   Procedure: Microdiscectomy - Lumbar four-Lumbar five - right;  Surgeon: Eustace Moore, MD;  Location: [facility];  Service: Neurosurgery;  Laterality: Right;  . TUBAL LIGATION       OB History   No obstetric history on file.     Family History  Problem  Relation Age of Onset  . Hypertension Mother   . Hyperlipidemia Mother   . Fibromyalgia Mother   . Other Mother        degenerative disc disease  . Arthritis Mother   . Kidney disease Mother   . Diabetes Father   . Hypertension Father   . Hyperlipidemia Father   . Heart disease Father 51  . Heart attack Father   . Stroke Father   . Hyperlipidemia Brother   . Hypertension Brother   . Aneurysm Maternal Grandmother        AAA  . Kidney disease Maternal Grandmother   . Kidney disease Maternal Grandfather   . Aneurysm Paternal Grandmother        AAA  . Kidney disease Paternal Grandmother   . Kidney disease Paternal Grandfather   . Colon cancer Neg Hx   . Inflammatory bowel disease Neg Hx     Social History   Tobacco Use  . Smoking status: Current Every Day Smoker    Packs/day: 0.50    Years: 11.00    Pack years: 5.50    Types: Cigarettes    Start date: 09/17/2003  . Smokeless tobacco: Never Used  Vaping Use  . Vaping Use: Never used  Substance Use Topics  . Alcohol use: No  . Drug use: No    Home Medications Prior to Admission medications   Medication Sig Start Date End Date Taking? Authorizing Provider  albuterol (PROVENTIL) (2.5 MG/3ML) 0.083% nebulizer solution Take 3 mLs (2.5 mg total) by nebulization every 6 (six) hours as needed for wheezing or shortness of breath. 06/07/20   Sharion Balloon, FNP  albuterol (VENTOLIN HFA) 108 (90 Base) MCG/ACT inhaler Inhale 2 puffs into the lungs every 6 (six) hours as needed for wheezing or shortness of breath. 06/07/20   Sharion Balloon, FNP  budesonide-formoterol (SYMBICORT) 80-4.5 MCG/ACT inhaler Inhale 2 puffs into the lungs 2 (two) times daily. 05/11/20   Sharion Balloon, FNP  cetirizine (ZYRTEC) 10 MG tablet Take 1 tablet (10 mg total) by mouth daily. 06/08/20   Evelina Dun A, FNP  escitalopram (LEXAPRO) 10 MG tablet Take 1 tablet (10 mg total) by mouth daily. 09/18/20   Norman Clay, MD  gabapentin (NEURONTIN) 600 MG  tablet Take 600 mg by mouth 3 (three) times daily. 11/05/20   [provider]  lisinopril-hydrochlorothiazide (ZESTORETIC) 10-12.5 MG tablet Take 1 tablet by mouth daily. 06/08/20   Evelina Dun A, FNP  lovastatin (MEVACOR) 20 MG tablet Take 1 tablet (20 mg total) by mouth at bedtime. 06/08/20   Sharion Balloon, FNP  metFORMIN (GLUCOPHAGE XR) 500 MG 24 hr tablet Take 2 tablets (1,000 mg total) by mouth 2 (two) times daily. 06/08/20   Sharion Balloon, FNP  methocarbamol (ROBAXIN) 500 MG tablet Take 500 mg by mouth 3 (three) times daily as needed for muscle spasms.  08/18/18   [provider]  ondansetron (  ZOFRAN) 4 MG tablet Take 1 tablet (4 mg total) by mouth every 8 (eight) hours as needed for nausea or vomiting. 11/09/20   Erenest Rasher, PA-C  oxyCODONE-acetaminophen (PERCOCET/ROXICET) 5-325 MG tablet Take 1 tablet by mouth every 4 (four) hours as needed for moderate pain. 05/21/19   Eustace Moore, MD  pantoprazole (PROTONIX) 40 MG tablet Take 1 tablet (40 mg total) by mouth 2 (two) times daily. 11/09/20   Erenest Rasher, PA-C  prazosin (MINIPRESS) 1 MG capsule Total of 3 mg at night. Take along with 2 mg tab Patient taking differently: Take 1 mg by mouth at bedtime. Total of 3 mg at night. Take along with 2 mg tab 09/18/20   Norman Clay, MD  prazosin (MINIPRESS) 2 MG capsule Take 1 capsule (2 mg total) by mouth at bedtime. 10/24/20   Norman Clay, MD    Allergies    Tramadol  Review of Systems   Review of Systems  Constitutional: Negative for chills and fever.  HENT: Negative for congestion, rhinorrhea and sore throat.   Eyes: Negative for visual disturbance.  Respiratory: Negative for cough and shortness of breath.   Cardiovascular: Negative for chest pain and leg swelling.  Gastrointestinal: Positive for abdominal pain and nausea. Negative for diarrhea and vomiting.  Genitourinary: Negative for dysuria.  Musculoskeletal: Negative for back pain and neck pain.  Skin:  Negative for rash.  Neurological: Negative for dizziness, light-headedness and headaches.  Hematological: Does not bruise/bleed easily.  Psychiatric/Behavioral: Negative for confusion.    Physical Exam Updated Vital Signs BP (!) 185/111 (BP Location: Left Wrist)   Pulse (!) 103   Temp 97.8 F (36.6 C) (Oral)   Resp 20   Ht 1.651 m (5\' 5" )   Wt 133.8 kg   LMP 11/21/2020   SpO2 98%   BMI 49.09 kg/m   Physical Exam Vitals and nursing note reviewed.  Constitutional:      General: She is not in acute distress.    Appearance: Normal appearance. She is well-developed.  HENT:     Head: Normocephalic and atraumatic.  Eyes:     Extraocular Movements: Extraocular movements intact.     Conjunctiva/sclera: Conjunctivae normal.     Pupils: Pupils are equal, round, and reactive to light.  Cardiovascular:     Rate and Rhythm: Normal rate and regular rhythm.     Heart sounds: No murmur heard.   Pulmonary:     Effort: Pulmonary effort is normal. No respiratory distress.     Breath sounds: Normal breath sounds.  Abdominal:     Palpations: Abdomen is soft.     Tenderness: There is abdominal tenderness. There is no guarding.     Comments: Mild tenderness right flank area.  No guarding  Musculoskeletal:        General: Normal range of motion.     Cervical back: Normal range of motion and neck supple.  Skin:    General: Skin is warm and dry.  Neurological:     General: No focal deficit present.     Mental Status: She is alert and oriented to person, place, and time.     Cranial Nerves: No cranial nerve deficit.     Sensory: No sensory deficit.     ED Results / Procedures / Treatments   Labs (all labs ordered are listed, but only abnormal results are displayed) Labs Reviewed - No data to display  EKG None  Radiology No results found.  Procedures Procedures  Medications Ordered in ED Medications - No data to display  ED Course  I have reviewed the triage vital signs  and the nursing notes.  Pertinent labs & imaging results that were available during my care of the patient were reviewed by me and considered in my medical decision making (see chart for details).    MDM Rules/Calculators/A&P                          In discussion with the patient she decided she did not want to have repeat labs or repeat CT scan since there was no acute findings here recently.  And that she has her MRI today at noon.  Did offer to go ahead and do labs to see if there was any acute changes and to repeat the CT scan.  But patient felt symptoms were similar to what has been ongoing now for a period of time and was ongoing at the time that they did the upper endoscopy and the CT scan the end of February.  Patient opted not to do lab work or to have the CT scan done again.  She is due to be over for MRI of her abdomen to evaluate the left adrenal enlargement at 1130 and that is MRI is scheduled for 12 noon.  Patient nontoxic no acute distress.  Vital signs only significant for heart rate of 103 and elevated blood pressure of 145/72.  No fever     Final Clinical Impression(s) / ED Diagnoses Final diagnoses:  Chronic abdominal pain    Rx / DC Orders ED Discharge Orders    None       Fredia Sorrow, MD 11/27/20 1011

## 2020-11-28 ENCOUNTER — Other Ambulatory Visit: Payer: Self-pay

## 2020-11-28 ENCOUNTER — Encounter: Payer: Self-pay | Admitting: Internal Medicine

## 2020-11-28 ENCOUNTER — Ambulatory Visit: Payer: Medicaid Other | Admitting: Internal Medicine

## 2020-11-28 VITALS — BP 155/98 | HR 99 | Temp 97.0°F | Ht 65.0 in | Wt 293.6 lb

## 2020-11-28 DIAGNOSIS — M869 Osteomyelitis, unspecified: Secondary | ICD-10-CM

## 2020-11-28 DIAGNOSIS — K5900 Constipation, unspecified: Secondary | ICD-10-CM

## 2020-11-28 DIAGNOSIS — E1169 Type 2 diabetes mellitus with other specified complication: Secondary | ICD-10-CM

## 2020-11-28 DIAGNOSIS — Z79899 Other long term (current) drug therapy: Secondary | ICD-10-CM

## 2020-11-28 NOTE — Patient Instructions (Signed)
Labs today :  CBC with differential, Hgb A1C, hepatic profile, Lipase  Begin Linzess 145 daily  Office visit in 4 weeks (Kristen Jodi Mourning)  Continue Protonix 40 mg twice daily  You may still need to see Dr. Arnoldo Morale  Further recommendations to follow

## 2020-11-28 NOTE — Progress Notes (Signed)
Primary Care Physician:  Sharion Balloon, FNP Primary Gastroenterologist:  Dr. Gala Romney  Pre-Procedure History & Physical: HPI:  Patricia [place] is a 35 y.o. female here for follow-up of vague right upper quadrant abdominal pain.  Recent EGD and CT did not demonstrate any significant findings.  Small hiatal hernia.  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  There is concerned about recurrent incisional hernia although nothing obvious on physical exam or imaging.  Constipation a significant issue  May go 4 to 5 days without a bowel movement  - says Linzess was helping her quite a bit (with associated improvement in abdominal pain) but she stopped taking this medicine because she ran out.  Has not had any melena or rectal bleeding.  Did not like MiraLAX.  Liked Linzess the most but she said after she took it for several days she started having diarrhea and that was a problem (290 mcg). Has not seen Dr. Arnoldo Morale yet for an abdominal wall exam assessment. Chronic leukocytosis white count 20,000 recently.  Seen previously by hematologist.  Work-up incomplete as I understand as she refused bone marrow biopsy / aspirate. Slightly elevated lipase previously with out morphological changes on cross-sectional imaging to go with pancreatitis.  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 Gala Romney: normal esophagus, non-bleeding erosive gastropathy, normal second portion of duodenum. Reactive gastropathy/chemical gastritis, negative H.pylori   FEMUR FRACTURE SURGERY Left 1995   tree fell on her   [place] N/A 01/29/2016   Procedure: Fatima Blank HERNIORRHAPHY WITH MESH;  Surgeon: Aviva Signs, MD;  Location: [facility];  Service: General;  Laterality: N/A;   INSERTION OF MESH  01/29/2016   Procedure: INSERTION OF MESH;  Surgeon: Aviva Signs, MD;  Location: [facility];  Service: General;;   KNEE ARTHROSCOPY Left    LUMBAR LAMINECTOMY/DECOMPRESSION MICRODISCECTOMY Right 05/21/2019   Procedure: Microdiscectomy - Lumbar four-Lumbar five - right;  Surgeon: Eustace Moore, MD;  Location: [facility];  Service: Neurosurgery;  Laterality: Right;   TUBAL LIGATION      Prior to Admission medications   Medication Sig Start Date End Date Taking? Authorizing Provider  albuterol (PROVENTIL) (2.5 MG/3ML) 0.083% nebulizer solution Take 3 mLs (2.5 mg total) by nebulization every 6 (six) hours as needed for wheezing or shortness of breath. 06/07/20  Yes Hawks, Christy A, FNP  albuterol (VENTOLIN HFA) 108 (90 Base) MCG/ACT inhaler Inhale 2 puffs into the lungs every 6 (six) hours as needed for  wheezing or shortness of breath. 06/07/20  Yes Hawks, Christy A, FNP  budesonide-formoterol (SYMBICORT) 80-4.5 MCG/ACT inhaler Inhale 2 puffs into the lungs 2 (two) times daily. 05/11/20  Yes Hawks, Christy A, FNP  cetirizine (ZYRTEC) 10 MG tablet Take 1 tablet (10 mg total) by mouth daily. 06/08/20  Yes Hawks, Christy A, FNP  escitalopram (LEXAPRO) 10 MG tablet Take 1 tablet (10 mg total) by mouth daily. 09/18/20  Yes Norman Clay, MD   gabapentin (NEURONTIN) 600 MG tablet Take 600 mg by mouth 3 (three) times daily. 11/05/20  Yes [provider]  lisinopril-hydrochlorothiazide (ZESTORETIC) 10-12.5 MG tablet Take 1 tablet by mouth daily. 06/08/20  Yes Hawks, Christy A, FNP  lovastatin (MEVACOR) 20 MG tablet Take 1 tablet (20 mg total) by mouth at bedtime. 06/08/20  Yes Hawks, Christy A, FNP  metFORMIN (GLUCOPHAGE XR) 500 MG 24 hr tablet Take 2 tablets (1,000 mg total) by mouth 2 (two) times daily. 06/08/20  Yes Hawks, Christy A, FNP  methocarbamol (ROBAXIN) 500 MG tablet Take 500 mg by mouth 3 (three) times daily as needed for muscle spasms.  08/18/18  Yes [provider]  ondansetron (ZOFRAN) 4 MG tablet Take 1 tablet (4 mg total) by mouth every 8 (eight) hours as needed for nausea or vomiting. 11/09/20  Yes Erenest Rasher, PA-C  oxyCODONE-acetaminophen (PERCOCET/ROXICET) 5-325 MG tablet Take 1 tablet by mouth every 4 (four) hours as needed for moderate pain. 05/21/19  Yes Eustace Moore, MD  pantoprazole (PROTONIX) 40 MG tablet Take 1 tablet (40 mg total) by mouth 2 (two) times daily. 11/09/20  Yes Aliene Altes S, PA-C  prazosin (MINIPRESS) 1 MG capsule Total of 3 mg at night. Take along with 2 mg tab Patient taking differently: Take 1 mg by mouth at bedtime. Total of 3 mg at night. Take along with 2 mg tab 09/18/20  Yes Hisada, Elie Goody, MD  prazosin (MINIPRESS) 2 MG capsule Take 1 capsule (2 mg total) by mouth at bedtime. 10/24/20  Yes Norman Clay, MD    Allergies as of 11/28/2020 - Review Complete 11/28/2020  Allergen Reaction Noted   Tramadol Other (See Comments) 05/21/2019    Family History  Problem Relation Age of Onset   Hypertension Mother    Hyperlipidemia Mother    Fibromyalgia Mother    Other Mother        degenerative disc disease   Arthritis Mother    Kidney disease Mother    Diabetes Father    Hypertension Father    Hyperlipidemia Father    Heart disease Father 52   Heart attack  Father    Stroke Father    Hyperlipidemia Brother    Hypertension Brother    Aneurysm Maternal Grandmother        AAA   Kidney disease Maternal Grandmother    Kidney disease Maternal Grandfather    Aneurysm Paternal Grandmother        AAA   Kidney disease Paternal Grandmother    Kidney disease Paternal Grandfather    Colon cancer Neg Hx    Inflammatory bowel disease Neg Hx     Social History   Socioeconomic History   Marital status: Divorced    Spouse name: Not on file   Number of children: 1   Years of education: 14   Highest education level: Not on file  Occupational History   Occupation: disability  Tobacco Use   Smoking status: Current Every Day Smoker    Packs/day: 0.50  Years: 11.00    Pack years: 5.50    Types: Cigarettes    Start date: 09/17/2003   Smokeless tobacco: Never Used  Vaping Use   Vaping Use: Never used  Substance and Sexual Activity   Alcohol use: No   Drug use: No   Sexual activity: Yes    Birth control/protection: Surgical    Comment: BTL  Other Topics Concern   Not on file  Social History Narrative   Disabled from nursing   Lives at home with daughter [name]   Social Determinants of Health   Financial Resource Strain: Not on file  Food Insecurity: Not on file  Transportation Needs: Not on file  Physical Activity: Not on file  Stress: Not on file  Social Connections: Not on file  Intimate Partner Violence: Not on file    Review of Systems: See HPI, otherwise negative ROS  Physical Exam: Companied by her husband. BP (!) 155/98    Pulse 99    Temp (!) 97 F (36.1 C)    Ht $R'5\' 5"'jd$  (1.651 m)    Wt 293 lb 9.6 oz (133.2 kg)    LMP 11/21/2020    BMI 48.86 kg/m  General:   Alert,  pleasant and cooperative in NAD.  Accompanied by her husband. Lungs:  Clear throughout to auscultation.   No wheezes, crackles, or rhonchi. No acute distress. Heart:  Regular rate and rhythm; no murmurs, clicks, rubs,  or  gallops. Abdomen: Obese.  Positive bowel sounds soft nontender no obvious abdominal wall defects or organomegaly.  Pulses:  Normal pulses noted. Extremities:  Without clubbing or edema.  Impression/Plan: 35 year old morbidly obese lady with right upper quadrant abdominal pain temporally related to constipation.  Gallbladder is gone.  Cross-sectional imaging recently MRI and CT failed to demonstrate any abnormality to explain her symptoms.  Small hiatal hernia on EGD  Reflux well controlled on twice daily PPI  I suspect constipation is the culprit here more than anything else.   I suppose she could have an occult incisional hernia but it must be a small defect if at all present.    Persisting leukocytosis mildly elevated lipase-the latter likely nonspecific  Recommendations   Labs today :  CBC with differential, Hgb A1C, hepatic profile, Lipase  Begin Linzess 145 daily -samples provided x3 weeks  Office visit in 4 weeks (Aliene Altes)  Continue Protonix 40 mg twice daily  Patient may still still need to see Dr. Arnoldo Morale  Further recommendations to follow   Notice: This dictation was prepared with Dragon dictation along with smaller phrase technology. Any transcriptional errors that result from this process are unintentional and may not be corrected upon review.

## 2020-11-29 ENCOUNTER — Other Ambulatory Visit: Payer: Self-pay

## 2020-11-29 ENCOUNTER — Ambulatory Visit: Payer: Medicaid Other | Admitting: Nurse Practitioner

## 2020-11-29 ENCOUNTER — Encounter: Payer: Self-pay | Admitting: Nurse Practitioner

## 2020-11-29 VITALS — BP 148/100 | HR 97 | Temp 97.7°F | Ht 65.0 in | Wt 292.0 lb

## 2020-11-29 DIAGNOSIS — E1169 Type 2 diabetes mellitus with other specified complication: Secondary | ICD-10-CM

## 2020-11-29 DIAGNOSIS — E669 Obesity, unspecified: Secondary | ICD-10-CM

## 2020-11-29 DIAGNOSIS — E1122 Type 2 diabetes mellitus with diabetic chronic kidney disease: Secondary | ICD-10-CM

## 2020-11-29 DIAGNOSIS — Z79899 Other long term (current) drug therapy: Secondary | ICD-10-CM

## 2020-11-29 DIAGNOSIS — I129 Hypertensive chronic kidney disease with stage 1 through stage 4 chronic kidney disease, or unspecified chronic kidney disease: Secondary | ICD-10-CM | POA: Diagnosis not present

## 2020-11-29 LAB — HEMOGLOBIN A1C
Hgb A1c MFr Bld: 10 % of total Hgb — ABNORMAL HIGH (ref <5.7)
Mean Plasma Glucose: 240 mg/dL
eAG (mmol/L): 13.3 mmol/L

## 2020-11-29 LAB — HEPATIC FUNCTION PANEL
AG Ratio: 1.4 (calc) (ref 1.0–2.5)
ALT: 31 U/L — ABNORMAL HIGH (ref 6–29)
AST: 27 U/L (ref 10–30)
Albumin: 4.1 g/dL (ref 3.6–5.1)
Alkaline phosphatase (APISO): 93 U/L (ref 31–125)
Bilirubin, Direct: 0.1 mg/dL (ref 0.0–0.2)
Globulin: 2.9 g/dL (calc) (ref 1.9–3.7)
Indirect Bilirubin: 0.4 mg/dL (calc) (ref 0.2–1.2)
Total Bilirubin: 0.5 mg/dL (ref 0.2–1.2)
Total Protein: 7 g/dL (ref 6.1–8.1)

## 2020-11-29 LAB — CBC WITH DIFFERENTIAL/PLATELET

## 2020-11-29 LAB — LIPASE: Lipase: 28 U/L (ref 7–60)

## 2020-11-29 MED ORDER — LISINOPRIL-HYDROCHLOROTHIAZIDE 20-25 MG PO TABS: 1.0000 | ORAL_TABLET | Freq: Every day | ORAL | 1 refills | Status: DC

## 2020-11-29 MED ORDER — RYBELSUS 3 MG PO TABS: 3.0000 mg | ORAL_TABLET | Freq: Every day | ORAL | 1 refills | Status: DC

## 2020-11-29 NOTE — Patient Instructions (Signed)
Hypertension, Adult Hypertension is another name for high blood pressure. High blood pressure forces your heart to work harder to pump blood. This can cause problems over time. There are two numbers in a blood pressure reading. There is a top number (systolic) over a bottom number (diastolic). It is best to have a blood pressure that is below 120/80. Healthy choices can help lower your blood pressure, or you may need medicine to help lower it. What are the causes? The cause of this condition is not known. Some conditions may be related to high blood pressure. What increases the risk?  Smoking.  Having type 2 diabetes mellitus, high cholesterol, or both.  Not getting enough exercise or physical activity.  Being overweight.  Having too much fat, sugar, calories, or salt (sodium) in your diet.  Drinking too much alcohol.  Having long-term (chronic) kidney disease.  Having a family history of high blood pressure.  Age. Risk increases with age.  Race. You may be at higher risk if you are African American.  Gender. Men are at higher risk than women before age 45. After age 65, women are at higher risk than men.  Having obstructive sleep apnea.  Stress. What are the signs or symptoms?  High blood pressure may not cause symptoms. Very high blood pressure (hypertensive crisis) may cause: ? Headache. ? Feelings of worry or nervousness (anxiety). ? Shortness of breath. ? Nosebleed. ? A feeling of being sick to your stomach (nausea). ? Throwing up (vomiting). ? Changes in how you see. ? Very bad chest pain. ? Seizures. How is this treated?  This condition is treated by making healthy lifestyle changes, such as: ? Eating healthy foods. ? Exercising more. ? Drinking less alcohol.  Your health care provider may prescribe medicine if lifestyle changes are not enough to get your blood pressure under control, and if: ? Your top number is above 130. ? Your bottom number is above  80.  Your personal target blood pressure may vary. Follow these instructions at home: Eating and drinking  If told, follow the DASH eating plan. To follow this plan: ? Fill one half of your plate at each meal with fruits and vegetables. ? Fill one fourth of your plate at each meal with whole grains. Whole grains include whole-wheat pasta, brown rice, and whole-grain bread. ? Eat or drink low-fat dairy products, such as skim milk or low-fat yogurt. ? Fill one fourth of your plate at each meal with low-fat (lean) proteins. Low-fat proteins include fish, chicken without skin, eggs, beans, and tofu. ? Avoid fatty meat, cured and processed meat, or chicken with skin. ? Avoid pre-made or processed food.  Eat less than 1,500 mg of salt each day.  Do not drink alcohol if: ? Your doctor tells you not to drink. ? You are pregnant, may be pregnant, or are planning to become pregnant.  If you drink alcohol: ? Limit how much you use to:  0-1 drink a day for women.  0-2 drinks a day for men. ? Be aware of how much alcohol is in your drink. In the [place] one drink equals one 12 oz bottle of beer (355 mL), one 5 oz glass of wine (148 mL), or one 1 oz glass of hard liquor (44 mL).   Lifestyle  Work with your doctor to stay at a healthy weight or to lose weight. Ask your doctor what the best weight is for you.  Get at least 30 minutes of exercise most   days of the week. This may include walking, swimming, or biking.  Get at least 30 minutes of exercise that strengthens your muscles (resistance exercise) at least 3 days a week. This may include lifting weights or doing Pilates.  Do not use any products that contain nicotine or tobacco, such as cigarettes, e-cigarettes, and chewing tobacco. If you need help quitting, ask your doctor.  Check your blood pressure at home as told by your doctor.  Keep all follow-up visits as told by your doctor. This is important.   Medicines  Take over-the-counter  and prescription medicines only as told by your doctor. Follow directions carefully.  Do not skip doses of blood pressure medicine. The medicine does not work as well if you skip doses. Skipping doses also puts you at risk for problems.  Ask your doctor about side effects or reactions to medicines that you should watch for. Contact a doctor if you:  Think you are having a reaction to the medicine you are taking.  Have headaches that keep coming back (recurring).  Feel dizzy.  Have swelling in your ankles.  Have trouble with your vision. Get help right away if you:  Get a very bad headache.  Start to feel mixed up (confused).  Feel weak or numb.  Feel faint.  Have very bad pain in your: ? Chest. ? Belly (abdomen).  Throw up more than once.  Have trouble breathing. Summary  Hypertension is another name for high blood pressure.  High blood pressure forces your heart to work harder to pump blood.  For most people, a normal blood pressure is less than 120/80.  Making healthy choices can help lower blood pressure. If your blood pressure does not get lower with healthy choices, you may need to take medicine. This information is not intended to replace advice given to you by your health care provider. Make sure you discuss any questions you have with your health care provider. Document Revised: 05/13/2018 Document Reviewed: 05/13/2018 Elsevier Patient Education  [place]. Diabetes Insipidus Diabetes insipidus (DI) is a rare condition that causes the body to produce more urine than normal. This leads to thirst and low fluid in the body (dehydration). In this condition, the urine is made mostly of water, or dilute urine. DI affects mostly adults, but it can happen at any age. There are four types of DI:  Central DI. This is the most common type.  Dipsogenic DI.  Nephrogenic DI.  Gestational DI. The most common forms of this condition are caused by a decrease in  the production of the hormone that regulates urine output (antidiuretic hormone), or the body's resistance to this hormone. This condition is not related to type 1 or type 2 diabetes mellitus. What are the causes? Central DI is caused by damage to the pituitary gland or hypothalamus in the brain. Dipsogenic DI is caused by a defect in the thirst mechanism in the brain. This defect causes you to drink too much fluid. These may result from:  Brain surgery.  Infection.  Inflammation.  Brain tumor.  Head injury. Nephrogenic DI is caused by the kidneys not responding to the antidiuretic hormone in the body. This may result from:  Chronic kidney disease (CKD).  Certain medicines, such as lithium.  Low potassium levels.  High calcium levels. Gestational DI is rare and is caused by the antidiuretic hormone that has stopped working properly.   What are the signs or symptoms? Symptoms of this condition include:  Excessive urination.  This means urinating more than 10 cups (2.4 L) during a period of 24 hours.  Excessive thirst.  Too much nighttime urination (nocturia).  Nausea.  Diarrhea. How is this diagnosed? This condition may be diagnosed based on:  Your medical history.  A physical exam.  Blood tests.  Urine tests.  A water deprivation test. During this test, you will stop drinking fluids for a period of time and your blood and urine will be checked regularly.  An MRI. How is this treated? Once your specific type of diabetes insipidus is diagnosed, treatment may include one or more of the following:  Increasing or limiting your fluid intake.  Taking medicines that contain artificial (synthetic) versions of the antidiuretic hormone.  Stopping certain medicines that you take.  Correcting the balance of minerals (electrolytes) in your body.  Changing your diet. You may be put on a low-protein and low-sodium diet. You may need to visit your health care provider  regularly to make sure your condition is being treated properly. You may also need to work with providers who specialize in:  Kidney problems (nephrologist).  Hormone disorders (endocrinologist). Follow these instructions at home: Eating and drinking  Follow instructions from your health care provider about how much fluid and water to drink. You may be directed to drink more fluids and water, or to limit how much fluid and water you drink.  Follow instructions from your health care provider about eating or drinking restrictions. General instructions  Take over-the-counter and prescription medicines only as told by your health care provider.  If directed, monitor your risk of dehydration in extreme heat.  Carry a medical alert card or wear medical alert jewelry.  Keep all follow-up visits as told by your health care provider. This is important. ? You may need to visit your health care provider regularly to make sure your condition is being treated properly.   Contact a health care provider if:  You continue to have symptoms after treatment. Get help right away if:  You have extreme thirst.  You have symptoms of severe dehydration, such as rapid heart rate, muscle cramps, or confusion. Summary  Diabetes insipidus (DI) is a rare condition that causes the body to produce more urine than normal, which leads to thirst and dehydration.  Follow instructions from your health care provider about eating or drinking restrictions.  Treatment may include increasing or limiting your fluid intake and correcting the balance of minerals (electrolytes) in your body.  Get help right away if you have symptoms of severe dehydration, such as rapid heart rate, muscle cramps, or confusion. This information is not intended to replace advice given to you by your health care provider. Make sure you discuss any questions you have with your health care provider. Document Revised: 10/06/2019 Document  Reviewed: 10/06/2019 Elsevier Patient Education  2021 [employer].

## 2020-11-29 NOTE — Assessment & Plan Note (Addendum)
Diabetes mellitus not well controlled.  A1c 10, patient is currently taking only Metformin 500 mg 2 tablets by mouth daily. Started patient on Rybelsus 3 mg.  Education provided to patient on the importance of weight loss, healthy/diabetic diet.  In printed handouts given.  Follow-up 3 months with PCP/repeat A1c and labs. Patient verbalized understanding. Sample given to patient, Rx sent to pharmacy. Patient knows to follow-up with worsening unresolved symptoms.

## 2020-11-29 NOTE — Assessment & Plan Note (Signed)
Hypertension not well controlled on current medication lisinopril-hydrochlorothiazide 10-12.5 mg daily.  Advised patient to increase his dose by taking 2 tablets daily and follow-up in 2 weeks.  Patient verbalized understanding.  Continue low-sodium heart healthy diet with weight loss and exercise.

## 2020-11-29 NOTE — Progress Notes (Signed)
Established Patient Office Visit  Subjective:  Patient ID: Patricia Stanton, female    DOB: [birth year]  Age: 35 y.o. MRN: [id number]  CC:  Chief Complaint  Patient presents with  . Diabetes    HPI Patricia Stanton presents for Pt presents for follow up of hypertension. Patient was diagnosed in 03/25/2019 the patient is tolerating the medication well without side effects.  Blood pressure is not therapeutic.  Compliance with treatment has been fair; including not taking medication as directed , patient tries to maintains a healthy diet and regular exercise regimen as tolerated, and tries to follow-up as scheduled.  Current medication lisinopril-hydrochlorothiazide 10-12.5 mg daily.  Diabetes Mellitus Type II, Follow-up: Patient here for follow-up of Type 2 diabetes mellitus.  Current symptoms/problems include none and have been unchanged. Symptoms have been present for 3 years.  Known diabetic complications: none Cardiovascular risk factors: diabetes mellitus, dyslipidemia, hypertension and obesity (BMI >= 30 kg/m2) Current diabetic medications include oral agent (monotherapy): Glucophage [Metformin].   Eye exam current (within one year): unknown Weight trend: fluctuating a bit Prior visit with dietician: no Current diet: Regular  Current exercise: none  Current monitoring regimen: none Home blood sugar records: Patient does not have a blood sugar monitor Any episodes of hypoglycemia? no  Is She on ACE inhibitor or angiotensin II receptor blocker?  Yes  lisinopril (Zestril) Hydrochlorothiazide   Past Medical History:  Diagnosis Date  . Adrenal tumor   . Anxiety   . Arthritis    left knee  . Asthma   . Chronic abdominal pain   . Chronic headaches   . Depression   . Diabetes mellitus type 2 in obese ([place]) 11/20/2016  . Endometriosis   . Gastroesophageal reflux disease 11/09/2020  . HLD (hyperlipidemia) 02/17/2017  . Hypertension   . 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 Mood disorder ([place])   . Obesity   . Ovarian cyst   . Tobacco abuse     Past Surgical History:  Procedure Laterality Date  . CESAREAN SECTION  2008   [place]  . CHOLECYSTECTOMY N/A 11/25/2014   Procedure: LAPAROSCOPIC CHOLECYSTECTOMY;  Surgeon: Aviva Signs Md, MD;  Location: [facility];  Service: General;  Laterality: N/A;  . ESOPHAGOGASTRODUODENOSCOPY N/A 12/21/2015   Dr. Gala Romney: normal esophagus, non-bleeding erosive gastropathy, normal second portion of duodenum. Reactive gastropathy/chemical gastritis, negative H.pylori  . ESOPHAGOGASTRODUODENOSCOPY (EGD) WITH PROPOFOL N/A 11/23/2020   Procedure: ESOPHAGOGASTRODUODENOSCOPY (EGD) WITH PROPOFOL;  Surgeon: Daneil Dolin, MD;  [facility];  Service: Endoscopy;  Laterality: N/A;  PM-diabetic  . FEMUR FRACTURE SURGERY Left 1995   tree fell on her  . HERNIA REPAIR    . INCISIONAL HERNIA REPAIR N/A 01/29/2016   Procedure: Fatima Blank HERNIORRHAPHY WITH MESH;  Surgeon: Aviva Signs, MD;  Location: [facility];  Service: General;  Laterality: N/A;  . INSERTION OF MESH  01/29/2016   Procedure: INSERTION OF MESH;  Surgeon: Aviva Signs, MD;  Location: [facility];  Service: General;;  . KNEE ARTHROSCOPY Left   . LUMBAR LAMINECTOMY/DECOMPRESSION MICRODISCECTOMY Right 05/21/2019   Procedure: Microdiscectomy - Lumbar four-Lumbar five - right;  Surgeon: Eustace Moore, MD;  Location: [facility];  Service: Neurosurgery;  Laterality: Right;  . TUBAL LIGATION      Family History  Problem Relation Age of Onset  . Hypertension Mother   . Hyperlipidemia Mother   . Fibromyalgia Mother   . Other Mother  degenerative disc disease  . Arthritis Mother   . Kidney disease Mother   . Diabetes Father   . Hypertension Father   . Hyperlipidemia Father   . Heart disease Father 36  . Heart attack Father   . Stroke Father   . Hyperlipidemia Brother   . Hypertension Brother   . Aneurysm Maternal  Grandmother        AAA  . Kidney disease Maternal Grandmother   . Kidney disease Maternal Grandfather   . Aneurysm Paternal Grandmother        AAA  . Kidney disease Paternal Grandmother   . Kidney disease Paternal Grandfather   . Colon cancer Neg Hx   . Inflammatory bowel disease Neg Hx     Social History   Socioeconomic History  . Marital status: Divorced    Spouse name: Not on file  . Number of children: 1  . Years of education: 22  . Highest education level: Not on file  Occupational History  . Occupation: disability  Tobacco Use  . Smoking status: Current Every Day Smoker    Packs/day: 0.50    Years: 11.00    Pack years: 5.50    Types: Cigarettes    Start date: 09/17/2003  . Smokeless tobacco: Never Used  Vaping Use  . Vaping Use: Never used  Substance and Sexual Activity  . Alcohol use: No  . Drug use: No  . Sexual activity: Yes    Birth control/protection: Surgical    Comment: BTL  Other Topics Concern  . Not on file  Social History Narrative   Disabled from nursing   Lives at home with daughter Patricia Stanton   Social Determinants of Health   Financial Resource Strain: Not on file  Food Insecurity: Not on file  Transportation Needs: Not on file  Physical Activity: Not on file  Stress: Not on file  Social Connections: Not on file  Intimate Partner Violence: Not on file    Outpatient Medications Prior to Visit  Medication Sig Dispense Refill  . albuterol (PROVENTIL) (2.5 MG/3ML) 0.083% nebulizer solution Take 3 mLs (2.5 mg total) by nebulization every 6 (six) hours as needed for wheezing or shortness of breath. 150 mL 1  . albuterol (VENTOLIN HFA) 108 (90 Base) MCG/ACT inhaler Inhale 2 puffs into the lungs every 6 (six) hours as needed for wheezing or shortness of breath. 18 g 6  . budesonide-formoterol (SYMBICORT) 80-4.5 MCG/ACT inhaler Inhale 2 puffs into the lungs 2 (two) times daily. 1 each 3  . cetirizine (ZYRTEC) 10 MG tablet Take 1 tablet (10 mg total)  by mouth daily. 90 tablet 1  . escitalopram (LEXAPRO) 10 MG tablet Take 1 tablet (10 mg total) by mouth daily. 90 tablet 0  . gabapentin (NEURONTIN) 600 MG tablet Take 600 mg by mouth 3 (three) times daily.    Marland Kitchen lisinopril-hydrochlorothiazide (ZESTORETIC) 10-12.5 MG tablet Take 1 tablet by mouth daily. 90 tablet 1  . lovastatin (MEVACOR) 20 MG tablet Take 1 tablet (20 mg total) by mouth at bedtime. 90 tablet 1  . metFORMIN (GLUCOPHAGE XR) 500 MG 24 hr tablet Take 2 tablets (1,000 mg total) by mouth 2 (two) times daily. 360 tablet 1  . methocarbamol (ROBAXIN) 500 MG tablet Take 500 mg by mouth 3 (three) times daily as needed for muscle spasms.     . ondansetron (ZOFRAN) 4 MG tablet Take 1 tablet (4 mg total) by mouth every 8 (eight) hours as needed for nausea or vomiting. 20 tablet  0  . oxyCODONE-acetaminophen (PERCOCET/ROXICET) 5-325 MG tablet Take 1 tablet by mouth every 4 (four) hours as needed for moderate pain. 30 tablet 0  . pantoprazole (PROTONIX) 40 MG tablet Take 1 tablet (40 mg total) by mouth 2 (two) times daily. 60 tablet 3  . prazosin (MINIPRESS) 1 MG capsule Total of 3 mg at night. Take along with 2 mg tab (Patient taking differently: Take 1 mg by mouth at bedtime. Total of 3 mg at night. Take along with 2 mg tab) 90 capsule 0  . prazosin (MINIPRESS) 2 MG capsule Take 1 capsule (2 mg total) by mouth at bedtime. 90 capsule 0   No facility-administered medications prior to visit.    Allergies  Allergen Reactions  . Tramadol Other (See Comments)    Exacerbates her asthma     ROS Review of Systems  Constitutional: Negative.   HENT: Negative.   Eyes: Negative.   Respiratory: Negative.   Cardiovascular: Negative.   Genitourinary: Negative.   Musculoskeletal: Negative.   Neurological: Positive for headaches.  All other systems reviewed and are negative.     Objective:    Physical Exam Vitals reviewed.  Constitutional:      Appearance: Normal appearance. She is obese.      Interventions: Face mask in place.  HENT:     Head: Normocephalic.     Nose: Nose normal.     Mouth/Throat:     Mouth: Mucous membranes are moist.     Pharynx: Oropharynx is clear.  Eyes:     Conjunctiva/sclera: Conjunctivae normal.  Cardiovascular:     Rate and Rhythm: Regular rhythm.     Pulses: Normal pulses.     Heart sounds: Normal heart sounds.  Pulmonary:     Effort: Pulmonary effort is normal.     Breath sounds: Normal breath sounds.  Abdominal:     General: Bowel sounds are normal.  Musculoskeletal:        General: Normal range of motion.  Skin:    General: Skin is warm.  Neurological:     Mental Status: She is alert and oriented to person, place, and time.  Psychiatric:        Behavior: Behavior is cooperative.     BP (!) 148/100   Pulse 97   Temp 97.7 F (36.5 C) (Temporal)   Ht 5\' 5"  (1.651 m)   Wt 292 lb (132.5 kg)   LMP 11/21/2020   BMI 48.59 kg/m  Wt Readings from Last 3 Encounters:  11/29/20 292 lb (132.5 kg)  11/28/20 293 lb 9.6 oz (133.2 kg)  11/27/20 295 lb (133.8 kg)     Health Maintenance Due  Topic Date Due  . Hepatitis C Screening  Never done  . OPHTHALMOLOGY EXAM  Never done  . COVID-19 Vaccine (1) Never done  . FOOT EXAM  11/17/2019    There are no preventive care reminders to display for this patient.  Lab Results  Component Value Date   TSH 1.780 03/25/2019   Lab Results  Component Value Date   WBC CANCELED 11/28/2020   HGB 12.4 11/09/2020   HCT 39.4 11/09/2020   MCV 73.4 (L) 11/09/2020   PLT 319 11/09/2020   Lab Results  Component Value Date   NA 133 (L) 11/21/2020   K 4.2 11/21/2020   CO2 21 (L) 11/21/2020   GLUCOSE 277 (H) 11/21/2020   BUN 10 11/21/2020   CREATININE 0.47 11/21/2020   BILITOT 0.5 11/28/2020   ALKPHOS 102 03/25/2019  AST 27 11/28/2020   ALT 31 (H) 11/28/2020   PROT 7.0 11/28/2020   ALBUMIN 4.6 03/25/2019   CALCIUM 9.3 11/21/2020   ANIONGAP 11 11/21/2020   Lab Results  Component Value  Date   CHOL 188 03/25/2019   Lab Results  Component Value Date   HDL 42 03/25/2019   Lab Results  Component Value Date   LDLCALC 105 (H) 03/25/2019   Lab Results  Component Value Date   TRIG 205 (H) 03/25/2019   Lab Results  Component Value Date   CHOLHDL 4.5 (H) 03/25/2019   Lab Results  Component Value Date   HGBA1C 10.0 (H) 11/28/2020      Assessment & Plan:   Problem List Items Addressed This Visit      Cardiovascular and Mediastinum   Hypertension 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 ([place])    Hypertension not well controlled on current medication lisinopril-hydrochlorothiazide 10-12.5 mg daily.  Advised patient to increase his dose by taking 2 tablets daily and follow-up in 2 weeks.  Patient verbalized understanding.  Continue low-sodium heart healthy diet with weight loss and exercise.       Relevant Medications   Semaglutide (RYBELSUS) 3 MG TABS   lisinopril-hydrochlorothiazide (ZESTORETIC) 20-25 MG tablet     Endocrine   Diabetes mellitus type 2 in obese ([place]) - Primary    Diabetes mellitus not well controlled.  A1c 10, patient is currently taking only Metformin 500 mg 2 tablets by mouth daily. Started patient on Rybelsus 3 mg.  Education provided to patient on the importance of weight loss, healthy/diabetic diet.  In printed handouts given.  Follow-up 3 months with PCP/repeat A1c and labs. Patient verbalized understanding. Sample given to patient, Rx sent to pharmacy. Patient knows to follow-up with worsening unresolved symptoms.      Relevant Medications   Semaglutide (RYBELSUS) 3 MG TABS   lisinopril-hydrochlorothiazide (ZESTORETIC) 20-25 MG tablet      Meds ordered this encounter  Medications  . Semaglutide (RYBELSUS) 3 MG TABS    Sig: Take 3 mg by mouth daily.    Dispense:  30 tablet    Refill:  1    Order Specific Question:   Supervising Provider    Answer:   Janora Norlander [[id number]]  .  lisinopril-hydrochlorothiazide (ZESTORETIC) 20-25 MG tablet    Sig: Take 1 tablet by mouth daily.    Dispense:  90 tablet    Refill:  1    Order Specific Question:   Supervising Provider    Answer:   Janora Norlander [[id number]]    Follow-up: Return in about 3 months (around 03/01/2021).    Ivy Lynn, NP

## 2020-11-30 ENCOUNTER — Telehealth: Payer: Self-pay | Admitting: Internal Medicine

## 2020-11-30 DIAGNOSIS — Z79899 Other long term (current) drug therapy: Secondary | ICD-10-CM | POA: Diagnosis not present

## 2020-11-30 LAB — CBC WITH DIFFERENTIAL/PLATELET
Absolute Monocytes: 1134 cells/uL — ABNORMAL HIGH (ref 200–950)
Basophils Absolute: 57 cells/uL (ref 0–200)
Basophils Relative: 0.3 %
Eosinophils Absolute: 113 cells/uL (ref 15–500)
Eosinophils Relative: 0.6 %
HCT: 44.6 % (ref 35.0–45.0)
Hemoglobin: 14 g/dL (ref 11.7–15.5)
Lymphs Abs: 3345 cells/uL (ref 850–3900)
MCH: 23.5 pg — ABNORMAL LOW (ref 27.0–33.0)
MCHC: 31.4 g/dL — ABNORMAL LOW (ref 32.0–36.0)
MCV: 74.8 fL — ABNORMAL LOW (ref 80.0–100.0)
MPV: 9.8 fL (ref 7.5–12.5)
Monocytes Relative: 6 %
Neutro Abs: 14251 cells/uL — ABNORMAL HIGH (ref 1500–7800)
Neutrophils Relative %: 75.4 %
Platelets: 301 10*3/uL (ref 140–400)
RBC: 5.96 10*6/uL — ABNORMAL HIGH (ref 3.80–5.10)
RDW: 15.8 % — ABNORMAL HIGH (ref 11.0–15.0)
Total Lymphocyte: 17.7 %
WBC: 18.9 10*3/uL — ABNORMAL HIGH (ref 3.8–10.8)

## 2020-11-30 NOTE — Telephone Encounter (Signed)
PATIENT WANTS A REFERRAL TO A HEMATOLOGIST BECAUSE HER WBC COUNT IS SO HIGH

## 2020-12-01 ENCOUNTER — Other Ambulatory Visit: Payer: Self-pay | Admitting: Family

## 2020-12-01 DIAGNOSIS — I129 Hypertensive chronic kidney disease with stage 1 through stage 4 chronic kidney disease, or unspecified chronic kidney disease: Secondary | ICD-10-CM

## 2020-12-01 NOTE — Telephone Encounter (Signed)
Sent message in result note.

## 2020-12-04 ENCOUNTER — Other Ambulatory Visit: Payer: Self-pay | Admitting: *Deleted

## 2020-12-04 DIAGNOSIS — D72829 Elevated white blood cell count, unspecified: Secondary | ICD-10-CM
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
## 2020-12-11 ENCOUNTER — Telehealth: Payer: Medicaid Other | Admitting: Psychiatry

## 2020-12-14 ENCOUNTER — Encounter (HOSPITAL_COMMUNITY): Payer: Self-pay | Admitting: *Deleted

## 2020-12-14 ENCOUNTER — Ambulatory Visit (INDEPENDENT_AMBULATORY_CARE_PROVIDER_SITE_OTHER): Payer: Medicaid Other | Admitting: General Surgery

## 2020-12-14 ENCOUNTER — Other Ambulatory Visit: Payer: Self-pay

## 2020-12-14 ENCOUNTER — Encounter: Payer: Self-pay | Admitting: General Surgery

## 2020-12-14 ENCOUNTER — Ambulatory Visit: Payer: Medicaid Other | Admitting: Family

## 2020-12-14 VITALS — BP 143/82 | HR 92 | Temp 97.8°F | Resp 18 | Ht 65.0 in | Wt 297.0 lb

## 2020-12-14 DIAGNOSIS — K449 Diaphragmatic hernia without obstruction or gangrene: Secondary | ICD-10-CM

## 2020-12-14 NOTE — Progress Notes (Signed)
Patricia Stanton; [id number]; December 30, 1985   HPI Patient is a 35 year old white female who was referred to my care by Drs. Rourk and Hawks for evaluation and treatment of upper abdominal pain.  Patient states she recently underwent an EGD by Dr. Gala Romney and was told she had a hiatal hernia.  She is concerned about this and was thinking I could repair it.  She has had intermittent episodes of upper abdominal pain along with reflux and some nausea.  She was recently placed on Protonix to help this.  She is status post an epigastric incisional herniorrhaphy with mesh in 2017 by myself.  She denies any fever or chills.  She denies feeling any mass in the upper abdominal wall.  It is not made worse with straining. Past Medical History:  Diagnosis Date  . Adrenal tumor   . Anxiety   . Arthritis    left knee  . Asthma   . Chronic abdominal pain   . Chronic headaches   . Depression   . Diabetes mellitus type 2 in obese ([place]) 11/20/2016  . Endometriosis   . Gastroesophageal reflux disease 11/09/2020  . HLD (hyperlipidemia) 02/17/2017  . Hypertension   . 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 Mood disorder ([place])   . Obesity   . Ovarian cyst   . Tobacco abuse     Past Surgical History:  Procedure Laterality Date  . CESAREAN SECTION  2008   [place]  . CHOLECYSTECTOMY N/A 11/25/2014   Procedure: LAPAROSCOPIC CHOLECYSTECTOMY;  Surgeon: Aviva Signs Md, MD;  Location: [facility];  Service: General;  Laterality: N/A;  . ESOPHAGOGASTRODUODENOSCOPY N/A 12/21/2015   Dr. Gala Romney: normal esophagus, non-bleeding erosive gastropathy, normal second portion of duodenum. Reactive gastropathy/chemical gastritis, negative H.pylori  . ESOPHAGOGASTRODUODENOSCOPY (EGD) WITH PROPOFOL N/A 11/23/2020   Procedure: ESOPHAGOGASTRODUODENOSCOPY (EGD) WITH PROPOFOL;  Surgeon: Daneil Dolin, MD;  Location: [facility];  Service: Endoscopy;  Laterality: N/A;  PM-diabetic  . FEMUR  FRACTURE SURGERY Left 1995   tree fell on her  . HERNIA REPAIR    . INCISIONAL HERNIA REPAIR N/A 01/29/2016   Procedure: Fatima Blank HERNIORRHAPHY WITH MESH;  Surgeon: Aviva Signs, MD;  Location: [facility];  Service: General;  Laterality: N/A;  . INSERTION OF MESH  01/29/2016   Procedure: INSERTION OF MESH;  Surgeon: Aviva Signs, MD;  Location: [facility];  Service: General;;  . KNEE ARTHROSCOPY Left   . LUMBAR LAMINECTOMY/DECOMPRESSION MICRODISCECTOMY Right 05/21/2019   Procedure: Microdiscectomy - Lumbar four-Lumbar five - right;  Surgeon: Eustace Moore, MD;  Location: [facility];  Service: Neurosurgery;  Laterality: Right;  . TUBAL LIGATION      Family History  Problem Relation Age of Onset  . Hypertension Mother   . Hyperlipidemia Mother   . Fibromyalgia Mother   . Other Mother        degenerative disc disease  . Arthritis Mother   . Kidney disease Mother   . Diabetes Father   . Hypertension Father   . Hyperlipidemia Father   . Heart disease Father 28  . Heart attack Father   . Stroke Father   . Hyperlipidemia Brother   . Hypertension Brother   . Aneurysm Maternal Grandmother        AAA  . Kidney disease Maternal Grandmother   . Kidney disease Maternal Grandfather   . Aneurysm Paternal Grandmother        AAA  . Kidney disease Paternal Grandmother   .  Kidney disease Paternal Grandfather   . Colon cancer Neg Hx   . Inflammatory bowel disease Neg Hx     Current Outpatient Medications on File Prior to Visit  Medication Sig Dispense Refill  . albuterol (PROVENTIL) (2.5 MG/3ML) 0.083% nebulizer solution Take 3 mLs (2.5 mg total) by nebulization every 6 (six) hours as needed for wheezing or shortness of breath. 150 mL 1  . albuterol (VENTOLIN HFA) 108 (90 Base) MCG/ACT inhaler Inhale 2 puffs into the lungs every 6 (six) hours as needed for wheezing or shortness of breath. 18 g 6  . budesonide-formoterol (SYMBICORT) 80-4.5 MCG/ACT inhaler Inhale 2 puffs into the lungs 2 (two) times daily.  1 each 3  . cetirizine (ZYRTEC) 10 MG tablet Take 1 tablet (10 mg total) by mouth daily. 90 tablet 1  . escitalopram (LEXAPRO) 10 MG tablet Take 1 tablet (10 mg total) by mouth daily. 90 tablet 0  . gabapentin (NEURONTIN) 600 MG tablet Take 600 mg by mouth 3 (three) times daily.    Marland Kitchen lisinopril-hydrochlorothiazide (ZESTORETIC) 20-25 MG tablet Take 1 tablet by mouth daily. 90 tablet 1  . lovastatin (MEVACOR) 20 MG tablet TAKE 1 TABLET(20 MG) BY MOUTH AT BEDTIME 90 tablet 1  . metFORMIN (GLUCOPHAGE XR) 500 MG 24 hr tablet Take 2 tablets (1,000 mg total) by mouth 2 (two) times daily. 360 tablet 1  . methocarbamol (ROBAXIN) 500 MG tablet Take 500 mg by mouth 3 (three) times daily as needed for muscle spasms.     . ondansetron (ZOFRAN) 4 MG tablet Take 1 tablet (4 mg total) by mouth every 8 (eight) hours as needed for nausea or vomiting. 20 tablet 0  . oxyCODONE-acetaminophen (PERCOCET/ROXICET) 5-325 MG tablet Take 1 tablet by mouth every 4 (four) hours as needed for moderate pain. 30 tablet 0  . pantoprazole (PROTONIX) 40 MG tablet Take 1 tablet (40 mg total) by mouth 2 (two) times daily. 60 tablet 3  . prazosin (MINIPRESS) 1 MG capsule Total of 3 mg at night. Take along with 2 mg tab (Patient taking differently: Take 1 mg by mouth at bedtime. Total of 3 mg at night. Take along with 2 mg tab) 90 capsule 0  . prazosin (MINIPRESS) 2 MG capsule Take 1 capsule (2 mg total) by mouth at bedtime. 90 capsule 0  . Semaglutide (RYBELSUS) 3 MG TABS Take 3 mg by mouth daily. 30 tablet 1   No current facility-administered medications on file prior to visit.    Allergies  Allergen Reactions  . Tramadol Other (See Comments)    Exacerbates her asthma     Social History   Substance and Sexual Activity  Alcohol Use No    Social History   Tobacco Use  Smoking Status Current Every Day Smoker  . Packs/day: 0.50  . Years: 11.00  . Pack years: 5.50  . Types: Cigarettes  . Start date: 09/17/2003  Smokeless  Tobacco Never Used    Review of Systems  Constitutional: Negative.   HENT: Negative.   Eyes: Negative.   Respiratory: Negative.   Cardiovascular: Negative.   Gastrointestinal: Positive for abdominal pain, heartburn and nausea.  Genitourinary: Negative.   Musculoskeletal: Positive for back pain, joint pain and neck pain.  Skin: Negative.   Neurological: Positive for sensory change and headaches.  Endo/Heme/Allergies: Negative.   Psychiatric/Behavioral: The patient is nervous/anxious.     Objective   Vitals:   12/14/20 0955  BP: (!) 143/82  Pulse: 92  Resp: 18  Temp: 97.8  F (36.6 C)  SpO2: 94%    Physical Exam Vitals reviewed.  Constitutional:      Appearance: Normal appearance. She is obese. She is not ill-appearing.  HENT:     Head: Normocephalic and atraumatic.  Cardiovascular:     Rate and Rhythm: Normal rate and regular rhythm.     Heart sounds: Normal heart sounds. No murmur heard. No friction rub. No gallop.   Pulmonary:     Effort: Pulmonary effort is normal. No respiratory distress.     Breath sounds: Normal breath sounds. No stridor. No wheezing, rhonchi or rales.  Abdominal:     General: Bowel sounds are normal. There is no distension.     Palpations: Abdomen is soft. There is no mass.     Tenderness: There is no abdominal tenderness. There is no guarding or rebound.     Hernia: No hernia is present.     Comments: Well-healed surgical scars without recurrent hernia.  Skin:    General: Skin is dry.  Neurological:     Mental Status: She is alert and oriented to person, place, and time.   EGD notes reviewed CT scan images of the abdomen personally reviewed.  Assessment  Hiatal hernia, small Patient does not have an abdominal wall hernia which would explain her symptoms. Plan   I reassured the patient that she does not have any surgical hernias that need to be addressed.  I did explain to her what a hiatal hernia is.  Literature was given concerning  controlling the reflux disease that can occur from a hiatal hernia.  She is satisfied with that explanation.  Follow-up as needed.

## 2020-12-14 NOTE — Patient Instructions (Signed)
Hiatal Hernia  A hiatal hernia occurs when part of the stomach slides above the muscle that separates the abdomen from the chest (diaphragm). A person can be born with a hiatal hernia (congenital), or it may develop over time. In almost all cases of hiatal hernia, only the top part of the stomach pushes through the diaphragm. Many people have a hiatal hernia with no symptoms. The larger the hernia, the more likely it is that you will have symptoms. In some cases, a hiatal hernia allows stomach acid to flow back into the tube that carries food from your mouth to your stomach (esophagus). This may cause heartburn symptoms. Severe heartburn symptoms may mean that you have developed a condition called gastroesophageal reflux disease (GERD). What are the causes? This condition is caused by a weakness in the opening (hiatus) where the esophagus passes through the diaphragm to attach to the upper part of the stomach. A person may be born with a weakness in the hiatus, or a weakness can develop over time. What increases the risk? This condition is more likely to develop in:  Older people. Age is a major risk factor for a hiatal hernia, especially if you are over the age of 51.  Pregnant women.  People who are overweight.  People who have frequent constipation. What are the signs or symptoms? Symptoms of this condition usually develop in the form of GERD symptoms. Symptoms include:  Heartburn.  Belching.  Indigestion.  Trouble swallowing.  Coughing or wheezing.  Sore throat.  Hoarseness.  Chest pain.  Nausea and vomiting. How is this diagnosed? This condition may be diagnosed during testing for GERD. Tests that may be done include:  X-rays of your stomach or chest.  An upper gastrointestinal (GI) series. This is an X-ray exam of your GI tract that is taken after you swallow a chalky liquid that shows up clearly on the X-ray.  Endoscopy. This is a procedure to look into your stomach  using a thin, flexible tube that has a tiny camera and light on the end of it. How is this treated? This condition may be treated by:  Dietary and lifestyle changes to help reduce GERD symptoms.  Medicines. These may include: ? Over-the-counter antacids. ? Medicines that make your stomach empty more quickly. ? Medicines that block the production of stomach acid (H2 blockers). ? Stronger medicines to reduce stomach acid (proton pump inhibitors).   If you have no symptoms, you may not need treatment. Follow these instructions at home: Lifestyle and activity  Do not use any products that contain nicotine or tobacco, such as cigarettes and e-cigarettes. If you need help quitting, ask your health care provider.  Try to achieve and maintain a healthy body weight.  Avoid putting pressure on your abdomen. Anything that puts pressure on your abdomen increases the amount of acid that may be pushed up into your esophagus. ? Avoid bending over, especially after eating. ? Raise the head of your bed by putting blocks under the legs. This keeps your head and esophagus higher than your stomach. ? Do not wear tight clothing around your chest or stomach. ? Try not to strain when having a bowel movement, when urinating, or when lifting heavy objects. Eating and drinking  Avoid foods that can worsen GERD symptoms. These may include: ? Fatty foods, like fried foods. ? Citrus fruits, like oranges or lemon. ? Other foods and drinks that contain acid, like orange juice or tomatoes. ? Spicy food. ? Chocolate.  Eat frequent small meals instead of three large meals a day. This helps prevent your stomach from getting too full. ? Eat slowly. ? Do not lie down right after eating. ? Do not eat 1-2 hours before bed.  Do not drink beverages with caffeine. These include cola, coffee, cocoa, and tea.  Do not drink alcohol. General instructions  Take over-the-counter and prescription medicines only as told  by your health care provider.  Keep all follow-up visits as told by your health care provider. This is important. Contact a health care provider if:  Your symptoms are not controlled with medicines or lifestyle changes.  You are having trouble swallowing.  You have coughing or wheezing that will not go away. Get help right away if:  Your pain is getting worse.  Your pain spreads to your arms, neck, jaw, teeth, or back.  You have shortness of breath.  You sweat for no reason.  You feel sick to your stomach (nauseous) or you vomit.  You vomit blood.  You have bright red blood in your stools.  You have black, tarry stools. This information is not intended to replace advice given to you by your health care provider. Make sure you discuss any questions you have with your health care provider. Document Revised: 08/15/2017 Document Reviewed: 04/07/2017 Elsevier Patient Education  [place].

## 2020-12-15 ENCOUNTER — Encounter (HOSPITAL_COMMUNITY): Payer: Self-pay | Admitting: Hematology and Oncology

## 2020-12-15 ENCOUNTER — Inpatient Hospital Stay (HOSPITAL_COMMUNITY): Payer: Medicaid Other

## 2020-12-15 ENCOUNTER — Inpatient Hospital Stay (HOSPITAL_COMMUNITY): Payer: Medicaid Other | Attending: Hematology and Oncology | Admitting: Hematology and Oncology

## 2020-12-15 VITALS — BP 155/88 | HR 112 | Resp 18 | Ht 65.0 in | Wt 295.6 lb

## 2020-12-15 DIAGNOSIS — E1159 Type 2 diabetes mellitus with other circulatory complications: Secondary | ICD-10-CM | POA: Insufficient documentation

## 2020-12-15 DIAGNOSIS — F32A Depression, unspecified: Secondary | ICD-10-CM | POA: Insufficient documentation

## 2020-12-15 DIAGNOSIS — E785 Hyperlipidemia, unspecified: Secondary | ICD-10-CM | POA: Insufficient documentation

## 2020-12-15 DIAGNOSIS — E1122 Type 2 diabetes mellitus with diabetic chronic kidney disease: Secondary | ICD-10-CM | POA: Diagnosis not present

## 2020-12-15 DIAGNOSIS — E669 Obesity, unspecified: Secondary | ICD-10-CM | POA: Insufficient documentation

## 2020-12-15 DIAGNOSIS — Z79899 Other long term (current) drug therapy: Secondary | ICD-10-CM | POA: Insufficient documentation

## 2020-12-15 DIAGNOSIS — F17210 Nicotine dependence, cigarettes, uncomplicated: Secondary | ICD-10-CM | POA: Insufficient documentation

## 2020-12-15 DIAGNOSIS — F419 Anxiety disorder, unspecified: Secondary | ICD-10-CM | POA: Insufficient documentation

## 2020-12-15 DIAGNOSIS — N189 Chronic kidney disease, unspecified: Secondary | ICD-10-CM | POA: Insufficient documentation

## 2020-12-15 DIAGNOSIS — Z7984 Long term (current) use of oral hypoglycemic drugs: Secondary | ICD-10-CM | POA: Diagnosis not present

## 2020-12-15 DIAGNOSIS — G8929 Other chronic pain: Secondary | ICD-10-CM | POA: Diagnosis not present

## 2020-12-15 DIAGNOSIS — K219 Gastro-esophageal reflux disease without esophagitis: Secondary | ICD-10-CM | POA: Diagnosis not present

## 2020-12-15 DIAGNOSIS — D72829 Elevated white blood cell count, unspecified: Secondary | ICD-10-CM | POA: Insufficient documentation

## 2020-12-15 DIAGNOSIS — I129 Hypertensive chronic kidney disease with stage 1 through stage 4 chronic kidney disease, or unspecified chronic kidney disease: Secondary | ICD-10-CM | POA: Diagnosis not present

## 2020-12-15 NOTE — Progress Notes (Signed)
[facility] CONSULT NOTE  Patient Care Team: Sharion Balloon, FNP as PCP - General (Family Medicine) Gala Romney, Cristopher Estimable, MD as Consulting Physician (Gastroenterology)  CHIEF COMPLAINTS/PURPOSE OF CONSULTATION:   Neutrophilic leukocytosis  ASSESSMENT & PLAN:  No problem-specific Assessment & Plan notes found for this encounter.  Orders Placed This Encounter  Procedures  . CBC with Differential/Platelet  . Pathologist smear review    Standing Status:   Future    Standing Expiration Date:   12/15/2021  . Sedimentation rate    Standing Status:   Future    Standing Expiration Date:   12/15/2021  . C-reactive protein    Standing Status:   Future    Standing Expiration Date:   12/15/2021  . ANA, IFA (with reflex)    Standing Status:   Future    Standing Expiration Date:   12/15/2021  . Rheumatoid factor    Standing Status:   Future    Standing Expiration Date:   12/15/2021   This is a 35 year old female patient with persistent neutrophilic leukocytosis, type 2 diabetes mellitus, hypertension, dyslipidemia referred to hematology for evaluation of persistent leukocytosis.  Patient has had leukocytosis for couple years at least and was previously evaluated by Dr. Delton Coombes and her MPN work-up was unremarkable.  She was referred now because of persistently neutrophilic leukocytosis.  She does not have any ongoing infections, steroid use.  She does smoke half to 1 pack/day and she is obese.  She does have some joint pains which she is worried that they could be rheumatoid arthritis. Physical examination, swollen small joints in her hands, otherwise no concerns. I reviewed her labs over the past 2 years, there has been no change in her leukocytosis and this is predominantly neutrophilia and monocytosis.  No basophilia no lymphocytosis.  Her MPN work-up is negative so there is no value in repeating it at this time.  She absolutely denies a bone marrow biopsy, she does not even want to talk  about it.  She wants to be referred to rheumatologist for appropriate evaluation for rheumatoid arthritis. I have ordered ANA, ESR, CRP, CBC, smear review and rheumatoid factor today. Given her small joint arthritis, young age and family history of rheumatoid arthritis, I think it is reasonable to consider rheumatology evaluation since she may still have seronegative arthritis.  Please consider referral to rheumatology for further evaluation.  At this time from hematology standpoint since she declines further investigation with bone marrow aspiration and biopsy, there is not much we can contribute for the leukocytosis and it does sound reactive likely secondary to acute inflammation, obesity and smoking.  If she has any worsening leukocytosis or if she changes her mind about further evaluation, please do refer her back to hematology.  HISTORY OF PRESENTING ILLNESS:   Patricia Stanton 35 y.o. female is here because of persistent leukocytosis.  This is a pleasant 35 year old female patient with past medical history significant for hypertension, type 2 diabetes, obesity, chronic abdominal pain, recent weight loss referred to hematology again for reevaluation of neutrophilic leukocytosis.  Patient was seen 2 years ago by Dr. Delton Coombes and had MPN testing at that time which was negative.  She is now referred back to hematology.  She and her husband were very upset that the beginning of the visit because she does not feel like she has any cancer and she does not feel like she needs bone marrow biopsy to prove that.  She believes she has rheumatoid arthritis  given her small joint arthritis and since rheumatoid arthritis runs on both sides of the family.  She thinks that this is what is causing her white blood cell count to go up and is causing her diabetes to be uncontrolled and contributing to her gastro intestinal symptoms. She denies any recent infections.  She does smoke about half to 1 pack/day depending on  the day.  She denies any recent steroid use.  She does complain of severe chronic pain and uncontrolled hypertension at times from the pain.  She is really upset that her doctor sent her back to hematology for bone marrow biopsy.  No known diagnosis of autoimmune diseases at the time.  She has lost about 15 pounds in the past 2 weeks because she feels bloated all the time and she cannot eat any food.  She had a CT and MRI abdomen recently which showed a benign adrenal adenoma otherwise no concerns.    Rest of the pertinent review of systems reviewed and negative.  MEDICAL HISTORY:  Past Medical History:  Diagnosis Date  . Adrenal tumor   . Anxiety   . Arthritis    left knee  . Asthma   . Chronic abdominal pain   . Chronic headaches   . Depression   . Diabetes mellitus type 2 in obese ([place]) 11/20/2016  . Endometriosis   . Gastroesophageal reflux disease 11/09/2020  . HLD (hyperlipidemia) 02/17/2017  . Hypertension   . 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 Mood disorder ([place])   . Obesity   . Ovarian cyst   . Tobacco abuse     SURGICAL HISTORY: Past Surgical History:  Procedure Laterality Date  . CESAREAN SECTION  2008   [place]  . CHOLECYSTECTOMY N/A 11/25/2014   Procedure: LAPAROSCOPIC CHOLECYSTECTOMY;  Surgeon: Aviva Signs Md, MD;  Location: [facility];  Service: General;  Laterality: N/A;  . ESOPHAGOGASTRODUODENOSCOPY N/A 12/21/2015   Dr. Gala Romney: normal esophagus, non-bleeding erosive gastropathy, normal second portion of duodenum. Reactive gastropathy/chemical gastritis, negative H.pylori  . ESOPHAGOGASTRODUODENOSCOPY (EGD) WITH PROPOFOL N/A 11/23/2020   Procedure: ESOPHAGOGASTRODUODENOSCOPY (EGD) WITH PROPOFOL;  Surgeon: Daneil Dolin, MD;  Location: [facility];  Service: Endoscopy;  Laterality: N/A;  PM-diabetic  . FEMUR FRACTURE SURGERY Left 1995   tree fell on her  . HERNIA REPAIR    . INCISIONAL HERNIA REPAIR N/A  01/29/2016   Procedure: Fatima Blank HERNIORRHAPHY WITH MESH;  Surgeon: Aviva Signs, MD;  Location: [facility];  Service: General;  Laterality: N/A;  . INSERTION OF MESH  01/29/2016   Procedure: INSERTION OF MESH;  Surgeon: Aviva Signs, MD;  Location: [facility];  Service: General;;  . KNEE ARTHROSCOPY Left   . LUMBAR LAMINECTOMY/DECOMPRESSION MICRODISCECTOMY Right 05/21/2019   Procedure: Microdiscectomy - Lumbar four-Lumbar five - right;  Surgeon: Eustace Moore, MD;  Location: [facility];  Service: Neurosurgery;  Laterality: Right;  . TUBAL LIGATION      SOCIAL HISTORY: Social History   Socioeconomic History  . Marital status: Divorced    Spouse name: Not on file  . Number of children: 1  . Years of education: 75  . Highest education level: Not on file  Occupational History  . Occupation: disability  Tobacco Use  . Smoking status: Current Every Day Smoker    Packs/day: 0.50    Years: 11.00    Pack years: 5.50    Types: Cigarettes    Start date: 09/17/2003  . Smokeless  tobacco: Never Used  Vaping Use  . Vaping Use: Never used  Substance and Sexual Activity  . Alcohol use: No  . Drug use: No  . Sexual activity: Yes    Birth control/protection: Surgical    Comment: BTL  Other Topics Concern  . Not on file  Social History Narrative   Disabled from nursing   Lives at home with daughter [name]   Social Determinants of Health   Financial Resource Strain: Low Risk   . Difficulty of Paying Living Expenses: Not hard at all  Food Insecurity: No Food Insecurity  . Worried About Charity fundraiser in the Last Year: Never true  . Ran Out of Food in the Last Year: Never true  Transportation Needs: No Transportation Needs  . Lack of Transportation (Medical): No  . Lack of Transportation (Non-Medical): No  Physical Activity: Inactive  . Days of Exercise per Week: 0 days  . Minutes of Exercise per Session: 0 min  Stress: Stress Concern Present  . Feeling of Stress : Very much  Social  Connections: Socially Isolated  . Frequency of Communication with Friends and Family: More than three times a week  . Frequency of Social Gatherings with Friends and Family: More than three times a week  . Attends Religious Services: Never  . Active Member of Clubs or Organizations: No  . Attends Archivist Meetings: Never  . Marital Status: Never married  Intimate Partner Violence: Not At Risk  . Fear of Current or Ex-Partner: No  . Emotionally Abused: No  . Physically Abused: No  . Sexually Abused: No    FAMILY HISTORY: Family History  Problem Relation Age of Onset  . Hypertension Mother   . Hyperlipidemia Mother   . Fibromyalgia Mother   . Other Mother        degenerative disc disease  . Arthritis Mother   . Kidney disease Mother   . Diabetes Father   . Hypertension Father   . Hyperlipidemia Father   . Heart disease Father 20  . Heart attack Father   . Stroke Father   . Hyperlipidemia Brother   . Hypertension Brother   . Aneurysm Maternal Grandmother        AAA  . Kidney disease Maternal Grandmother   . Kidney disease Maternal Grandfather   . Aneurysm Paternal Grandmother        AAA  . Kidney disease Paternal Grandmother   . Kidney disease Paternal Grandfather   . Colon cancer Neg Hx   . Inflammatory bowel disease Neg Hx     ALLERGIES:  is allergic to tramadol.  MEDICATIONS:  Current Outpatient Medications  Medication Sig Dispense Refill  . albuterol (PROVENTIL) (2.5 MG/3ML) 0.083% nebulizer solution Take 3 mLs (2.5 mg total) by nebulization every 6 (six) hours as needed for wheezing or shortness of breath. (Patient not taking: Reported on 12/14/2020) 150 mL 1  . albuterol (VENTOLIN HFA) 108 (90 Base) MCG/ACT inhaler Inhale 2 puffs into the lungs every 6 (six) hours as needed for wheezing or shortness of breath. (Patient not taking: Reported on 12/14/2020) 18 g 6  . budesonide-formoterol (SYMBICORT) 80-4.5 MCG/ACT inhaler Inhale 2 puffs into the lungs 2  (two) times daily. 1 each 3  . cetirizine (ZYRTEC) 10 MG tablet Take 1 tablet (10 mg total) by mouth daily. 90 tablet 1  . escitalopram (LEXAPRO) 10 MG tablet Take 1 tablet (10 mg total) by mouth daily. 90 tablet 0  . gabapentin (NEURONTIN) 600  MG tablet Take 600 mg by mouth 3 (three) times daily.    Marland Kitchen lisinopril-hydrochlorothiazide (ZESTORETIC) 20-25 MG tablet Take 1 tablet by mouth daily. 90 tablet 1  . lovastatin (MEVACOR) 20 MG tablet TAKE 1 TABLET(20 MG) BY MOUTH AT BEDTIME 90 tablet 1  . metFORMIN (GLUCOPHAGE XR) 500 MG 24 hr tablet Take 2 tablets (1,000 mg total) by mouth 2 (two) times daily. 360 tablet 1  . methocarbamol (ROBAXIN) 500 MG tablet Take 500 mg by mouth 3 (three) times daily as needed for muscle spasms.  (Patient not taking: Reported on 12/14/2020)    . ondansetron (ZOFRAN) 4 MG tablet Take 1 tablet (4 mg total) by mouth every 8 (eight) hours as needed for nausea or vomiting. (Patient not taking: Reported on 12/14/2020) 20 tablet 0  . oxyCODONE-acetaminophen (PERCOCET/ROXICET) 5-325 MG tablet Take 1 tablet by mouth every 4 (four) hours as needed for moderate pain. (Patient not taking: Reported on 12/14/2020) 30 tablet 0  . pantoprazole (PROTONIX) 40 MG tablet Take 1 tablet (40 mg total) by mouth 2 (two) times daily. 60 tablet 3  . prazosin (MINIPRESS) 1 MG capsule Total of 3 mg at night. Take along with 2 mg tab (Patient taking differently: Take 1 mg by mouth at bedtime. Total of 3 mg at night. Take along with 2 mg tab) 90 capsule 0  . prazosin (MINIPRESS) 2 MG capsule Take 1 capsule (2 mg total) by mouth at bedtime. 90 capsule 0  . Semaglutide (RYBELSUS) 3 MG TABS Take 3 mg by mouth daily. 30 tablet 1   No current facility-administered medications for this visit.     PHYSICAL EXAMINATION:  ECOG PERFORMANCE STATUS: 0 - Asymptomatic  Vitals:   12/15/20 1108  BP: (!) 155/88  Pulse: (!) 112  Resp: 18  SpO2: 95%   Filed Weights   12/15/20 1108  Weight: 295 lb 9.6 oz  (134.1 kg)    GENERAL:alert, no distress and comfortable, obese SKIN: skin color, texture, turgor are normal, no rashes or significant lesions EYES: normal, conjunctiva are pink and non-injected, sclera clear OROPHARYNX:no exudate, no erythema and lips, buccal mucosa, and tongue normal  NECK: supple, thyroid normal size, non-tender, without nodularity LYMPH:  no palpable lymphadenopathy in the cervical LUNGS: Scattered wheeze.   HEART: regular rate & rhythm and no murmurs and no lower extremity edema ABDOMEN: Limited examination because of abdominal obesity.  No palpable splenomegaly.   Musculoskeletal: There appears to be sausage fingers of both her hands consistent with arthritis. PSYCH: alert & oriented x 3 with fluent speech NEURO: no focal motor/sensory deficits  LABORATORY DATA:  I have reviewed the data as listed Lab Results  Component Value Date   WBC 18.9 (H) 11/30/2020   HGB 14.0 11/30/2020   HCT 44.6 11/30/2020   MCV 74.8 (L) 11/30/2020   PLT 301 11/30/2020     Chemistry      Component Value Date/Time   NA 133 (L) 11/21/2020 1136   NA 137 03/25/2019 0842   K 4.2 11/21/2020 1136   CL 101 11/21/2020 1136   CO2 21 (L) 11/21/2020 1136   BUN 10 11/21/2020 1136   BUN 6 03/25/2019 0842   CREATININE 0.47 11/21/2020 1136   CREATININE 0.51 11/09/2020 1314      Component Value Date/Time   CALCIUM 9.3 11/21/2020 1136   ALKPHOS 102 03/25/2019 0842   AST 27 11/28/2020 0910   ALT 31 (H) 11/28/2020 0910   BILITOT 0.5 11/28/2020 0910   BILITOT 0.3  03/25/2019 6063       RADIOGRAPHIC STUDIES: I have personally reviewed the radiological images as listed and agreed with the findings in the report. MR Abdomen W Wo Contrast  Result Date: 11/27/2020 CLINICAL DATA:  Abdominal pain, nausea, vomiting, enlarging adrenal nodule noted on prior CT EXAM: MRI ABDOMEN WITHOUT AND WITH CONTRAST TECHNIQUE: Multiplanar multisequence MR imaging of the abdomen was performed both before and  after the administration of intravenous contrast. CONTRAST:  10mL GADAVIST GADOBUTROL 1 MMOL/ML IV SOLN COMPARISON:  CT abdomen pelvis, 11/07/2020 FINDINGS: Lower chest: No acute findings. Hepatobiliary: Hepatomegaly, maximum coronal span 22.7 cm. Hepatic steatosis. No mass or other parenchymal abnormality identified. Status post cholecystectomy. No biliary ductal dilatation Pancreas: No mass, inflammatory changes, or other parenchymal abnormality identified. Spleen:  Within normal limits in size and appearance. Adrenals/Urinary Tract: There is a circumscribed, macroscopic fat containing left adrenal nodule measuring 3.1 x 2.3 cm. No evidence of hydronephrosis. Stomach/Bowel: Visualized portions within the abdomen are unremarkable. Vascular/Lymphatic: No pathologically enlarged lymph nodes identified. No abdominal aortic aneurysm demonstrated. Other:  None. Musculoskeletal: No suspicious bone lesions identified. IMPRESSION: 1. There is definitively benign, macroscopic fat containing left adrenal adenoma measuring 3.1 cm. 2. Hepatomegaly and hepatic steatosis. 3. Status post cholecystectomy. 4. No acute MR findings of the abdomen to explain abdominal pain, nausea, or vomiting. Electronically Signed   By: Eddie Candle M.D.   On: 11/27/2020 11:43    All questions were answered. The patient knows to call the clinic with any problems, questions or concerns. I spent 45 minutes in the care of this patient including H and P, review of records, counseling and coordination of care.     Benay Pike, MD 12/15/2020 12:01 PM

## 2020-12-18 ENCOUNTER — Other Ambulatory Visit: Payer: Self-pay

## 2020-12-18 ENCOUNTER — Ambulatory Visit (INDEPENDENT_AMBULATORY_CARE_PROVIDER_SITE_OTHER): Payer: Medicaid Other | Admitting: Family

## 2020-12-18 ENCOUNTER — Encounter: Payer: Self-pay | Admitting: Family

## 2020-12-18 VITALS — BP 136/80 | HR 100 | Temp 97.7°F | Ht 65.0 in | Wt 296.4 lb

## 2020-12-18 DIAGNOSIS — F331 Major depressive disorder, recurrent, moderate: Secondary | ICD-10-CM

## 2020-12-18 DIAGNOSIS — J410 Simple chronic bronchitis: Secondary | ICD-10-CM

## 2020-12-18 DIAGNOSIS — E1122 Type 2 diabetes mellitus with diabetic chronic kidney disease: Secondary | ICD-10-CM

## 2020-12-18 DIAGNOSIS — Z9889 Other specified postprocedural states: Secondary | ICD-10-CM

## 2020-12-18 DIAGNOSIS — Z720 Tobacco use: Secondary | ICD-10-CM | POA: Diagnosis not present

## 2020-12-18 DIAGNOSIS — E669 Obesity, unspecified: Secondary | ICD-10-CM

## 2020-12-18 DIAGNOSIS — D72829 Elevated white blood cell count, unspecified: Secondary | ICD-10-CM | POA: Diagnosis not present

## 2020-12-18 DIAGNOSIS — F4310 Post-traumatic stress disorder, unspecified: Secondary | ICD-10-CM

## 2020-12-18 DIAGNOSIS — K219 Gastro-esophageal reflux disease without esophagitis: Secondary | ICD-10-CM

## 2020-12-18 DIAGNOSIS — Z1159 Encounter for screening for other viral diseases: Secondary | ICD-10-CM

## 2020-12-18 DIAGNOSIS — E1169 Type 2 diabetes mellitus with other specified complication: Secondary | ICD-10-CM | POA: Diagnosis not present

## 2020-12-18 DIAGNOSIS — E559 Vitamin D deficiency, unspecified: Secondary | ICD-10-CM

## 2020-12-18 DIAGNOSIS — E785 Hyperlipidemia, unspecified: Secondary | ICD-10-CM

## 2020-12-18 DIAGNOSIS — I129 Hypertensive chronic kidney disease with stage 1 through stage 4 chronic kidney disease, or unspecified chronic kidney disease: Secondary | ICD-10-CM

## 2020-12-18 MED ORDER — RYBELSUS 7 MG PO TABS: 7.0000 mg | ORAL_TABLET | Freq: Every day | ORAL | 1 refills | Status: DC

## 2020-12-18 NOTE — Progress Notes (Signed)
Subjective:    Patient ID: Patricia Stanton, female    DOB: 1986-02-23, 35 y.o.   MRN: [id number]  Chief Complaint  Patient presents with  . Annual Exam   Pt calls the office today today for chronic follow up. She is followed by [facility] every 3 months for PTSD, GAD, and depression. She is followed by Pain Clinic every 3 months for chronic back pain. She is followed by a Neurosurgeon as needed and had lumbar laminectomy surgery on 05/20/2019. She had an elevated WBC and saw the hematologists and told her she needed to see a RA.  Hypertension This is a chronic problem. The current episode started more than 1 year ago. The problem has been waxing and waning since onset. The problem is uncontrolled. Associated symptoms include anxiety and peripheral edema. Pertinent negatives include no blurred vision, malaise/fatigue or shortness of breath. Risk factors for coronary artery disease include obesity, dyslipidemia and diabetes mellitus. The current treatment provides mild improvement. There is no history of CVA or heart failure.  Diabetes She presents for her follow-up diabetic visit. She has type 2 diabetes mellitus. Her disease course has been worsening. Hypoglycemia symptoms include nervousness/anxiousness. Associated symptoms include foot paresthesias. Pertinent negatives for diabetes include no blurred vision. There are no hypoglycemic complications. Symptoms are stable. Pertinent negatives for diabetic complications include no CVA. Risk factors for coronary artery disease include dyslipidemia, diabetes mellitus, hypertension and sedentary lifestyle. She is following a generally healthy diet. Her overall blood glucose range is 180-200 mg/dl. An ACE inhibitor/angiotensin II receptor blocker is being taken.  Gastroesophageal Reflux She complains of belching and heartburn. This is a chronic problem. The current episode started more than 1 year ago. The problem occurs occasionally. The problem  has been waxing and waning. She has tried a PPI for the symptoms. The treatment provided moderate relief.  Hyperlipidemia This is a chronic problem. The current episode started more than 1 year ago. The problem is uncontrolled. Recent lipid tests were reviewed and are high. Exacerbating diseases include obesity. Pertinent negatives include no shortness of breath. Current antihyperlipidemic treatment includes statins. The current treatment provides moderate improvement of lipids. Risk factors for coronary artery disease include dyslipidemia, diabetes mellitus, hypertension and a sedentary lifestyle.  Nicotine Dependence Presents for follow-up visit. Symptoms include irritability. Her urge triggers include company of smokers. The symptoms have been stable. She smokes < 1/2 a pack of cigarettes per day.  Back Pain This is a chronic problem. The current episode started more than 1 year ago. The problem occurs intermittently. The problem has been waxing and waning since onset. The pain is present in the lumbar spine. The pain is at a severity of 8/10. The pain is moderate. She has tried analgesics for the symptoms. The treatment provided mild relief.  Depression        This is a chronic problem.  The current episode started more than 1 year ago.   The problem occurs intermittently.  Associated symptoms include helplessness, hopelessness, irritable, restlessness, decreased interest and sad.  Past medical history includes anxiety.   Anxiety Presents for follow-up visit. Symptoms include depressed mood, excessive worry, irritability, nervous/anxious behavior and restlessness. Patient reports no shortness of breath. Symptoms occur occasionally. The severity of symptoms is moderate.    COPD Uses Symbicort BID. Continues to smoke 1/2 pack a day.      Review of Systems  Constitutional: Positive for irritability. Negative for malaise/fatigue.  Eyes: Negative for blurred vision.  Respiratory: Negative  for  shortness of breath.   Gastrointestinal: Positive for heartburn.  Musculoskeletal: Positive for back pain.  Psychiatric/Behavioral: Positive for depression. The patient is nervous/anxious.   All other systems reviewed and are negative.      Objective:   Physical Exam Vitals reviewed.  Constitutional:      General: She is irritable. She is not in acute distress.    Appearance: She is well-developed.  HENT:     Head: Normocephalic and atraumatic.     Right Ear: Tympanic membrane normal.     Left Ear: Tympanic membrane normal.  Eyes:     Pupils: Pupils are equal, round, and reactive to light.  Neck:     Thyroid: No thyromegaly.  Cardiovascular:     Rate and Rhythm: Normal rate and regular rhythm.     Heart sounds: Normal heart sounds. No murmur heard.   Pulmonary:     Effort: Pulmonary effort is normal. No respiratory distress.     Breath sounds: Normal breath sounds. No wheezing.  Abdominal:     General: Bowel sounds are normal. There is no distension.     Palpations: Abdomen is soft.     Tenderness: There is no abdominal tenderness.  Musculoskeletal:        General: No tenderness. Normal range of motion.     Cervical back: Normal range of motion and neck supple.  Skin:    General: Skin is warm and dry.  Neurological:     Mental Status: She is alert and oriented to person, place, and time.     Cranial Nerves: No cranial nerve deficit.     Deep Tendon Reflexes: Reflexes are normal and symmetric.  Psychiatric:        Behavior: Behavior normal.        Thought Content: Thought content normal.        Judgment: Judgment normal.       Diabetic Foot Exam - Simple   Simple Foot Form Diabetic Foot exam was performed with the following findings: Yes 12/18/2020  9:25 AM  Visual Inspection No deformities, no ulcerations, no other skin breakdown bilaterally: Yes Sensation Testing Intact to touch and monofilament testing bilaterally: Yes Pulse Check Posterior Tibialis and  Dorsalis pulse intact bilaterally: Yes Comments        BP 136/80   Pulse 100   Temp 97.7 F (36.5 C)   Ht 5\' 5"  (1.651 m)   Wt 296 lb 6.4 oz (134.4 kg)   LMP 11/21/2020   SpO2 91%   BMI 49.32 kg/m  Assessment & Plan:  Shaiann Mcmanamon Winstead comes in today with chief complaint of Annual Exam   Diagnosis and orders addressed:  1. 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 Gastroesophageal reflux disease, unspecified whether esophagitis present - CMP14+EGFR  3. Diabetes mellitus type 2 in obese (HCC) - CMP14+EGFR - Semaglutide (RYBELSUS) 7 MG TABS; Take 7 mg by mouth daily.  Dispense: 90 tablet; Refill: 1  4. Hyperlipidemia associated with type 2 diabetes mellitus (HCC) - CMP14+EGFR  5. Tobacco abuse - CMP14+EGFR  6. Morbid obesity (HCC) - CMP14+EGFR  7. PTSD (post-traumatic stress disorder) - CMP14+EGFR  8. Vitamin D deficiency  - CMP14+EGFR  9. Major depressive disorder, recurrent episode, moderate (HCC) - CMP14+EGFR  10. Leukocytosis, unspecified type - CMP14+EGFR - Ambulatory referral to Rheumatology - Rheumatoid factor - ANA,IFA RA Diag Pnl w/rflx Tit/Patn - C-reactive protein - Sedimentation Rate - Pathologist smear review  11. S/P lumbar laminectomy  - CMP14+EGFR  12. Need for hepatitis C screening test - CMP14+EGFR - Hepatitis C antibody  13. Simple chronic bronchitis ([place])   Labs pending Health Maintenance reviewed Diet and exercise encouraged  Follow up plan: 2 months    Evelina Dun, FNP
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
## 2020-12-18 NOTE — Patient Instructions (Signed)
Diabetes Mellitus and Nutrition, Adult When you have diabetes, or diabetes mellitus, it is very important to have healthy eating habits because your blood sugar (glucose) levels are greatly affected by what you eat and drink. Eating healthy foods in the right amounts, at about the same times every day, can help you:  Control your blood glucose.  Lower your risk of heart disease.  Improve your blood pressure.  Reach or maintain a healthy weight. What can affect my meal plan? Every person with diabetes is different, and each person has different needs for a meal plan. Your health care provider may recommend that you work with a dietitian to make a meal plan that is best for you. Your meal plan may vary depending on factors such as:  The calories you need.  The medicines you take.  Your weight.  Your blood glucose, blood pressure, and cholesterol levels.  Your activity level.  Other health conditions you have, such as heart or kidney disease. How do carbohydrates affect me? Carbohydrates, also called carbs, affect your blood glucose level more than any other type of food. Eating carbs naturally raises the amount of glucose in your blood. Carb counting is a method for keeping track of how many carbs you eat. Counting carbs is important to keep your blood glucose at a healthy level, especially if you use insulin or take certain oral diabetes medicines. It is important to know how many carbs you can safely have in each meal. This is different for every person. Your dietitian can help you calculate how many carbs you should have at each meal and for each snack. How does alcohol affect me? Alcohol can cause a sudden decrease in blood glucose (hypoglycemia), especially if you use insulin or take certain oral diabetes medicines. Hypoglycemia can be a life-threatening condition. Symptoms of hypoglycemia, such as sleepiness, dizziness, and confusion, are similar to symptoms of having too much  alcohol.  Do not drink alcohol if: ? Your health care provider tells you not to drink. ? You are pregnant, may be pregnant, or are planning to become pregnant.  If you drink alcohol: ? Do not drink on an empty stomach. ? Limit how much you use to:  0-1 drink a day for women.  0-2 drinks a day for men. ? Be aware of how much alcohol is in your drink. In the [place] one drink equals one 12 oz bottle of beer (355 mL), one 5 oz glass of wine (148 mL), or one 1 oz glass of hard liquor (44 mL). ? Keep yourself hydrated with water, diet soda, or unsweetened iced tea.  Keep in mind that regular soda, juice, and other mixers may contain a lot of sugar and must be counted as carbs. What are tips for following this plan? Reading food labels  Start by checking the serving size on the "Nutrition Facts" label of packaged foods and drinks. The amount of calories, carbs, fats, and other nutrients listed on the label is based on one serving of the item. Many items contain more than one serving per package.  Check the total grams (g) of carbs in one serving. You can calculate the number of servings of carbs in one serving by dividing the total carbs by 15. For example, if a food has 30 g of total carbs per serving, it would be equal to 2 servings of carbs.  Check the number of grams (g) of saturated fats and trans fats in one serving. Choose foods that have   a low amount or none of these fats.  Check the number of milligrams (mg) of salt (sodium) in one serving. Most people should limit total sodium intake to less than 2,300 mg per day.  Always check the nutrition information of foods labeled as "low-fat" or "nonfat." These foods may be higher in added sugar or refined carbs and should be avoided.  Talk to your dietitian to identify your daily goals for nutrients listed on the label. Shopping  Avoid buying canned, pre-made, or processed foods. These foods tend to be high in fat, sodium, and added  sugar.  Shop around the outside edge of the grocery store. This is where you will most often find fresh fruits and vegetables, bulk grains, fresh meats, and fresh dairy. Cooking  Use low-heat cooking methods, such as baking, instead of high-heat cooking methods like deep frying.  Cook using healthy oils, such as olive, canola, or sunflower oil.  Avoid cooking with butter, cream, or high-fat meats. Meal planning  Eat meals and snacks regularly, preferably at the same times every day. Avoid going long periods of time without eating.  Eat foods that are high in fiber, such as fresh fruits, vegetables, beans, and whole grains. Talk with your dietitian about how many servings of carbs you can eat at each meal.  Eat 4-6 oz (112-168 g) of lean protein each day, such as lean meat, chicken, fish, eggs, or tofu. One ounce (oz) of lean protein is equal to: ? 1 oz (28 g) of meat, chicken, or fish. ? 1 egg. ?  cup (62 g) of tofu.  Eat some foods each day that contain healthy fats, such as avocado, nuts, seeds, and fish.   What foods should I eat? Fruits Berries. Apples. Oranges. Peaches. Apricots. Plums. Grapes. Mango. Papaya. Pomegranate. Kiwi. Cherries. Vegetables Lettuce. Spinach. Leafy greens, including kale, chard, collard greens, and mustard greens. Beets. Cauliflower. Cabbage. Broccoli. Carrots. Green beans. Tomatoes. Peppers. Onions. Cucumbers. Brussels sprouts. Grains Whole grains, such as whole-wheat or whole-grain bread, crackers, tortillas, cereal, and pasta. Unsweetened oatmeal. Quinoa. Brown or wild rice. Meats and other proteins Seafood. Poultry without skin. Lean cuts of poultry and beef. Tofu. Nuts. Seeds. Dairy Low-fat or fat-free dairy products such as milk, yogurt, and cheese. The items listed above may not be a complete list of foods and beverages you can eat. Contact a dietitian for more information. What foods should I avoid? Fruits Fruits canned with  syrup. Vegetables Canned vegetables. Frozen vegetables with butter or cream sauce. Grains Refined white flour and flour products such as bread, pasta, snack foods, and cereals. Avoid all processed foods. Meats and other proteins Fatty cuts of meat. Poultry with skin. Breaded or fried meats. Processed meat. Avoid saturated fats. Dairy Full-fat yogurt, cheese, or milk. Beverages Sweetened drinks, such as soda or iced tea. The items listed above may not be a complete list of foods and beverages you should avoid. Contact a dietitian for more information. Questions to ask a health care provider  Do I need to meet with a diabetes educator?  Do I need to meet with a dietitian?  What number can I call if I have questions?  When are the best times to check my blood glucose? Where to find more information:  American Diabetes Association: diabetes.org  Academy of Nutrition and Dietetics: www.eatright.org  National Institute of Diabetes and Digestive and Kidney Diseases: www.niddk.nih.gov  Association of Diabetes Care and Education Specialists: www.diabeteseducator.org Summary  It is important to have healthy eating   habits because your blood sugar (glucose) levels are greatly affected by what you eat and drink.  A healthy meal plan will help you control your blood glucose and maintain a healthy lifestyle.  Your health care provider may recommend that you work with a dietitian to make a meal plan that is best for you.  Keep in mind that carbohydrates (carbs) and alcohol have immediate effects on your blood glucose levels. It is important to count carbs and to use alcohol carefully. This information is not intended to replace advice given to you by your health care provider. Make sure you discuss any questions you have with your health care provider. Document Revised: 08/10/2019 Document Reviewed: 08/10/2019 Elsevier Patient Education  2021 Elsevier Inc.  

## 2020-12-19 ENCOUNTER — Other Ambulatory Visit: Payer: Self-pay | Admitting: Hematology and Oncology

## 2020-12-19 ENCOUNTER — Telehealth: Payer: Self-pay | Admitting: Family Medicine

## 2020-12-19 ENCOUNTER — Telehealth: Payer: Self-pay

## 2020-12-19 LAB — CMP14+EGFR
ALT: 33 IU/L — ABNORMAL HIGH (ref 0–32)
AST: 26 IU/L (ref 0–40)
Albumin/Globulin Ratio: 1.8 (ref 1.2–2.2)
Albumin: 4.2 g/dL (ref 3.8–4.8)
Alkaline Phosphatase: 109 IU/L (ref 44–121)
BUN/Creatinine Ratio: 10 (ref 9–23)
BUN: 5 mg/dL — ABNORMAL LOW (ref 6–20)
Bilirubin Total: 0.3 mg/dL (ref 0.0–1.2)
CO2: 20 mmol/L (ref 20–29)
Calcium: 9.1 mg/dL (ref 8.7–10.2)
Chloride: 100 mmol/L (ref 96–106)
Creatinine, Ser: 0.52 mg/dL — ABNORMAL LOW (ref 0.57–1.00)
Globulin, Total: 2.3 g/dL (ref 1.5–4.5)
Glucose: 213 mg/dL — ABNORMAL HIGH (ref 65–99)
Potassium: 4.5 mmol/L (ref 3.5–5.2)
Sodium: 138 mmol/L (ref 134–144)
Total Protein: 6.5 g/dL (ref 6.0–8.5)
eGFR: 124 mL/min/{1.73_m2} (ref >59)

## 2020-12-19 LAB — HEPATITIS C ANTIBODY: Hep C Virus Ab: <0.1 s/co ratio (ref 0.0–0.9)

## 2020-12-19 NOTE — Telephone Encounter (Signed)
Pa sent ac 04/05

## 2020-12-19 NOTE — Progress Notes (Signed)
I called the patient and discussed lab results. Leukocytosis improved, Her CRP is remarkably high and also given swollen joints and possible reactive leukocytosis, I recommended referral to rheumatology. She may also benefit from iron panel and ferritin testing given her microcytosis. She will get this ordered by her PCP.  Terence Googe

## 2020-12-19 NOTE — Telephone Encounter (Signed)
Please review labs. 

## 2020-12-19 NOTE — Telephone Encounter (Signed)
  Pa started for Rybelsus 7mg  and sent to plan today.   Your demographic data has been sent to [facility].

## 2020-12-19 NOTE — Telephone Encounter (Signed)
See result note.  

## 2020-12-19 NOTE — Telephone Encounter (Signed)
This request has received a Favorable outcome.  Please note any additional information provided by [employer] [facility] [employer] [place] [employer] at the bottom of this request. 

## 2020-12-20 ENCOUNTER — Telehealth (INDEPENDENT_AMBULATORY_CARE_PROVIDER_SITE_OTHER): Payer: Medicaid Other | Admitting: Psychiatry

## 2020-12-20 ENCOUNTER — Encounter: Payer: Self-pay | Admitting: Psychiatry

## 2020-12-20 ENCOUNTER — Other Ambulatory Visit: Payer: Self-pay

## 2020-12-20 DIAGNOSIS — F321 Major depressive disorder, single episode, moderate: Secondary | ICD-10-CM | POA: Diagnosis not present

## 2020-12-20 DIAGNOSIS — F4310 Post-traumatic stress disorder, unspecified: Secondary | ICD-10-CM | POA: Diagnosis not present

## 2020-12-20 LAB — PATHOLOGIST SMEAR REVIEW
Basophils Absolute: 0.1 10*3/uL (ref 0.0–0.2)
Basos: 0 %
EOS (ABSOLUTE): 0.2 10*3/uL (ref 0.0–0.4)
Eos: 1 %
Hematocrit: 40.8 % (ref 34.0–46.6)
Hemoglobin: 13 g/dL (ref 11.1–15.9)
Immature Grans (Abs): 0.1 10*3/uL (ref 0.0–0.1)
Immature Granulocytes: 1 %
Lymphocytes Absolute: 2.7 10*3/uL (ref 0.7–3.1)
Lymphs: 21 %
MCH: 23.8 pg — ABNORMAL LOW (ref 26.6–33.0)
MCHC: 31.9 g/dL (ref 31.5–35.7)
MCV: 75 fL — ABNORMAL LOW (ref 79–97)
Monocytes Absolute: 0.9 10*3/uL (ref 0.1–0.9)
Monocytes: 7 %
Neutrophils Absolute: 9.1 10*3/uL — ABNORMAL HIGH (ref 1.4–7.0)
Neutrophils: 70 %
Platelets: 266 10*3/uL (ref 150–450)
RBC: 5.47 x10E6/uL — ABNORMAL HIGH (ref 3.77–5.28)
RDW: 17.9 % — ABNORMAL HIGH (ref 11.7–15.4)
WBC: 13 10*3/uL — ABNORMAL HIGH (ref 3.4–10.8)

## 2020-12-20 LAB — SPECIMEN STATUS REPORT

## 2020-12-20 LAB — SEDIMENTATION RATE: Sed Rate: 43 mm/hr — ABNORMAL HIGH (ref 0–32)

## 2020-12-20 LAB — ANA,IFA RA DIAG PNL W/RFLX TIT/PATN
ANA Titer 1: NEGATIVE
Cyclic Citrullin Peptide Ab: 9 units (ref 0–19)
Rheumatoid fact SerPl-aCnc: 12.5 IU/mL (ref <14.0)

## 2020-12-20 LAB — HGB A1C W/O EAG: Hgb A1c MFr Bld: 9.9 % — ABNORMAL HIGH (ref 4.8–5.6)

## 2020-12-20 LAB — C-REACTIVE PROTEIN: CRP: 59 mg/L — ABNORMAL HIGH (ref 0–10)

## 2020-12-20 MED ORDER — ESCITALOPRAM OXALATE 20 MG PO TABS: 20.0000 mg | ORAL_TABLET | Freq: Every day | ORAL | 0 refills | Status: DC

## 2020-12-20 MED ORDER — PRAZOSIN HCL 1 MG PO CAPS: 3.0000 mg | ORAL_CAPSULE | Freq: Every day | ORAL | 0 refills | Status: DC

## 2020-12-20 NOTE — Patient Instructions (Addendum)
1.Increase lexapro20 mg daily  2. Continue prazosin3mg  at night  3.Next appointment:5/10 at 1:20 4. Obtain blood test (TSH) at [place]

## 2020-12-21 ENCOUNTER — Other Ambulatory Visit: Payer: Medicaid Other

## 2020-12-21 DIAGNOSIS — Z114 Encounter for screening for human immunodeficiency virus [HIV]: Secondary | ICD-10-CM

## 2020-12-21 DIAGNOSIS — Z9889 Other specified postprocedural states: Secondary | ICD-10-CM

## 2020-12-21 DIAGNOSIS — F331 Major depressive disorder, recurrent, moderate: Secondary | ICD-10-CM

## 2020-12-21 DIAGNOSIS — E1169 Type 2 diabetes mellitus with other specified complication: Secondary | ICD-10-CM

## 2020-12-21 DIAGNOSIS — J410 Simple chronic bronchitis: Secondary | ICD-10-CM

## 2020-12-21 DIAGNOSIS — F321 Major depressive disorder, single episode, moderate: Secondary | ICD-10-CM | POA: Diagnosis not present

## 2020-12-21 DIAGNOSIS — E1122 Type 2 diabetes mellitus with diabetic chronic kidney disease: Secondary | ICD-10-CM | POA: Diagnosis not present

## 2020-12-21 DIAGNOSIS — Z1159 Encounter for screening for other viral diseases: Secondary | ICD-10-CM

## 2020-12-21 DIAGNOSIS — E785 Hyperlipidemia, unspecified: Secondary | ICD-10-CM

## 2020-12-21 DIAGNOSIS — D72829 Elevated white blood cell count, unspecified: Secondary | ICD-10-CM

## 2020-12-21 DIAGNOSIS — J449 Chronic obstructive pulmonary disease, unspecified: Secondary | ICD-10-CM | POA: Diagnosis not present

## 2020-12-21 DIAGNOSIS — K5900 Constipation, unspecified: Secondary | ICD-10-CM

## 2020-12-21 DIAGNOSIS — F4310 Post-traumatic stress disorder, unspecified: Secondary | ICD-10-CM | POA: Diagnosis not present

## 2020-12-21 DIAGNOSIS — F17200 Nicotine dependence, unspecified, uncomplicated: Secondary | ICD-10-CM

## 2020-12-21 DIAGNOSIS — E669 Obesity, unspecified: Secondary | ICD-10-CM | POA: Diagnosis not present

## 2020-12-21 DIAGNOSIS — I129 Hypertensive chronic kidney disease with stage 1 through stage 4 chronic kidney disease, or unspecified chronic kidney disease: Secondary | ICD-10-CM | POA: Diagnosis not present

## 2020-12-21 LAB — BAYER DCA HB A1C WAIVED: HB A1C (BAYER DCA - WAIVED): 10.3 % — ABNORMAL HIGH (ref <7.0)

## 2020-12-21 NOTE — Addendum Note (Signed)
Addended by: Collier Bullock on: 12/21/2020 09:15 AM   Modules accepted: Orders

## 2020-12-21 NOTE — Addendum Note (Signed)
Addended by: Collier Bullock on: 12/21/2020 09:16 AM   Modules accepted: Orders

## 2020-12-22 ENCOUNTER — Encounter: Payer: Self-pay | Admitting: Psychiatry

## 2020-12-22 LAB — CBC WITH DIFFERENTIAL/PLATELET
Basophils Absolute: 0.1 10*3/uL (ref 0.0–0.2)
Basos: 0 %
EOS (ABSOLUTE): 0.2 10*3/uL (ref 0.0–0.4)
Eos: 1 %
Hematocrit: 41.1 % (ref 34.0–46.6)
Hemoglobin: 12.7 g/dL (ref 11.1–15.9)
Immature Grans (Abs): 0.1 10*3/uL (ref 0.0–0.1)
Immature Granulocytes: 1 %
Lymphocytes Absolute: 3.3 10*3/uL — ABNORMAL HIGH (ref 0.7–3.1)
Lymphs: 26 %
MCH: 23.6 pg — ABNORMAL LOW (ref 26.6–33.0)
MCHC: 30.9 g/dL — ABNORMAL LOW (ref 31.5–35.7)
MCV: 76 fL — ABNORMAL LOW (ref 79–97)
Monocytes Absolute: 0.6 10*3/uL (ref 0.1–0.9)
Monocytes: 5 %
Neutrophils Absolute: 8.6 10*3/uL — ABNORMAL HIGH (ref 1.4–7.0)
Neutrophils: 67 %
Platelets: 284 10*3/uL (ref 150–450)
RBC: 5.39 x10E6/uL — ABNORMAL HIGH (ref 3.77–5.28)
RDW: 16.9 % — ABNORMAL HIGH (ref 11.7–15.4)
WBC: 12.8 10*3/uL — ABNORMAL HIGH (ref 3.4–10.8)

## 2020-12-22 LAB — SEDIMENTATION RATE: Sed Rate: 39 mm/hr — ABNORMAL HIGH (ref 0–32)

## 2020-12-22 LAB — C-REACTIVE PROTEIN: CRP: 22 mg/L — ABNORMAL HIGH (ref 0–10)

## 2020-12-22 LAB — RHEUMATOID FACTOR: Rheumatoid fact SerPl-aCnc: 11.8 IU/mL (ref <14.0)

## 2020-12-22 LAB — HIV ANTIBODY (ROUTINE TESTING W REFLEX): HIV Screen 4th Generation wRfx: NONREACTIVE

## 2020-12-22 LAB — TSH: TSH: 1.19 u[IU]/mL (ref 0.450–4.500)

## 2020-12-25 LAB — PATHOLOGIST SMEAR REVIEW
Basophils Absolute: 0 10*3/uL (ref 0.0–0.2)
Basos: 0 %
EOS (ABSOLUTE): 0.2 10*3/uL (ref 0.0–0.4)
Eos: 1 %
Hematocrit: 40.2 % (ref 34.0–46.6)
Hemoglobin: 12.8 g/dL (ref 11.1–15.9)
Immature Grans (Abs): 0.1 10*3/uL (ref 0.0–0.1)
Immature Granulocytes: 1 %
Lymphocytes Absolute: 3.3 10*3/uL — ABNORMAL HIGH (ref 0.7–3.1)
Lymphs: 26 %
MCH: 24.1 pg — ABNORMAL LOW (ref 26.6–33.0)
MCHC: 31.8 g/dL (ref 31.5–35.7)
MCV: 76 fL — ABNORMAL LOW (ref 79–97)
Monocytes Absolute: 0.7 10*3/uL (ref 0.1–0.9)
Monocytes: 5 %
Neutrophils Absolute: 8.8 10*3/uL — ABNORMAL HIGH (ref 1.4–7.0)
Neutrophils: 67 %
Platelets: 278 10*3/uL (ref 150–450)
RBC: 5.31 x10E6/uL — ABNORMAL HIGH (ref 3.77–5.28)
RDW: 17.6 % — ABNORMAL HIGH (ref 11.7–15.4)
WBC: 13 10*3/uL — ABNORMAL HIGH (ref 3.4–10.8)

## 2020-12-25 LAB — ANTINUCLEAR ANTIBODIES, IFA: ANA Titer 1: NEGATIVE

## 2021-01-03 NOTE — Progress Notes (Signed)
Referring Provider: Junie Spencer, FNP Primary Care Physician:  Junie Spencer, FNP Primary GI Physician: Dr. Jena Gauss  Chief Complaint  Patient presents with  . Abdominal Pain    bloating all over  . Gastroesophageal Reflux    HPI:   Patricia Stanton is a 35 y.o. female presenting today for follow-up.  History of upper abdominal pain, constipation, GERD. She had acute worsening of upper abdominal pain in February 2022. History of cholecystectomy in 2016.  Ventral hernia repair with mesh in May 2017.   CT A/P with contrast February 2022 with no acute abnormalities.  She had an enlarging left adrenal nodule.  Follow-up MRI confirming benign, macroscopic fat-containing left adrenal adenoma, hepatomegaly and hepatic steatosis. EGD March 2022 with normal esophagus, small hiatal hernia, normal examined duodenum.  Suspected abdominal wall pain and recommended a referral to Dr. Lovell Sheehan for evaluation of possible incisional hernia. She saw Dr. Lovell Sheehan in March 2022 and stated she did not have any surgical hernia that needed addressing. Previously with mildly elevated lipase though normalized in March 2022.  History of persistent leukocytosis with incomplete evaluation by hematology previously.  We recently referred her to hematology in March, but patient again declined bone marrow biopsy with recommendations to follow-up if she changed her mind.  Also with poorly controlled diabetes with hemoglobin A1c 10 in March.  Patient was last seen in our office 11/28/2020.  She is struggling with constipation.  Previously improved with Linzess with associated improvement in abdominal pain, but she ran out.  Linzess 290 mcg previously too strong.  GERD well-controlled on PPI twice daily.  Labs were updated and overall looked good aside from hemoglobin A1c of 10.  Slight elevation of ALT at 31. Lipase had returned to normal.  She was started on Linzess 145 mcg daily, and advised to follow-up in 4 weeks.      Today: Abdominal pain has improved with better management of constipation.  She is taking Linzess 145 mcg daily and having 2-3 formed bowel movements daily.  Denies BRBPR or melena.  She does report bloating 2-3 times a week with is associated with generalized abdominal discomfort.  Denies carbonated beverages.  She does consume several cruciferous vegetables on a regular basis.  States she loves cabbage and eats a lot of leafy greens.  Avoids fried and fatty foods.  Also limiting breads and pasta.  She does note some bloating with dairy as well.  No beans routinely.  She has not tried anything for this.  States getting up and moving around helps.  Started semaglutide around 1st of this month. Bloating started prior this.   GERD: Well controlled on Protonix 40 mg BID. Limiting acidic foods. No nausea or vomiting. No dysphagia.   Elevated ALT: History of fatty liver. Last labs 4/4 with ALT 33.  Hep C antibody negative.  Trying to lose weight with dietary changes. Can't exercise due to back pain.   Alcohol: None Illicit drugs: None Tylenol: None OTC supplements: None Herbal teas: Occasional.   Past Medical History:  Diagnosis Date  . Adrenal tumor   . Anxiety   . Arthritis    left knee  . Asthma   . Chronic abdominal pain   . Chronic headaches   . Depression   . Diabetes mellitus type 2 in obese ([facility]CC) 11/20/2016  . Endometriosis   . Gastroesophageal reflux disease 11/09/2020  . HLD (hyperlipidemia) 02/17/2017  . Hypertension   . 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 Mood disorder ([place])   . Obesity   . Ovarian cyst   . Tobacco abuse     Past Surgical History:  Procedure Laterality Date  . CESAREAN SECTION  2008   [place]  . CHOLECYSTECTOMY N/A 11/25/2014   Procedure: LAPAROSCOPIC CHOLECYSTECTOMY;  Surgeon: Aviva Signs Md, MD;  Location: [facility];  Service: General;  Laterality: N/A;  . ESOPHAGOGASTRODUODENOSCOPY N/A  12/21/2015   Dr. Gala Romney: normal esophagus, non-bleeding erosive gastropathy, normal second portion of duodenum. Reactive gastropathy/chemical gastritis, negative H.pylori  . ESOPHAGOGASTRODUODENOSCOPY (EGD) WITH PROPOFOL N/A 11/23/2020    Surgeon: Daneil Dolin, MD; normal esophagus, small hiatal hernia, normal examined duodenum.  Marland Kitchen FEMUR FRACTURE SURGERY Left 1995   tree fell on her  . HERNIA REPAIR    . INCISIONAL HERNIA REPAIR N/A 01/29/2016   Procedure: Fatima Blank HERNIORRHAPHY WITH MESH;  Surgeon: Aviva Signs, MD;  Location: [facility];  Service: General;  Laterality: N/A;  . INSERTION OF MESH  01/29/2016   Procedure: INSERTION OF MESH;  Surgeon: Aviva Signs, MD;  Location: [facility];  Service: General;;  . KNEE ARTHROSCOPY Left   . LUMBAR LAMINECTOMY/DECOMPRESSION MICRODISCECTOMY Right 05/21/2019   Procedure: Microdiscectomy - Lumbar four-Lumbar five - right;  Surgeon: Eustace Moore, MD;  Location: [facility];  Service: Neurosurgery;  Laterality: Right;  . TUBAL LIGATION      Current Outpatient Medications  Medication Sig Dispense Refill  . albuterol (PROVENTIL) (2.5 MG/3ML) 0.083% nebulizer solution Take 3 mLs (2.5 mg total) by nebulization every 6 (six) hours as needed for wheezing or shortness of breath. 150 mL 1  . albuterol (VENTOLIN HFA) 108 (90 Base) MCG/ACT inhaler Inhale 2 puffs into the lungs every 6 (six) hours as needed for wheezing or shortness of breath. 18 g 6  . budesonide-formoterol (SYMBICORT) 80-4.5 MCG/ACT inhaler Inhale 2 puffs into the lungs 2 (two) times daily. 1 each 3  . cetirizine (ZYRTEC) 10 MG tablet Take 1 tablet (10 mg total) by mouth daily. 90 tablet 1  . escitalopram (LEXAPRO) 10 MG tablet Take 1 tablet (10 mg total) by mouth daily. 90 tablet 0  . escitalopram (LEXAPRO) 20 MG tablet Take 1 tablet (20 mg total) by mouth daily. 90 tablet 0  . gabapentin (NEURONTIN) 600 MG tablet Take 600 mg by mouth 3 (three) times daily.    Marland Kitchen lisinopril-hydrochlorothiazide (ZESTORETIC)  20-25 MG tablet Take 1 tablet by mouth daily. 90 tablet 1  . lovastatin (MEVACOR) 20 MG tablet TAKE 1 TABLET(20 MG) BY MOUTH AT BEDTIME 90 tablet 1  . metFORMIN (GLUCOPHAGE XR) 500 MG 24 hr tablet Take 2 tablets (1,000 mg total) by mouth 2 (two) times daily. 360 tablet 1  . methocarbamol (ROBAXIN) 500 MG tablet Take 500 mg by mouth 3 (three) times daily as needed for muscle spasms.    . ondansetron (ZOFRAN) 4 MG tablet Take 1 tablet (4 mg total) by mouth every 8 (eight) hours as needed for nausea or vomiting. 20 tablet 0  . oxyCODONE-acetaminophen (PERCOCET/ROXICET) 5-325 MG tablet Take 1 tablet by mouth every 4 (four) hours as needed for moderate pain. 30 tablet 0  . pantoprazole (PROTONIX) 40 MG tablet Take 1 tablet (40 mg total) by mouth 2 (two) times daily. 60 tablet 3  . prazosin (MINIPRESS) 1 MG capsule Take 3 capsules (3 mg total) by mouth at bedtime. 270 capsule 0  . prazosin (MINIPRESS) 2 MG capsule Take 1 capsule (2 mg total) by  mouth at bedtime. 90 capsule 0  . Semaglutide (RYBELSUS) 7 MG TABS Take 7 mg by mouth daily. 90 tablet 1   No current facility-administered medications for this visit.    Allergies as of 01/04/2021 - Review Complete 01/04/2021  Allergen Reaction Noted  . Tramadol Other (See Comments) 05/21/2019    Family History  Problem Relation Age of Onset  . Hypertension Mother   . Hyperlipidemia Mother   . Fibromyalgia Mother   . Other Mother        degenerative disc disease  . Arthritis Mother   . Kidney disease Mother   . Diabetes Father   . Hypertension Father   . Hyperlipidemia Father   . Heart disease Father 10  . Heart attack Father   . Stroke Father   . Hyperlipidemia Brother   . Hypertension Brother   . Aneurysm Maternal Grandmother        AAA  . Kidney disease Maternal Grandmother   . Kidney disease Maternal Grandfather   . Aneurysm Paternal Grandmother        AAA  . Kidney disease Paternal Grandmother   . Kidney disease Paternal Grandfather    . Colon cancer Neg Hx   . Inflammatory bowel disease Neg Hx     Social History   Socioeconomic History  . Marital status: Divorced    Spouse name: Not on file  . Number of children: 1  . Years of education: 51  . Highest education level: Not on file  Occupational History  . Occupation: disability  Tobacco Use  . Smoking status: Current Every Day Smoker    Packs/day: 0.50    Years: 11.00    Pack years: 5.50    Types: Cigarettes    Start date: 09/17/2003  . Smokeless tobacco: Never Used  Vaping Use  . Vaping Use: Never used  Substance and Sexual Activity  . Alcohol use: No  . Drug use: No  . Sexual activity: Yes    Birth control/protection: Surgical    Comment: BTL  Other Topics Concern  . Not on file  Social History Narrative   Disabled from nursing   Lives at home with daughter [name]   Social Determinants of Health   Financial Resource Strain: Low Risk   . Difficulty of Paying Living Expenses: Not hard at all  Food Insecurity: No Food Insecurity  . Worried About Charity fundraiser in the Last Year: Never true  . Ran Out of Food in the Last Year: Never true  Transportation Needs: No Transportation Needs  . Lack of Transportation (Medical): No  . Lack of Transportation (Non-Medical): No  Physical Activity: Inactive  . Days of Exercise per Week: 0 days  . Minutes of Exercise per Session: 0 min  Stress: Stress Concern Present  . Feeling of Stress : Very much  Social Connections: Socially Isolated  . Frequency of Communication with Friends and Family: More than three times a week  . Frequency of Social Gatherings with Friends and Family: More than three times a week  . Attends Religious Services: Never  . Active Member of Clubs or Organizations: No  . Attends Archivist Meetings: Never  . Marital Status: Never married    Review of Systems: Gen: Denies fever, chills, cold or flulike symptoms, presyncope, syncope. CV: Denies chest pain or  palpitations. Resp: Denies dyspnea or cough. GI: See HPI Heme: See HPI  Physical Exam: BP (!) 150/97   Pulse 98   Temp (!)  97.1 F (36.2 C)   Ht $R'5\' 5"'oH$  (1.651 m)   Wt 293 lb 6.4 oz (133.1 kg)   LMP 12/22/2020   BMI 48.82 kg/m  General:   Alert and oriented. No distress noted. Pleasant and cooperative.  Head:  Normocephalic and atraumatic. Eyes:  Conjuctiva clear without scleral icterus. Mouth:  Oral mucosa pink and moist. Good dentition. No lesions. Heart:  S1, S2 present without murmurs appreciated. Lungs:  Clear to auscultation bilaterally. No wheezes, rales, or rhonchi. No distress.  Abdomen:  +BS, soft, non-tender and non-distended. No rebound or guarding. No HSM or masses noted. Msk:  Symmetrical without gross deformities. Normal posture. Extremities:  Without edema. Neurologic:  Alert and  oriented x4 Psych:  Alert and cooperative. Normal mood and affect.   Assessment:  35 y.o. female presetting today for follow-up of abdominal pain, GERD, constipation, and elevated ALT.   Abdominal Pain: Prior evaluation this year with  CT A/P with contrast February 2022 with no acute abnormalities.  She had an enlarging left adrenal nodule.  Follow-up MRI confirming benign, macroscopic fat-containing left adrenal adenoma. EGD March 2022 with normal esophagus, small hiatal hernia, normal examined duodenum. Her abdominal pain is now improved with better management of constipation.  Currently on Linzess 145 mcg daily.  She does report bloating 2-3 times a week which is associated with generalized abdominal discomfort.  Improves with getting up and moving around.  Denies nausea, vomiting. GERD remains well controlled. Suspect dietary habits are likely playing a role in gas/bloating as she reports eating quite a few cruciferous vegetables routinely and intermittent trouble with dairy. Her abdominal exam is benign today.   We will focus on dietary changes, add probiotic, and use simethicone as  needed.   GERD: Well controlled on Protonix 40 mg BID.   Constipation: Well controlled with Linzess 145 mcg daily. No alarm symptoms.   Elevated ALT: Minimal elevation of ALT at 31 in March 2022. Most recent labs 4/4 with ALT 33. Denies alcohol, illicit drug use, OTC supplements. Admit to occasional herbal teas. No regular tylenol use. Hep C antibody negative. Prior imaging with fatty liver. Also with obesity and uncontrolled diabetes. She is working on dietary changes and weight loss. Following with PCP for management of diabetes. Recently started on Rybelsus. Suspect slight elevation of ALT is likely secondary to fatty liver. Will go ahead and evaluate for Hep A immunity, Hep B, and screen for hemochromatosis with an iron panel.   Plan:  1.  Hep A Ab total, Hep B surface Ab, Hep B surface Ag, Hep B core Ab, iron panel with ferritin.  2.  Recheck HFP at next visit.   3.  Counseled on fatty liver and written instructions.   4.  Continue to avoid alcohol.  5.  Avoid OTC supplements and herbal teas.   6.  Limit tylenol if needed.   7.  Avoid common gas producing items including broccoli, cabbage, cauliflower, beans, Brussels sprouts, artificial sweeteners, carbonated beverages, drinking through a straw, hard candy.  See handout below.  8.  Follow a lactose-free diet or take Lactaid tablets prior to dairy consumption.  9.  Start a daily probiotic.  Good options include Hardin Negus' colon health, align, digestive advantage.  10.  Use simethicone as needed.  11.  May also try beano before meals if dietary changes and simethicone are not helpful.   12.  Continue Linzess 145 mcg daily.  13.  Continue Protonix 40 mg twice daily 30 minutes before breakfast  and dinner.  14. Follow-up in 3 months.    Aliene Altes, PA-C [facility] 01/04/2021

## 2021-01-04 ENCOUNTER — Other Ambulatory Visit: Payer: Self-pay

## 2021-01-04 ENCOUNTER — Ambulatory Visit: Payer: Medicaid Other | Admitting: Gastroenterology

## 2021-01-04 ENCOUNTER — Encounter: Payer: Self-pay | Admitting: Gastroenterology

## 2021-01-04 VITALS — BP 150/97 | HR 98 | Temp 97.1°F | Ht 65.0 in | Wt 293.4 lb

## 2021-01-04 DIAGNOSIS — R140 Abdominal distension (gaseous): Secondary | ICD-10-CM | POA: Insufficient documentation

## 2021-01-04 DIAGNOSIS — K5900 Constipation, unspecified: Secondary | ICD-10-CM

## 2021-01-04 DIAGNOSIS — R7401 Elevation of levels of liver transaminase levels: Secondary | ICD-10-CM | POA: Diagnosis not present

## 2021-01-04 DIAGNOSIS — K219 Gastro-esophageal reflux disease without esophagitis: Secondary | ICD-10-CM | POA: Diagnosis not present

## 2021-01-04 NOTE — Patient Instructions (Addendum)
To help with bloating:  Avoid common gas producing items including broccoli, cabbage, cauliflower, beans, Brussels sprouts, artificial sweeteners, carbonated beverages, drinking through a straw, hard candy.  See handout below. Follow a lactose-free diet or take Lactaid tablets prior to dairy consumption. Start a daily probiotic.  Good options include Hardin Negus' colon health, align, digestive advantage. You may use simethicone as needed. If the above are not helping, you may also try beano before meals.   For constipation:  Continue Linzess 145 mcg daily.  For GERD: Continue Protonix 40 mg twice daily 30 minutes before breakfast and dinner.  Elevated ALT: Suspect this is likely secondary to fatty liver. We are screening for Hep A and Hep B and checking an iron panel.  Continuing working on weight loss through diet and exercise as you are able. Continue to avoid alcohol. Avoid OTC supplements and herbal teas.  Limit tylenol if needed.   General Instructions for fatty liver: Recommend 1-2# weight loss per week until ideal body weight through exercise & diet. Low fat/cholesterol diet.   Avoid sweets, sodas, fruit juices, sweetened beverages like tea, etc. Gradually increase exercise from 15 min daily up to 1 hr per day 5 days/week.   It was good to see you today! We will see you back in 3 months.   Aliene Altes, PA-C [facility]     Abdominal Bloating When you have abdominal bloating, your abdomen may feel full, tight, or painful. It may also look bigger than normal or swollen (distended). Common causes of abdominal bloating include:  Swallowing air.  Constipation.  Problems digesting food.  Eating too much.  Irritable bowel syndrome. This is a condition that affects the large intestine.  Lactose intolerance. This is an inability to digest lactose, a natural sugar in dairy products.  Celiac disease. This is a condition that affects the ability to digest  gluten, a protein found in some grains.  Gastroparesis. This is a condition that slows down the movement of food in the stomach and small intestine. It is more common in people with diabetes mellitus.  Gastroesophageal reflux disease (GERD). This is a digestive condition that makes stomach acid flow back into the esophagus.  Urinary retention. This means that the body is holding onto urine, and the bladder cannot be emptied all the way. Follow these instructions at home: Eating and drinking  Avoid eating too much.  Try not to swallow air while talking or eating.  Avoid eating while lying down.  Avoid these foods and drinks: ? Foods that cause gas, such as broccoli, cabbage, cauliflower, and baked beans. ? Carbonated drinks. ? Hard candy. ? Chewing gum. Medicines  Take over-the-counter and prescription medicines only as told by your health care provider.  Take probiotic medicines. These medicines contain live bacteria or yeasts that can help digestion.  Take coated peppermint oil capsules. Activity  Try to exercise regularly. Exercise may help to relieve bloating that is caused by gas and relieve constipation. General instructions  Keep all follow-up visits as told by your health care provider. This is important. Contact a health care provider if:  You have nausea and vomiting.  You have diarrhea.  You have abdominal pain.  You have unusual weight loss or weight gain.  You have severe pain, and medicines do not help. Get help right away if:  You have severe chest pain.  You have trouble breathing.  You have shortness of breath.  You have trouble urinating.  You have darker urine than normal.  You have blood in your stools or have dark, tarry stools. Summary  Abdominal bloating means that the abdomen is swollen.  Common causes of abdominal bloating are swallowing air, constipation, and problems digesting food.  Avoid eating too much and avoid swallowing  air.  Avoid foods that cause gas, carbonated drinks, hard candy, and chewing gum. This information is not intended to replace advice given to you by your health care provider. Make sure you discuss any questions you have with your health care provider. Document Revised: 12/21/2018 Document Reviewed: 10/04/2016 Elsevier Patient Education  2021 [place].  Lactose-Free Diet, Adult If you have lactose intolerance, you are not able to digest lactose. Lactose is a natural sugar found mainly in dairy milk and dairy products. You may need to avoid all foods and beverages that contain lactose. A lactose-free diet can help you do this. Which foods have lactose? Lactose is found in dairy milk and dairy products, such as:  Yogurt.  Cheese.  Butter.  Margarine.  Sour cream.  Cream.  Whipped toppings and nondairy creamers.  Ice cream and other dairy-based desserts. Lactose is also found in foods or products made with dairy milk or milk ingredients. To find out whether a food contains dairy milk or a milk ingredient, look at the ingredients list. Avoid foods with the statement "May contain milk" and foods that contain:  Milk powder.  Whey.  Curd.  Caseinate.  Lactose.  Lactalbumin.  Lactoglobulin. What are alternatives to dairy milk and foods made with milk products?  Lactose-free milk.  Soy milk with added calcium and vitamin D.  Almond milk, coconut milk, rice milk, or other nondairy milk alternatives with added calcium and vitamin D. Note that these are low in protein.  Soy products, such as soy yogurt, soy cheese, soy ice cream, and soy-based sour cream.  Other nut milk products, such as almond yogurt, almond cheese, cashew yogurt, cashew cheese, cashew ice cream, coconut yogurt, and coconut ice cream. What are tips for following this plan?  Do not consume foods, beverages, vitamins, minerals, or medicines containing lactose. Read ingredient lists carefully.  Look  for the words "lactose-free" on labels.  Use lactase enzyme drops or tablets as directed by your health care provider.  Use lactose-free milk or a milk alternative, such as soy milk or almond milk, for drinking and cooking.  Make sure you get enough calcium and vitamin D in your diet. A lactose-free eating plan can be lacking in these important nutrients.  Take calcium and vitamin D supplements as directed by your health care provider. Talk to your health care provider about supplements if you are not able to get enough calcium and vitamin D from food. What foods can I eat? Fruits All fresh, canned, frozen, or dried fruits that are not processed with lactose. Vegetables All fresh, frozen, and canned vegetables without cheese, cream, or butter sauces. Grains Any that are not made with dairy milk or dairy products. Meats and other proteins Any meat, fish, poultry, and other protein sources that are not made with dairy milk or dairy products. Soy cheese and yogurt. Fats and oils Any that are not made with dairy milk or dairy products. Beverages Lactose-free milk. Soy, rice, or almond milk with added calcium and vitamin D. Fruit and vegetable juices. Sweets and desserts Any that are not made with dairy milk or dairy products. Seasonings and condiments Any that are not made with dairy milk or dairy products. Calcium Calcium is found in many  foods that contain lactose and is important for bone health. The amount of calcium you need depends on your age:  Adults younger than 50 years: 1,000 mg of calcium a day.  Adults older than 50 years: 1,200 mg of calcium a day. If you are not getting enough calcium, you may get it from other sources, including:  Orange juice with calcium added. There are 300-350 mg of calcium in 1 cup of orange juice.  Calcium-fortified soy milk. There are 300-400 mg of calcium in 1 cup of calcium-fortified soy milk.  Calcium-fortified rice or almond milk. There  are 300 mg of calcium in 1 cup of calcium-fortified rice or almond milk.  Calcium-fortified breakfast cereals. There are 100-1,000 mg of calcium in calcium-fortified breakfast cereals.  Spinach, cooked. There are 145 mg of calcium in  cup of cooked spinach.  Edamame, cooked. There are 130 mg of calcium in  cup of cooked edamame.  Collard greens, cooked. There are 125 mg of calcium in  cup of cooked collard greens.  Kale, frozen or cooked. There are 90 mg of calcium in  cup of cooked or frozen kale.  Almonds. There are 95 mg of calcium in  cup of almonds.  Broccoli, cooked. There are 60 mg of calcium in 1 cup of cooked broccoli. The items listed above may not be a complete list of recommended foods and beverages. Contact a dietitian for more options.   What foods are not recommended? Fruits None, unless they are made with dairy milk or dairy products. Vegetables None, unless they are made with dairy milk or dairy products. Grains Any grains that are made with dairy milk or dairy products. Meats and other proteins None, unless they are made with dairy milk or dairy products. Dairy All dairy products, including milk, goat's milk, buttermilk, kefir, acidophilus milk, flavored milk, evaporated milk, condensed milk, dulce de [place], eggnog, yogurt, cheese, and cheese spreads. Fats and oils Any that are made with milk or milk products. Margarines and salad dressings that contain milk or cheese. Cream. Half and half. Cream cheese. Sour cream. Chip dips made with sour cream or yogurt. Beverages Hot chocolate. Cocoa with lactose. Instant iced teas. Powdered fruit drinks. Smoothies made with dairy milk or yogurt. Sweets and desserts Any that are made with milk or milk products. Seasonings and condiments Chewing gum that has lactose. Spice blends if they contain lactose. Artificial sweeteners that contain lactose. Nondairy creamers. The items listed above may not be a complete list of  foods and beverages to avoid. Contact a dietitian for more information. Summary  If you are lactose intolerant, it means that you have a hard time digesting lactose, a natural sugar found in milk and milk products.  Following a lactose-free diet can help you manage this condition.  Calcium is important for bone health and is found in many foods that contain lactose. Talk with your health care provider about other sources of calcium. This information is not intended to replace advice given to you by your health care provider. Make sure you discuss any questions you have with your health care provider. Document Revised: 09/30/2017 Document Reviewed: 09/30/2017 Elsevier Patient Education  2021 [place].  Nonalcoholic Fatty Liver Disease Diet, Adult Nonalcoholic fatty liver disease is a condition that causes fat to build up in and around the liver. The disease makes it harder for the liver to work the way that it should. Following a healthy diet can help to keep nonalcoholic fatty liver disease  under control. It can also help to prevent or improve conditions that are associated with the disease, such as heart disease, diabetes, high blood pressure, and abnormal cholesterol levels. Along with regular exercise, this diet:  Promotes weight loss.  Helps to control blood sugar levels.  Helps to improve the way that the body uses insulin. What are tips for following this plan? Reading food labels Always check food labels for:  The amount of saturated fat in a food. You should limit your intake of saturated fat. Saturated fat is found in foods that come from animals, including meat and dairy products such as butter, cheese, and whole milk.  The amount of fiber in a food. You should choose high-fiber foods such as fruits, vegetables, and whole grains. Try to get 25-30 grams (g) of fiber a day.   Cooking  When cooking, use heart-healthy oils that are high in monounsaturated fats. These include  olive oil, canola oil, and avocado oil.  Limit frying or deep-frying foods. Cook foods using healthy methods such as baking, boiling, steaming, and grilling instead. Meal planning  You may want to keep track of how many calories you take in. Eating the right amount of calories will help you achieve a healthy weight. Meeting with a registered dietitian can help you get started.  Limit how often you eat takeout and fast food. These foods are usually very high in fat, salt, and sugar.  Use the glycemic index (GI) to plan your meals. The index tells you how quickly a food will raise your blood sugar. Choose low-GI foods (GI less than 55). These foods take a longer time to raise blood sugar. A registered dietitian can help you identify foods lower on the GI scale. Lifestyle  You may want to follow a Mediterranean diet. This diet includes a lot of vegetables, lean meats or fish, whole grains, fruits, and healthy oils and fats. What foods can I eat? Fruits Bananas. Apples. Oranges. Grapes. Papaya. Mango. Pomegranate. Kiwi. Grapefruit. Cherries. Vegetables Lettuce. Spinach. Peas. Beets. Cauliflower. Cabbage. Broccoli. Carrots. Tomatoes. Squash. Eggplant. Herbs. Peppers. Onions. Cucumbers. Brussels sprouts. Yams and sweet potatoes. Beans. Lentils. Grains Whole wheat or whole-grain foods, including breads, crackers, cereals, and pasta. Stone-ground whole wheat. Unsweetened oatmeal. Bulgur. Barley. Quinoa. Brown or wild rice. Corn or whole wheat flour tortillas. Meats and other proteins Lean meats. Poultry. Tofu. Seafood and shellfish. Dairy Low-fat or fat-free dairy products, such as yogurt, cottage cheese, or cheese. Beverages Water. Sugar-free drinks. Tea. Coffee. Low-fat or skim milk. Milk alternatives, such as soy or almond milk. Real fruit juice. Fats and oils Avocado. Canola or olive oil. Nuts and nut butters. Seeds. Seasonings and condiments Mustard. Relish. Low-fat, low-sugar ketchup and  barbecue sauce. Low-fat or fat-free mayonnaise. Sweets and desserts Sugar-free sweets. The items listed above may not be a complete list of foods and beverages you can eat. Contact a dietitian for more information.   What foods should I limit or avoid? Meats and other proteins Limit red meat to 1-2 times a week. Dairy [employer]. Fats and oils Palm oil and coconut oil. Fried foods. Other foods Processed foods. Foods that contain a lot of salt or sodium. Sweets and desserts Sweets that contain sugar. Beverages Sweetened drinks, such as sweet tea, milkshakes, iced sweet drinks, and sodas. Alcohol. The items listed above may not be a complete list of foods and beverages you should avoid. Contact a dietitian for more information. Where to find more information The [employer] of Diabetes  and Digestive and Kidney Diseases: [email or web address] Summary  Nonalcoholic fatty liver disease is a condition that causes fat to build up in and around the liver.  Following a healthy diet can help to keep nonalcoholic fatty liver disease under control. Your diet should be rich in fruits, vegetables, whole grains, and lean proteins.  Limit your intake of saturated fat. Saturated fat is found in foods that come from animals, including meat and dairy products such as butter, cheese, and whole milk.  This diet promotes weight loss, helps to control blood sugar levels, and helps to improve the way that the body uses insulin. This information is not intended to replace advice given to you by your health care provider. Make sure you discuss any questions you have with your health care provider. Document Revised: 12/25/2018 Document Reviewed: 09/24/2018 Elsevier Patient Education  [place].

## 2021-01-04 NOTE — Progress Notes (Signed)
CC'ED TO PCP 

## 2021-01-05 LAB — HEPATITIS A ANTIBODY, TOTAL: Hepatitis A AB,Total: NONREACTIVE

## 2021-01-05 LAB — HEPATITIS B SURFACE ANTIGEN: Hepatitis B Surface Ag: NONREACTIVE

## 2021-01-05 LAB — IRON,TIBC AND FERRITIN PANEL
%SAT: 9 % (calc) — ABNORMAL LOW (ref 16–45)
Ferritin: 23 ng/mL (ref 16–154)
Iron: 42 ug/dL (ref 40–190)
TIBC: 445 mcg/dL (calc) (ref 250–450)

## 2021-01-05 LAB — HEPATITIS B SURFACE ANTIBODY,QUALITATIVE: Hep B S Ab: NONREACTIVE

## 2021-01-05 LAB — HEPATITIS B CORE ANTIBODY, TOTAL: Hep B Core Total Ab: NONREACTIVE

## 2021-01-11 ENCOUNTER — Other Ambulatory Visit: Payer: Self-pay | Admitting: Family Medicine
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
## 2021-01-23 ENCOUNTER — Encounter: Payer: Self-pay | Admitting: Family Medicine

## 2021-01-23 ENCOUNTER — Other Ambulatory Visit: Payer: Self-pay

## 2021-01-23 ENCOUNTER — Telehealth: Payer: Medicaid Other | Admitting: Psychiatry
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
## 2021-01-24 ENCOUNTER — Encounter: Payer: Self-pay | Admitting: Psychiatry

## 2021-01-24 ENCOUNTER — Telehealth (INDEPENDENT_AMBULATORY_CARE_PROVIDER_SITE_OTHER): Payer: Medicaid Other | Admitting: Psychiatry

## 2021-01-24 ENCOUNTER — Telehealth (HOSPITAL_COMMUNITY): Payer: Self-pay | Admitting: Psychiatry

## 2021-01-24 DIAGNOSIS — F321 Major depressive disorder, single episode, moderate: Secondary | ICD-10-CM | POA: Diagnosis not present

## 2021-01-24 DIAGNOSIS — F4310 Post-traumatic stress disorder, unspecified: Secondary | ICD-10-CM

## 2021-01-24 NOTE — Patient Instructions (Signed)
1.Continue lexapro20 mg daily  2. Continue prazosin3mg  at night  3.Next appointment:7/6 at 8:40

## 2021-01-24 NOTE — Telephone Encounter (Signed)
Called to schedule NP therapy appt, (per Dr. Modesta Messing) left detailed vm to return call and schedule appt.

## 2021-01-25 ENCOUNTER — Other Ambulatory Visit: Payer: Self-pay | Admitting: *Deleted

## 2021-01-25 DIAGNOSIS — R140 Abdominal distension (gaseous): Secondary | ICD-10-CM

## 2021-01-25 DIAGNOSIS — R112 Nausea with vomiting, unspecified: Secondary | ICD-10-CM

## 2021-01-25 DIAGNOSIS — K219 Gastro-esophageal reflux disease without esophagitis: Secondary | ICD-10-CM

## 2021-01-25 DIAGNOSIS — Z79899 Other long term (current) drug therapy: Secondary | ICD-10-CM

## 2021-01-25 DIAGNOSIS — D72829 Elevated white blood cell count, unspecified: Secondary | ICD-10-CM

## 2021-01-25 DIAGNOSIS — R634 Abnormal weight loss: Secondary | ICD-10-CM

## 2021-01-25 DIAGNOSIS — K5900 Constipation, unspecified: Secondary | ICD-10-CM

## 2021-01-25 DIAGNOSIS — R7401 Elevation of levels of liver transaminase levels: Secondary | ICD-10-CM

## 2021-01-27 ENCOUNTER — Other Ambulatory Visit: Payer: Self-pay

## 2021-01-27 ENCOUNTER — Encounter: Payer: Self-pay | Admitting: Emergency Medicine

## 2021-01-27 ENCOUNTER — Ambulatory Visit
Admission: EM | Admit: 2021-01-27 | Discharge: 2021-01-27 | Disposition: A | Discharge status: Home / Self Care | Payer: Medicaid Other | Attending: Family Medicine | Admitting: Family Medicine

## 2021-01-27 DIAGNOSIS — J410 Simple chronic bronchitis: Secondary | ICD-10-CM

## 2021-01-27 DIAGNOSIS — J209 Acute bronchitis, unspecified: Secondary | ICD-10-CM

## 2021-01-27 DIAGNOSIS — J0140 Acute pansinusitis, unspecified: Secondary | ICD-10-CM

## 2021-01-27 MED ORDER — METHYLPREDNISOLONE SODIUM SUCC 125 MG IJ SOLR
125.0000 mg | Freq: Once | INTRAMUSCULAR | Status: AC
  Administered 2021-01-27: 125 mg via INTRAMUSCULAR

## 2021-01-27 MED ORDER — GUAIFENESIN-CODEINE 100-10 MG/5ML PO SYRP: 5.0000 mL | ORAL_SOLUTION | Freq: Three times a day (TID) | ORAL | 0 refills | Status: DC | PRN

## 2021-01-27 MED ORDER — AMOXICILLIN-POT CLAVULANATE 875-125 MG PO TABS: 1.0000 | ORAL_TABLET | Freq: Two times a day (BID) | ORAL | 0 refills | Status: AC

## 2021-01-27 MED ORDER — PREDNISONE 10 MG (21) PO TBPK: ORAL_TABLET | Freq: Every day | ORAL | 0 refills | Status: AC

## 2021-01-27 MED ORDER — BUDESONIDE-FORMOTEROL FUMARATE 80-4.5 MCG/ACT IN AERO: 2.0000 | INHALATION_SPRAY | Freq: Two times a day (BID) | RESPIRATORY_TRACT | 3 refills | Status: DC

## 2021-01-27 NOTE — ED Provider Notes (Signed)
[facility]    CSN: [id number] Arrival date & time: 01/27/21  0805      History   Chief Complaint No chief complaint on file.   HPI Patricia Stanton is a 35 y.o. female.   Reports sinus headache, pressure, nasal congestion, productive cough with yellow sputum for the last 5 days.  Reports that she has been battling allergy symptoms for the week prior.  Denies sick contacts.  States that she is a current daily smoker.  The history is provided by the patient.    Past Medical History:  Diagnosis Date  . Adrenal tumor   . Anxiety   . Arthritis    left knee  . Asthma   . Chronic abdominal pain   . Chronic headaches   . Depression   . Diabetes mellitus type 2 in obese ([place]) 11/20/2016  . Endometriosis   . Gastroesophageal reflux disease 11/09/2020  . HLD (hyperlipidemia) 02/17/2017  . Hypertension   . Hypertension 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 ([place]) 03/25/2019  . Mood disorder ([place])   . Obesity   . Ovarian cyst   . Tobacco abuse     Patient Active Problem List   Diagnosis Date Noted  . Bloating 01/04/2021  . Elevated ALT measurement 01/04/2021  . Gastroesophageal reflux disease 11/09/2020  . S/P lumbar laminectomy 05/21/2019  . Simple chronic bronchitis ([place]) 03/25/2019  . 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 Chronic abdominal pain 03/25/2019  . Abnormal bowel movement 03/25/2019  . Leukocytosis 11/19/2018  . Noncompliance 06/03/2017  . Hyperlipidemia associated with type 2 diabetes mellitus ([place]) 02/17/2017  . Major depressive disorder, recurrent episode, moderate ([place]) 02/03/2017  . Vitamin D deficiency 11/20/2016  . Diabetes mellitus type 2 in obese ([place]) 11/20/2016  . PTSD (post-traumatic stress disorder) 11/19/2016  . History of hernia surgery 11/19/2016  . Pulmonary nodule/lesion, solitary 11/19/2016  . Mucosal abnormality of stomach   . Pain  of upper abdomen 12/19/2015  . Non-intractable vomiting 12/19/2015  . Constipation 12/19/2015  . Tobacco abuse 06/06/2013  . Morbid obesity ([place]) 06/06/2013    Past Surgical History:  Procedure Laterality Date  . CESAREAN SECTION  2008   [place]  . CHOLECYSTECTOMY N/A 11/25/2014   Procedure: LAPAROSCOPIC CHOLECYSTECTOMY;  Surgeon: Aviva Signs Md, MD;  Location: [facility];  Service: General;  Laterality: N/A;  . ESOPHAGOGASTRODUODENOSCOPY N/A 12/21/2015   Dr. Gala Romney: normal esophagus, non-bleeding erosive gastropathy, normal second portion of duodenum. Reactive gastropathy/chemical gastritis, negative H.pylori  . ESOPHAGOGASTRODUODENOSCOPY (EGD) WITH PROPOFOL N/A 11/23/2020    Surgeon: Daneil Dolin, MD; normal esophagus, small hiatal hernia, normal examined duodenum.  Marland Kitchen FEMUR FRACTURE SURGERY Left 1995   tree fell on her  . HERNIA REPAIR    . INCISIONAL HERNIA REPAIR N/A 01/29/2016   Procedure: Fatima Blank HERNIORRHAPHY WITH MESH;  Surgeon: Aviva Signs, MD;  Location: [facility];  Service: General;  Laterality: N/A;  . INSERTION OF MESH  01/29/2016   Procedure: INSERTION OF MESH;  Surgeon: Aviva Signs, MD;  Location: [facility];  Service: General;;  . KNEE ARTHROSCOPY Left   . LUMBAR LAMINECTOMY/DECOMPRESSION MICRODISCECTOMY Right 05/21/2019   Procedure: Microdiscectomy - Lumbar four-Lumbar five - right;  Surgeon: Eustace Moore, MD;  Location: [facility];  Service: Neurosurgery;  Laterality: Right;  . TUBAL LIGATION      OB History   No obstetric history on file.  Home Medications    Prior to Admission medications   Medication Sig Start Date End Date Taking? Authorizing Provider  amoxicillin-clavulanate (AUGMENTIN) 875-125 MG tablet Take 1 tablet by mouth 2 (two) times daily for 10 days. 01/27/21 02/06/21 Yes Faustino Congress, NP  guaiFENesin-codeine (ROBITUSSIN AC) 100-10 MG/5ML syrup Take 5 mLs by mouth 3 (three) times daily as needed for cough. 01/27/21  Yes Faustino Congress, NP   predniSONE (STERAPRED UNI-PAK 21 TAB) 10 MG (21) TBPK tablet Take by mouth daily for 6 days. Take 6 tablets on day 1, 5 tablets on day 2, 4 tablets on day 3, 3 tablets on day 4, 2 tablets on day 5, 1 tablet on day 6 01/27/21 02/02/21 Yes Faustino Congress, NP  albuterol (PROVENTIL) (2.5 MG/3ML) 0.083% nebulizer solution Take 3 mLs (2.5 mg total) by nebulization every 6 (six) hours as needed for wheezing or shortness of breath. 06/07/20   Sharion Balloon, FNP  albuterol (VENTOLIN HFA) 108 (90 Base) MCG/ACT inhaler Inhale 2 puffs into the lungs every 6 (six) hours as needed for wheezing or shortness of breath. 06/07/20   Sharion Balloon, FNP  budesonide-formoterol (SYMBICORT) 80-4.5 MCG/ACT inhaler Inhale 2 puffs into the lungs 2 (two) times daily. 01/27/21   Faustino Congress, NP  cetirizine (ZYRTEC) 10 MG tablet Take 1 tablet (10 mg total) by mouth daily. 06/08/20   Evelina Dun A, FNP  escitalopram (LEXAPRO) 20 MG tablet Take 1 tablet (20 mg total) by mouth daily. 12/20/20 03/20/21  Norman Clay, MD  gabapentin (NEURONTIN) 600 MG tablet Take 600 mg by mouth 3 (three) times daily. 11/05/20   [provider]  lisinopril-hydrochlorothiazide (ZESTORETIC) 20-25 MG tablet Take 1 tablet by mouth daily. 11/29/20   Ivy Lynn, NP  lovastatin (MEVACOR) 20 MG tablet TAKE 1 TABLET(20 MG) BY MOUTH AT BEDTIME 12/01/20   Evelina Dun A, FNP  metFORMIN (GLUCOPHAGE XR) 500 MG 24 hr tablet Take 2 tablets (1,000 mg total) by mouth 2 (two) times daily. 06/08/20   Sharion Balloon, FNP  methocarbamol (ROBAXIN) 500 MG tablet Take 500 mg by mouth 3 (three) times daily as needed for muscle spasms. 08/18/18   [provider]  ondansetron (ZOFRAN) 4 MG tablet Take 1 tablet (4 mg total) by mouth every 8 (eight) hours as needed for nausea or vomiting. 11/09/20   Erenest Rasher, PA-C  oxyCODONE-acetaminophen (PERCOCET/ROXICET) 5-325 MG tablet Take 1 tablet by mouth every 4 (four) hours as needed for moderate  pain. 05/21/19   Eustace Moore, MD  pantoprazole (PROTONIX) 40 MG tablet Take 1 tablet (40 mg total) by mouth 2 (two) times daily. 11/09/20   Erenest Rasher, PA-C  prazosin (MINIPRESS) 1 MG capsule Take 3 capsules (3 mg total) by mouth at bedtime. 12/20/20 03/20/21  Norman Clay, MD  Semaglutide (RYBELSUS) 7 MG TABS Take 7 mg by mouth daily. 12/18/20   Sharion Balloon, FNP    Family History Family History  Problem Relation Age of Onset  . Hypertension Mother   . Hyperlipidemia Mother   . Fibromyalgia Mother   . Other Mother        degenerative disc disease  . Arthritis Mother   . Kidney disease Mother   . Diabetes Father   . Hypertension Father   . Hyperlipidemia Father   . Heart disease Father 65  . Heart attack Father   . Stroke Father   . Hyperlipidemia Brother   . Hypertension Brother   . Aneurysm Maternal Grandmother  AAA  . Kidney disease Maternal Grandmother   . Kidney disease Maternal Grandfather   . Aneurysm Paternal Grandmother        AAA  . Kidney disease Paternal Grandmother   . Kidney disease Paternal Grandfather   . Colon cancer Neg Hx   . Inflammatory bowel disease Neg Hx     Social History Social History   Tobacco Use  . Smoking status: Current Every Day Smoker    Packs/day: 0.50    Years: 11.00    Pack years: 5.50    Types: Cigarettes    Start date: 09/17/2003  . Smokeless tobacco: Never Used  Vaping Use  . Vaping Use: Never used  Substance Use Topics  . Alcohol use: No  . Drug use: No     Allergies   Tramadol   Review of Systems Review of Systems   Physical Exam Triage Vital Signs ED Triage Vitals  Enc Vitals Group     BP      Pulse      Resp      Temp      Temp src      SpO2      Weight      Height      Head Circumference      Peak Flow      Pain Score      Pain Loc      Pain Edu?      Excl. in [place]?    No data found.  Updated Vital Signs BP (!) 171/94 (BP Location: Right Arm)   Pulse (!) 110   Temp 98.4 F (36.9  C) (Oral)   Resp 18   LMP 01/15/2021   SpO2 94%   Visual Acuity Right Eye Distance:   Left Eye Distance:   Bilateral Distance:    Right Eye Near:   Left Eye Near:    Bilateral Near:     Physical Exam Vitals and nursing note reviewed.  Constitutional:      General: She is not in acute distress.    Appearance: Normal appearance. She is well-developed.  HENT:     Head: Normocephalic and atraumatic.     Right Ear: Tympanic membrane, ear canal and external ear normal.     Left Ear: Tympanic membrane, ear canal and external ear normal.     Nose: Congestion present.     Mouth/Throat:     Mouth: Mucous membranes are moist.     Pharynx: Oropharynx is clear.  Eyes:     Extraocular Movements: Extraocular movements intact.     Conjunctiva/sclera: Conjunctivae normal.     Pupils: Pupils are equal, round, and reactive to light.  Cardiovascular:     Rate and Rhythm: Normal rate and regular rhythm.     Heart sounds: Normal heart sounds. No murmur heard.   Pulmonary:     Effort: Pulmonary effort is normal. No respiratory distress.     Breath sounds: No stridor. Wheezing present. No rhonchi or rales.     Comments: Productive cough in office  Chest:     Chest wall: No tenderness.  Abdominal:     General: Bowel sounds are normal. There is no distension.     Palpations: Abdomen is soft. There is no mass.     Tenderness: There is no abdominal tenderness. There is no right CVA tenderness, left CVA tenderness, guarding or rebound.     Hernia: No hernia is present.  Musculoskeletal:  General: Normal range of motion.     Cervical back: Normal range of motion and neck supple.  Skin:    General: Skin is warm and dry.     Capillary Refill: Capillary refill takes less than 2 seconds.  Neurological:     General: No focal deficit present.     Mental Status: She is alert and oriented to person, place, and time.  Psychiatric:        Mood and Affect: Mood normal.        Behavior:  Behavior normal.        Thought Content: Thought content normal.      UC Treatments / Results  Labs (all labs ordered are listed, but only abnormal results are displayed) Labs Reviewed - No data to display  EKG   Radiology No results found.  Procedures Procedures (including critical care time)  Medications Ordered in UC Medications  methylPREDNISolone sodium succinate (SOLU-MEDROL) 125 mg/2 mL injection 125 mg (has no administration in time range)    Initial Impression / Assessment and Plan / UC Course  I have reviewed the triage vital signs and the nursing notes.  Pertinent labs & imaging results that were available during my care of the patient were reviewed by me and considered in my medical decision making (see chart for details).    Acute Pansinusitis Acute bronchitis  I have sent in Augmentin 875 twice daily x10 days Prescribe steroid taper Prescribed Cheratussin Sedation precautions given Refilled Symbicort Solu-Medrol 125 mg IM given in office today Follow up with this office or with primary care if symptoms are persisting.  Follow up in the ER for high fever, trouble swallowing, trouble breathing, other concerning symptoms.    Final Clinical Impressions(s) / UC Diagnoses   Final diagnoses:  Acute bronchitis, unspecified organism  Acute non-recurrent pansinusitis     Discharge Instructions     You received a steroid injection in the office today to help open you up.  Start the prednisone pack tomorrow.  I have sent in a prednisone taper for you to take for 6 days. 6 tablets on day one, 5 tablets on day two, 4 tablets on day three, 3 tablets on day four, 2 tablets on day five, and 1 tablet on day six.  I have sent in Augmentin for you to take twice a day for 10 days.  I have sent in cough syrup for you to take. This medication can make you sleepy. Do not drive while taking this medication.  I also refilled your Symbicort as well.  Follow up  with this office or with primary care if symptoms are persisting.  Follow up in the ER for high fever, trouble swallowing, trouble breathing, other concerning symptoms.      ED Prescriptions    Medication Sig Dispense Auth. Provider   guaiFENesin-codeine (ROBITUSSIN AC) 100-10 MG/5ML syrup Take 5 mLs by mouth 3 (three) times daily as needed for cough. 120 mL Faustino Congress, NP   budesonide-formoterol [facility]) 80-4.5 MCG/ACT inhaler Inhale 2 puffs into the lungs 2 (two) times daily. 1 each Faustino Congress, NP   predniSONE (STERAPRED UNI-PAK 21 TAB) 10 MG (21) TBPK tablet Take by mouth daily for 6 days. Take 6 tablets on day 1, 5 tablets on day 2, 4 tablets on day 3, 3 tablets on day 4, 2 tablets on day 5, 1 tablet on day 6 21 tablet Faustino Congress, NP   amoxicillin-clavulanate (AUGMENTIN) 875-125 MG tablet Take 1 tablet by mouth 2 (  two) times daily for 10 days. 20 tablet Faustino Congress, NP     PDMP not reviewed this encounter.   Faustino Congress, NP 01/27/21 [id number]

## 2021-01-27 NOTE — Discharge Instructions (Addendum)
You received a steroid injection in the office today to help open you up.  Start the prednisone pack tomorrow.  I have sent in a prednisone taper for you to take for 6 days. 6 tablets on day one, 5 tablets on day two, 4 tablets on day three, 3 tablets on day four, 2 tablets on day five, and 1 tablet on day six.  I have sent in Augmentin for you to take twice a day for 10 days.  I have sent in cough syrup for you to take. This medication can make you sleepy. Do not drive while taking this medication.  I also refilled your Symbicort as well.  Follow up with this office or with primary care if symptoms are persisting.  Follow up in the ER for high fever, trouble swallowing, trouble breathing, other concerning symptoms.

## 2021-01-27 NOTE — ED Triage Notes (Signed)
Sinus headache, pressure, nasal congestion, and productive cough with yellow sputum since Tuesday.

## 2021-02-19 ENCOUNTER — Ambulatory Visit: Payer: Medicaid Other | Admitting: Family

## 2021-02-28 ENCOUNTER — Ambulatory Visit: Payer: Medicaid Other | Admitting: Gastroenterology

## 2021-03-13 ENCOUNTER — Other Ambulatory Visit: Payer: Self-pay

## 2021-03-13 ENCOUNTER — Ambulatory Visit: Payer: Medicaid Other | Admitting: Family

## 2021-03-13 ENCOUNTER — Encounter: Payer: Self-pay | Admitting: Family

## 2021-03-13 VITALS — BP 130/77 | HR 101 | Temp 97.8°F | Ht 65.0 in | Wt 285.0 lb

## 2021-03-13 DIAGNOSIS — R7401 Elevation of levels of liver transaminase levels: Secondary | ICD-10-CM | POA: Diagnosis not present

## 2021-03-13 DIAGNOSIS — K5900 Constipation, unspecified: Secondary | ICD-10-CM | POA: Diagnosis not present

## 2021-03-13 DIAGNOSIS — K219 Gastro-esophageal reflux disease without esophagitis: Secondary | ICD-10-CM | POA: Diagnosis not present

## 2021-03-13 DIAGNOSIS — D72829 Elevated white blood cell count, unspecified: Secondary | ICD-10-CM | POA: Diagnosis not present

## 2021-03-13 DIAGNOSIS — Z720 Tobacco use: Secondary | ICD-10-CM | POA: Diagnosis not present

## 2021-03-13 DIAGNOSIS — R109 Unspecified abdominal pain: Secondary | ICD-10-CM

## 2021-03-13 DIAGNOSIS — R634 Abnormal weight loss: Secondary | ICD-10-CM | POA: Diagnosis not present

## 2021-03-13 DIAGNOSIS — E669 Obesity, unspecified: Secondary | ICD-10-CM

## 2021-03-13 DIAGNOSIS — R112 Nausea with vomiting, unspecified: Secondary | ICD-10-CM | POA: Diagnosis not present

## 2021-03-13 DIAGNOSIS — B3731 Acute candidiasis of vulva and vagina: Secondary | ICD-10-CM

## 2021-03-13 DIAGNOSIS — E1169 Type 2 diabetes mellitus with other specified complication: Secondary | ICD-10-CM

## 2021-03-13 DIAGNOSIS — B373 Candidiasis of vulva and vagina: Secondary | ICD-10-CM

## 2021-03-13 DIAGNOSIS — Z79899 Other long term (current) drug therapy: Secondary | ICD-10-CM | POA: Diagnosis not present

## 2021-03-13 DIAGNOSIS — R399 Unspecified symptoms and signs involving the genitourinary system: Secondary | ICD-10-CM

## 2021-03-13 DIAGNOSIS — R140 Abdominal distension (gaseous): Secondary | ICD-10-CM | POA: Diagnosis not present

## 2021-03-13 LAB — CMP14+EGFR
ALT: 28 IU/L (ref 0–32)
AST: 24 IU/L (ref 0–40)
Albumin/Globulin Ratio: 1.8 (ref 1.2–2.2)
Albumin: 4.3 g/dL (ref 3.8–4.8)
Alkaline Phosphatase: 106 IU/L (ref 44–121)
BUN/Creatinine Ratio: 14 (ref 9–23)
BUN: 8 mg/dL (ref 6–20)
Bilirubin Total: 0.3 mg/dL (ref 0.0–1.2)
CO2: 18 mmol/L — ABNORMAL LOW (ref 20–29)
Calcium: 9.4 mg/dL (ref 8.7–10.2)
Chloride: 98 mmol/L (ref 96–106)
Creatinine, Ser: 0.57 mg/dL (ref 0.57–1.00)
Globulin, Total: 2.4 g/dL (ref 1.5–4.5)
Glucose: 303 mg/dL — ABNORMAL HIGH (ref 65–99)
Potassium: 4.4 mmol/L (ref 3.5–5.2)
Sodium: 135 mmol/L (ref 134–144)
Total Protein: 6.7 g/dL (ref 6.0–8.5)
eGFR: 121 mL/min/{1.73_m2} (ref >59)

## 2021-03-13 LAB — URINALYSIS, COMPLETE
Bilirubin, UA: NEGATIVE
Nitrite, UA: NEGATIVE
Protein,UA: NEGATIVE
RBC, UA: NEGATIVE
Specific Gravity, UA: 1.025 (ref 1.005–1.030)
Urobilinogen, Ur: 0.2 mg/dL (ref 0.2–1.0)
pH, UA: 5.5 (ref 5.0–7.5)

## 2021-03-13 LAB — MICROSCOPIC EXAMINATION

## 2021-03-13 LAB — BAYER DCA HB A1C WAIVED: HB A1C (BAYER DCA - WAIVED): 10.2 % — ABNORMAL HIGH (ref <7.0)

## 2021-03-13 MED ORDER — CEPHALEXIN 500 MG PO CAPS: 500.0000 mg | ORAL_CAPSULE | Freq: Two times a day (BID) | ORAL | 0 refills | Status: DC

## 2021-03-13 MED ORDER — FLUCONAZOLE 150 MG PO TABS
150.0000 mg | ORAL_TABLET | ORAL | 0 refills | Status: DC | PRN
Start: 2021-03-13 — End: 2021-06-01

## 2021-03-13 NOTE — Patient Instructions (Signed)
Diabetes Mellitus and Nutrition, Adult When you have diabetes, or diabetes mellitus, it is very important to have healthy eating habits because your blood sugar (glucose) levels are greatly affected by what you eat and drink. Eating healthy foods in the right amounts, at about the same times every day, can help you:  Control your blood glucose.  Lower your risk of heart disease.  Improve your blood pressure.  Reach or maintain a healthy weight. What can affect my meal plan? Every person with diabetes is different, and each person has different needs for a meal plan. Your health care provider may recommend that you work with a dietitian to make a meal plan that is best for you. Your meal plan may vary depending on factors such as:  The calories you need.  The medicines you take.  Your weight.  Your blood glucose, blood pressure, and cholesterol levels.  Your activity level.  Other health conditions you have, such as heart or kidney disease. How do carbohydrates affect me? Carbohydrates, also called carbs, affect your blood glucose level more than any other type of food. Eating carbs naturally raises the amount of glucose in your blood. Carb counting is a method for keeping track of how many carbs you eat. Counting carbs is important to keep your blood glucose at a healthy level, especially if you use insulin or take certain oral diabetes medicines. It is important to know how many carbs you can safely have in each meal. This is different for every person. Your dietitian can help you calculate how many carbs you should have at each meal and for each snack. How does alcohol affect me? Alcohol can cause a sudden decrease in blood glucose (hypoglycemia), especially if you use insulin or take certain oral diabetes medicines. Hypoglycemia can be a life-threatening condition. Symptoms of hypoglycemia, such as sleepiness, dizziness, and confusion, are similar to symptoms of having too much  alcohol.  Do not drink alcohol if: ? Your health care provider tells you not to drink. ? You are pregnant, may be pregnant, or are planning to become pregnant.  If you drink alcohol: ? Do not drink on an empty stomach. ? Limit how much you use to:  0-1 drink a day for women.  0-2 drinks a day for men. ? Be aware of how much alcohol is in your drink. In the [place] one drink equals one 12 oz bottle of beer (355 mL), one 5 oz glass of wine (148 mL), or one 1 oz glass of hard liquor (44 mL). ? Keep yourself hydrated with water, diet soda, or unsweetened iced tea.  Keep in mind that regular soda, juice, and other mixers may contain a lot of sugar and must be counted as carbs. What are tips for following this plan? Reading food labels  Start by checking the serving size on the "Nutrition Facts" label of packaged foods and drinks. The amount of calories, carbs, fats, and other nutrients listed on the label is based on one serving of the item. Many items contain more than one serving per package.  Check the total grams (g) of carbs in one serving. You can calculate the number of servings of carbs in one serving by dividing the total carbs by 15. For example, if a food has 30 g of total carbs per serving, it would be equal to 2 servings of carbs.  Check the number of grams (g) of saturated fats and trans fats in one serving. Choose foods that have   a low amount or none of these fats.  Check the number of milligrams (mg) of salt (sodium) in one serving. Most people should limit total sodium intake to less than 2,300 mg per day.  Always check the nutrition information of foods labeled as "low-fat" or "nonfat." These foods may be higher in added sugar or refined carbs and should be avoided.  Talk to your dietitian to identify your daily goals for nutrients listed on the label. Shopping  Avoid buying canned, pre-made, or processed foods. These foods tend to be high in fat, sodium, and added  sugar.  Shop around the outside edge of the grocery store. This is where you will most often find fresh fruits and vegetables, bulk grains, fresh meats, and fresh dairy. Cooking  Use low-heat cooking methods, such as baking, instead of high-heat cooking methods like deep frying.  Cook using healthy oils, such as olive, canola, or sunflower oil.  Avoid cooking with butter, cream, or high-fat meats. Meal planning  Eat meals and snacks regularly, preferably at the same times every day. Avoid going long periods of time without eating.  Eat foods that are high in fiber, such as fresh fruits, vegetables, beans, and whole grains. Talk with your dietitian about how many servings of carbs you can eat at each meal.  Eat 4-6 oz (112-168 g) of lean protein each day, such as lean meat, chicken, fish, eggs, or tofu. One ounce (oz) of lean protein is equal to: ? 1 oz (28 g) of meat, chicken, or fish. ? 1 egg. ?  cup (62 g) of tofu.  Eat some foods each day that contain healthy fats, such as avocado, nuts, seeds, and fish.   What foods should I eat? Fruits Berries. Apples. Oranges. Peaches. Apricots. Plums. Grapes. Mango. Papaya. Pomegranate. Kiwi. Cherries. Vegetables Lettuce. Spinach. Leafy greens, including kale, chard, collard greens, and mustard greens. Beets. Cauliflower. Cabbage. Broccoli. Carrots. Green beans. Tomatoes. Peppers. Onions. Cucumbers. Brussels sprouts. Grains Whole grains, such as whole-wheat or whole-grain bread, crackers, tortillas, cereal, and pasta. Unsweetened oatmeal. Quinoa. Brown or wild rice. Meats and other proteins Seafood. Poultry without skin. Lean cuts of poultry and beef. Tofu. Nuts. Seeds. Dairy Low-fat or fat-free dairy products such as milk, yogurt, and cheese. The items listed above may not be a complete list of foods and beverages you can eat. Contact a dietitian for more information. What foods should I avoid? Fruits Fruits canned with  syrup. Vegetables Canned vegetables. Frozen vegetables with butter or cream sauce. Grains Refined white flour and flour products such as bread, pasta, snack foods, and cereals. Avoid all processed foods. Meats and other proteins Fatty cuts of meat. Poultry with skin. Breaded or fried meats. Processed meat. Avoid saturated fats. Dairy Full-fat yogurt, cheese, or milk. Beverages Sweetened drinks, such as soda or iced tea. The items listed above may not be a complete list of foods and beverages you should avoid. Contact a dietitian for more information. Questions to ask a health care provider  Do I need to meet with a diabetes educator?  Do I need to meet with a dietitian?  What number can I call if I have questions?  When are the best times to check my blood glucose? Where to find more information:  American Diabetes Association: diabetes.org  Academy of Nutrition and Dietetics: www.eatright.org  National Institute of Diabetes and Digestive and Kidney Diseases: www.niddk.nih.gov  Association of Diabetes Care and Education Specialists: www.diabeteseducator.org Summary  It is important to have healthy eating   habits because your blood sugar (glucose) levels are greatly affected by what you eat and drink.  A healthy meal plan will help you control your blood glucose and maintain a healthy lifestyle.  Your health care provider may recommend that you work with a dietitian to make a meal plan that is best for you.  Keep in mind that carbohydrates (carbs) and alcohol have immediate effects on your blood glucose levels. It is important to count carbs and to use alcohol carefully. This information is not intended to replace advice given to you by your health care provider. Make sure you discuss any questions you have with your health care provider. Document Revised: 08/10/2019 Document Reviewed: 08/10/2019 Elsevier Patient Education  2021 Elsevier Inc.  

## 2021-03-13 NOTE — Progress Notes (Signed)
Subjective:    Patient ID: Patricia Stanton, female    DOB: 10-10-1985, 35 y.o.   MRN: [id number]  Chief Complaint  Patient presents with   Diabetes   Flank Pain    Right flank pain    Pt presents to the office today to recheck DM. She was seen on 12/21/20 and her A1C was 10.3 and started on Rybelsus. She reports her glucose meter broke and has not been able to check her blood glucose at home.  Diabetes She presents for her follow-up diabetic visit. She has type 2 diabetes mellitus. There are no hypoglycemic associated symptoms. Associated symptoms include foot paresthesias. Pertinent negatives for diabetes include no blurred vision. Symptoms are stable. Risk factors for coronary artery disease include dyslipidemia, diabetes mellitus, hypertension and sedentary lifestyle. She is following a generally healthy diet. (Has not been checking at home) An ACE inhibitor/angiotensin II receptor blocker is being taken.  Flank Pain This is a new problem. The current episode started 1 to 4 weeks ago. The pain is moderate. Associated symptoms include dysuria. (Urinary frequency and hesitancy ) Treatments tried: force fluids. The treatment provided mild relief.     Review of Systems  Eyes:  Negative for blurred vision.  Genitourinary:  Positive for dysuria and flank pain.  All other systems reviewed and are negative.     Objective:   Physical Exam Vitals reviewed.  Constitutional:      General: She is not in acute distress.    Appearance: She is well-developed. She is obese.  HENT:     Head: Normocephalic and atraumatic.     Right Ear: Tympanic membrane normal.     Left Ear: Tympanic membrane normal.  Eyes:     Pupils: Pupils are equal, round, and reactive to light.  Neck:     Thyroid: No thyromegaly.  Cardiovascular:     Rate and Rhythm: Normal rate and regular rhythm.     Heart sounds: Normal heart sounds. No murmur heard. Pulmonary:     Effort: Pulmonary effort is normal. No  respiratory distress.     Breath sounds: Normal breath sounds. No wheezing.  Abdominal:     General: Bowel sounds are normal. There is no distension.     Palpations: Abdomen is soft.     Tenderness: There is no abdominal tenderness.  Musculoskeletal:        General: No tenderness. Normal range of motion.     Cervical back: Normal range of motion and neck supple.  Skin:    General: Skin is warm and dry.  Neurological:     Mental Status: She is alert and oriented to person, place, and time.     Cranial Nerves: No cranial nerve deficit.     Deep Tendon Reflexes: Reflexes are normal and symmetric.  Psychiatric:        Behavior: Behavior normal.        Thought Content: Thought content normal.        Judgment: Judgment normal.      BP 130/77   Pulse (!) 101   Temp 97.8 F (36.6 C) (Temporal)   Ht _0  (1.651 m)   Wt 285 lb (129.3 kg)   BMI 47.43 kg/m      Assessment & Plan:  [name] comes in today with chief complaint of Diabetes and Flank Pain (Right flank pain )   Diagnosis and orders addressed:  1. Flank pain - Urinalysis, Complete - Urine Culture - CMP14+EGFR  2.  Diabetes mellitus type 2 in obese (HCC) - CMP14+EGFR - Bayer DCA Hb A1c Waived  3. Tobacco abuse - CMP14+EGFR  4. Morbid obesity (HCC) - CMP14+EGFR  5. UTI symptoms - cephALEXin (KEFLEX) 500 MG capsule; Take 1 capsule (500 mg total) by mouth 2 (two) times daily.  Dispense: 14 capsule; Refill: 0  6. Vagina, candidiasis - fluconazole (DIFLUCAN) 150 MG tablet; Take 1 tablet (150 mg total) by mouth every three (3) days as needed.  Dispense: 3 tablet; Refill: 0   Labs pending Health Maintenance reviewed Diet and exercise encouraged  Follow up plan: 3 months    Evelina Dun, FNP

## 2021-03-14 ENCOUNTER — Other Ambulatory Visit: Payer: Self-pay | Admitting: Family

## 2021-03-14 LAB — CBC WITH DIFFERENTIAL/PLATELET
Basophils Absolute: 0.1 10*3/uL (ref 0.0–0.2)
Basos: 0 %
EOS (ABSOLUTE): 0.2 10*3/uL (ref 0.0–0.4)
Eos: 1 %
Hematocrit: 45.9 % (ref 34.0–46.6)
Hemoglobin: 13.9 g/dL (ref 11.1–15.9)
Immature Grans (Abs): 0.1 10*3/uL (ref 0.0–0.1)
Immature Granulocytes: 1 %
Lymphocytes Absolute: 3.6 10*3/uL — ABNORMAL HIGH (ref 0.7–3.1)
Lymphs: 23 %
MCH: 23.4 pg — ABNORMAL LOW (ref 26.6–33.0)
MCHC: 30.3 g/dL — ABNORMAL LOW (ref 31.5–35.7)
MCV: 77 fL — ABNORMAL LOW (ref 79–97)
Monocytes Absolute: 0.9 10*3/uL (ref 0.1–0.9)
Monocytes: 6 %
Neutrophils Absolute: 10.9 10*3/uL — ABNORMAL HIGH (ref 1.4–7.0)
Neutrophils: 69 %
Platelets: 312 10*3/uL (ref 150–450)
RBC: 5.93 x10E6/uL — ABNORMAL HIGH (ref 3.77–5.28)
RDW: 16.1 % — ABNORMAL HIGH (ref 11.7–15.4)
WBC: 15.7 10*3/uL — ABNORMAL HIGH (ref 3.4–10.8)

## 2021-03-14 LAB — IRON,TIBC AND FERRITIN PANEL
Ferritin: 29 ng/mL (ref 15–150)
Iron Saturation: 9 % — CL (ref 15–55)
Iron: 41 ug/dL (ref 27–159)
Total Iron Binding Capacity: 469 ug/dL — ABNORMAL HIGH (ref 250–450)
UIBC: 428 ug/dL — ABNORMAL HIGH (ref 131–425)

## 2021-03-14 MED ORDER — INSULIN DEGLUDEC 100 UNIT/ML ~~LOC~~ SOPN: 10.0000 [IU] | PEN_INJECTOR | Freq: Every day | SUBCUTANEOUS | 2 refills | Status: DC

## 2021-03-16 ENCOUNTER — Telehealth: Payer: Self-pay | Admitting: Family Medicine

## 2021-03-16 LAB — URINE CULTURE

## 2021-03-16 MED ORDER — LANTUS SOLOSTAR 100 UNIT/ML ~~LOC~~ SOPN: 10.0000 [IU] | PEN_INJECTOR | Freq: Every day | SUBCUTANEOUS | 99 refills | Status: DC

## 2021-03-16 NOTE — Telephone Encounter (Signed)
PA started for Tresiba flex touch   Your information has been sent to [place] [employer].
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
## 2021-03-16 NOTE — Telephone Encounter (Signed)
Lantus Prescription sent to pharmacy.

## 2021-03-16 NOTE — Telephone Encounter (Signed)
PA Case: [id number], Status: Denied. Notification: Completed.

## 2021-03-16 NOTE — Telephone Encounter (Signed)
Line busy 7/1 jr

## 2021-03-16 NOTE — Telephone Encounter (Signed)
Attempted to contact patient - line busy 

## 2021-03-20 ENCOUNTER — Telehealth: Payer: Self-pay | Admitting: *Deleted

## 2021-03-20 NOTE — Telephone Encounter (Signed)
Patient pharmacy [facility] [address] in [place] faxed refill request for patient prazosin (MINIPRESS) 1 MG capsule.

## 2021-03-20 NOTE — Telephone Encounter (Signed)
Will hold until tomorrow's appt (she would have enough)

## 2021-03-20 NOTE — Telephone Encounter (Signed)
Called patient and informed her with what provider stated and patient verbalized understanding and agreed.

## 2021-03-21 ENCOUNTER — Other Ambulatory Visit: Payer: Self-pay

## 2021-03-21 ENCOUNTER — Encounter: Payer: Self-pay | Admitting: Psychiatry

## 2021-03-21 ENCOUNTER — Telehealth (INDEPENDENT_AMBULATORY_CARE_PROVIDER_SITE_OTHER): Payer: Medicaid Other | Admitting: Psychiatry

## 2021-03-21 DIAGNOSIS — F321 Major depressive disorder, single episode, moderate: Secondary | ICD-10-CM

## 2021-03-21 DIAGNOSIS — F4310 Post-traumatic stress disorder, unspecified: Secondary | ICD-10-CM | POA: Diagnosis not present

## 2021-03-21 MED ORDER — ESCITALOPRAM OXALATE 20 MG PO TABS: 20.0000 mg | ORAL_TABLET | Freq: Every day | ORAL | 0 refills | Status: DC

## 2021-03-21 MED ORDER — PRAZOSIN HCL 1 MG PO CAPS: 3.0000 mg | ORAL_CAPSULE | Freq: Every day | ORAL | 0 refills | Status: DC

## 2021-03-22 ENCOUNTER — Encounter: Payer: Self-pay | Admitting: Emergency Medicine

## 2021-03-22 ENCOUNTER — Ambulatory Visit
Admission: EM | Admit: 2021-03-22 | Discharge: 2021-03-22 | Disposition: A | Discharge status: Home / Self Care | Payer: Medicaid Other | Attending: Emergency Medicine | Admitting: Emergency Medicine

## 2021-03-22 DIAGNOSIS — R0981 Nasal congestion: Secondary | ICD-10-CM | POA: Diagnosis not present

## 2021-03-22 MED ORDER — METHYLPREDNISOLONE SODIUM SUCC 125 MG IJ SOLR
80.0000 mg | Freq: Once | INTRAMUSCULAR | Status: AC
  Administered 2021-03-22: 80 mg via INTRAMUSCULAR

## 2021-03-22 MED ORDER — AMOXICILLIN-POT CLAVULANATE 875-125 MG PO TABS: 1.0000 | ORAL_TABLET | Freq: Two times a day (BID) | ORAL | 0 refills | Status: DC

## 2021-03-22 NOTE — ED Triage Notes (Signed)
Pt reports she has a sinus infection again.  LT earache, sinus drainage and congestion.  S/s started Monday.

## 2021-03-22 NOTE — Discharge Instructions (Addendum)
Get plenty of rest and push fluids Steroid shot  Augmentin for sinus infection Use OTC zyrtec for nasal congestion, runny nose, and/or sore throat Use OTC flonase for nasal congestion and runny nose Use medications daily for symptom relief Use OTC medications like ibuprofen or tylenol as needed fever or pain Call or go to the ED if you have any new or worsening symptoms such as fever, worsening cough, shortness of breath, chest tightness, chest pain, turning blue, changes in mental status, etc..Marland Kitchen

## 2021-03-22 NOTE — ED Provider Notes (Signed)
[place]   [id number] 03/22/21 Arrival Time: 0827   CC: sinus infection  SUBJECTIVE: History from: patient.  Patricia Stanton is a 35 y.o. female who presents with sinus congestion x 4-5 days.  Mother with similar symptoms.  Had negative covid test at home . Denies alleviating or aggravating factors.  Reports previous symptoms in the past with sinus infection.   Denies fever, chills, SOB, wheezing, chest pain, nausea, changes in bowel or bladder habits.    ROS: As per HPI.  All other pertinent ROS negative.     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 Gala Romney: normal esophagus, non-bleeding erosive gastropathy, normal second portion of duodenum. Reactive gastropathy/chemical gastritis, negative H.pylori   ESOPHAGOGASTRODUODENOSCOPY (EGD) WITH PROPOFOL N/A 11/23/2020    Surgeon: Daneil Dolin, MD; normal esophagus, small hiatal hernia, normal examined duodenum.   FEMUR FRACTURE SURGERY Left 1995   tree fell on her   [place] N/A 01/29/2016   Procedure: Fatima Blank HERNIORRHAPHY WITH MESH;  Surgeon: Aviva Signs, MD;  Location: [facility];  Service: General;  Laterality: N/A;   INSERTION OF MESH   01/29/2016   Procedure: INSERTION OF MESH;  Surgeon: Aviva Signs, MD;  Location: [facility];  Service: General;;   KNEE ARTHROSCOPY Left    LUMBAR LAMINECTOMY/DECOMPRESSION MICRODISCECTOMY Right 05/21/2019   Procedure: Microdiscectomy - Lumbar four-Lumbar five - right;  Surgeon: Eustace Moore, MD;  Location: [facility];  Service: Neurosurgery;  Laterality: Right;   TUBAL LIGATION     Allergies  Allergen Reactions   Tramadol Other (See Comments)    Exacerbates her asthma    No current facility-administered medications on file prior to encounter.   Current Outpatient Medications on File Prior to Encounter  Medication Sig Dispense Refill   albuterol (PROVENTIL) (2.5 MG/3ML) 0.083% nebulizer solution Take 3 mLs (2.5 mg total) by nebulization every 6 (six) hours as needed for wheezing or shortness of breath. 150 mL 1   albuterol (VENTOLIN HFA) 108 (90 Base) MCG/ACT inhaler Inhale 2 puffs into the lungs every 6 (six) hours as needed for wheezing or shortness of breath. 18 g 6   budesonide-formoterol (SYMBICORT) 80-4.5 MCG/ACT inhaler Inhale 2 puffs into the lungs 2 (two) times daily. 1 each 3   cephALEXin (KEFLEX) 500 MG capsule Take 1 capsule (500 mg total) by mouth 2 (two) times daily. 14 capsule 0   cetirizine (ZYRTEC) 10 MG tablet Take 1 tablet (10 mg total) by mouth daily. 90 tablet 1   escitalopram (LEXAPRO) 20 MG tablet Take 1 tablet (20 mg total) by mouth  daily. 90 tablet 0   fluconazole (DIFLUCAN) 150 MG tablet Take 1 tablet (150 mg total) by mouth every three (3) days as needed. 3 tablet 0   gabapentin (NEURONTIN) 600 MG tablet Take 600 mg by mouth 3 (three) times daily.     insulin glargine (LANTUS SOLOSTAR) 100 UNIT/ML Solostar Pen Inject 10 Units into the skin daily. 15 mL PRN   lisinopril-hydrochlorothiazide (ZESTORETIC) 20-25 MG tablet Take 1 tablet by mouth daily. 90 tablet 1   lovastatin (MEVACOR) 20 MG tablet TAKE 1 TABLET(20 MG) BY MOUTH AT BEDTIME 90 tablet 1   metFORMIN (GLUCOPHAGE  XR) 500 MG 24 hr tablet Take 2 tablets (1,000 mg total) by mouth 2 (two) times daily. 360 tablet 1   methocarbamol (ROBAXIN) 500 MG tablet Take 500 mg by mouth 3 (three) times daily as needed for muscle spasms.     oxyCODONE-acetaminophen (PERCOCET/ROXICET) 5-325 MG tablet Take 1 tablet by mouth every 4 (four) hours as needed for moderate pain. 30 tablet 0   pantoprazole (PROTONIX) 40 MG tablet Take 1 tablet (40 mg total) by mouth 2 (two) times daily. 60 tablet 3   prazosin (MINIPRESS) 1 MG capsule Take 3 capsules (3 mg total) by mouth at bedtime. 270 capsule 0   Semaglutide (RYBELSUS) 7 MG TABS Take 7 mg by mouth daily. 90 tablet 1   Social History   Socioeconomic History   Marital status: Divorced    Spouse name: Not on file   Number of children: 1   Years of education: 14   Highest education level: Not on file  Occupational History   Occupation: disability  Tobacco Use   Smoking status: Every Day    Packs/day: 0.50    Years: 11.00    Pack years: 5.50    Types: Cigarettes    Start date: 09/17/2003   Smokeless tobacco: Never  Vaping Use   Vaping Use: Never used  Substance and Sexual Activity   Alcohol use: No   Drug use: No   Sexual activity: Yes    Birth control/protection: Surgical    Comment: BTL  Other Topics Concern   Not on file  Social History Narrative   Disabled from nursing   Lives at home with daughter Patricia Stanton   Social Determinants of Health   Financial Resource Strain: Low Risk    Difficulty of Paying Living Expenses: Not hard at all  Food Insecurity: No Food Insecurity   Worried About Charity fundraiser in the Last Year: Never true   [place] in the Last Year: Never true  Transportation Needs: No Transportation Needs   Lack of Transportation (Medical): No   Lack of Transportation (Non-Medical): No  Physical Activity: Inactive   Days of Exercise per Week: 0 days   Minutes of Exercise per Session: 0 min  Stress: Stress Concern Present   Feeling  of Stress : Very much  Social Connections: Socially Isolated   Frequency of Communication with Friends and Family: More than three times a week   Frequency of Social Gatherings with Friends and Family: More than three times a week   Attends Religious Services: Never   Marine scientist or Organizations: No   Attends Archivist Meetings: Never   Marital Status: Never married  Human resources officer Violence: Not At Risk   Fear of Current or Ex-Partner: No   Emotionally Abused: No   Physically Abused: No   Sexually Abused: No   Family History  Problem  Relation Age of Onset   Hypertension Mother    Hyperlipidemia Mother    Fibromyalgia Mother    Other Mother        degenerative disc disease   Arthritis Mother    Kidney disease Mother    Diabetes Father    Hypertension Father    Hyperlipidemia Father    Heart disease Father 11   Heart attack Father    Stroke Father    Hyperlipidemia Brother    Hypertension Brother    Aneurysm Maternal Grandmother        AAA   Kidney disease Maternal Grandmother    Kidney disease Maternal Grandfather    Aneurysm Paternal Grandmother        AAA   Kidney disease Paternal Grandmother    Kidney disease Paternal Grandfather    Colon cancer Neg Hx    Inflammatory bowel disease Neg Hx     OBJECTIVE:  Vitals:   03/22/21 0838  BP: (!) 154/96  Pulse: (!) 102  Resp: 19  Temp: 97.7 F (36.5 C)  TempSrc: Temporal  SpO2: 95%    General appearance: alert; well-appearing, nontoxic; speaking in full sentences and tolerating own secretions HEENT: NCAT; Ears: EACs clear, TMs pearly gray; Eyes: PERRL.  EOM grossly intact.Nose: nares patent without rhinorrhea, Throat: oropharynx clear, tonsils non erythematous or enlarged, uvula midline  Neck: supple without LAD Lungs: unlabored respirations, symmetrical air entry; cough: absent; no respiratory distress; CTAB Heart: regular rate and rhythm.  Skin: warm and dry Psychological: alert and  cooperative; normal mood and affect   ASSESSMENT & PLAN:  1. Sinus congestion     Meds ordered this encounter  Medications   methylPREDNISolone sodium succinate (SOLU-MEDROL) 125 mg/2 mL injection 80 mg   amoxicillin-clavulanate (AUGMENTIN) 875-125 MG tablet    Sig: Take 1 tablet by mouth every 12 (twelve) hours for 10 days.    Dispense:  20 tablet    Refill:  0    Order Specific Question:   Supervising Provider    Answer:   Raylene Everts [[id number]]    Get plenty of rest and push fluids Steroid shot  Augmentin for sinus infection Use OTC zyrtec for nasal congestion, runny nose, and/or sore throat Use OTC flonase for nasal congestion and runny nose Use medications daily for symptom relief Use OTC medications like ibuprofen or tylenol as needed fever or pain Call or go to the ED if you have any new or worsening symptoms such as fever, worsening cough, shortness of breath, chest tightness, chest pain, turning blue, changes in mental status, etc...   Reviewed expectations re: course of current medical issues. Questions answered. Outlined signs and symptoms indicating need for more acute intervention. Patient verbalized understanding. After Visit Summary given.          Stacey Drain [place], PA-C 03/22/21 [phone]

## 2021-04-05 ENCOUNTER — Ambulatory Visit: Payer: Medicaid Other | Admitting: Gastroenterology

## 2021-04-13 ENCOUNTER — Telehealth: Payer: Self-pay | Admitting: Internal Medicine

## 2021-04-13 ENCOUNTER — Other Ambulatory Visit: Payer: Self-pay | Admitting: Gastroenterology

## 2021-04-13 DIAGNOSIS — K5900 Constipation, unspecified: Secondary | ICD-10-CM

## 2021-04-13 MED ORDER — LINACLOTIDE 145 MCG PO CAPS: 145.0000 ug | ORAL_CAPSULE | Freq: Every day | ORAL | 5 refills | Status: DC

## 2021-04-13 NOTE — Telephone Encounter (Signed)
Rx sent 

## 2021-04-13 NOTE — Telephone Encounter (Signed)
Patient called and said the linzess samples worked and she would like a prescription sent to her pharmacy

## 2021-04-13 NOTE — Telephone Encounter (Signed)
Pt. Called requesting Linzess 141mg be sent to pharmacy. She stated that the L[place]samples worked well for her.

## 2021-04-16 NOTE — Telephone Encounter (Signed)
Noted  

## 2021-05-11 ENCOUNTER — Ambulatory Visit
Admission: EM | Admit: 2021-05-11 | Discharge: 2021-05-11 | Disposition: A | Discharge status: Home / Self Care | Payer: Medicaid Other | Attending: Emergency Medicine | Admitting: Emergency Medicine

## 2021-05-11 ENCOUNTER — Other Ambulatory Visit: Payer: Self-pay

## 2021-05-11 ENCOUNTER — Encounter: Payer: Self-pay | Admitting: Emergency Medicine

## 2021-05-11 DIAGNOSIS — R300 Dysuria: Secondary | ICD-10-CM | POA: Diagnosis not present

## 2021-05-11 LAB — POCT URINALYSIS DIP (MANUAL ENTRY)
Bilirubin, UA: NEGATIVE
Glucose, UA: >=1000 mg/dL — AB
Leukocytes, UA: NEGATIVE
Nitrite, UA: POSITIVE — AB
Protein Ur, POC: NEGATIVE mg/dL
Spec Grav, UA: 1.02 (ref 1.010–1.025)
Urobilinogen, UA: 0.2 E.U./dL
pH, UA: 5.5 (ref 5.0–8.0)

## 2021-05-11 MED ORDER — CEPHALEXIN 500 MG PO CAPS: 500.0000 mg | ORAL_CAPSULE | Freq: Two times a day (BID) | ORAL | 0 refills | Status: AC

## 2021-05-11 NOTE — ED Provider Notes (Signed)
[facility]   CC: Burning with urination  SUBJECTIVE:  Patricia Stanton is a 35 y.o. female who complains of dysuria and brown vaginal discharge x 1 week.  Patient denies a precipitating event, recent sexual encounter, excessive caffeine intake.  Complains of RT sided flank pain.  Denies alleviating factors.  Worse with urination.  Admits to similar symptoms in the past.  Denies fever, chills, nausea, vomiting, abdominal pain, hematuria.  Denies concern for STDs, BV or yeast.  Reports brown vaginal discharge with UTIs in the past.    LMP: Patient's last menstrual period was 04/18/2021 (approximate).  ROS: As in HPI.  All other pertinent ROS negative.     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 Gala Romney: normal esophagus, non-bleeding erosive gastropathy, normal second portion of duodenum. Reactive gastropathy/chemical gastritis, negative H.pylori   ESOPHAGOGASTRODUODENOSCOPY (EGD) WITH PROPOFOL N/A 11/23/2020    Surgeon: Daneil Dolin, MD; normal esophagus, small hiatal hernia, normal examined duodenum.   FEMUR FRACTURE SURGERY Left 1995   tree fell on her   [place] N/A 01/29/2016   Procedure:  Fatima Blank HERNIORRHAPHY WITH MESH;  Surgeon: Aviva Signs, MD;  Location: [facility];  Service: General;  Laterality: N/A;   INSERTION OF MESH  01/29/2016   Procedure: INSERTION OF MESH;  Surgeon: Aviva Signs, MD;  Location: [facility];  Service: General;;   KNEE ARTHROSCOPY Left    LUMBAR LAMINECTOMY/DECOMPRESSION MICRODISCECTOMY Right 05/21/2019   Procedure: Microdiscectomy - Lumbar four-Lumbar five - right;  Surgeon: Eustace Moore, MD;  Location: [facility];  Service: Neurosurgery;  Laterality: Right;   TUBAL LIGATION     Allergies  Allergen Reactions   Tramadol Other (See Comments)    Exacerbates her asthma    No current facility-administered medications on file prior to encounter.   Current Outpatient Medications on File Prior to Encounter  Medication Sig Dispense Refill   albuterol (PROVENTIL) (2.5 MG/3ML) 0.083% nebulizer solution Take 3 mLs (2.5 mg total) by nebulization every 6 (six) hours as needed for wheezing or shortness of breath. 150 mL 1   albuterol (VENTOLIN HFA) 108 (90 Base) MCG/ACT inhaler Inhale 2 puffs into the lungs every 6 (six) hours as needed for wheezing or shortness of breath. 18 g 6   budesonide-formoterol (SYMBICORT) 80-4.5 MCG/ACT inhaler Inhale 2 puffs into the lungs 2 (two) times daily. 1 each 3   cetirizine (ZYRTEC) 10 MG tablet Take 1 tablet (10 mg total) by mouth daily. 90 tablet 1   escitalopram (LEXAPRO) 20 MG tablet Take 1 tablet (20 mg total) by mouth daily.  90 tablet 0   fluconazole (DIFLUCAN) 150 MG tablet Take 1 tablet (150 mg total) by mouth every three (3) days as needed. 3 tablet 0   gabapentin (NEURONTIN) 600 MG tablet Take 600 mg by mouth 3 (three) times daily.     insulin glargine (LANTUS SOLOSTAR) 100 UNIT/ML Solostar Pen Inject 10 Units into the skin daily. 15 mL PRN   linaclotide (LINZESS) 145 MCG CAPS capsule Take 1 capsule (145 mcg total) by mouth daily before breakfast. 30 capsule 5   lisinopril-hydrochlorothiazide (ZESTORETIC) 20-25 MG tablet Take  1 tablet by mouth daily. 90 tablet 1   lovastatin (MEVACOR) 20 MG tablet TAKE 1 TABLET(20 MG) BY MOUTH AT BEDTIME 90 tablet 1   metFORMIN (GLUCOPHAGE XR) 500 MG 24 hr tablet Take 2 tablets (1,000 mg total) by mouth 2 (two) times daily. 360 tablet 1   methocarbamol (ROBAXIN) 500 MG tablet Take 500 mg by mouth 3 (three) times daily as needed for muscle spasms.     oxyCODONE-acetaminophen (PERCOCET/ROXICET) 5-325 MG tablet Take 1 tablet by mouth every 4 (four) hours as needed for moderate pain. 30 tablet 0   pantoprazole (PROTONIX) 40 MG tablet Take 1 tablet (40 mg total) by mouth 2 (two) times daily. 60 tablet 3   prazosin (MINIPRESS) 1 MG capsule Take 3 capsules (3 mg total) by mouth at bedtime. 270 capsule 0   Semaglutide (RYBELSUS) 7 MG TABS Take 7 mg by mouth daily. 90 tablet 1   Social History   Socioeconomic History   Marital status: Divorced    Spouse name: Not on file   Number of children: 1   Years of education: 14   Highest education level: Not on file  Occupational History   Occupation: disability  Tobacco Use   Smoking status: Every Day    Packs/day: 0.50    Years: 11.00    Pack years: 5.50    Types: Cigarettes    Start date: 09/17/2003   Smokeless tobacco: Never  Vaping Use   Vaping Use: Never used  Substance and Sexual Activity   Alcohol use: No   Drug use: No   Sexual activity: Yes    Birth control/protection: Surgical    Comment: BTL  Other Topics Concern   Not on file  Social History Narrative   Disabled from nursing   Lives at home with daughter [name]   Social Determinants of Health   Financial Resource Strain: Low Risk    Difficulty of Paying Living Expenses: Not hard at all  Food Insecurity: No Food Insecurity   Worried About Charity fundraiser in the Last Year: Never true   [place] in the Last Year: Never true  Transportation Needs: No Transportation Needs   Lack of Transportation (Medical): No   Lack of Transportation (Non-Medical): No   Physical Activity: Inactive   Days of Exercise per Week: 0 days   Minutes of Exercise per Session: 0 min  Stress: Stress Concern Present   Feeling of Stress : Very much  Social Connections: Socially Isolated   Frequency of Communication with Friends and Family: More than three times a week   Frequency of Social Gatherings with Friends and Family: More than three times a week   Attends Religious Services: Never   Marine scientist or Organizations: No   Attends Archivist Meetings: Never   Marital Status: Never married  Human resources officer Violence: Not At Risk   Fear of Current or Ex-Partner: No  Emotionally Abused: No   Physically Abused: No   Sexually Abused: No   Family History  Problem Relation Age of Onset   Hypertension Mother    Hyperlipidemia Mother    Fibromyalgia Mother    Other Mother        degenerative disc disease   Arthritis Mother    Kidney disease Mother    Diabetes Father    Hypertension Father    Hyperlipidemia Father    Heart disease Father 63   Heart attack Father    Stroke Father    Hyperlipidemia Brother    Hypertension Brother    Aneurysm Maternal Grandmother        AAA   Kidney disease Maternal Grandmother    Kidney disease Maternal Grandfather    Aneurysm Paternal Grandmother        AAA   Kidney disease Paternal Grandmother    Kidney disease Paternal Grandfather    Colon cancer Neg Hx    Inflammatory bowel disease Neg Hx     OBJECTIVE:  Vitals:   05/11/21 1632  BP: 140/84  Pulse: (!) 110  Resp: 18  Temp: 98.8 F (37.1 C)  TempSrc: Oral  SpO2: 96%   General appearance: AOx3 in no acute distress HEENT: NCAT.  Oropharynx clear.  Lungs: clear to auscultation bilaterally without adventitious breath sounds Heart: regular rate and rhythm.  Abdomen: soft; non-distended; no tenderness; bowel sounds present; no guarding Back: RT sided CVA tenderness Extremities: no edema; symmetrical with no gross deformities Skin: warm  and dry Neurologic: Ambulates from chair to exam table without difficulty Psychological: alert and cooperative; normal mood and affect  Labs Reviewed  POCT URINALYSIS DIP (MANUAL ENTRY) - Abnormal; Notable for the following components:      Result Value   Glucose, UA >=1,000 (*)    Ketones, POC UA trace (5) (*)    Blood, UA small (*)    Nitrite, UA Positive (*)    All other components within normal limits  URINE CULTURE    ASSESSMENT & PLAN:  1. Dysuria     Meds ordered this encounter  Medications   cephALEXin (KEFLEX) 500 MG capsule    Sig: Take 1 capsule (500 mg total) by mouth 2 (two) times daily for 10 days.    Dispense:  20 capsule    Refill:  0    Order Specific Question:   Supervising Provider    Answer:   Raylene Everts [id number]   Urine concerning for infection Glucose in urine.  Declines glucose check in office.  Please push fluids, go to the ED if sugar is elevated Urine culture sent.  We will call you with the results.   Push fluids and get plenty of rest.   Take antibiotic as directed and to completion Follow up with PCP if symptoms persists Return here or go to ER if you have any new or worsening symptoms such as fever, worsening abdominal pain, nausea/vomiting, flank pain, etc...  Outlined signs and symptoms indicating need for more acute intervention. Patient verbalized understanding. After Visit Summary given.      Lestine Box, PA-C 05/11/21 [phone]

## 2021-05-11 NOTE — Discharge Instructions (Addendum)
Urine concerning for infection Glucose in urine.  Declines glucose check in office.  Please push fluids, go to the ED if sugar is elevated Urine culture sent.  We will call you with the results.   Push fluids and get plenty of rest.   Take antibiotic as directed and to completion Follow up with PCP if symptoms persists Return here or go to ER if you have any new or worsening symptoms such as fever, worsening abdominal pain, nausea/vomiting, flank pain, etc..Marland Kitchen

## 2021-05-11 NOTE — ED Triage Notes (Signed)
Brown discharge and burning on urination x 1 week.  Back pain on right side

## 2021-05-13 LAB — URINE CULTURE: Culture: 30000 — AB

## 2021-05-15 ENCOUNTER — Ambulatory Visit: Payer: Medicaid Other | Admitting: Pharmacist

## 2021-05-29 ENCOUNTER — Other Ambulatory Visit: Payer: Self-pay

## 2021-05-29 ENCOUNTER — Ambulatory Visit
Admission: EM | Admit: 2021-05-29 | Discharge: 2021-05-29 | Disposition: A | Discharge status: Home / Self Care | Payer: Medicaid Other

## 2021-05-29 ENCOUNTER — Telehealth: Payer: Self-pay | Admitting: *Deleted

## 2021-05-29 DIAGNOSIS — Z760 Encounter for issue of repeat prescription: Secondary | ICD-10-CM | POA: Diagnosis not present

## 2021-05-29 DIAGNOSIS — B373 Candidiasis of vulva and vagina: Secondary | ICD-10-CM

## 2021-05-29 MED ORDER — FLUCONAZOLE 150 MG PO TABS: 150.0000 mg | ORAL_TABLET | Freq: Once | ORAL | 0 refills | Status: AC

## 2021-05-29 NOTE — ED Triage Notes (Signed)
Pt presents with c/o yeast infection after completing antibiotics

## 2021-05-29 NOTE — ED Notes (Signed)
Diflucan called in for patient.

## 2021-06-01 ENCOUNTER — Telehealth: Payer: Self-pay | Admitting: Emergency Medicine

## 2021-06-01 ENCOUNTER — Ambulatory Visit: Payer: Medicaid Other

## 2021-06-01 DIAGNOSIS — B373 Candidiasis of vulva and vagina: Secondary | ICD-10-CM

## 2021-06-01 DIAGNOSIS — B3731 Acute candidiasis of vulva and vagina: Secondary | ICD-10-CM

## 2021-06-01 MED ORDER — FLUCONAZOLE 150 MG PO TABS: 150.0000 mg | ORAL_TABLET | ORAL | 0 refills | Status: DC | PRN

## 2021-06-04 ENCOUNTER — Other Ambulatory Visit: Payer: Self-pay | Admitting: Family

## 2021-06-11 ENCOUNTER — Ambulatory Visit (INDEPENDENT_AMBULATORY_CARE_PROVIDER_SITE_OTHER): Payer: Medicaid Other

## 2021-06-11 ENCOUNTER — Other Ambulatory Visit: Payer: Self-pay

## 2021-06-11 ENCOUNTER — Ambulatory Visit: Payer: Medicaid Other | Admitting: Podiatry

## 2021-06-11 DIAGNOSIS — M21612 Bunion of left foot: Secondary | ICD-10-CM | POA: Diagnosis not present

## 2021-06-11 DIAGNOSIS — E1149 Type 2 diabetes mellitus with other diabetic neurological complication: Secondary | ICD-10-CM

## 2021-06-11 DIAGNOSIS — M79672 Pain in left foot: Secondary | ICD-10-CM

## 2021-06-11 DIAGNOSIS — M21611 Bunion of right foot: Secondary | ICD-10-CM

## 2021-06-11 DIAGNOSIS — M79671 Pain in right foot: Secondary | ICD-10-CM | POA: Diagnosis not present

## 2021-06-11 DIAGNOSIS — E1165 Type 2 diabetes mellitus with hyperglycemia: Secondary | ICD-10-CM | POA: Diagnosis not present

## 2021-06-11 DIAGNOSIS — IMO0002 Reserved for concepts with insufficient information to code with codable children: Secondary | ICD-10-CM

## 2021-06-11 NOTE — Progress Notes (Unsigned)
e

## 2021-06-11 NOTE — Patient Instructions (Signed)

## 2021-06-11 NOTE — Progress Notes (Signed)
Subjective:   Patient ID: Patricia Stanton, female   DOB: 35 y.o.   MRN: [id number]   HPI 35 year old female presents the office today for diabetic foot exam as well as with numbness and tingling to both of her feet.  She had sharp pain that radiates up her legs.  She does see a pain doctor as well and she is working to try to get injections in her back however she is having difficulty with insurance.  Denies any ulcerations or any claudication symptoms.  Last A1c was 10.2 but she does not check her blood sugar on a regular basis.  She does not want any insulin for her diabetes.  A1c 10.2 on 03/13/2021  Review of Systems  All other systems reviewed and are negative.  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 Gala Romney: normal esophagus, non-bleeding erosive gastropathy, normal second portion of duodenum. Reactive gastropathy/chemical gastritis, negative H.pylori   ESOPHAGOGASTRODUODENOSCOPY (EGD) WITH PROPOFOL N/A 11/23/2020    Surgeon: Daneil Dolin, MD; normal esophagus, small hiatal hernia, normal examined duodenum.   FEMUR FRACTURE SURGERY Left 1995   tree fell on her   [place] N/A 01/29/2016   Procedure:  Fatima Blank HERNIORRHAPHY WITH MESH;  Surgeon: Aviva Signs, MD;  Location: [facility];  Service: General;  Laterality: N/A;   INSERTION OF MESH  01/29/2016   Procedure: INSERTION OF MESH;  Surgeon: Aviva Signs, MD;  Location: [facility];  Service: General;;   KNEE ARTHROSCOPY Left    LUMBAR LAMINECTOMY/DECOMPRESSION MICRODISCECTOMY Right 05/21/2019   Procedure: Microdiscectomy - Lumbar four-Lumbar five - right;  Surgeon: Eustace Moore, MD;  Location: [facility];  Service: Neurosurgery;  Laterality: Right;   TUBAL LIGATION       Current Outpatient Medications:    albuterol (PROVENTIL) (2.5 MG/3ML) 0.083% nebulizer solution, Take 3 mLs (2.5 mg total) by nebulization every 6 (six) hours as needed for wheezing or shortness of breath., Disp: 150 mL, Rfl: 1   albuterol (VENTOLIN HFA) 108 (90 Base) MCG/ACT inhaler, Inhale 2 puffs into the lungs every 6 (six) hours as needed for wheezing or shortness of breath., Disp: 18 g, Rfl: 6   budesonide-formoterol (SYMBICORT) 80-4.5 MCG/ACT inhaler, Inhale 2 puffs into the lungs 2 (two) times daily., Disp: 1 each, Rfl: 3   cetirizine (ZYRTEC) 10 MG tablet, Take 1 tablet (10 mg total) by mouth daily., Disp: 90 tablet, Rfl: 1   escitalopram (LEXAPRO) 20 MG tablet, Take 1 tablet (20 mg total) by mouth daily., Disp: 90 tablet, Rfl: 0   fluconazole (DIFLUCAN) 150 MG tablet, Take  1 tablet (150 mg total) by mouth every three (3) days as needed., Disp: 3 tablet, Rfl: 0   gabapentin (NEURONTIN) 600 MG tablet, Take 600 mg by mouth 3 (three) times daily., Disp: , Rfl:    insulin glargine (LANTUS SOLOSTAR) 100 UNIT/ML Solostar Pen, Inject 10 Units into the skin daily., Disp: 15 mL, Rfl: PRN   linaclotide (LINZESS) 145 MCG CAPS capsule, Take 1 capsule (145 mcg total) by mouth daily before breakfast., Disp: 30 capsule, Rfl: 5   lisinopril-hydrochlorothiazide (ZESTORETIC) 20-25 MG tablet, Take 1 tablet by mouth daily., Disp: 90 tablet, Rfl: 1   lovastatin (MEVACOR) 20 MG tablet, TAKE 1  TABLET(20 MG) BY MOUTH AT BEDTIME, Disp: 90 tablet, Rfl: 0   metFORMIN (GLUCOPHAGE XR) 500 MG 24 hr tablet, Take 2 tablets (1,000 mg total) by mouth 2 (two) times daily., Disp: 360 tablet, Rfl: 1   methocarbamol (ROBAXIN) 500 MG tablet, Take 500 mg by mouth 3 (three) times daily as needed for muscle spasms., Disp: , Rfl:    oxyCODONE-acetaminophen (PERCOCET/ROXICET) 5-325 MG tablet, Take 1 tablet by mouth every 4 (four) hours as needed for moderate pain., Disp: 30 tablet, Rfl: 0   pantoprazole (PROTONIX) 40 MG tablet, Take 1 tablet (40 mg total) by mouth 2 (two) times daily., Disp: 60 tablet, Rfl: 3   prazosin (MINIPRESS) 1 MG capsule, Take 3 capsules (3 mg total) by mouth at bedtime., Disp: 270 capsule, Rfl: 0   Semaglutide (RYBELSUS) 7 MG TABS, Take 7 mg by mouth daily., Disp: 90 tablet, Rfl: 1  Allergies  Allergen Reactions   Tramadol Other (See Comments)    Exacerbates her asthma           Objective:  Physical Exam  General: AAO x3, NAD  Dermatological: Skin is warm, dry and supple bilateral.  There are no open sores, no preulcerative lesions, no rash or signs of infection present.  Vascular: Dorsalis Pedis artery and Posterior Tibial artery pedal pulses are 2/4 bilateral with immedate capillary fill time.  There is no pain with calf compression, swelling, warmth, erythema.   Neruologic: Sensation decreased with Semmes Weinstein monofilament as well as vibratory sensation bilaterally.  Musculoskeletal: There is no area of tenderness identified bilaterally.  Bunion present.  Muscular strength 5/5 in all groups tested bilateral.  Gait: Unassisted, Nonantalgic.       Assessment:   35 year old female with uncontrolled diabetes and neuropathy     Plan:  -Treatment options discussed including all alternatives, risks, and complications -Etiology of symptoms were discussed -X-rays were obtained and reviewed with the patient for baseline.  There is no evidence of acute fracture.   Bunion, tailor's bunion present. -I discussed the importance of glucose control and likely a big contributor to her nerve symptoms.  Also her back issues could also be contributing to her leg and foot pain and hopefully she get the injections in her back by her pain doctor soon as I do think is beneficial for her. -Discussed daily foot inspection.  Return in about 6 months (around 12/09/2021).  Trula Slade DPM

## 2021-06-13 ENCOUNTER — Ambulatory Visit: Payer: Medicaid Other

## 2021-06-14 ENCOUNTER — Ambulatory Visit: Payer: Medicaid Other | Admitting: Family

## 2021-06-17 ENCOUNTER — Other Ambulatory Visit: Payer: Self-pay | Admitting: Family

## 2021-06-17 DIAGNOSIS — E1169 Type 2 diabetes mellitus with other specified complication: Secondary | ICD-10-CM
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
## 2021-06-19 ENCOUNTER — Encounter: Payer: Self-pay | Admitting: Psychiatry

## 2021-06-19 ENCOUNTER — Telehealth (INDEPENDENT_AMBULATORY_CARE_PROVIDER_SITE_OTHER): Payer: Medicaid Other | Admitting: Psychiatry

## 2021-06-19 ENCOUNTER — Other Ambulatory Visit: Payer: Self-pay

## 2021-06-19 ENCOUNTER — Telehealth (HOSPITAL_COMMUNITY): Payer: Self-pay | Admitting: Psychiatry

## 2021-06-19 DIAGNOSIS — F4310 Post-traumatic stress disorder, unspecified: Secondary | ICD-10-CM | POA: Diagnosis not present

## 2021-06-19 DIAGNOSIS — F321 Major depressive disorder, single episode, moderate: Secondary | ICD-10-CM

## 2021-06-19 MED ORDER — PRAZOSIN HCL 1 MG PO CAPS: 3.0000 mg | ORAL_CAPSULE | Freq: Every day | ORAL | 0 refills | Status: DC

## 2021-06-19 MED ORDER — ESCITALOPRAM OXALATE 20 MG PO TABS: 20.0000 mg | ORAL_TABLET | Freq: Every day | ORAL | 0 refills | Status: DC

## 2021-06-19 NOTE — Telephone Encounter (Signed)
Call to schedule NP appt, was able to leave vm

## 2021-06-19 NOTE — Patient Instructions (Signed)
1. Continue lexapro 20 mg daily 2. Continue prazosin 3 mg at night 3. Next appointment: 1/3 at 1:40

## 2021-06-20 ENCOUNTER — Ambulatory Visit: Payer: Medicaid Other | Admitting: Podiatry

## 2021-06-22 ENCOUNTER — Other Ambulatory Visit: Payer: Self-pay

## 2021-06-22 ENCOUNTER — Ambulatory Visit: Payer: Medicaid Other | Admitting: Pharmacist

## 2021-06-22 DIAGNOSIS — B3731 Acute candidiasis of vulva and vagina: Secondary | ICD-10-CM

## 2021-06-22 DIAGNOSIS — E669 Obesity, unspecified: Secondary | ICD-10-CM

## 2021-06-22 DIAGNOSIS — E1169 Type 2 diabetes mellitus with other specified complication: Secondary | ICD-10-CM

## 2021-06-22 MED ORDER — LINAGLIPTIN 5 MG PO TABS: 5.0000 mg | ORAL_TABLET | Freq: Every day | ORAL | 3 refills | Status: DC

## 2021-06-22 MED ORDER — FLUCONAZOLE 150 MG PO TABS: 150.0000 mg | ORAL_TABLET | ORAL | 0 refills | Status: DC | PRN

## 2021-06-22 MED ORDER — GLIPIZIDE ER 5 MG PO TB24: 5.0000 mg | ORAL_TABLET | Freq: Every day | ORAL | 3 refills | Status: DC

## 2021-06-22 NOTE — Progress Notes (Signed)
    06/22/2021 Name: SHAWNTRICE SALLE MRN: [id number] DOB: 11-Oct-1985   S:  28 yof Presents for diabetes evaluation, education, and management Patient was referred and last seen by Primary Care Provider on 03/13/21.  Insurance coverage/medication affordability: [place] medicaid  Patient denies adherence with medications. Current diabetes medications include: n/a Current hypertension medications include: lisinopril/hctz Goal 130/80 Current hyperlipidemia medications include: lovastatin   Patient denies hypoglycemic events.   Discussed meal planning options and Plate method for healthy eating Avoid sugary drinks and desserts Incorporate balanced protein, non starchy veggies, 1 serving of carbohydrate with each meal Increase water intake Increase physical activity as able  Patient-reported exercise habits: n/a   O:  Lab Results  Component Value Date   HGBA1C 10.2 (H) 03/13/2021    There were no vitals filed for this visit.     Lipid Panel     Component Value Date/Time   CHOL 188 03/25/2019 0842   TRIG 205 (H) 03/25/2019 0842   HDL 42 03/25/2019 0842   CHOLHDL 4.5 (H) 03/25/2019 0842   CHOLHDL 4.8 05/21/2017 0836   VLDL 29 02/14/2017 0842   LDLCALC 105 (H) 03/25/2019 0842   LDLCALC 106 (H) 05/21/2017 0836     Home fasting blood sugars: n/a  2 hour post-meal/random blood sugars: n/a.    Clinical Atherosclerotic Cardiovascular Disease (ASCVD): No   The ASCVD Risk score (Arnett DK, et al., 2019) failed to calculate for the following reasons:   The 2019 ASCVD risk score is only valid for ages 79 to 61    A/P:  Diabetes T2DM currently UNCONTROLLED. Patient is not adherent with medication. Control is suboptimal due to multiple side effects from medications/non compliance.  -Patient states she will not take insulin or any injectable  -She has discontinued rybelsus, metformin   -Will start tradjenta and glipizide --these are the only two medications left to try to  control here blood sugar  -Patient states she is trying to do better with diet at home;  she is unable to work out due to injuries  -Extensively discussed pathophysiology of diabetes, recommended lifestyle interventions, dietary effects on blood sugar control  -Counseled on s/sx of and management of hypoglycemia  -Next A1C anticipated 3 months.   Written patient instructions provided.  Total time in face to face counseling 25 minutes.   Follow up PCP Clinic Visit ON 06/25/21.   Regina Eck, PharmD, BCPS Clinical Pharmacist, [place]  II Phone [phone]

## 2021-06-25 ENCOUNTER — Other Ambulatory Visit: Payer: Self-pay

## 2021-06-25 ENCOUNTER — Ambulatory Visit (INDEPENDENT_AMBULATORY_CARE_PROVIDER_SITE_OTHER): Payer: Medicaid Other

## 2021-06-25 ENCOUNTER — Encounter: Payer: Self-pay | Admitting: Family

## 2021-06-25 ENCOUNTER — Ambulatory Visit: Payer: Medicaid Other | Admitting: Family

## 2021-06-25 VITALS — BP 113/62 | HR 97 | Temp 97.8°F | Ht 65.0 in | Wt 281.4 lb

## 2021-06-25 DIAGNOSIS — F331 Major depressive disorder, recurrent, moderate: Secondary | ICD-10-CM

## 2021-06-25 DIAGNOSIS — E1169 Type 2 diabetes mellitus with other specified complication: Secondary | ICD-10-CM | POA: Diagnosis not present

## 2021-06-25 DIAGNOSIS — M542 Cervicalgia: Secondary | ICD-10-CM

## 2021-06-25 DIAGNOSIS — J209 Acute bronchitis, unspecified: Secondary | ICD-10-CM | POA: Diagnosis not present

## 2021-06-25 DIAGNOSIS — Z23 Encounter for immunization: Secondary | ICD-10-CM | POA: Diagnosis not present

## 2021-06-25 DIAGNOSIS — G43801 Other migraine, not intractable, with status migrainosus: Secondary | ICD-10-CM

## 2021-06-25 DIAGNOSIS — E1122 Type 2 diabetes mellitus with diabetic chronic kidney disease: Secondary | ICD-10-CM | POA: Diagnosis not present

## 2021-06-25 DIAGNOSIS — J410 Simple chronic bronchitis: Secondary | ICD-10-CM

## 2021-06-25 DIAGNOSIS — K219 Gastro-esophageal reflux disease without esophagitis: Secondary | ICD-10-CM | POA: Diagnosis not present

## 2021-06-25 DIAGNOSIS — Z9889 Other specified postprocedural states: Secondary | ICD-10-CM

## 2021-06-25 DIAGNOSIS — E669 Obesity, unspecified: Secondary | ICD-10-CM | POA: Diagnosis not present

## 2021-06-25 DIAGNOSIS — Z720 Tobacco use: Secondary | ICD-10-CM

## 2021-06-25 DIAGNOSIS — E785 Hyperlipidemia, unspecified: Secondary | ICD-10-CM

## 2021-06-25 DIAGNOSIS — I129 Hypertensive chronic kidney disease with stage 1 through stage 4 chronic kidney disease, or unspecified chronic kidney disease: Secondary | ICD-10-CM

## 2021-06-25 DIAGNOSIS — K5900 Constipation, unspecified: Secondary | ICD-10-CM

## 2021-06-25 LAB — BAYER DCA HB A1C WAIVED: HB A1C (BAYER DCA - WAIVED): 11.2 % — ABNORMAL HIGH (ref 4.8–5.6)

## 2021-06-25 MED ORDER — ALBUTEROL SULFATE (2.5 MG/3ML) 0.083% IN NEBU: 2.5000 mg | INHALATION_SOLUTION | Freq: Four times a day (QID) | RESPIRATORY_TRACT | 1 refills | Status: DC | PRN

## 2021-06-25 MED ORDER — ALBUTEROL SULFATE HFA 108 (90 BASE) MCG/ACT IN AERS: 2.0000 | INHALATION_SPRAY | Freq: Four times a day (QID) | RESPIRATORY_TRACT | 6 refills | Status: DC | PRN

## 2021-06-25 MED ORDER — BUDESONIDE-FORMOTEROL FUMARATE 80-4.5 MCG/ACT IN AERO: 2.0000 | INHALATION_SPRAY | Freq: Two times a day (BID) | RESPIRATORY_TRACT | 3 refills | Status: DC

## 2021-06-25 NOTE — Progress Notes (Signed)
Subjective:    Patient ID: Patricia Stanton, female    DOB: 1986/06/02, 35 y.o.   MRN: [id number]  Chief Complaint  Patient presents with   Medical Management of Chronic Issues   Pt calls the office today today for chronic follow up. She is followed by [facility] every 3 months for PTSD, GAD, and depression. She is followed by Pain Clinic every 3 months for chronic back pain. She is followed by a Neurosurgeon as needed and had lumbar laminectomy surgery on 05/20/2019.   She is followed by GI every 6 months for GERD and constipation.   She reports she is having constantly having posterior neck pain that is causing her to have migraines.  Hypertension This is a chronic problem. The current episode started more than 1 year ago. The problem has been resolved since onset. The problem is controlled. Pertinent negatives include no blurred vision, malaise/fatigue, peripheral edema or shortness of breath. Risk factors for coronary artery disease include dyslipidemia, diabetes mellitus and obesity. The current treatment provides moderate improvement. There is no history of heart failure.  Gastroesophageal Reflux She complains of belching, heartburn and nausea. This is a chronic problem. The current episode started more than 1 year ago. The problem occurs occasionally. Risk factors include obesity. She has tried a PPI for the symptoms.  Hyperlipidemia This is a chronic problem. The current episode started more than 1 year ago. Exacerbating diseases include obesity. Pertinent negatives include no shortness of breath. Current antihyperlipidemic treatment includes statins. Risk factors for coronary artery disease include dyslipidemia, diabetes mellitus, hypertension, a sedentary lifestyle and post-menopausal.  Diabetes She presents for her follow-up diabetic visit. She has type 2 diabetes mellitus. Associated symptoms include foot paresthesias. Pertinent negatives for diabetes include no blurred  vision. Diabetic complications include heart disease. Risk factors for coronary artery disease include dyslipidemia, diabetes mellitus, hypertension and sedentary lifestyle. She is following a generally unhealthy diet. (Does not check bs at home ) Eye exam is not current.  Nicotine Dependence Presents for follow-up visit. Her urge triggers include company of smokers. The symptoms have been stable. She smokes < 1/2 a pack of cigarettes per day.  Depression        This is a chronic problem.  The current episode started more than 1 year ago.   Associated symptoms include decreased interest.  Associated symptoms include no helplessness, no hopelessness and not sad. Constipation This is a chronic problem. The current episode started more than 1 year ago. The problem has been resolved since onset. Her stool frequency is 1 time per day. Associated symptoms include nausea and vomiting. She has tried laxatives for the symptoms. The treatment provided moderate relief.  Migraine  The current episode started more than 1 month ago. The problem has been waxing and waning. Pain location: neck. The pain quality is similar to prior headaches. The quality of the pain is described as aching. The pain is at a severity of 10/10. Associated symptoms include nausea, phonophobia, photophobia and vomiting. Pertinent negatives include no blurred vision. Nothing aggravates the symptoms. She has tried acetaminophen for the symptoms. The treatment provided mild relief. Her past medical history is significant for hypertension and obesity.  COPD Continues to smoke 1/2 pack, Denies any SOB.    Review of Systems  Constitutional:  Negative for malaise/fatigue.  Eyes:  Positive for photophobia. Negative for blurred vision.  Respiratory:  Negative for shortness of breath.   Gastrointestinal:  Positive for constipation, heartburn, nausea and  vomiting.  Psychiatric/Behavioral:  Positive for depression.   All other systems reviewed and  are negative.     Objective:   Physical Exam Vitals reviewed.  Constitutional:      General: She is not in acute distress.    Appearance: She is well-developed. She is obese.  HENT:     Head: Normocephalic and atraumatic.     Right Ear: Tympanic membrane normal.     Left Ear: Tympanic membrane normal.  Eyes:     Pupils: Pupils are equal, round, and reactive to light.  Neck:     Thyroid: No thyromegaly.  Cardiovascular:     Rate and Rhythm: Normal rate and regular rhythm.     Heart sounds: Normal heart sounds. No murmur heard. Pulmonary:     Effort: Pulmonary effort is normal. No respiratory distress.     Breath sounds: Normal breath sounds. No wheezing.  Abdominal:     General: Bowel sounds are normal. There is no distension.     Palpations: Abdomen is soft.     Tenderness: There is no abdominal tenderness.  Musculoskeletal:        General: No tenderness.     Cervical back: Normal range of motion and neck supple.     Comments: Pain in posterior cervical with flexion and extension   Skin:    General: Skin is warm and dry.  Neurological:     Mental Status: She is alert and oriented to person, place, and time.     Cranial Nerves: No cranial nerve deficit.     Deep Tendon Reflexes: Reflexes are normal and symmetric.  Psychiatric:        Behavior: Behavior normal.        Thought Content: Thought content normal.        Judgment: Judgment normal.      BP 113/62   Pulse 97   Temp 97.8 F (36.6 C) (Temporal)   Ht $R'5\' 5"'xo$  (1.651 m)   Wt 281 lb 6.4 oz (127.6 kg)   BMI 46.83 kg/m      Assessment & Plan:  Patricia Stanton comes in today with chief complaint of Medical Management of Chronic Issues   Diagnosis and orders addressed:  1. Acute bronchitis, unspecified organism - budesonide-formoterol (SYMBICORT) 80-4.5 MCG/ACT inhaler; Inhale 2 puffs into the lungs 2 (two) times daily.  Dispense: 1 each; Refill: 3 - CMP14+EGFR - CBC with Differential/Platelet  2. Simple  chronic bronchitis (HCC) - albuterol (VENTOLIN HFA) 108 (90 Base) MCG/ACT inhaler; Inhale 2 puffs into the lungs every 6 (six) hours as needed for wheezing or shortness of breath.  Dispense: 18 g; Refill: 6 - albuterol (PROVENTIL) (2.5 MG/3ML) 0.083% nebulizer solution; Take 3 mLs (2.5 mg total) by nebulization every 6 (six) hours as needed for wheezing or shortness of breath.  Dispense: 150 mL; Refill: 1 - CMP14+EGFR - CBC with Differential/Platelet  3. 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 Gastroesophageal reflux disease, unspecified whether esophagitis present - CMP14+EGFR - CBC with Differential/Platelet  5. Diabetes mellitus type 2 in obese (HCC) - Bayer DCA Hb A1c Waived - CMP14+EGFR - CBC with Differential/Platelet  6. Hyperlipidemia associated with type 2 diabetes mellitus (HCC) - CMP14+EGFR - CBC with Differential/Platelet  7. Major depressive disorder, recurrent episode, moderate (HCC) - CMP14+EGFR - CBC with Differential/Platelet  8. S/P lumbar laminectomy - CMP14+EGFR - CBC with Differential/Platelet  9. Tobacco abuse - CMP14+EGFR -  CBC with Differential/Platelet  10. Morbid obesity ([place]) - CMP14+EGFR - CBC with Differential/Platelet  11. Constipation, unspecified constipation type - CMP14+EGFR - CBC with Differential/Platelet  12. Neck pain - CMP14+EGFR - CBC with Differential/Platelet - DG Cervical Spine Complete  13. Other migraine with status migrainosus, not intractable - CMP14+EGFR - CBC with Differential/Platelet - DG Cervical Spine Complete   Labs pending Health Maintenance reviewed Diet and exercise encouraged  Follow up plan: 3 months    Evelina Dun, FNP

## 2021-06-25 NOTE — Patient Instructions (Signed)
Migraine Headache A migraine headache is an intense, throbbing pain on one side or both sides of the head. Migraine headaches may also cause other symptoms, such as nausea, vomiting, and sensitivity to light and noise. A migraine headache can last from 4 hours to 3 days. Talk with your doctor about what things may bring on (trigger) your migraine headaches. What are the causes? The exact cause of this condition is not known. However, a migraine may be caused when nerves in the brain become irritated and release chemicals that cause inflammation of blood vessels. This inflammation causes pain. This condition may be triggered or caused by: Drinking alcohol. Smoking. Taking medicines, such as: Medicine used to treat chest pain (nitroglycerin). Birth control pills. Estrogen. Certain blood pressure medicines. Eating or drinking products that contain nitrates, glutamate, aspartame, or tyramine. Aged cheeses, chocolate, or caffeine may also be triggers. Doing physical activity. Other things that may trigger a migraine headache include: Menstruation. Pregnancy. Hunger. Stress. Lack of sleep or too much sleep. Weather changes. Fatigue. What increases the risk? The following factors may make you more likely to experience migraine headaches: Being a certain age. This condition is more common in people who are 25-55 years old. Being female. Having a family history of migraine headaches. Being Caucasian. Having a mental health condition, such as depression or anxiety. Being obese. What are the signs or symptoms? The main symptom of this condition is pulsating or throbbing pain. This pain may: Happen in any area of the head, such as on one side or both sides. Interfere with daily activities. Get worse with physical activity. Get worse with exposure to bright lights or loud noises. Other symptoms may include: Nausea. Vomiting. Dizziness. General sensitivity to bright lights, loud noises, or  smells. Before you get a migraine headache, you may get warning signs (an aura). An aura may include: Seeing flashing lights or having blind spots. Seeing bright spots, halos, or zigzag lines. Having tunnel vision or blurred vision. Having numbness or a tingling feeling. Having trouble talking. Having muscle weakness. Some people have symptoms after a migraine headache (postdromal phase), such as: Feeling tired. Difficulty concentrating. How is this diagnosed? A migraine headache can be diagnosed based on: Your symptoms. A physical exam. Tests, such as: CT scan or an MRI of the head. These imaging tests can help rule out other causes of headaches. Taking fluid from the spine (lumbar puncture) and analyzing it (cerebrospinal fluid analysis, or CSF analysis). How is this treated? This condition may be treated with medicines that: Relieve pain. Relieve nausea. Prevent migraine headaches. Treatment for this condition may also include: Acupuncture. Lifestyle changes like avoiding foods that trigger migraine headaches. Biofeedback. Cognitive behavioral therapy. Follow these instructions at home: Medicines Take over-the-counter and prescription medicines only as told by your health care provider. Ask your health care provider if the medicine prescribed to you: Requires you to avoid driving or using heavy machinery. Can cause constipation. You may need to take these actions to prevent or treat constipation: Drink enough fluid to keep your urine pale yellow. Take over-the-counter or prescription medicines. Eat foods that are high in fiber, such as beans, whole grains, and fresh fruits and vegetables. Limit foods that are high in fat and processed sugars, such as fried or sweet foods. Lifestyle Do not drink alcohol. Do not use any products that contain nicotine or tobacco, such as cigarettes, e-cigarettes, and chewing tobacco. If you need help quitting, ask your health care  provider. Get at least 8   hours of sleep every night. Find ways to manage stress, such as meditation, deep breathing, or yoga. General instructions   Keep a journal to find out what may trigger your migraine headaches. For example, write down: What you eat and drink. How much sleep you get. Any change to your diet or medicines. If you have a migraine headache: Avoid things that make your symptoms worse, such as bright lights. It may help to lie down in a dark, quiet room. Do not drive or use heavy machinery. Ask your health care provider what activities are safe for you while you are experiencing symptoms. Keep all follow-up visits as told by your health care provider. This is important. Contact a health care provider if: You develop symptoms that are different or more severe than your usual migraine headache symptoms. You have more than 15 headache days in one month. Get help right away if: Your migraine headache becomes severe. Your migraine headache lasts longer than 72 hours. You have a fever. You have a stiff neck. You have vision loss. Your muscles feel weak or like you cannot control them. You start to lose your balance often. You have trouble walking. You faint. You have a seizure. Summary A migraine headache is an intense, throbbing pain on one side or both sides of the head. Migraines may also cause other symptoms, such as nausea, vomiting, and sensitivity to light and noise. This condition may be treated with medicines and lifestyle changes. You may also need to avoid certain things that trigger a migraine headache. Keep a journal to find out what may trigger your migraine headaches. Contact your health care provider if you have more than 15 headache days in a month or you develop symptoms that are different or more severe than your usual migraine headache symptoms. This information is not intended to replace advice given to you by your health care provider. Make sure you  discuss any questions you have with your health care provider. Document Revised: 12/25/2018 Document Reviewed: 10/15/2018 Elsevier Patient Education  2022 [employer]  

## 2021-06-26 ENCOUNTER — Telehealth: Payer: Self-pay | Admitting: Family

## 2021-06-26 ENCOUNTER — Other Ambulatory Visit: Payer: Self-pay | Admitting: Family

## 2021-06-26 LAB — CBC WITH DIFFERENTIAL/PLATELET
Basophils Absolute: 0.1 10*3/uL (ref 0.0–0.2)
Basos: 0 %
EOS (ABSOLUTE): 0.1 10*3/uL (ref 0.0–0.4)
Eos: 1 %
Hematocrit: 39.3 % (ref 34.0–46.6)
Hemoglobin: 12.8 g/dL (ref 11.1–15.9)
Immature Grans (Abs): 0.1 10*3/uL (ref 0.0–0.1)
Immature Granulocytes: 1 %
Lymphocytes Absolute: 3 10*3/uL (ref 0.7–3.1)
Lymphs: 20 %
MCH: 24.8 pg — ABNORMAL LOW (ref 26.6–33.0)
MCHC: 32.6 g/dL (ref 31.5–35.7)
MCV: 76 fL — ABNORMAL LOW (ref 79–97)
Monocytes Absolute: 0.7 10*3/uL (ref 0.1–0.9)
Monocytes: 5 %
Neutrophils Absolute: 11.1 10*3/uL — ABNORMAL HIGH (ref 1.4–7.0)
Neutrophils: 73 %
Platelets: 286 10*3/uL (ref 150–450)
RBC: 5.17 x10E6/uL (ref 3.77–5.28)
RDW: 15.1 % (ref 11.7–15.4)
WBC: 15 10*3/uL — ABNORMAL HIGH (ref 3.4–10.8)

## 2021-06-26 LAB — CMP14+EGFR
ALT: 20 IU/L (ref 0–32)
AST: 21 IU/L (ref 0–40)
Albumin/Globulin Ratio: 2.1 (ref 1.2–2.2)
Albumin: 5 g/dL — ABNORMAL HIGH (ref 3.8–4.8)
Alkaline Phosphatase: 78 IU/L (ref 44–121)
BUN/Creatinine Ratio: 20 (ref 9–23)
BUN: 27 mg/dL — ABNORMAL HIGH (ref 6–20)
Bilirubin Total: 0.8 mg/dL (ref 0.0–1.2)
CO2: 21 mmol/L (ref 20–29)
Calcium: 10 mg/dL (ref 8.7–10.2)
Chloride: 102 mmol/L (ref 96–106)
Creatinine, Ser: 1.37 mg/dL — ABNORMAL HIGH (ref 0.57–1.00)
Globulin, Total: 2.4 g/dL (ref 1.5–4.5)
Glucose: 96 mg/dL (ref 70–99)
Potassium: 4.6 mmol/L (ref 3.5–5.2)
Sodium: 139 mmol/L (ref 134–144)
Total Protein: 7.4 g/dL (ref 6.0–8.5)
eGFR: 51 mL/min/{1.73_m2} — ABNORMAL LOW (ref >59)

## 2021-06-26 MED ORDER — BACLOFEN 10 MG PO TABS
10.0000 mg | ORAL_TABLET | Freq: Three times a day (TID) | ORAL | 1 refills | Status: DC
Start: 2021-06-26 — End: 2021-06-28

## 2021-06-26 NOTE — Telephone Encounter (Signed)
Pt would like to get an MRI to make sure that she does not have nerve damage. Please call back and advise.

## 2021-06-26 NOTE — Telephone Encounter (Signed)
Please review and advise.

## 2021-06-27 ENCOUNTER — Telehealth: Payer: Self-pay | Admitting: Family

## 2021-06-27 DIAGNOSIS — M542 Cervicalgia: Secondary | ICD-10-CM

## 2021-06-27 NOTE — Telephone Encounter (Signed)
Pt called to let us know that she needs Patricia Stanton to discontinue her taking Baclofen because she is already taking Methocarbamol and says she is trying to get it filled at the pharmacy but is having issues because Patricia Stanton sent in Baclofen.

## 2021-06-27 NOTE — Telephone Encounter (Signed)
Pt called stating that she called yesterday requesting that Alyse Low put in order for her to have an MRI done at [facility] to make sure she doesn't have any nerve damage.   Says Dr Assunta Curtis (pain management doctor) told pt that she needed to have an MRI to make sure she doesn't have any nerve damage in her neck because that could be why she is having lower back pain.   Pt also says she needs Alyse Low to send in a referral for her to see the Kidney specialist in [place] because she says her kidney levels are always elevated when she has lab work done and doesn't want her kidneys shutting down.  Pt has not heard back from anyone regarding these requests. Wants to be called ASAP.

## 2021-06-28 MED ORDER — METHOCARBAMOL 500 MG PO TABS: 500.0000 mg | ORAL_TABLET | Freq: Three times a day (TID) | ORAL | 2 refills | Status: AC | PRN

## 2021-06-28 NOTE — Telephone Encounter (Signed)
Why does she want to see a nephrologists?

## 2021-06-28 NOTE — Telephone Encounter (Signed)
PT and MRI ordered.

## 2021-06-28 NOTE — Telephone Encounter (Signed)
Pt needs to repeat her last BMP. All of kidney functions have been normal until last draw.

## 2021-06-28 NOTE — Telephone Encounter (Signed)
Baclofen d/c and refill sent of Robaxin.

## 2021-06-28 NOTE — Telephone Encounter (Signed)
Also wants referral to kidney doctor. Please advise.

## 2021-06-28 NOTE — Telephone Encounter (Signed)
Patient aware and verbalized understanding. °

## 2021-06-28 NOTE — Telephone Encounter (Signed)
Patient is requesting because her Kidney Functions are staying elevated

## 2021-06-29 ENCOUNTER — Encounter (HOSPITAL_COMMUNITY): Payer: Self-pay | Admitting: *Deleted

## 2021-06-29 ENCOUNTER — Emergency Department (HOSPITAL_COMMUNITY)
Admission: EM | Admit: 2021-06-29 | Discharge: 2021-06-29 | Disposition: A | Discharge status: Home / Self Care | Payer: Medicaid Other | Attending: Emergency Medicine | Admitting: Emergency Medicine

## 2021-06-29 ENCOUNTER — Emergency Department (HOSPITAL_COMMUNITY): Payer: Medicaid Other

## 2021-06-29 ENCOUNTER — Other Ambulatory Visit: Payer: Self-pay

## 2021-06-29 DIAGNOSIS — F17210 Nicotine dependence, cigarettes, uncomplicated: Secondary | ICD-10-CM | POA: Insufficient documentation

## 2021-06-29 DIAGNOSIS — Z7951 Long term (current) use of inhaled steroids: Secondary | ICD-10-CM | POA: Diagnosis not present

## 2021-06-29 DIAGNOSIS — M542 Cervicalgia: Secondary | ICD-10-CM | POA: Diagnosis not present

## 2021-06-29 DIAGNOSIS — E1122 Type 2 diabetes mellitus with diabetic chronic kidney disease: Secondary | ICD-10-CM | POA: Insufficient documentation

## 2021-06-29 DIAGNOSIS — Z7984 Long term (current) use of oral hypoglycemic drugs: Secondary | ICD-10-CM | POA: Insufficient documentation

## 2021-06-29 DIAGNOSIS — Z794 Long term (current) use of insulin: Secondary | ICD-10-CM | POA: Diagnosis not present

## 2021-06-29 DIAGNOSIS — I129 Hypertensive chronic kidney disease with stage 1 through stage 4 chronic kidney disease, or unspecified chronic kidney disease: Secondary | ICD-10-CM | POA: Insufficient documentation

## 2021-06-29 DIAGNOSIS — N189 Chronic kidney disease, unspecified: Secondary | ICD-10-CM | POA: Diagnosis not present

## 2021-06-29 DIAGNOSIS — R202 Paresthesia of skin: Secondary | ICD-10-CM | POA: Diagnosis not present

## 2021-06-29 DIAGNOSIS — Z79899 Other long term (current) drug therapy: Secondary | ICD-10-CM | POA: Insufficient documentation

## 2021-06-29 NOTE — ED Notes (Signed)
Pt asking repeatedly when she will be discharged. Pt states "the doctor said I could leave after my CT". I did show pt list on wall with estimated wait times with CT of 3-4 hours. Pt became belligerent and said "that doctor needs to hurry his ass up". I provided verbal support and apologized. Pt abruptly left and said "you are a dumb bitch and you need to learn your job". Pt left, doc notified

## 2021-06-29 NOTE — ED Provider Notes (Signed)
[facility] EMERGENCY DEPARTMENT Provider Note   CSN: [id number] Arrival date & time: 06/29/21  1615     History No chief complaint on file.   Patricia [place] is a 35 y.o. female.  Patient presents chief complaint of tingling sensation in bilateral hands and bilateral paraspinal neck pain ongoing for the last 2 to 3 days.  She saw her primary care doctor who ordered an x-ray that showed reversal of lordosis, which caused the patient to be concerned and was sent to the ER for further imaging.  Otherwise denies any fall or trauma.  Denies fevers or cough or vomiting or diarrhea.  She has a history of lower back pain is on gabapentin and pain medications at baseline at home.      Past Medical History:  Diagnosis Date   Adrenal tumor    Anxiety    Arthritis    left knee   Asthma    Chronic abdominal pain    Chronic headaches    Depression    Diabetes mellitus type 2 in obese ([place]) 11/20/2016   Endometriosis    Gastroesophageal reflux disease 11/09/2020   HLD (hyperlipidemia) 02/17/2017   Hypertension    Hypertension 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 ([place]) 03/25/2019   Mood disorder ([place])    Obesity    Ovarian cyst    Tobacco abuse     Patient Active Problem List   Diagnosis Date Noted   Elevated ALT measurement 01/04/2021   Gastroesophageal reflux disease 11/09/2020   S/P lumbar laminectomy 05/21/2019   Simple chronic bronchitis ([place]) 03/25/2019   Hypertension 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 ([place]) 03/25/2019   Chronic abdominal pain 03/25/2019   Leukocytosis 11/19/2018   Noncompliance 06/03/2017   Hyperlipidemia associated with type 2 diabetes mellitus ([place]) 02/17/2017   Major depressive disorder, recurrent episode, moderate ([place]) 02/03/2017   Vitamin D deficiency 11/20/2016   Diabetes mellitus type 2 in obese ([place]) 11/20/2016   PTSD (post-traumatic stress disorder) 11/19/2016   History of  hernia surgery 11/19/2016   Pulmonary nodule/lesion, solitary 11/19/2016   Mucosal abnormality of stomach    Non-intractable vomiting 12/19/2015   Constipation 12/19/2015   Tobacco abuse 06/06/2013   Morbid obesity ([place]) 06/06/2013    Past Surgical History:  Procedure Laterality Date   CESAREAN SECTION  2008   [place]   CHOLECYSTECTOMY N/A 11/25/2014   Procedure: LAPAROSCOPIC CHOLECYSTECTOMY;  Surgeon: Aviva Signs Md, MD;  Location: [facility];  Service: General;  Laterality: N/A;   ESOPHAGOGASTRODUODENOSCOPY N/A 12/21/2015   Dr. Gala Romney: normal esophagus, non-bleeding erosive gastropathy, normal second portion of duodenum. Reactive gastropathy/chemical gastritis, negative H.pylori   ESOPHAGOGASTRODUODENOSCOPY (EGD) WITH PROPOFOL N/A 11/23/2020    Surgeon: Daneil Dolin, MD; normal esophagus, small hiatal hernia, normal examined duodenum.   FEMUR FRACTURE SURGERY Left 1995   tree fell on her   [place] N/A 01/29/2016   Procedure: Fatima Blank HERNIORRHAPHY WITH MESH;  Surgeon: Aviva Signs, MD;  Location: [facility];  Service: General;  Laterality: N/A;   INSERTION OF MESH  01/29/2016   Procedure: INSERTION OF MESH;  Surgeon: Aviva Signs, MD;  Location: [facility];  Service: General;;   KNEE ARTHROSCOPY Left    LUMBAR LAMINECTOMY/DECOMPRESSION MICRODISCECTOMY Right 05/21/2019   Procedure: Microdiscectomy - Lumbar four-Lumbar five - right;  Surgeon: Eustace Moore, MD;  Location: [place];  Service: Neurosurgery;  Laterality: Right;   TUBAL  LIGATION       OB History   No obstetric history on file.     Family History  Problem Relation Age of Onset   Hypertension Mother    Hyperlipidemia Mother    Fibromyalgia Mother    Other Mother        degenerative disc disease   Arthritis Mother    Kidney disease Mother    Diabetes Father    Hypertension Father    Hyperlipidemia Father    Heart disease Father 26   Heart attack Father    Stroke Father    Hyperlipidemia  Brother    Hypertension Brother    Aneurysm Maternal Grandmother        AAA   Kidney disease Maternal Grandmother    Kidney disease Maternal Grandfather    Aneurysm Paternal Grandmother        AAA   Kidney disease Paternal Grandmother    Kidney disease Paternal Grandfather    Colon cancer Neg Hx    Inflammatory bowel disease Neg Hx     Social History   Tobacco Use   Smoking status: Every Day    Packs/day: 0.50    Years: 11.00    Pack years: 5.50    Types: Cigarettes    Start date: 09/17/2003   Smokeless tobacco: Never  Vaping Use   Vaping Use: Never used  Substance Use Topics   Alcohol use: No   Drug use: No    Home Medications Prior to Admission medications   Medication Sig Start Date End Date Taking? Authorizing Provider  albuterol (PROVENTIL) (2.5 MG/3ML) 0.083% nebulizer solution Take 3 mLs (2.5 mg total) by nebulization every 6 (six) hours as needed for wheezing or shortness of breath. 06/25/21   Sharion Balloon, FNP  albuterol (VENTOLIN HFA) 108 (90 Base) MCG/ACT inhaler Inhale 2 puffs into the lungs every 6 (six) hours as needed for wheezing or shortness of breath. 06/25/21   Sharion Balloon, FNP  budesonide-formoterol (SYMBICORT) 80-4.5 MCG/ACT inhaler Inhale 2 puffs into the lungs 2 (two) times daily. 06/25/21   Sharion Balloon, FNP  cetirizine (ZYRTEC) 10 MG tablet Take 1 tablet (10 mg total) by mouth daily. 06/08/20   Evelina Dun A, FNP  escitalopram (LEXAPRO) 20 MG tablet Take 1 tablet (20 mg total) by mouth daily. 06/20/21 09/18/21  Norman Clay, MD  fluconazole (DIFLUCAN) 150 MG tablet Take 1 tablet (150 mg total) by mouth every three (3) days as needed. 06/22/21   Evelina Dun A, FNP  gabapentin (NEURONTIN) 600 MG tablet Take 600 mg by mouth 3 (three) times daily. 11/05/20   [provider]  glipiZIDE (GLUCOTROL XL) 5 MG 24 hr tablet Take 1 tablet (5 mg total) by mouth daily with breakfast. 06/22/21   Evelina Dun A, FNP  insulin glargine (LANTUS  SOLOSTAR) 100 UNIT/ML Solostar Pen Inject 10 Units into the skin daily. 03/16/21   Sharion Balloon, FNP  linaclotide [facility]) 145 MCG CAPS capsule Take 1 capsule (145 mcg total) by mouth daily before breakfast. 04/13/21   Erenest Rasher, PA-C  linagliptin (TRADJENTA) 5 MG TABS tablet Take 1 tablet (5 mg total) by mouth daily. 06/22/21   Evelina Dun A, FNP  lisinopril-hydrochlorothiazide (ZESTORETIC) 20-25 MG tablet Take 1 tablet by mouth daily. 11/29/20   Ivy Lynn, NP  lovastatin (MEVACOR) 20 MG tablet TAKE 1 TABLET(20 MG) BY MOUTH AT BEDTIME 06/04/21   Evelina Dun A, FNP  metFORMIN (GLUCOPHAGE XR) 500 MG 24 hr  tablet Take 2 tablets (1,000 mg total) by mouth 2 (two) times daily. 06/08/20   Sharion Balloon, FNP  methocarbamol (ROBAXIN) 500 MG tablet Take 1 tablet (500 mg total) by mouth 3 (three) times daily as needed for muscle spasms. 06/28/21   Evelina Dun A, FNP  oxyCODONE-acetaminophen (PERCOCET/ROXICET) 5-325 MG tablet Take 1 tablet by mouth every 4 (four) hours as needed for moderate pain. 05/21/19   Eustace Moore, MD  pantoprazole (PROTONIX) 40 MG tablet Take 1 tablet (40 mg total) by mouth 2 (two) times daily. 11/09/20   Erenest Rasher, PA-C  prazosin (MINIPRESS) 1 MG capsule Take 3 capsules (3 mg total) by mouth at bedtime. 06/20/21 09/18/21  Norman Clay, MD    Allergies    Tramadol  Review of Systems   Review of Systems  Constitutional:  Negative for fever.  HENT:  Negative for ear pain.   Eyes:  Negative for pain.  Respiratory:  Negative for cough.   Cardiovascular:  Negative for chest pain.  Gastrointestinal:  Negative for abdominal pain.  Genitourinary:  Negative for flank pain.  Musculoskeletal:  Negative for back pain.  Skin:  Negative for rash.  Neurological:  Negative for headaches.   Physical Exam Updated Vital Signs BP (!) 148/86   Pulse 78   Temp 98 F (36.7 C) (Oral)   Resp 16   Ht 5\' 5"  (1.651 m)   Wt 128 kg   SpO2 98%   BMI 46.96 kg/m    Physical Exam Constitutional:      General: She is not in acute distress.    Appearance: Normal appearance.  HENT:     Head: Normocephalic.     Nose: Nose normal.  Eyes:     Extraocular Movements: Extraocular movements intact.  Cardiovascular:     Rate and Rhythm: Normal rate.  Pulmonary:     Effort: Pulmonary effort is normal.  Musculoskeletal:        General: Normal range of motion.     Cervical back: Normal range of motion.  Neurological:     General: No focal deficit present.     Mental Status: She is alert. Mental status is at baseline.     Comments: 5/5 strength all extremities.  No focal neurodeficit noted.  Gait is normal without assistance.  Mild paraspinal C4-5 tenderness.  No midline tenderness noted.  Normal range of motion of the C-spine noted.    ED Results / Procedures / Treatments   Labs (all labs ordered are listed, but only abnormal results are displayed) Labs Reviewed - No data to display  EKG None  Radiology CT Cervical Spine Wo Contrast  Result Date: 06/29/2021 CLINICAL DATA:  Neck trauma, midline tenderness (Age 19-64y) neck pain; paresthesia EXAM: CT CERVICAL SPINE WITHOUT CONTRAST TECHNIQUE: Multidetector CT imaging of the cervical spine was performed without intravenous contrast. Multiplanar CT image reconstructions were also generated. COMPARISON:  None. FINDINGS: Alignment: No significant listhesis. Skull base and vertebrae: Vertebral body heights are maintained. No acute fracture. No destructive osseous lesion. Soft tissues and spinal canal: No prevertebral fluid or swelling. No visible canal hematoma. Disc levels: Intervertebral disc heights are preserved. There is no significant osseous encroachment on the spinal canal or neural foramina. Upper chest: Minimally imaged lung apices are clear. Other: None. IMPRESSION: No acute or significant abnormality. Electronically Signed   By: Macy Mis M.D.   On: 06/29/2021 19:30     Procedures Procedures   Medications Ordered in ED Medications - No  data to display  ED Course  I have reviewed the triage vital signs and the nursing notes.  Pertinent labs & imaging results that were available during my care of the patient were reviewed by me and considered in my medical decision making (see chart for details).    MDM Rules/Calculators/A&P                           I explained at length to the patient that the radiologist reading a reversal of cervical lordosis can be normal finding and does not indicate any specific illness.  Patient appears very nervous and requested additional imaging.  Imaging unremarkable.  Patient discharged home in stable condition advised outpatient follow-up with her neurologist/neurosurgeon within the week.  Advised return for worsening symptoms fevers or any additional concerns.  Final Clinical Impression(s) / ED Diagnoses Final diagnoses:  Neck pain    Rx / DC Orders ED Discharge Orders     None        Luna Fuse, MD 06/29/21 1943

## 2021-06-29 NOTE — ED Triage Notes (Signed)
States urgent care advised her to come in for a ct of neck

## 2021-07-02 ENCOUNTER — Telehealth: Payer: Self-pay

## 2021-07-02 NOTE — Telephone Encounter (Signed)
Transition Care Management Unsuccessful Follow-up Telephone Call  Date of discharge and from where:  06/29/2021-[place]  Attempts:  1st Attempt  Reason for unsuccessful TCM follow-up call:  Left voice message

## 2021-07-03 NOTE — Telephone Encounter (Signed)
Transition Care Management Unsuccessful Follow-up Telephone Call  Date of discharge and from where:  06/29/2021-[place]  Attempts:  2nd Attempt  Reason for unsuccessful TCM follow-up call:  Left voice message

## 2021-07-05 NOTE — Telephone Encounter (Signed)
Transition Care Management Unsuccessful Follow-up Telephone Call  Date of discharge and from where:  06/29/2021 from [place]  Attempts:  3rd Attempt  Reason for unsuccessful TCM follow-up call:  Unable to reach patient

## 2021-07-13 ENCOUNTER — Ambulatory Visit (INDEPENDENT_AMBULATORY_CARE_PROVIDER_SITE_OTHER): Payer: Medicaid Other

## 2021-07-13 ENCOUNTER — Other Ambulatory Visit: Payer: Self-pay

## 2021-07-13 ENCOUNTER — Ambulatory Visit
Admission: RE | Admit: 2021-07-13 | Discharge: 2021-07-13 | Disposition: A | Discharge status: Home / Self Care | Payer: Medicaid Other | Source: Ambulatory Visit | Attending: Urgent Care | Admitting: Urgent Care

## 2021-07-13 VITALS — BP 163/87 | HR 114 | Temp 99.1°F | Resp 23

## 2021-07-13 DIAGNOSIS — E119 Type 2 diabetes mellitus without complications: Secondary | ICD-10-CM

## 2021-07-13 DIAGNOSIS — R0602 Shortness of breath: Secondary | ICD-10-CM | POA: Diagnosis not present

## 2021-07-13 DIAGNOSIS — R051 Acute cough: Secondary | ICD-10-CM

## 2021-07-13 DIAGNOSIS — R059 Cough, unspecified: Secondary | ICD-10-CM | POA: Diagnosis not present

## 2021-07-13 DIAGNOSIS — R0902 Hypoxemia: Secondary | ICD-10-CM | POA: Diagnosis not present

## 2021-07-13 DIAGNOSIS — J4540 Moderate persistent asthma, uncomplicated: Secondary | ICD-10-CM

## 2021-07-13 LAB — POCT FASTING CBG KUC MANUAL ENTRY: POCT Glucose (KUC): 414 mg/dL — AB (ref 70–99)

## 2021-07-13 MED ORDER — PREDNISONE 10 MG PO TABS: 10.0000 mg | ORAL_TABLET | Freq: Every day | ORAL | 0 refills | Status: DC

## 2021-07-13 MED ORDER — PROMETHAZINE-DM 6.25-15 MG/5ML PO SYRP: 5.0000 mL | ORAL_SOLUTION | Freq: Every evening | ORAL | 0 refills | Status: DC | PRN

## 2021-07-13 NOTE — ED Provider Notes (Signed)
[place]   MRN: [id number] DOB: 11/08/1985  Subjective:   Patricia Stanton is a 35 y.o. female presenting for 3 day history of acute onset productive cough, chest congestion, shob. Has been using her breathing treatments at home with very temporary relief. Has a history of asthma, is also a smoker. Took a COVID test at home and was negative. Has DM 2 treated with insulin.  Did an at-home COVID test and was negative.  Does not want a repeat.  No body aches, headaches, runny or stuffy nose.  No current facility-administered medications for this encounter.  Current Outpatient Medications:    albuterol (PROVENTIL) (2.5 MG/3ML) 0.083% nebulizer solution, Take 3 mLs (2.5 mg total) by nebulization every 6 (six) hours as needed for wheezing or shortness of breath., Disp: 150 mL, Rfl: 1   albuterol (VENTOLIN HFA) 108 (90 Base) MCG/ACT inhaler, Inhale 2 puffs into the lungs every 6 (six) hours as needed for wheezing or shortness of breath., Disp: 18 g, Rfl: 6   budesonide-formoterol (SYMBICORT) 80-4.5 MCG/ACT inhaler, Inhale 2 puffs into the lungs 2 (two) times daily., Disp: 1 each, Rfl: 3   escitalopram (LEXAPRO) 20 MG tablet, Take 1 tablet (20 mg total) by mouth daily., Disp: 90 tablet, Rfl: 0   glipiZIDE (GLUCOTROL XL) 5 MG 24 hr tablet, Take 1 tablet (5 mg total) by mouth daily with breakfast., Disp: 30 tablet, Rfl: 3   linagliptin (TRADJENTA) 5 MG TABS tablet, Take 1 tablet (5 mg total) by mouth daily., Disp: 30 tablet, Rfl: 3   lisinopril-hydrochlorothiazide (ZESTORETIC) 20-25 MG tablet, Take 1 tablet by mouth daily., Disp: 90 tablet, Rfl: 1   lovastatin (MEVACOR) 20 MG tablet, TAKE 1 TABLET(20 MG) BY MOUTH AT BEDTIME, Disp: 90 tablet, Rfl: 0   RYBELSUS 7 MG TABS, Take 1 tablet by mouth daily., Disp: , Rfl:    cetirizine (ZYRTEC) 10 MG tablet, Take 1 tablet (10 mg total) by mouth daily., Disp: 90 tablet, Rfl: 1   fluconazole (DIFLUCAN) 150 MG tablet, Take 1 tablet (150 mg  total) by mouth every three (3) days as needed., Disp: 3 tablet, Rfl: 0   gabapentin (NEURONTIN) 600 MG tablet, Take 600 mg by mouth 3 (three) times daily., Disp: , Rfl:    insulin glargine (LANTUS SOLOSTAR) 100 UNIT/ML Solostar Pen, Inject 10 Units into the skin daily., Disp: 15 mL, Rfl: PRN   linaclotide (LINZESS) 145 MCG CAPS capsule, Take 1 capsule (145 mcg total) by mouth daily before breakfast., Disp: 30 capsule, Rfl: 5   metFORMIN (GLUCOPHAGE XR) 500 MG 24 hr tablet, Take 2 tablets (1,000 mg total) by mouth 2 (two) times daily., Disp: 360 tablet, Rfl: 1   methocarbamol (ROBAXIN) 500 MG tablet, Take 1 tablet (500 mg total) by mouth 3 (three) times daily as needed for muscle spasms., Disp: 90 tablet, Rfl: 2   oxyCODONE-acetaminophen (PERCOCET/ROXICET) 5-325 MG tablet, Take 1 tablet by mouth every 4 (four) hours as needed for moderate pain., Disp: 30 tablet, Rfl: 0   pantoprazole (PROTONIX) 40 MG tablet, Take 1 tablet (40 mg total) by mouth 2 (two) times daily., Disp: 60 tablet, Rfl: 3   prazosin (MINIPRESS) 1 MG capsule, Take 3 capsules (3 mg total) by mouth at bedtime., Disp: 270 capsule, Rfl: 0   Allergies  Allergen Reactions   Tramadol Other (See Comments)    Exacerbates her asthma     Past Medical History:  Diagnosis Date   Adrenal tumor    Anxiety  Arthritis    left knee   Asthma    Chronic abdominal pain    Chronic headaches    Depression    Diabetes mellitus type 2 in obese ([place]) 11/20/2016   Endometriosis    Gastroesophageal reflux disease 11/09/2020   HLD (hyperlipidemia) 02/17/2017   Hypertension    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 Gala Romney: normal esophagus, non-bleeding erosive gastropathy, normal second portion of duodenum. Reactive gastropathy/chemical gastritis, negative H.pylori   ESOPHAGOGASTRODUODENOSCOPY (EGD) WITH PROPOFOL N/A 11/23/2020    Surgeon: Daneil Dolin, MD; normal esophagus, small hiatal hernia, normal examined duodenum.   FEMUR FRACTURE SURGERY Left 1995   tree fell on her   [place] N/A 01/29/2016   Procedure: Fatima Blank HERNIORRHAPHY WITH MESH;  Surgeon: Aviva Signs, MD;  Location: [facility];  Service: General;  Laterality: N/A;   INSERTION OF MESH  01/29/2016   Procedure: INSERTION OF MESH;  Surgeon: Aviva Signs, MD;  Location: [facility];  Service: General;;   KNEE ARTHROSCOPY Left    LUMBAR LAMINECTOMY/DECOMPRESSION MICRODISCECTOMY Right 05/21/2019   Procedure: Microdiscectomy - Lumbar four-Lumbar five - right;  Surgeon: Eustace Moore, MD;  Location: [place];  Service: Neurosurgery;  Laterality: Right;   TUBAL LIGATION      Family History  Problem Relation Age of Onset   Hypertension Mother    Hyperlipidemia Mother    Fibromyalgia Mother    Other Mother        degenerative disc disease   Arthritis Mother    Kidney disease Mother    Diabetes Father    Hypertension Father    Hyperlipidemia Father    Heart disease Father 14   Heart attack Father    Stroke Father    Hyperlipidemia Brother    Hypertension Brother    Aneurysm Maternal Grandmother        AAA   Kidney disease Maternal Grandmother    Kidney disease Maternal Grandfather    Aneurysm Paternal Grandmother        AAA   Kidney disease Paternal Grandmother    Kidney disease Paternal Grandfather    Colon cancer Neg Hx    Inflammatory bowel disease Neg Hx     Social History   Tobacco Use   Smoking status: Every Day    Packs/day: 0.50    Years: 11.00    Pack years: 5.50    Types: Cigarettes    Start  date: 09/17/2003   Smokeless tobacco: Never  Vaping Use   Vaping Use: Never used  Substance Use Topics   Alcohol use: No   Drug use: No    ROS   Objective:   Vitals: BP (!) 163/87   Pulse (!) 114   Temp 99.1 F (37.3 C) (Oral)   Resp (!) 23   LMP 07/13/2021 (Exact Date)   SpO2 92%   Pulse oximetry recheck was 95-98%.   Physical Exam Constitutional:      General: She is not in acute  distress.    Appearance: Normal appearance. She is well-developed. She is obese. She is not ill-appearing, toxic-appearing or diaphoretic.  HENT:     Head: Normocephalic and atraumatic.     Right Ear: External ear normal.     Left Ear: External ear normal.     Nose: Nose normal.     Mouth/Throat:     Mouth: Mucous membranes are moist.  Eyes:     General: No scleral icterus.       Right eye: No discharge.        Left eye: No discharge.     Extraocular Movements: Extraocular movements intact.     Conjunctiva/sclera: Conjunctivae normal.     Pupils: Pupils are equal, round, and reactive to light.  Cardiovascular:     Rate and Rhythm: Normal rate and regular rhythm.     Pulses: Normal pulses.     Heart sounds: Normal heart sounds. No murmur heard.   No friction rub. No gallop.  Pulmonary:     Effort: Pulmonary effort is normal. No respiratory distress.     Breath sounds: No stridor. Wheezing (mild over mid-upper lung fields) present. No rhonchi or rales.     Comments: Decreased lung sounds bilaterally, possibly due to body habitus.  Skin:    General: Skin is warm and dry.     Findings: No rash.  Neurological:     Mental Status: She is alert and oriented to person, place, and time.  Psychiatric:        Mood and Affect: Mood normal.        Behavior: Behavior normal.        Thought Content: Thought content normal.        Judgment: Judgment normal.    DG Chest 2 View  Result Date: 07/13/2021 CLINICAL DATA:  Cough, shortness of breath.  Hypoxia EXAM: CHEST - 2 VIEW COMPARISON:  Chest  x-ray 05/21/2019, CT chest 01/28/2017 FINDINGS: The heart and mediastinal contours are unchanged. Low lung volumes. No focal consolidation. No pulmonary edema. No pleural effusion. No pneumothorax. No acute osseous abnormality. IMPRESSION: No active cardiopulmonary disease. Electronically Signed   By: Iven Finn M.D.   On: 07/13/2021 18:44      Results for orders placed or performed during the hospital encounter of 07/13/21 (from the past 24 hour(s))  POCT CBG (manual entry)     Status: Abnormal   Collection Time: 07/13/21  6:26 PM  Result Value Ref Range   POCT Glucose ([facility]) 414 (A) 70 - 99 mg/dL    Assessment and Plan :   PDMP not reviewed this encounter.  1. Acute cough   2. Shortness of breath   3. Hypoxia   4. Moderate persistent asthma without complication   5. Type 2 diabetes mellitus treated with insulin [facility])    Patient refused testing for respiratory illnesses like COVID and flu.  Chest x-ray is negative.  Patient does have some wheezing in her pulse oximetry is fluctuating between the low 90 to the higher 90s.  She would benefit from a steroid course and discussed it with their, will use a low-dose 10 mg.  Emphasized strict diabetic diet.  Reviewed foods with her.  Follow-up with PCP as soon as possible. Counseled patient on potential for adverse effects with medications prescribed/recommended today, ER and return-to-clinic precautions discussed, patient verbalized understanding.    Jaynee Eagles, [place] 07/13/21 1924

## 2021-07-13 NOTE — ED Triage Notes (Signed)
Patient c/o productive cough w/ "yellow" sputum x 2-3 days.   Patient endorses chest congestion. Patient endorses SOB.   Patient took an at home COVID test with negative results.   History of Asthma.   Patient has used Albuterol, nebulizer, and Symbicort inhaler with some relief of symptoms.

## 2021-07-13 NOTE — Discharge Instructions (Addendum)
For diabetes or elevated blood sugar, please make sure you are limiting and avoiding starchy, carbohydrate foods like pasta, breads, sweet breads, pastry, rice, potatoes, desserts. These foods can elevate your blood sugar. Also, limit and avoid drinks that contain a lot of sugar such as sodas, sweet teas, fruit juices.  Drinking plain water will be much more helpful, try 64 ounces of water daily.  It is okay to flavor your water naturally by cutting cucumber, lemon, mint or lime, placing it in a picture with water and drinking it over a period of 24-48 hours as long as it remains refrigerated. ? ?For elevated blood pressure, make sure you are monitoring salt in your diet.  Do not eat restaurant foods and limit processed foods at home. I highly recommend you prepare and cook your own foods at home.  Processed foods include things like frozen meals, pre-seasoned meats and dinners, deli meats, canned foods as these foods contain a high amount of sodium/salt.  Make sure you are paying attention to sodium labels on foods you buy at the grocery store. Buy your spices separately such as garlic powder, onion powder, cumin, cayenne, parsley flakes so that you can avoid seasonings that contain salt. However, salt-free seasonings are available and can be used, an example is Mrs. Dash and includes a lot of different mixtures that do not contain salt. ? ?Lastly, when cooking using oils that are healthier for you is important. This includes olive oil, avocado oil, canola oil. We have discussed a lot of foods to avoid but below is a list of foods that can be very healthy to use in your diet whether it is for diabetes, cholesterol, high blood pressure, or in general healthy eating. ? ?Salads - kale, spinach, cabbage, spring mix, arugula ?Fruits - avocadoes, berries (blueberries, raspberries, blackberries), apples, oranges, pomegranate, grapefruit, kiwi ?Vegetables - asparagus, cauliflower, broccoli, green beans, brussel sprouts,  bell peppers, beets; stay away from or limit starchy vegetables like potatoes, carrots, peas ?Other general foods - kidney beans, egg whites, almonds, walnuts, sunflower seeds, pumpkin seeds, fat free yogurt, almond milk, flax seeds, quinoa, oats  ?Meat - It is better to eat lean meats and limit your red meat including pork to once a week.  Wild caught fish, chicken breast are good options as they tend to be leaner sources of good protein. Still be mindful of the sodium labels for the meats you buy. ? ?DO NOT EAT ANY FOODS ON THIS LIST THAT YOU ARE ALLERGIC TO. For more specific needs, I highly recommend consulting a dietician or nutritionist but this can definitely be a good starting point. ? ?

## 2021-07-17 ENCOUNTER — Encounter: Payer: Self-pay | Admitting: Nurse Practitioner

## 2021-07-17 ENCOUNTER — Ambulatory Visit (HOSPITAL_COMMUNITY): Payer: Medicaid Other | Admitting: Physical Therapy

## 2021-07-17 ENCOUNTER — Telehealth: Payer: Self-pay | Admitting: Nurse Practitioner

## 2021-07-17 ENCOUNTER — Telehealth: Payer: Medicaid Other | Admitting: Nurse Practitioner

## 2021-07-17 DIAGNOSIS — J410 Simple chronic bronchitis: Secondary | ICD-10-CM | POA: Diagnosis not present

## 2021-07-17 DIAGNOSIS — J069 Acute upper respiratory infection, unspecified: Secondary | ICD-10-CM | POA: Diagnosis not present

## 2021-07-17 DIAGNOSIS — J209 Acute bronchitis, unspecified: Secondary | ICD-10-CM

## 2021-07-17 MED ORDER — BENZONATATE 100 MG PO CAPS: 100.0000 mg | ORAL_CAPSULE | Freq: Three times a day (TID) | ORAL | 0 refills | Status: DC | PRN

## 2021-07-17 MED ORDER — DM-GUAIFENESIN ER 30-600 MG PO TB12: 1.0000 | ORAL_TABLET | Freq: Two times a day (BID) | ORAL | 0 refills | Status: DC

## 2021-07-17 MED ORDER — BUDESONIDE-FORMOTEROL FUMARATE 80-4.5 MCG/ACT IN AERO: 2.0000 | INHALATION_SPRAY | Freq: Two times a day (BID) | RESPIRATORY_TRACT | 3 refills | Status: DC

## 2021-07-17 MED ORDER — ALBUTEROL SULFATE HFA 108 (90 BASE) MCG/ACT IN AERS: 2.0000 | INHALATION_SPRAY | Freq: Four times a day (QID) | RESPIRATORY_TRACT | 6 refills | Status: DC | PRN

## 2021-07-17 MED ORDER — GUAIFENESIN ER 1200 MG PO TB12: 1200.0000 mg | ORAL_TABLET | Freq: Two times a day (BID) | ORAL | 0 refills | Status: DC

## 2021-07-17 NOTE — Patient Instructions (Signed)
Upper Respiratory Infection, Adult  An upper respiratory infection (URI) affects the nose, throat, and upper air passages. URIs are caused by germs (viruses). The most common type of URI is often called "the common cold."  Medicines cannot cure URIs, but you can do things at home to relieve your symptoms. URIs usually get better within 7-10 days.  Follow these instructions at home:  Activity  Rest as needed.  If you have a fever, stay home from work or school until your fever is gone, or until your doctor says you may return to work or school.  You should stay home until you cannot spread the infection anymore (you are not contagious).  Your doctor may have you wear a face mask so you have less risk of spreading the infection.  Relieving symptoms  Gargle with a salt-water mixture 3-4 times a day or as needed. To make a salt-water mixture, completely dissolve -1 tsp of salt in 1 cup of warm water.  Use a cool-mist humidifier to add moisture to the air. This can help you breathe more easily.  Eating and drinking    Drink enough fluid to keep your pee (urine) pale yellow.  Eat soups and other clear broths.  General instructions    Take over-the-counter and prescription medicines only as told by your doctor. These include cold medicines, fever reducers, and cough suppressants.  Do not use any products that contain nicotine or tobacco. These include cigarettes and e-cigarettes. If you need help quitting, ask your doctor.  Avoid being where people are smoking (avoid secondhand smoke).  Make sure you get regular shots and get the flu shot every year.  Keep all follow-up visits as told by your doctor. This is important.  How to avoid spreading infection to others    Wash your hands often with soap and water. If you do not have soap and water, use hand sanitizer.  Avoid touching your mouth, face, eyes, or nose.  Cough or sneeze into a tissue or your sleeve or elbow. Do not cough or sneeze into your hand or into the  air.  Contact a doctor if:  You are getting worse, not better.  You have any of these:  A fever.  Chills.  Brown or red mucus in your nose.  Yellow or brown fluid (discharge)coming from your nose.  Pain in your face, especially when you bend forward.  Swollen neck glands.  Pain with swallowing.  White areas in the back of your throat.  Get help right away if:  You have shortness of breath that gets worse.  You have very bad or constant:  Headache.  Ear pain.  Pain in your forehead, behind your eyes, and over your cheekbones (sinus pain).  Chest pain.  You have long-lasting (chronic) lung disease along with any of these:  Wheezing.  Long-lasting cough.  Coughing up blood.  A change in your usual mucus.  You have a stiff neck.  You have changes in your:  Vision.  Hearing.  Thinking.  Mood.  Summary  An upper respiratory infection (URI) is caused by a germ called a virus. The most common type of URI is often called "the common cold."  URIs usually get better within 7-10 days.  Take over-the-counter and prescription medicines only as told by your doctor.  This information is not intended to replace advice given to you by your health care provider. Make sure you discuss any questions you have with your health care provider.  Document 

## 2021-07-17 NOTE — Assessment & Plan Note (Signed)
Take meds as prescribed - Use a cool mist humidifier  -Use saline nose sprays frequently -Force fluids -For fever or aches or pains- take Tylenol or ibuprofen. -If symptoms do not improve, she may need to be COVID tested to rule this out -Benzonatate 100 mg tablet by mouth for cough -Guaifenesin for cough and congestion. Follow up with worsening unresolved symptoms

## 2021-07-17 NOTE — Telephone Encounter (Signed)
OV with Je today

## 2021-07-17 NOTE — Progress Notes (Signed)
   Virtual Visit  Note Due to COVID-19 pandemic this visit was conducted virtually. This visit type was conducted due to national recommendations for restrictions regarding the COVID-19 Pandemic (e.g. social distancing, sheltering in place) in an effort to limit this patient's exposure and mitigate transmission in our community. All issues noted in this document were discussed and addressed.  A physical exam was not performed with this format.  I connected with Patricia Stanton on 07/17/21 at 8:40 AM by telephone and verified that I am speaking with the correct person using two identifiers. Patricia Stanton is currently located at home during visit. The provider, Ivy Lynn, NP is located in their office at time of visit.  I discussed the limitations, risks, security and privacy concerns of performing an evaluation and management service by telephone and the availability of in person appointments. I also discussed with the patient that there may be a patient responsible charge related to this service. The patient expressed understanding and agreed to proceed.   History and Present Illness:  URI  This is a recurrent problem. Episode onset: In the past 3 days. The problem has been unchanged. There has been no fever. Associated symptoms include congestion, coughing and sinus pain. Pertinent negatives include no abdominal pain, chest pain, headaches, nausea, rash, sneezing or sore throat.     Review of Systems  Constitutional: Negative.   HENT:  Positive for congestion and sinus pain. Negative for sneezing and sore throat.   Respiratory:  Positive for cough.   Cardiovascular:  Negative for chest pain.  Gastrointestinal:  Negative for abdominal pain and nausea.  Skin:  Negative for rash.  Neurological:  Negative for headaches.  All other systems reviewed and are negative.   Observations/Objective: Televisit patient not in distress  Assessment and Plan: Take meds as prescribed - Use a  cool mist humidifier  -Use saline nose sprays frequently -Force fluids -For fever or aches or pains- take Tylenol or ibuprofen. -If symptoms do not improve, she may need to be COVID tested to rule this out -Benzonatate 100 mg tablet by mouth for cough -Guaifenesin for cough and congestion. Follow up with worsening unresolved symptoms   Follow Up Instructions: Follow-up with unresolved symptoms.    I discussed the assessment and treatment plan with the patient. The patient was provided an opportunity to ask questions and all were answered. The patient agreed with the plan and demonstrated an understanding of the instructions.   The patient was advised to call back or seek an in-person evaluation if the symptoms worsen or if the condition fails to improve as anticipated.  The above assessment and management plan was discussed with the patient. The patient verbalized understanding of and has agreed to the management plan. Patient is aware to call the clinic if symptoms persist or worsen. Patient is aware when to return to the clinic for a follow-up visit. Patient educated on when it is appropriate to go to the emergency department.   Time call ended: 8:50 AM  I provided 10 minutes of  non face-to-face time during this encounter.    Ivy Lynn, NP

## 2021-07-17 NOTE — Addendum Note (Signed)
Addended by: Ivy Lynn on: 07/17/2021 01:21 PM   Modules accepted: Orders

## 2021-07-20 ENCOUNTER — Ambulatory Visit (HOSPITAL_COMMUNITY): Payer: Medicaid Other

## 2021-07-24 ENCOUNTER — Ambulatory Visit (HOSPITAL_COMMUNITY)
Admission: RE | Admit: 2021-07-24 | Discharge: 2021-07-24 | Disposition: A | Discharge status: Home / Self Care | Payer: Medicaid Other | Source: Ambulatory Visit | Attending: Family | Admitting: Family

## 2021-07-24 ENCOUNTER — Other Ambulatory Visit: Payer: Self-pay

## 2021-07-24 DIAGNOSIS — M542 Cervicalgia: Secondary | ICD-10-CM | POA: Diagnosis not present

## 2021-07-31 ENCOUNTER — Telehealth: Payer: Self-pay | Admitting: Family

## 2021-07-31 DIAGNOSIS — N1831 Chronic kidney disease, stage 3a: Secondary | ICD-10-CM

## 2021-07-31 NOTE — Telephone Encounter (Signed)
Patient still requesting kidney referral from phone message 10/12

## 2021-07-31 NOTE — Telephone Encounter (Signed)
Referral to Nephrologists pending.

## 2021-08-01 ENCOUNTER — Other Ambulatory Visit: Payer: Self-pay

## 2021-08-01 ENCOUNTER — Ambulatory Visit (HOSPITAL_COMMUNITY): Payer: Medicaid Other | Attending: Family | Admitting: Physical Therapy

## 2021-08-01 DIAGNOSIS — M542 Cervicalgia: Secondary | ICD-10-CM | POA: Diagnosis not present

## 2021-08-01 NOTE — Therapy (Signed)
Patricia Stanton [address] [place], [place], [address] Phone: [phone]   Fax:  [phone]  Physical Therapy Evaluation  Patient Details  Name: Patricia Stanton MRN: [id number] Date of Birth: 1986-05-28 Referring Provider (PT): Evelina Dun   Encounter Date: 08/01/2021   PT End of Session - 08/01/21 0852     Visit Number 1    Number of Visits 8    Date for PT Re-Evaluation 08/31/21    Authorization Type healthy blu    Authorization - Number of Visits 27    Progress Note Due on Visit 10    PT Start Time 0830    PT Stop Time 0850    PT Time Calculation (min) 20 min    Activity Tolerance Patient tolerated treatment well    Behavior During Therapy [facility] for tasks assessed/performed             Past Medical History:  Diagnosis Date   Adrenal tumor    Anxiety    Arthritis    left knee   Asthma    Chronic abdominal pain    Chronic headaches    Depression    Diabetes mellitus type 2 in obese ([place]) 11/20/2016   Endometriosis    Gastroesophageal reflux disease 11/09/2020   HLD (hyperlipidemia) 02/17/2017   Hypertension    Hypertension 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 ([place]) 03/25/2019   Mood disorder ([place])    Obesity    Ovarian cyst    Tobacco abuse     Past Surgical History:  Procedure Laterality Date   CESAREAN SECTION  2008   [place]   CHOLECYSTECTOMY N/A 11/25/2014   Procedure: LAPAROSCOPIC CHOLECYSTECTOMY;  Surgeon: Aviva Signs Md, MD;  Location: [facility];  Service: General;  Laterality: N/A;   ESOPHAGOGASTRODUODENOSCOPY N/A 12/21/2015   Dr. Gala Romney: normal esophagus, non-bleeding erosive gastropathy, normal second portion of duodenum. Reactive gastropathy/chemical gastritis, negative H.pylori   ESOPHAGOGASTRODUODENOSCOPY (EGD) WITH PROPOFOL N/A 11/23/2020    Surgeon: Daneil Dolin, MD; normal esophagus, small hiatal hernia, normal examined duodenum.   FEMUR FRACTURE SURGERY Left 1995   tree  fell on her   [place] N/A 01/29/2016   Procedure: Fatima Blank HERNIORRHAPHY WITH MESH;  Surgeon: Aviva Signs, MD;  Location: [facility];  Service: General;  Laterality: N/A;   INSERTION OF MESH  01/29/2016   Procedure: INSERTION OF MESH;  Surgeon: Aviva Signs, MD;  Location: [facility];  Service: General;;   KNEE ARTHROSCOPY Left    LUMBAR LAMINECTOMY/DECOMPRESSION MICRODISCECTOMY Right 05/21/2019   Procedure: Microdiscectomy - Lumbar four-Lumbar five - right;  Surgeon: Eustace Moore, MD;  Location: [facility];  Service: Neurosurgery;  Laterality: Right;   TUBAL LIGATION      There were no vitals filed for this visit.    Subjective Assessment - 08/01/21 0843     Subjective [name] states that she has been having cervical pain that goes into the back of her head which cause migraines. She states at times her migraines last a week.  She states that she gets swelling in the back of her neck.  Pt states that she has neck massagers that she uses and has used heat and Ice; ice does  better than heat.    Patient Stated Goals "I don't have one"                [facility] PT Assessment - 08/01/21 0001  Assessment   Medical Diagnosis Cervical pain    Referring Provider (PT) Evelina Dun    Onset Date/Surgical Date --   not scheduled   Next MD Visit not scheduled    Prior Therapy no      Precautions   Precautions None      Restrictions   Weight Bearing Restrictions No      Balance Screen   Has the patient fallen in the past 6 months No    Has the patient had a decrease in activity level because of a fear of falling?  No    Is the patient reluctant to leave their home because of a fear of falling?  No      Prior Function   Level of Independence Independent      Cognition   Overall Cognitive Status Within Functional Limits for tasks assessed      Posture/Postural Control   Posture/Postural Control Postural limitations    Postural Limitations Rounded  Shoulders;Increased thoracic kyphosis;Decreased lumbar lordosis      ROM / Strength   AROM / PROM / Strength AROM;Strength      AROM   AROM Assessment Site Cervical    Cervical Flexion 25   reps increase pain   Cervical Extension 35   no change of sx with reps.   Cervical - Right Side Bend 30    Cervical - Left Side Bend 25    Cervical - Right Rotation 48    Cervical - Left Rotation 40      Strength   Strength Assessment Site Cervical    Cervical Extension 3-/5    Cervical - Right Side Bend 3-/5    Cervical - Left Side Bend 3-/5                        Objective measurements completed on examination: See above findings.       [facility] Adult PT Treatment/Exercise - 08/01/21 0001       Exercises   Exercises Neck      Neck Exercises: Seated   Cervical Isometrics Right lateral flexion;Left lateral flexion;3 secs    Cervical Rotation Both;10 reps    Lateral Flexion Both;10 reps                       PT Short Term Goals - 08/01/21 0906       PT SHORT TERM GOAL #1   Title Pt to be I in HEP to improve cervical rotation by 10 degrees to be able to look to her side    Time 2    Period Weeks    Status New    Target Date 08/15/21      PT SHORT TERM GOAL #2   Title Pt cervical strength to be improved by 1/2 grade to decrease pain to no greater than a 8/10    Time 2    Period Weeks    Status New    Target Date 08/15/21               PT Long Term Goals - 08/01/21 0909       PT LONG TERM GOAL #1   Title PT to be I in advance HEP to decrease pain to no greater than a 6/10    Time 4    Period Weeks    Status New    Target Date 08/31/21      PT LONG TERM GOAL #  2   Title PT to be able to rotate cervical spine to 60 degrees to look behind her shoulder    Time 4    Period Weeks    Status New      PT LONG TERM GOAL #3   Title PT cervical spine strength to be at lease 4/5 to allow migrane frequency to be decreased by 30%    Time 4     Period Weeks    Status New                    Plan - 08/01/21 0859     Clinical Impression Statement [name] states that she has been having neck pain that goes up into her head and causes migraine.  She states at times her migraines last for days.  She has been referred to skilled PT for her pain.  Evaluation demonstrates decreased ROM, decreased strength and postural dysfunction.  Ms. Daffron will benefit from skilled PT to assist in improving her sx.    Personal Factors and Comorbidities Comorbidity 3+;Fitness;Past/Current Experience;Time since onset of injury/illness/exacerbation;Behavior Pattern    Comorbidities anxiety, lumbar surgery, obesity, DM, HTN    Examination-Activity Limitations Lift;Reach Overhead;Dressing;Carry    Examination-Participation Restrictions Cleaning;Driving;Laundry;Meal Prep;Shop    Stability/Clinical Decision Making Evolving/Moderate complexity    Clinical Decision Making Moderate    Rehab Potential Fair    PT Frequency 2x / week    PT Duration 4 weeks    PT Treatment/Interventions Manual techniques;Patient/family education;Therapeutic exercise;Therapeutic activities    PT Next Visit Plan cervical and scapular retraction, axial extension, wback manual as needed    PT Home Exercise Plan ROM, isometric for side bending    Consulted and Agree with Plan of Care Patient             Patient will benefit from skilled therapeutic intervention in order to improve the following deficits and impairments:  Decreased activity tolerance, Decreased range of motion, Decreased strength, Pain, Impaired UE functional use, Postural dysfunction  Visit Diagnosis: Cervicalgia - Plan: PT plan of care cert/re-cert     Problem List Patient Active Problem List   Diagnosis Date Noted   Chronic kidney disease, stage 3a ([place]) 07/31/2021   Upper respiratory infection with cough and congestion 07/17/2021   Elevated ALT measurement 01/04/2021   Gastroesophageal reflux  disease 11/09/2020   S/P lumbar laminectomy 05/21/2019   Simple chronic bronchitis ([place]) 03/25/2019   Hypertension 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 ([place]) 03/25/2019   Chronic abdominal pain 03/25/2019   Leukocytosis 11/19/2018   Noncompliance 06/03/2017   Hyperlipidemia associated with type 2 diabetes mellitus ([place]) 02/17/2017   Major depressive disorder, recurrent episode, moderate ([place]) 02/03/2017   Vitamin D deficiency 11/20/2016   Diabetes mellitus type 2 in obese ([place]) 11/20/2016   PTSD (post-traumatic stress disorder) 11/19/2016   History of hernia surgery 11/19/2016   Pulmonary nodule/lesion, solitary 11/19/2016   Mucosal abnormality of stomach    Non-intractable vomiting 12/19/2015   Constipation 12/19/2015   Tobacco abuse 06/06/2013   Morbid obesity ([place]) 06/06/2013   Rayetta Humphrey, PT CLT [phone] 08/01/2021, 9:14 AM  [facility] [address] [place], [place], [address] Phone: [phone]   Fax:  [phone]  Name: Patricia Stanton MRN: [id number] Date of Birth: 09-28-1985

## 2021-08-03 ENCOUNTER — Telehealth: Payer: Self-pay | Admitting: Family

## 2021-08-03 NOTE — Telephone Encounter (Signed)
Dawn called from [facility] stating that they received patients referral but per referral, it should have been sent to [facility].

## 2021-08-04 NOTE — Progress Notes (Signed)
Referring Provider: Sharion Balloon, FNP Primary Care Physician:  Sharion Balloon, FNP Primary GI Physician: Dr. Gala Romney  Chief Complaint  Patient presents with   Diarrhea    Linzess 145 too strong    HPI:   Patricia Stanton is a 35 y.o. female presenting today for follow-up of GERD, constipation, abdominal pain/bloating, and low iron saturation.   She has chronic history of constipation, GERD, and upper abdominal pain-  prior cholecystectomy in 2016 and ventral hernia repair with mesh in 2017.  CT A/P with contrast February 2022 with no acute abnormalities.  EGD March 2022 with small hiatal hernia.  Suspected abdominal wall pain and she was referred to Dr. Arnoldo Morale for evaluation of incisional hernia in March, though Dr. Arnoldo Morale did not feel she had a surgical hernia.  Last seen in our office 01/04/2021.  She reported her abdominal pain had improved with better management of constipation.  She was on Linzess 145 mcg daily with 2-3 formed BMs daily.  Still with bloating 2-3 times a week with associated generalized abdominal discomfort, improved by getting up and moving around.  Sometimes associated with dairy.  Noted she was consuming several cruciferous vegetables.  GERD well controlled on Protonix 40 mg twice daily.  Per chart review, I noticed she had slight elevation of ALT.  No significant risk factors for elevated liver enzymes aside from history of fatty liver, and she was trying to lose weight.  Plan included screening for hepatitis and hemochromatosis, counseled on fatty liver, advised to avoid gas producing items, follow a lactose-free diet, use simethicone as needed, start daily probiotic, continue Linzess and Protonix.   Labs completed 01/04/2021: Hepatitis A and B negative, hepatitis C previously negative, iron panel with normal ferritin and low saturation at 9%.  Recommended repeating CBC and iron panel in 8 weeks.  She was also mailed prescription for hep a and B vaccine.  Labs  completed 03/13/2021: Hemoglobin 13.9 with microcytic indices, iron 41, iron saturation 9%, ferritin 29.  Today:  GERD: On Protonix 40 mg BID. No dysphagia.  Occasional nausea without vomiting if eating a trigger like tomato based products or spicy foods.   Constipation: Bowels are moving daily.  No longer needing Linzess.  Linzess 145 mcg ended up being too strong.  Noticed improvement in bowel regularity with changes in her diet-cutting out carbs and sugar, eating more fiber, and drinking more water. Still wanting Linzess to use PRN, requesting 72 mcg dose.     Regarding low iron saturation, she denies bright red blood per rectum, melena.  She admits to menorrhagia.  She did start taking a women's One-A-Day vitamin which contains iron about 2 months ago.  Elevated ALT: ALT returned to normal in June and remained normal in October. Intentional weight loss of 16 pounds over the last 7 months through dietary changes.  Most recent hemoglobin A1c up to 11.2.  Patient states she has been referred to a kidney doctor as she feels her kidneys are influencing her blood sugars/ability for diabetes medications to work effectively.   Past Medical History:  Diagnosis Date   Adrenal tumor    Anxiety    Arthritis    left knee   Asthma    Chronic abdominal pain    Chronic headaches    Depression    Diabetes mellitus type 2 in obese ([place]) 11/20/2016   Endometriosis    Gastroesophageal reflux disease 11/09/2020   HLD (hyperlipidemia) 02/17/2017   Hypertension  Hypertension 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 ([place]) 03/25/2019   Mood disorder ([place])    Obesity    Ovarian cyst    Tobacco abuse     Past Surgical History:  Procedure Laterality Date   CESAREAN SECTION  2008   [place]   CHOLECYSTECTOMY N/A 11/25/2014   Procedure: LAPAROSCOPIC CHOLECYSTECTOMY;  Surgeon: Aviva Signs Md, MD;  Location: [facility];  Service: General;  Laterality: N/A;   ESOPHAGOGASTRODUODENOSCOPY N/A  12/21/2015   Dr. Gala Romney: normal esophagus, non-bleeding erosive gastropathy, normal second portion of duodenum. Reactive gastropathy/chemical gastritis, negative H.pylori   ESOPHAGOGASTRODUODENOSCOPY (EGD) WITH PROPOFOL N/A 11/23/2020    Surgeon: Daneil Dolin, MD; normal esophagus, small hiatal hernia, normal examined duodenum.   FEMUR FRACTURE SURGERY Left 1995   tree fell on her   [place] N/A 01/29/2016   Procedure: Fatima Blank HERNIORRHAPHY WITH MESH;  Surgeon: Aviva Signs, MD;  Location: [facility];  Service: General;  Laterality: N/A;   INSERTION OF MESH  01/29/2016   Procedure: INSERTION OF MESH;  Surgeon: Aviva Signs, MD;  Location: [facility];  Service: General;;   KNEE ARTHROSCOPY Left    LUMBAR LAMINECTOMY/DECOMPRESSION MICRODISCECTOMY Right 05/21/2019   Procedure: Microdiscectomy - Lumbar four-Lumbar five - right;  Surgeon: Eustace Moore, MD;  Location: [facility];  Service: Neurosurgery;  Laterality: Right;   TUBAL LIGATION      Current Outpatient Medications  Medication Sig Dispense Refill   albuterol (PROVENTIL) (2.5 MG/3ML) 0.083% nebulizer solution Take 3 mLs (2.5 mg total) by nebulization every 6 (six) hours as needed for wheezing or shortness of breath. 150 mL 1   albuterol (VENTOLIN HFA) 108 (90 Base) MCG/ACT inhaler Inhale 2 puffs into the lungs every 6 (six) hours as needed for wheezing or shortness of breath. 18 g 6   budesonide-formoterol (SYMBICORT) 80-4.5 MCG/ACT inhaler Inhale 2 puffs into the lungs 2 (two) times daily. 1 each 3   cetirizine (ZYRTEC) 10 MG tablet Take 1 tablet (10 mg total) by mouth daily. 90 tablet 1   escitalopram (LEXAPRO) 20 MG tablet Take 1 tablet (20 mg total) by mouth daily. 90 tablet 0   gabapentin (NEURONTIN) 600 MG tablet Take 600 mg by mouth 3 (three) times daily.     linaclotide (LINZESS) 72 MCG capsule Take 1 capsule (72 mcg total) by mouth daily before breakfast. 30 capsule 3   lisinopril-hydrochlorothiazide  (ZESTORETIC) 20-25 MG tablet Take 1 tablet by mouth daily. 90 tablet 1   lovastatin (MEVACOR) 20 MG tablet TAKE 1 TABLET(20 MG) BY MOUTH AT BEDTIME 90 tablet 0   methocarbamol (ROBAXIN) 500 MG tablet Take 1 tablet (500 mg total) by mouth 3 (three) times daily as needed for muscle spasms. 90 tablet 2   oxyCODONE-acetaminophen (PERCOCET/ROXICET) 5-325 MG tablet Take 1 tablet by mouth every 4 (four) hours as needed for moderate pain. 30 tablet 0   prazosin (MINIPRESS) 1 MG capsule Take 3 capsules (3 mg total) by mouth at bedtime. 270 capsule 0   RYBELSUS 7 MG TABS Take 1 tablet by mouth daily.     pantoprazole (PROTONIX) 40 MG tablet Take 1 tablet (40 mg total) by mouth 2 (two) times daily. 180 tablet 3   No current facility-administered medications for this visit.    Allergies as of 08/06/2021 - Review Complete 08/06/2021  Allergen Reaction Noted   Tramadol Other (See Comments) 05/21/2019    Family History  Problem Relation Age of  Onset   Hypertension Mother    Hyperlipidemia Mother    Fibromyalgia Mother    Other Mother        degenerative disc disease   Arthritis Mother    Kidney disease Mother    Diabetes Father    Hypertension Father    Hyperlipidemia Father    Heart disease Father 24   Heart attack Father    Stroke Father    Hyperlipidemia Brother    Hypertension Brother    Aneurysm Maternal Grandmother        AAA   Kidney disease Maternal Grandmother    Kidney disease Maternal Grandfather    Aneurysm Paternal Grandmother        AAA   Kidney disease Paternal Grandmother    Kidney disease Paternal Grandfather    Colon cancer Neg Hx    Inflammatory bowel disease Neg Hx     Social History   Socioeconomic History   Marital status: Divorced    Spouse name: Not on file   Number of children: 1   Years of education: 14   Highest education level: Not on file  Occupational History   Occupation: disability  Tobacco Use   Smoking status: Every Day    Packs/day: 0.50     Years: 11.00    Pack years: 5.50    Types: Cigarettes    Start date: 09/17/2003   Smokeless tobacco: Never  Vaping Use   Vaping Use: Never used  Substance and Sexual Activity   Alcohol use: No   Drug use: No   Sexual activity: Yes    Birth control/protection: Surgical    Comment: BTL  Other Topics Concern   Not on file  Social History Narrative   Disabled from nursing   Lives at home with daughter [name]   Social Determinants of Health   Financial Resource Strain: Low Risk    Difficulty of Paying Living Expenses: Not hard at all  Food Insecurity: No Food Insecurity   Worried About Charity fundraiser in the Last Year: Never true   [place] in the Last Year: Never true  Transportation Needs: No Transportation Needs   Lack of Transportation (Medical): No   Lack of Transportation (Non-Medical): No  Physical Activity: Inactive   Days of Exercise per Week: 0 days   Minutes of Exercise per Session: 0 min  Stress: Stress Concern Present   Feeling of Stress : Very much  Social Connections: Socially Isolated   Frequency of Communication with Friends and Family: More than three times a week   Frequency of Social Gatherings with Friends and Family: More than three times a week   Attends Religious Services: Never   Marine scientist or Organizations: No   Attends Music therapist: Never   Marital Status: Never married    Review of Systems: Gen: Denies fever, chills, cold or flulike symptoms, presyncope, syncope. CV: Denies chest pain, palpitations. Resp: Denies dyspnea or cough. GI: See HPI  Heme: See HPI  Physical Exam: BP (!) 175/92   Pulse 98   Temp (!) 97.1 F (36.2 C) (Temporal)   Ht 5\' 5"  (1.651 m)   Wt 277 lb 6.4 oz (125.8 kg)   LMP 07/13/2021 (Exact Date)   BMI 46.16 kg/m  General:   Alert and oriented. No distress noted. Pleasant and cooperative.  Head:  Normocephalic and atraumatic. Eyes:  Conjuctiva clear without scleral  icterus. Heart:  S1, S2 present without murmurs appreciated.  Lungs:  Clear to auscultation bilaterally. No wheezes, rales, or rhonchi. No distress.  Abdomen:  +BS, soft, non-tender and non-distended. No rebound or guarding. No HSM or masses noted. Msk:  Symmetrical without gross deformities. Normal posture. Extremities:  Without edema. Neurologic:  Alert and  oriented x4 Psych: Normal mood and affect.    Assessment:  35 year old female with history of GERD, constipation, abdominal pain/bloating, low iron saturation, and elevated ALT presenting today for follow-up.  GERD:  Well-controlled on Protonix 40 mg twice daily.  No alarm symptoms.  Constipation:  Chronic.  Improved/resolved with dietary changes. No longer needing Linzess 145 mcg, but is requesting Linzess 72 mcg to have on hand to use as needed.  Abdominal pain/bloating:  Resolved with better management of constipation, dietary changes (limiting cars/sugar), and weight loss.   Elevated ALT:  Resolved.  Noted minimal ALT elevation at 31 in March 2022 and 33 in April 2022.  Returned to normal in June and remained normal in October.  Likely secondary to fatty liver.  Denies alcohol and illicit drug use. No concerning OTC supplements. We have ruled out hepatitis B and C as well as hemochromatosis.  Notably, she has made dietary changes and has lost 16 pounds over the last 7 months.  She was encouraged to continue with weight loss efforts.   Low iron saturation: Iron saturation 9% with low normal iron and ferritin in April 2022.  Repeat labs in June again with iron saturation 9%, low normal iron and ferritin (stable), hemoglobin 13.9.  Most recent labs October 2022 with hemoglobin 12.8; this is within her normal baseline fluctuations.  Suspect low saturation likely secondary to menorrhagia.  She denies overt GI bleeding.  EGD on file this year with normal exam.  No prior colonoscopy.  At this point, would recommend monitoring as she has  no alarm symptoms from a GI standpoint.  No family history of colon cancer.  If declining hemoglobin, consider colonoscopy.    Plan:  Continue Protonix 40 mg twice daily. Reinforced GERD diet/lifestyle. Linzess 72 mcg as needed. Encouraged to continue with dietary changes/weight loss efforts. Recommend monitoring LFTs every 6 months.  This can be completed with routine blood work with PCP.  Recommend monitoring CBC every 6 months.  This can be completed with routine blood work with PCP.  If declining hemoglobin, consider colonoscopy. Follow-up in 6 months.   Aliene Altes, PA-C [facility] 08/06/2021

## 2021-08-06 ENCOUNTER — Ambulatory Visit: Payer: Medicaid Other | Admitting: Gastroenterology

## 2021-08-06 ENCOUNTER — Encounter: Payer: Self-pay | Admitting: Gastroenterology

## 2021-08-06 ENCOUNTER — Telehealth: Payer: Self-pay | Admitting: Family

## 2021-08-06 ENCOUNTER — Other Ambulatory Visit: Payer: Self-pay

## 2021-08-06 VITALS — BP 175/92 | HR 98 | Temp 97.1°F | Ht 65.0 in | Wt 277.4 lb

## 2021-08-06 DIAGNOSIS — R7401 Elevation of levels of liver transaminase levels: Secondary | ICD-10-CM

## 2021-08-06 DIAGNOSIS — K219 Gastro-esophageal reflux disease without esophagitis: Secondary | ICD-10-CM | POA: Diagnosis not present

## 2021-08-06 DIAGNOSIS — R790 Abnormal level of blood mineral: Secondary | ICD-10-CM | POA: Insufficient documentation

## 2021-08-06 DIAGNOSIS — N1831 Chronic kidney disease, stage 3a: Secondary | ICD-10-CM

## 2021-08-06 DIAGNOSIS — E1169 Type 2 diabetes mellitus with other specified complication: Secondary | ICD-10-CM

## 2021-08-06 DIAGNOSIS — K5904 Chronic idiopathic constipation: Secondary | ICD-10-CM

## 2021-08-06 DIAGNOSIS — Z91199 Patient's noncompliance with other medical treatment and regimen due to unspecified reason: Secondary | ICD-10-CM

## 2021-08-06 MED ORDER — LINACLOTIDE 72 MCG PO CAPS: 72.0000 ug | ORAL_CAPSULE | Freq: Every day | ORAL | 3 refills | Status: DC

## 2021-08-06 MED ORDER — PANTOPRAZOLE SODIUM 40 MG PO TBEC: 40.0000 mg | DELAYED_RELEASE_TABLET | Freq: Two times a day (BID) | ORAL | 3 refills | Status: DC

## 2021-08-06 NOTE — Telephone Encounter (Signed)
REFERRAL REQUEST Telephone Note  Have you been seen at our office for this problem? yes (Advise that they may need an appointment with their PCP before a referral can be done)  Reason for Referral: pt was referred to kidney dr in [place] but she wants to got to [facility] in [place] Referral discussed with patient: yes  Best contact number of patient for referral team: [phone]    Has patient been seen by a specialist for this issue before: no  Patient provider preference for referral: [facility]  Patient location preference for referral: [place]   Patient notified that referrals can take up to a week or longer to process. If they haven't heard anything within a week they should call back and speak with the referral department.

## 2021-08-06 NOTE — Patient Instructions (Signed)
Continue pantoprazole 40 mg twice daily 30 minutes before breakfast and dinner.  Continue following a GERD diet/lifestyle: Avoid fried, fatty, greasy, spicy, citrus foods. Avoid caffeine and carbonated beverages. Avoid chocolate. Try eating 4-6 small meals a day rather than 3 large meals. Do not eat within 3 hours of laying down. Prop head of bed up on wood or bricks to create a 6 inch incline.  Use Linzess 72 mcg daily as needed for constipation.  Take 30 minutes before your first meal.  Congratulations on your weight loss thus far!  You are doing a fantastic job!  I recommend you continue with these efforts.  It was a pleasure seeing you again today!  I hope you have a very happy Thanksgiving!  We will see back in about 6 months.  Do not hesitate to call if you have any questions or concerns prior to your next visit.  Aliene Altes, PA-C [facility]

## 2021-08-07 NOTE — Telephone Encounter (Signed)
Referral sent.   Evelina Dun, FNP

## 2021-08-14 ENCOUNTER — Ambulatory Visit (HOSPITAL_COMMUNITY): Payer: Medicaid Other | Admitting: Physical Therapy

## 2021-08-15 ENCOUNTER — Telehealth: Payer: Self-pay | Admitting: Internal Medicine

## 2021-08-15 ENCOUNTER — Ambulatory Visit: Payer: Medicaid Other | Admitting: Nurse Practitioner

## 2021-08-15 ENCOUNTER — Encounter: Payer: Self-pay | Admitting: Nurse Practitioner

## 2021-08-15 VITALS — BP 150/88 | HR 87 | Temp 97.6°F | Resp 20 | Ht 65.0 in | Wt 278.0 lb

## 2021-08-15 DIAGNOSIS — B3731 Acute candidiasis of vulva and vagina: Secondary | ICD-10-CM | POA: Diagnosis not present

## 2021-08-15 MED ORDER — FLUCONAZOLE 150 MG PO TABS
ORAL_TABLET | ORAL | 0 refills | Status: DC
Start: 2021-08-15 — End: 2022-03-01

## 2021-08-15 NOTE — Progress Notes (Signed)
   Subjective:    Patient ID: Patricia Stanton, female    DOB: 13-Oct-1985, 35 y.o.   MRN: [id number]  Chief Complaint: Vaginitis   HPI Patient comes in today c/o vaginal discharge with itching. No fishy smell. Was on antibiotics at the beginning of the year.     Review of Systems  Constitutional:  Negative for diaphoresis.  Eyes:  Negative for pain.  Respiratory:  Negative for shortness of breath.   Cardiovascular:  Negative for chest pain, palpitations and leg swelling.  Gastrointestinal:  Negative for abdominal pain.  Endocrine: Negative for polydipsia.  Genitourinary:  Positive for vaginal discharge. Negative for dysuria, frequency and vaginal pain.  Skin:  Negative for rash.  Neurological:  Negative for dizziness, weakness and headaches.  Hematological:  Does not bruise/bleed easily.  All other systems reviewed and are negative.     Objective:   Physical Exam Vitals and nursing note reviewed.  Constitutional:      Appearance: Normal appearance.  Cardiovascular:     Rate and Rhythm: Normal rate and regular rhythm.     Heart sounds: Normal heart sounds.  Pulmonary:     Effort: Pulmonary effort is normal.     Breath sounds: Normal breath sounds.  Skin:    General: Skin is warm.  Neurological:     General: No focal deficit present.     Mental Status: She is alert and oriented to person, place, and time.  Psychiatric:        Mood and Affect: Mood normal.        Behavior: Behavior normal.   BP (!) 150/88   Pulse 87   Temp 97.6 F (36.4 C) (Temporal)   Resp 20   Ht 5\' 5"  (1.651 m)   Wt 278 lb (126.1 kg)   SpO2 96%   BMI 46.26 kg/m          Assessment & Plan:   Patricia Stanton in today with chief complaint of Vaginitis   1. Vaginal candidiasis no bubble baths No douching - fluconazole (DIFLUCAN) 150 MG tablet; 1 po q week x 4 weeks  Dispense: 4 tablet; Refill: 0    The above assessment and management plan was discussed with the patient. The  patient verbalized understanding of and has agreed to the management plan. Patient is aware to call the clinic if symptoms persist or worsen. Patient is aware when to return to the clinic for a follow-up visit. Patient educated on when it is appropriate to go to the emergency department.   Mary-Margaret Hassell Done, FNP

## 2021-08-15 NOTE — Patient Instructions (Signed)

## 2021-08-15 NOTE — Telephone Encounter (Signed)
ERROR

## 2021-08-16 ENCOUNTER — Ambulatory Visit (HOSPITAL_COMMUNITY): Payer: Medicaid Other | Attending: Family

## 2021-08-16 ENCOUNTER — Telehealth (HOSPITAL_COMMUNITY): Payer: Self-pay

## 2021-08-16 NOTE — Telephone Encounter (Signed)
No show, called and spoke to pt who stated her daughter is sick and she has been taking care of her.  Reminded next apt date and time with contact number included.  Requested pt to cancel/reschedule apts in the future if unable to make it.  Ihor Austin, LPTA/CLT; Delana Meyer [phone]

## 2021-08-21 ENCOUNTER — Ambulatory Visit (HOSPITAL_COMMUNITY): Payer: Medicaid Other | Admitting: Physical Therapy

## 2021-08-23 ENCOUNTER — Ambulatory Visit (HOSPITAL_COMMUNITY): Payer: Medicaid Other | Admitting: Physical Therapy

## 2021-08-28 ENCOUNTER — Encounter (HOSPITAL_COMMUNITY): Payer: Medicaid Other | Admitting: Physical Therapy

## 2021-08-30 ENCOUNTER — Encounter (HOSPITAL_COMMUNITY): Payer: Medicaid Other | Admitting: Physical Therapy

## 2021-09-03 ENCOUNTER — Other Ambulatory Visit: Payer: Self-pay | Admitting: Family

## 2021-09-04 ENCOUNTER — Encounter (HOSPITAL_COMMUNITY): Payer: Medicaid Other | Admitting: Physical Therapy

## 2021-09-06 ENCOUNTER — Encounter (HOSPITAL_COMMUNITY): Payer: Medicaid Other | Admitting: Physical Therapy
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
## 2021-09-18 ENCOUNTER — Other Ambulatory Visit: Payer: Self-pay

## 2021-09-18 ENCOUNTER — Telehealth: Payer: Medicaid Other | Admitting: Psychiatry

## 2021-09-18 DIAGNOSIS — N1831 Chronic kidney disease, stage 3a: Secondary | ICD-10-CM | POA: Diagnosis not present

## 2021-09-18 DIAGNOSIS — E1122 Type 2 diabetes mellitus with diabetic chronic kidney disease: Secondary | ICD-10-CM | POA: Diagnosis not present

## 2021-09-18 DIAGNOSIS — I129 Hypertensive chronic kidney disease with stage 1 through stage 4 chronic kidney disease, or unspecified chronic kidney disease: Secondary | ICD-10-CM | POA: Diagnosis not present

## 2021-09-22 ENCOUNTER — Other Ambulatory Visit: Payer: Self-pay | Admitting: Family

## 2021-10-03 ENCOUNTER — Encounter: Payer: Self-pay | Admitting: Family Medicine

## 2021-10-04 DIAGNOSIS — R519 Headache, unspecified: Secondary | ICD-10-CM | POA: Diagnosis not present

## 2021-10-04 DIAGNOSIS — M542 Cervicalgia: Secondary | ICD-10-CM | POA: Diagnosis not present
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
## 2021-10-10 ENCOUNTER — Telehealth (INDEPENDENT_AMBULATORY_CARE_PROVIDER_SITE_OTHER): Payer: Medicaid Other | Admitting: Psychiatry

## 2021-10-10 ENCOUNTER — Other Ambulatory Visit: Payer: Self-pay

## 2021-10-10 ENCOUNTER — Encounter: Payer: Self-pay | Admitting: Psychiatry

## 2021-10-10 DIAGNOSIS — G4700 Insomnia, unspecified: Secondary | ICD-10-CM

## 2021-10-10 DIAGNOSIS — F330 Major depressive disorder, recurrent, mild: Secondary | ICD-10-CM

## 2021-10-10 DIAGNOSIS — F4310 Post-traumatic stress disorder, unspecified: Secondary | ICD-10-CM | POA: Diagnosis not present

## 2021-10-10 NOTE — Patient Instructions (Signed)
1. Continue lexapro 20 mg daily 2. Continue prazosin 3 mg at night 3. Next appointment: 3/8 at 1:20

## 2021-10-26 DIAGNOSIS — H5213 Myopia, bilateral: Secondary | ICD-10-CM | POA: Diagnosis not present

## 2021-11-09 ENCOUNTER — Ambulatory Visit: Payer: Medicaid Other | Admitting: Nurse Practitioner

## 2021-12-10 ENCOUNTER — Ambulatory Visit: Payer: Medicaid Other | Admitting: Podiatry
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
## 2022-01-15 ENCOUNTER — Telehealth: Payer: Medicaid Other | Admitting: Psychiatry

## 2022-01-16 ENCOUNTER — Encounter: Payer: Self-pay | Admitting: Internal Medicine
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
## 2022-02-01 ENCOUNTER — Encounter: Payer: Self-pay | Admitting: Psychiatry

## 2022-02-01 ENCOUNTER — Telehealth (INDEPENDENT_AMBULATORY_CARE_PROVIDER_SITE_OTHER): Payer: Medicaid Other | Admitting: Psychiatry

## 2022-02-01 DIAGNOSIS — F330 Major depressive disorder, recurrent, mild: Secondary | ICD-10-CM

## 2022-02-01 DIAGNOSIS — F4310 Post-traumatic stress disorder, unspecified: Secondary | ICD-10-CM | POA: Diagnosis not present

## 2022-02-01 DIAGNOSIS — G4700 Insomnia, unspecified: Secondary | ICD-10-CM

## 2022-02-01 NOTE — Patient Instructions (Addendum)
Continue lexapro 20 mg daily  Continue prazosin 3 mg at night Next appointment:  8/14 at 11 AM, in person  The next visit will be in person visit. Please arrive 15 mins before the scheduled time.   [facility]  Address: [address], [place], [place] [address]

## 2022-03-01 ENCOUNTER — Ambulatory Visit
Admission: RE | Admit: 2022-03-01 | Discharge: 2022-03-01 | Disposition: A | Discharge status: Home / Self Care | Payer: Medicaid Other | Source: Ambulatory Visit | Attending: Nurse Practitioner | Admitting: Nurse Practitioner

## 2022-03-01 VITALS — BP 159/77 | HR 112 | Temp 98.6°F | Resp 18

## 2022-03-01 DIAGNOSIS — N898 Other specified noninflammatory disorders of vagina: Secondary | ICD-10-CM

## 2022-03-01 DIAGNOSIS — B3731 Acute candidiasis of vulva and vagina: Secondary | ICD-10-CM | POA: Diagnosis not present

## 2022-03-01 LAB — POCT URINALYSIS DIP (MANUAL ENTRY)
Bilirubin, UA: NEGATIVE
Blood, UA: NEGATIVE
Glucose, UA: >=1000 mg/dL — AB
Leukocytes, UA: NEGATIVE
Nitrite, UA: NEGATIVE
Protein Ur, POC: 30 mg/dL — AB
Spec Grav, UA: 1.025
Urobilinogen, UA: 0.2 U/dL
pH, UA: 5.5

## 2022-03-01 MED ORDER — FLUCONAZOLE 150 MG PO TABS
150.0000 mg | ORAL_TABLET | Freq: Once | ORAL | 0 refills | Status: AC
Start: 2022-03-01 — End: 2022-03-01

## 2022-03-01 NOTE — ED Provider Notes (Signed)
[facility]    CSN: [id number] Arrival date & time: 03/01/22  3557      History   Chief Complaint Chief Complaint  Patient presents with   Vaginal Itching    Yeast infection - Entered by patient    HPI Patricia Stanton is a 36 y.o. female.   Patient presents with vaginal itching and irritation for the past few days.  Reports she also has burning on the outside of her vagina when she urinates.  She denies any vaginal discharge, however does endorse a fruity odor.  She denies any vaginal rashes, sores, lesions.  No urinary frequency, dysuria, urgency, blood in her urine, abdominal pain, new back pain, fevers, nausea/vomiting.  Reports a history of frequent yeast infections.  Also reports a history of diabetes mellitus; reports blood sugars are between 100-150.  Also reports a medical history significant For chronic kidney disease, hypertension, chronic bronchitis.  Reports he took herself off her hypertension medicines secondary to losing weight.    Past Medical History:  Diagnosis Date   Adrenal tumor    Anxiety    Arthritis    left knee   Asthma    Chronic abdominal pain    Chronic headaches    Depression    Diabetes mellitus type 2 in obese ([place]) 11/20/2016   Endometriosis    Gastroesophageal reflux disease 11/09/2020   HLD (hyperlipidemia) 02/17/2017   Hypertension    Hypertension 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 ([place]) 03/25/2019   Mood disorder ([place])    Obesity    Ovarian cyst    Tobacco abuse     Patient Active Problem List   Diagnosis Date Noted   Abnormal iron saturation 08/06/2021   Chronic kidney disease, stage 3a ([place]) 07/31/2021   Upper respiratory infection with cough and congestion 07/17/2021   Elevated ALT measurement 01/04/2021   Gastroesophageal reflux disease 11/09/2020   S/P lumbar laminectomy 05/21/2019   Simple chronic bronchitis ([place]) 03/25/2019   Hypertension associated with chronic kidney disease  due to type 2 diabetes mellitus ([place]) 03/25/2019   Chronic abdominal pain 03/25/2019   Leukocytosis 11/19/2018   Noncompliance 06/03/2017   Hyperlipidemia associated with type 2 diabetes mellitus ([place]) 02/17/2017   Major depressive disorder, recurrent episode, moderate ([place]) 02/03/2017   Vitamin D deficiency 11/20/2016   Diabetes mellitus type 2 in obese ([place]) 11/20/2016   PTSD (post-traumatic stress disorder) 11/19/2016   History of hernia surgery 11/19/2016   Pulmonary nodule/lesion, solitary 11/19/2016   Mucosal abnormality of stomach    Non-intractable vomiting 12/19/2015   Constipation 12/19/2015   Tobacco abuse 06/06/2013   Morbid obesity ([place]) 06/06/2013    Past Surgical History:  Procedure Laterality Date   CESAREAN SECTION  2008   [place]   CHOLECYSTECTOMY N/A 11/25/2014   Procedure: LAPAROSCOPIC CHOLECYSTECTOMY;  Surgeon: Aviva Signs Md, MD;  Location: [facility];  Service: General;  Laterality: N/A;   ESOPHAGOGASTRODUODENOSCOPY N/A 12/21/2015   Dr. Gala Romney: normal esophagus, non-bleeding erosive gastropathy, normal second portion of duodenum. Reactive gastropathy/chemical gastritis, negative H.pylori   ESOPHAGOGASTRODUODENOSCOPY (EGD) WITH PROPOFOL N/A 11/23/2020    Surgeon: Daneil Dolin, MD; normal esophagus, small hiatal hernia, normal examined duodenum.   FEMUR FRACTURE SURGERY Left 1995   tree fell on her   [place] N/A 01/29/2016   Procedure: Fatima Blank HERNIORRHAPHY WITH MESH;  Surgeon: Aviva Signs, MD;  Location: [facility];  Service: General;  Laterality: N/A;  INSERTION OF MESH  01/29/2016   Procedure: INSERTION OF MESH;  Surgeon: Aviva Signs, MD;  Location: [facility];  Service: General;;   KNEE ARTHROSCOPY Left    LUMBAR LAMINECTOMY/DECOMPRESSION MICRODISCECTOMY Right 05/21/2019   Procedure: Microdiscectomy - Lumbar four-Lumbar five - right;  Surgeon: Eustace Moore, MD;  Location: [facility];  Service: Neurosurgery;  Laterality: Right;   TUBAL  LIGATION      OB History   No obstetric history on file.      Home Medications    Prior to Admission medications   Medication Sig Start Date End Date Taking? Authorizing Provider  albuterol (PROVENTIL) (2.5 MG/3ML) 0.083% nebulizer solution Take 3 mLs (2.5 mg total) by nebulization every 6 (six) hours as needed for wheezing or shortness of breath. 06/25/21   Sharion Balloon, FNP  albuterol (VENTOLIN HFA) 108 (90 Base) MCG/ACT inhaler Inhale 2 puffs into the lungs every 6 (six) hours as needed for wheezing or shortness of breath. 07/17/21   Ivy Lynn, NP  budesonide-formoterol (SYMBICORT) 80-4.5 MCG/ACT inhaler Inhale 2 puffs into the lungs 2 (two) times daily. 07/17/21   Ivy Lynn, NP  cetirizine (ZYRTEC) 10 MG tablet Take 1 tablet (10 mg total) by mouth daily. 06/08/20   Evelina Dun A, FNP  escitalopram (LEXAPRO) 20 MG tablet Take 1 tablet (20 mg total) by mouth daily. 06/20/21 09/18/21  Norman Clay, MD  fluconazole (DIFLUCAN) 150 MG tablet Take 1 tablet (150 mg total) by mouth once for 1 dose. Repeat in 3 days if symptoms persist 03/01/22 03/01/22  Eulogio Bear, NP  gabapentin (NEURONTIN) 600 MG tablet Take 600 mg by mouth 3 (three) times daily. 11/05/20   [provider]  linaclotide Rolan Lipa) 72 MCG capsule Take 1 capsule (72 mcg total) by mouth daily before breakfast. 08/06/21   Erenest Rasher, PA-C  lisinopril-hydrochlorothiazide (ZESTORETIC) 20-25 MG tablet Take 1 tablet by mouth daily. 11/29/20   Ivy Lynn, NP  lovastatin (MEVACOR) 20 MG tablet TAKE 1 TABLET(20 MG) BY MOUTH AT BEDTIME 09/03/21   Hawks, Alyse Low A, FNP  methocarbamol (ROBAXIN) 500 MG tablet Take 1 tablet (500 mg total) by mouth 3 (three) times daily as needed for muscle spasms. 06/28/21   Evelina Dun A, FNP  oxyCODONE-acetaminophen (PERCOCET/ROXICET) 5-325 MG tablet Take 1 tablet by mouth every 4 (four) hours as needed for moderate pain. 05/21/19   Eustace Moore, MD  pantoprazole  (PROTONIX) 40 MG tablet Take 1 tablet (40 mg total) by mouth 2 (two) times daily. 08/06/21   Erenest Rasher, PA-C  prazosin (MINIPRESS) 1 MG capsule Take 3 capsules (3 mg total) by mouth at bedtime. 06/20/21 09/18/21  Norman Clay, MD  RYBELSUS 7 MG TABS TAKE 1 TABLET BY MOUTH DAILY 09/24/21   Sharion Balloon, FNP    Family History Family History  Problem Relation Age of Onset   Hypertension Mother    Hyperlipidemia Mother    Fibromyalgia Mother    Other Mother        degenerative disc disease   Arthritis Mother    Kidney disease Mother    Diabetes Father    Hypertension Father    Hyperlipidemia Father    Heart disease Father 6   Heart attack Father    Stroke Father    Hyperlipidemia Brother    Hypertension Brother    Aneurysm Maternal Grandmother        AAA   Kidney disease Maternal Grandmother    Kidney disease  Maternal Grandfather    Aneurysm Paternal Grandmother        AAA   Kidney disease Paternal Grandmother    Kidney disease Paternal Grandfather    Colon cancer Neg Hx    Inflammatory bowel disease Neg Hx     Social History Social History   Tobacco Use   Smoking status: Every Day    Packs/day: 0.50    Years: 11.00    Total pack years: 5.50    Types: Cigarettes    Start date: 09/17/2003   Smokeless tobacco: Never  Vaping Use   Vaping Use: Never used  Substance Use Topics   Alcohol use: No   Drug use: No     Allergies   Tramadol   Review of Systems Review of Systems Per HPI  Physical Exam Triage Vital Signs ED Triage Vitals  Enc Vitals Group     BP 03/01/22 0946 (!) 159/77     Pulse Rate 03/01/22 0946 (!) 112     Resp 03/01/22 0946 18     Temp 03/01/22 0946 98.6 F (37 C)     Temp Source 03/01/22 0946 Oral     SpO2 03/01/22 0946 97 %     Weight --      Height --      Head Circumference --      Peak Flow --      Pain Score 03/01/22 0947 0     Pain Loc --      Pain Edu? --      Excl. in [place]? --    No data found.  Updated Vital  Signs BP (!) 159/77 (BP Location: Right Arm)   Pulse (!) 112   Temp 98.6 F (37 C) (Oral)   Resp 18   LMP 02/04/2022 (Approximate)   SpO2 97%   Visual Acuity Right Eye Distance:   Left Eye Distance:   Bilateral Distance:    Right Eye Near:   Left Eye Near:    Bilateral Near:     Physical Exam Vitals and nursing note reviewed.  Constitutional:      General: She is not in acute distress.    Appearance: Normal appearance. She is not toxic-appearing.  Abdominal:     General: Abdomen is flat. There is no distension.  Genitourinary:    Comments: Deferred-self swab performed Skin:    General: Skin is warm and dry.     Coloration: Skin is not jaundiced or pale.     Findings: No erythema.  Neurological:     Mental Status: She is alert and oriented to person, place, and time.     Motor: No weakness.     Gait: Gait normal.  Psychiatric:        Mood and Affect: Mood normal.        Behavior: Behavior is cooperative.      UC Treatments / Results  Labs (all labs ordered are listed, but only abnormal results are displayed) Labs Reviewed  POCT URINALYSIS DIP (MANUAL ENTRY) - Abnormal; Notable for the following components:      Result Value   Glucose, UA >=1,000 (*)    Ketones, POC UA small (15) (*)    Protein Ur, POC =30 (*)    All other components within normal limits  CERVICOVAGINAL ANCILLARY ONLY    EKG   Radiology No results found.  Procedures Procedures (including critical care time)  Medications Ordered in UC Medications - No data to display  Initial Impression / Assessment  and Plan / UC Course  I have reviewed the triage vital signs and the nursing notes.  Pertinent labs & imaging results that were available during my care of the patient were reviewed by me and considered in my medical decision making (see chart for details).    Urinalysis does not show signs of acute infection today; she is having symptoms consistent with a yeast infection.  Will obtain  self swab and check for gonorrhea, chlamydia, trichomonas, yeast vaginitis, bacterial vaginosis.  We will treat today with fluconazole 1 tablet, repeat in 3 days if symptoms persist.  Patient declines HIV and syphilis testing today.  Follow-up with primary care provider if symptoms persist despite treatment,   We also discussed elevated blood pressure today.  Patient reports her blood pressure is much improved compared to what it was, I recommended she follow-up with primary care provider as soon as able to discuss diabetes and hypertension regimen. Final Clinical Impressions(s) / UC Diagnoses   Final diagnoses:  Vaginal itching     Discharge Instructions      - The urine sample today does not show any signs of urinary infection -We are testing the vaginal swab for yeast vaginitis, bacterial vaginosis, gonorrhea, chlamydia, and trichomonas.  We will let you know with positive results when it comes back.  In the meantime, please take the fluconazole 1 tablet and repeat in 3 days if your symptoms persist -Please follow-up with your primary care provider as scheduled to discuss regimen for diabetes   ED Prescriptions     Medication Sig Dispense Auth. Provider   fluconazole (DIFLUCAN) 150 MG tablet Take 1 tablet (150 mg total) by mouth once for 1 dose. Repeat in 3 days if symptoms persist 2 tablet Eulogio Bear, NP      PDMP not reviewed this encounter.   Eulogio Bear, NP 03/01/22 1208

## 2022-03-01 NOTE — ED Triage Notes (Signed)
Vaginal itching and burning on urination since Tuesday with some back pain

## 2022-03-01 NOTE — Discharge Instructions (Addendum)
-   The urine sample today does not show any signs of urinary infection -We are testing the vaginal swab for yeast vaginitis, bacterial vaginosis, gonorrhea, chlamydia, and trichomonas.  We will let you know with positive results when it comes back.  In the meantime, please take the fluconazole 1 tablet and repeat in 3 days if your symptoms persist -Please follow-up with your primary care provider as scheduled to discuss regimen for diabetes

## 2022-03-04 ENCOUNTER — Telehealth (HOSPITAL_COMMUNITY): Payer: Self-pay | Admitting: Emergency Medicine

## 2022-03-04 LAB — CERVICOVAGINAL ANCILLARY ONLY
Bacterial Vaginitis (gardnerella): POSITIVE — AB
Candida Glabrata: POSITIVE — AB
Candida Vaginitis: POSITIVE — AB
Chlamydia: NEGATIVE
Comment: NEGATIVE
Comment: NEGATIVE
Comment: NEGATIVE
Comment: NEGATIVE
Comment: NEGATIVE
Comment: NORMAL
Neisseria Gonorrhea: NEGATIVE
Trichomonas: NEGATIVE

## 2022-03-04 MED ORDER — METRONIDAZOLE 500 MG PO TABS: 500.0000 mg | ORAL_TABLET | Freq: Two times a day (BID) | ORAL | 0 refills | Status: DC

## 2022-03-15 DIAGNOSIS — Z3200 Encounter for pregnancy test, result unknown: Secondary | ICD-10-CM | POA: Diagnosis not present

## 2022-03-15 DIAGNOSIS — R300 Dysuria: Secondary | ICD-10-CM | POA: Diagnosis not present

## 2022-03-15 DIAGNOSIS — N183 Chronic kidney disease, stage 3 unspecified: Secondary | ICD-10-CM | POA: Diagnosis not present

## 2022-03-15 DIAGNOSIS — I10 Essential (primary) hypertension: Secondary | ICD-10-CM | POA: Diagnosis not present

## 2022-03-15 DIAGNOSIS — E1122 Type 2 diabetes mellitus with diabetic chronic kidney disease: Secondary | ICD-10-CM | POA: Diagnosis not present

## 2022-03-15 DIAGNOSIS — Z6841 Body Mass Index (BMI) 40.0 and over, adult: Secondary | ICD-10-CM | POA: Diagnosis not present

## 2022-03-15 DIAGNOSIS — Z79899 Other long term (current) drug therapy: Secondary | ICD-10-CM | POA: Diagnosis not present

## 2022-03-15 DIAGNOSIS — E1143 Type 2 diabetes mellitus with diabetic autonomic (poly)neuropathy: Secondary | ICD-10-CM | POA: Diagnosis not present

## 2022-03-15 DIAGNOSIS — Z0130 Encounter for examination of blood pressure without abnormal findings: Secondary | ICD-10-CM | POA: Diagnosis not present

## 2022-03-15 DIAGNOSIS — M539 Dorsopathy, unspecified: Secondary | ICD-10-CM | POA: Diagnosis not present

## 2022-03-15 DIAGNOSIS — F319 Bipolar disorder, unspecified: Secondary | ICD-10-CM | POA: Diagnosis not present

## 2022-03-15 DIAGNOSIS — E7800 Pure hypercholesterolemia, unspecified: Secondary | ICD-10-CM | POA: Diagnosis not present

## 2022-03-17 IMAGING — DX DG CERVICAL SPINE COMPLETE 4+V
6 series · 6 of 6 positions shown · non-contrast
Comparison: None

CLINICAL DATA: Neck pain

EXAM:
CERVICAL SPINE - COMPLETE 4+ VIEW

[c-spine lat]
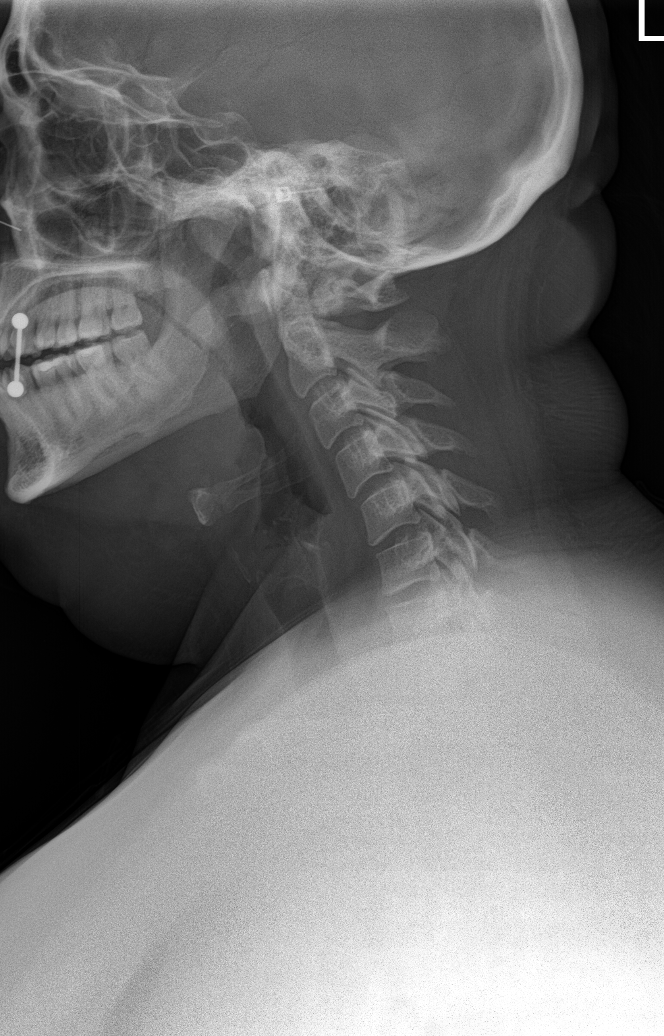

[c-spine obl (1 of 2)]
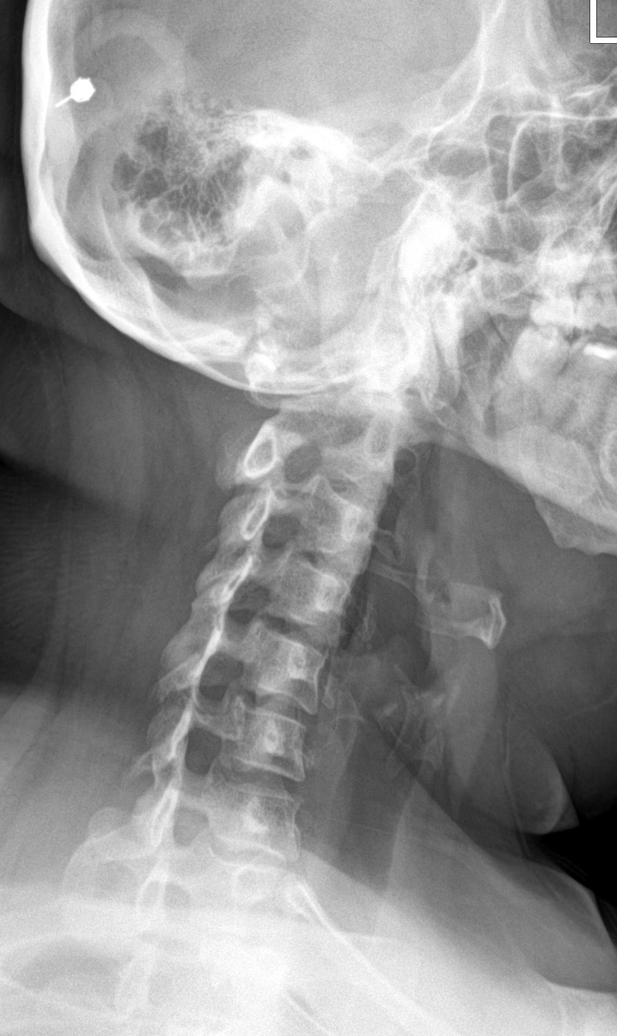

[c-spine obl (2 of 2)]
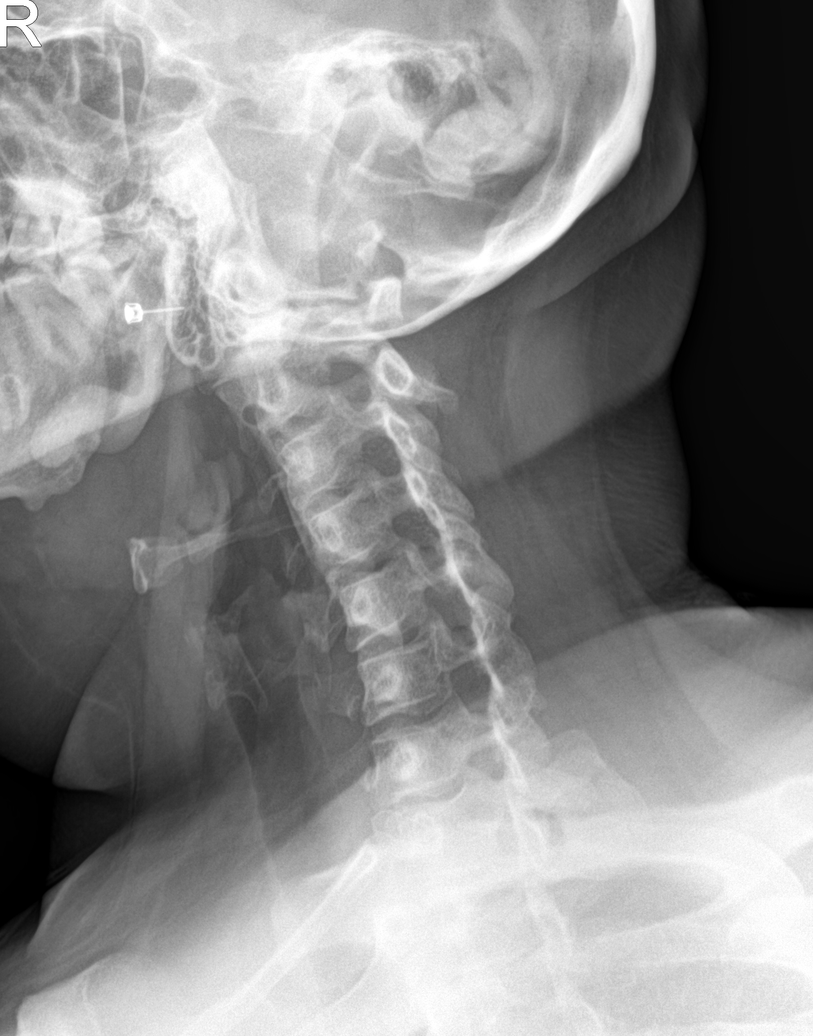

[c-spine ap]
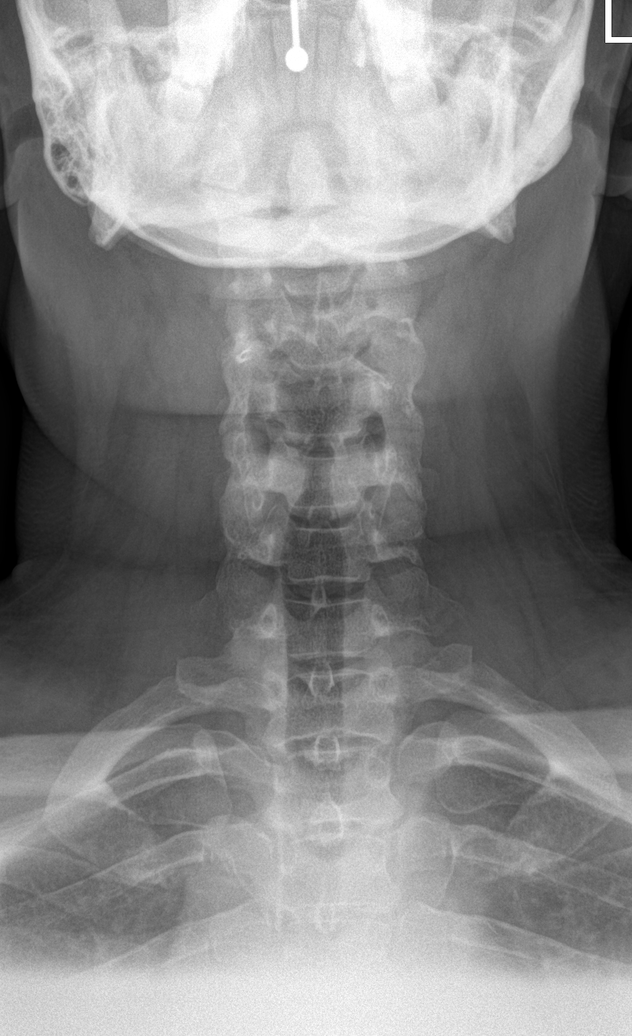

[c-spine open mouth]
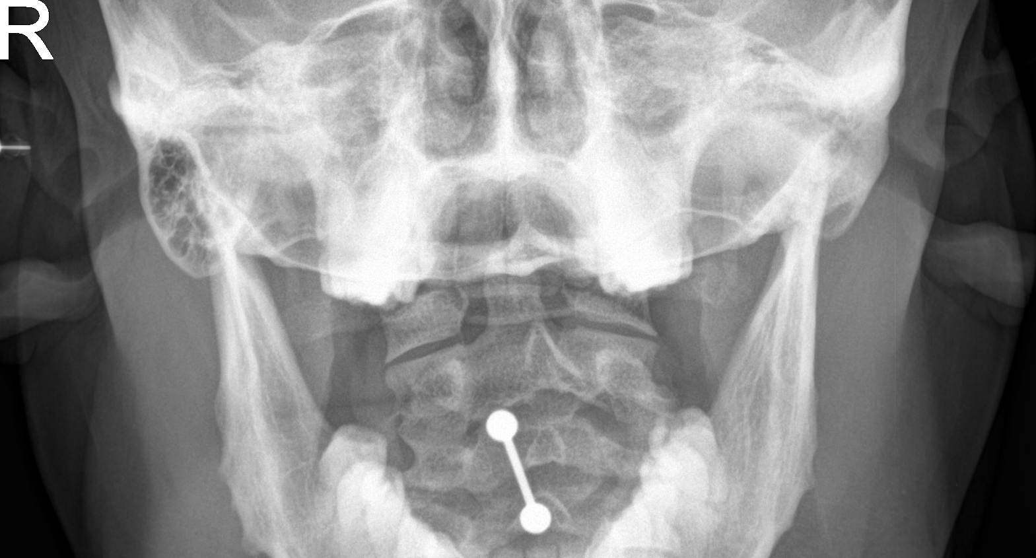

[t-spine lat swimmers]
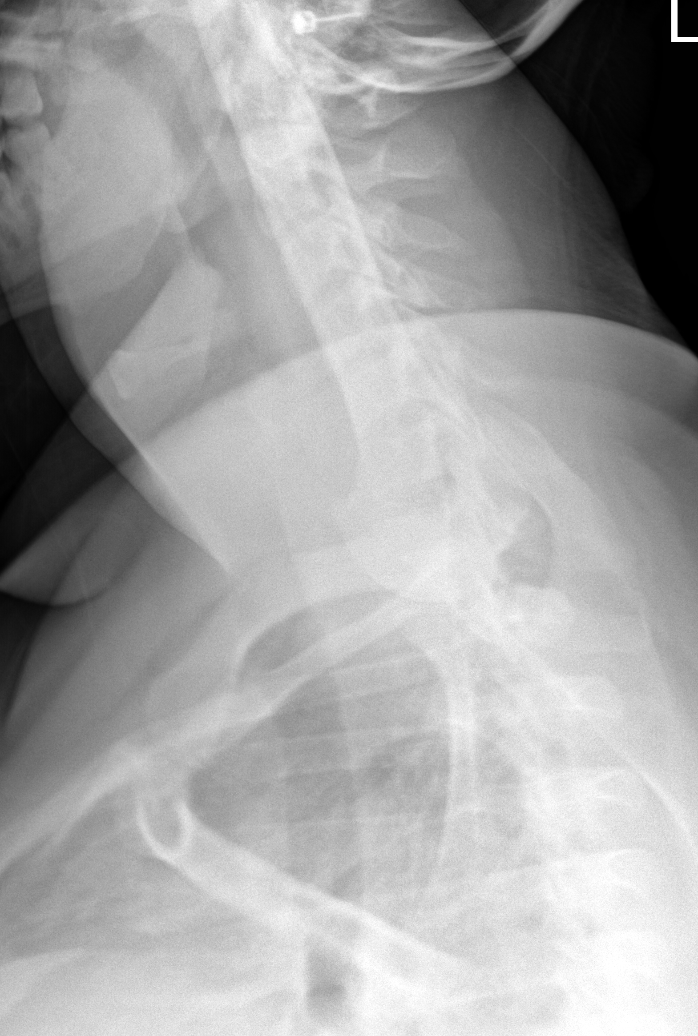

[6 of 6 positions shown; findings below may reference images not displayed]

FINDINGS: Reversal of cervical lordosis question muscle spasm.

Prevertebral soft tissues normal thickness.

Osseous mineralization normal.

Vertebral body and disc space heights maintained.

Bony foramina patent.

No fracture, subluxation, or bone destruction.

Tips of lung apices clear.
IMPRESSION: Question muscle spasm; otherwise negative exam.

## 2022-04-04 IMAGING — DX DG CHEST 2V
2 series · 2 of 2 positions shown · non-contrast
Comparison: Chest x-ray 05/21/2019, CT chest 01/28/2017

CLINICAL DATA: Cough, shortness of breath.  Hypoxia

EXAM:
CHEST - 2 VIEW

[chest pa]
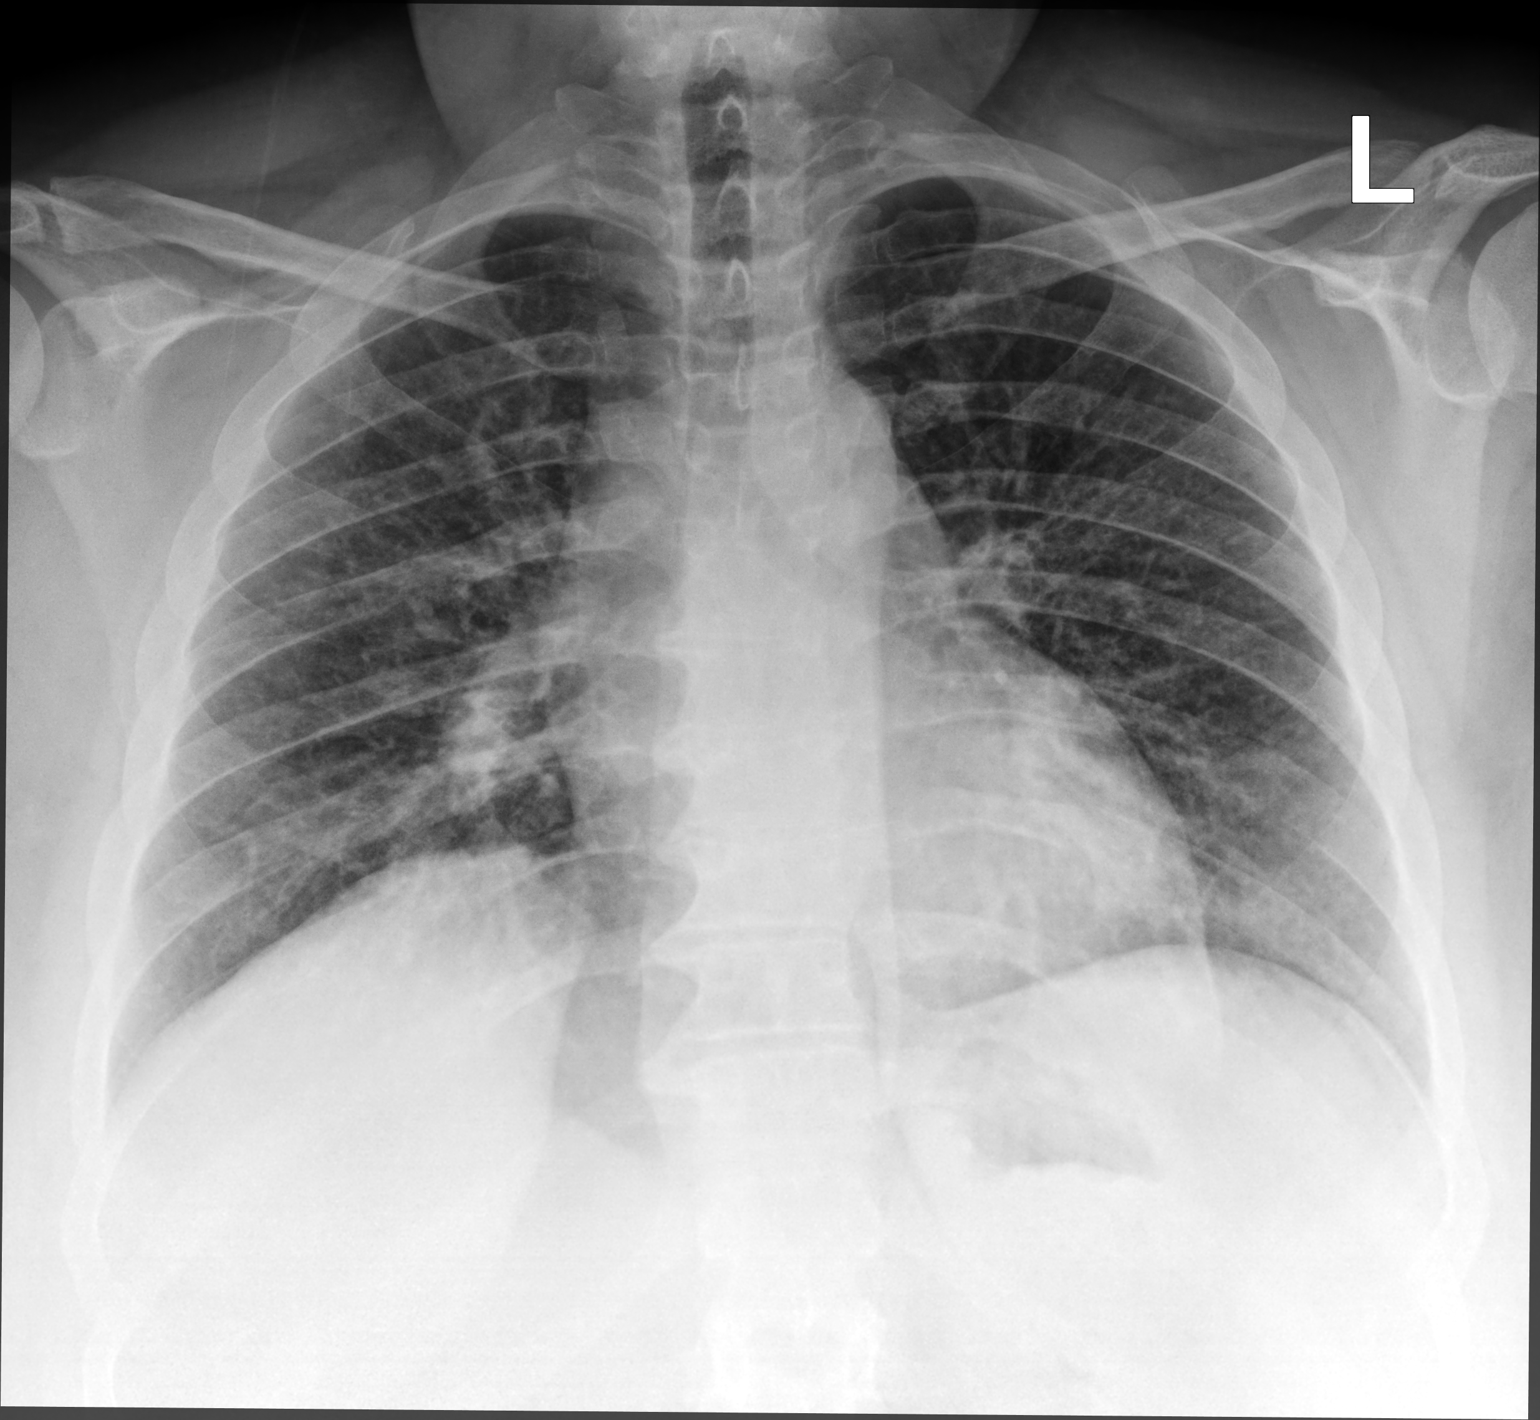

[chest lat]
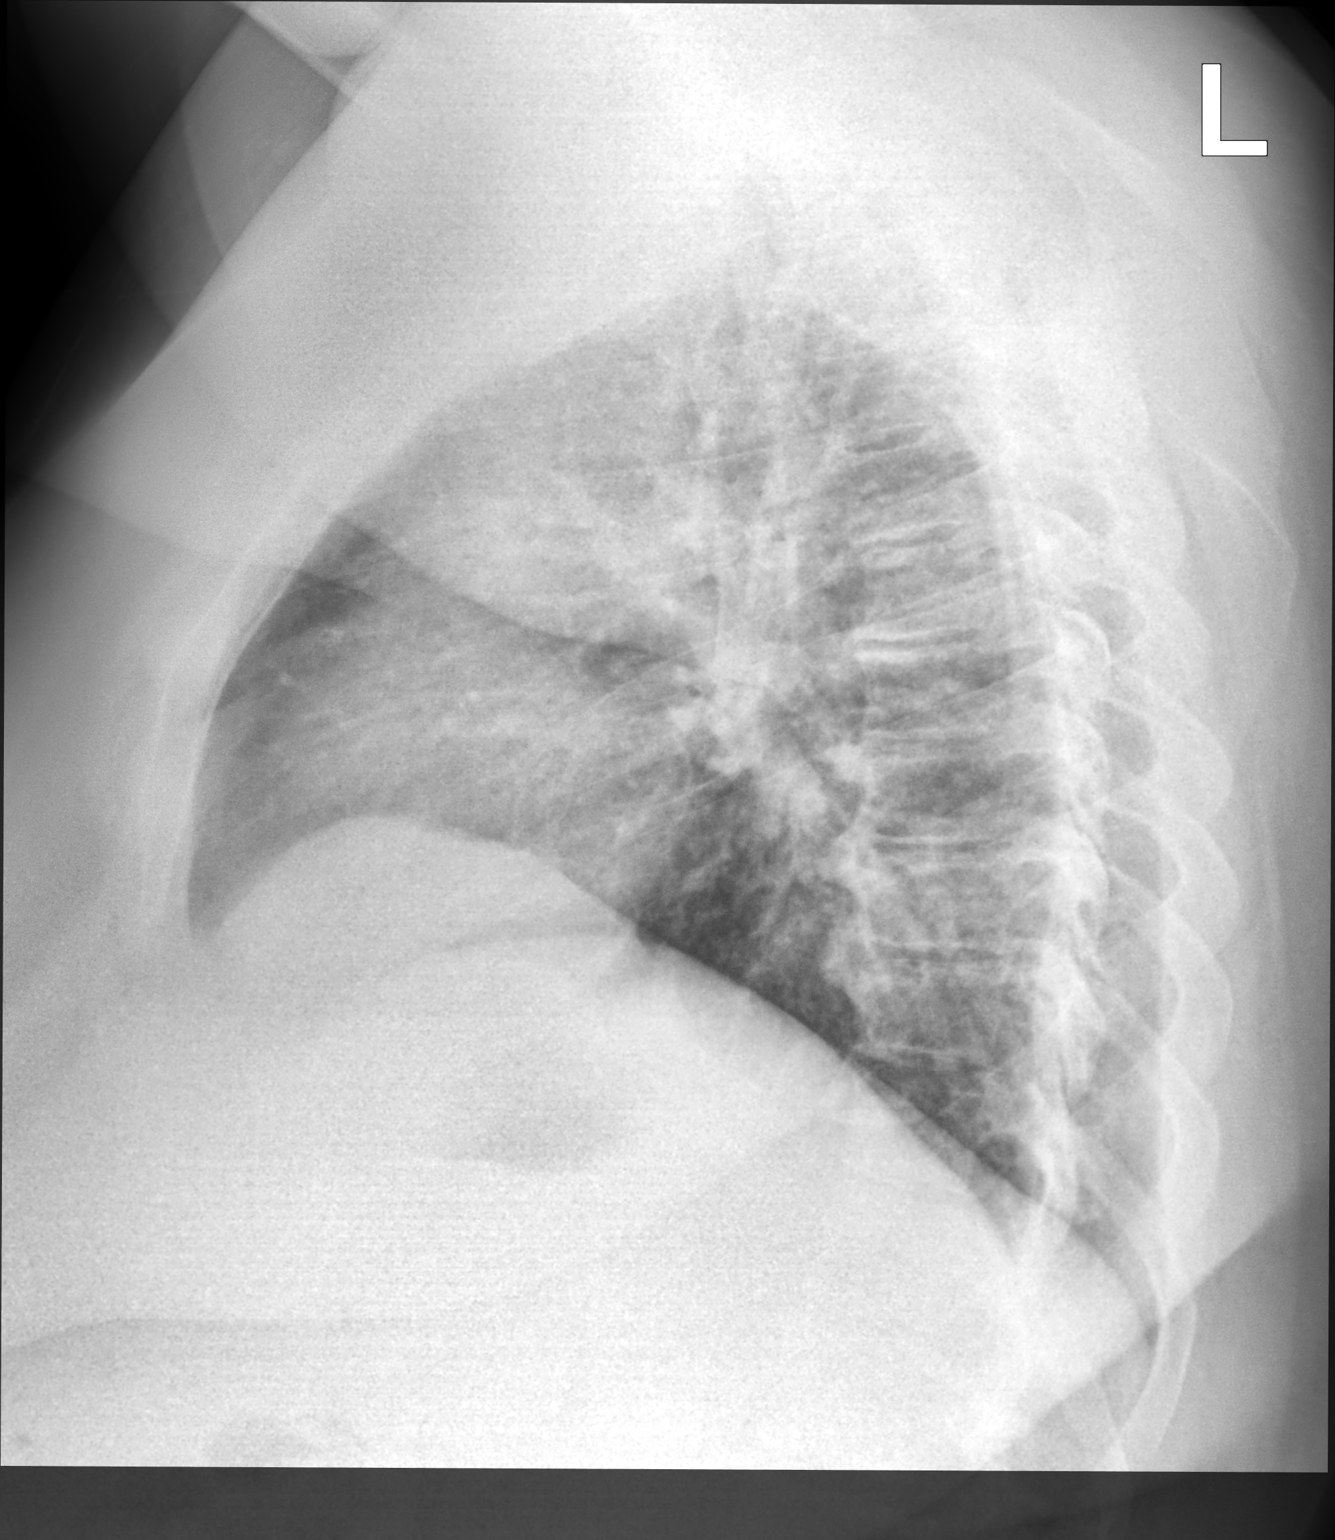

[2 of 2 positions shown; findings below may reference images not displayed]

FINDINGS: The heart and mediastinal contours are unchanged.

Low lung volumes. No focal consolidation. No pulmonary edema. No
pleural effusion. No pneumothorax.

No acute osseous abnormality.
IMPRESSION: No active cardiopulmonary disease.

## 2022-04-13 DIAGNOSIS — Z79899 Other long term (current) drug therapy: Secondary | ICD-10-CM | POA: Diagnosis not present

## 2022-04-21 ENCOUNTER — Ambulatory Visit
Admission: RE | Admit: 2022-04-21 | Discharge: 2022-04-21 | Disposition: A | Discharge status: Home / Self Care | Payer: Medicaid Other | Source: Ambulatory Visit

## 2022-04-21 VITALS — BP 140/89 | HR 104 | Temp 98.7°F | Resp 18

## 2022-04-21 DIAGNOSIS — H6981 Other specified disorders of Eustachian tube, right ear: Secondary | ICD-10-CM | POA: Diagnosis not present

## 2022-04-21 DIAGNOSIS — J309 Allergic rhinitis, unspecified: Secondary | ICD-10-CM | POA: Diagnosis not present

## 2022-04-21 MED ORDER — OFLOXACIN 0.3 % OT SOLN: 5.0000 [drp] | Freq: Two times a day (BID) | OTIC | 0 refills | Status: AC

## 2022-04-21 MED ORDER — FLUTICASONE PROPIONATE 50 MCG/ACT NA SUSP: 2.0000 | Freq: Every day | NASAL | 0 refills | Status: DC

## 2022-04-21 NOTE — ED Triage Notes (Signed)
Pt reports headache, right ear pain goes to right sided neck x 1 week. Sudafed, Excedrin and Claritin gives no relief.

## 2022-04-21 NOTE — Discharge Instructions (Signed)
Take medication as prescribed. May take Tylenol for pain, fever, or general discomfort. Continue your current allergy regimen at this time. Warm compresses to the affected ear help with comfort. Do not stick anything inside the ear while symptoms persist. Avoid getting water inside of the ear while symptoms persist. As discussed, if your symptoms fail to improve, recommend following up with your primary care physician or ear nose and throat for further evaluation.

## 2022-04-21 NOTE — ED Provider Notes (Signed)
[facility]    CSN: [id number] Arrival date & time: 04/21/22  0800      History   Chief Complaint Chief Complaint  Patient presents with   Headache    Ear pain,headache and neck pain - Entered by patient   Otalgia   Neck Pain    HPI Patricia Stanton is a 36 y.o. female.   The history is provided by the patient.   Patient presents with a 1 week history of right ear pain, headache, and neck pain.  Patient states pain starts in the right ear and radiates into the right neck behind her ear and into the right side of her head.  Patient reports history of chronic migraines.  She also complains of chills, nasal congestion and runny nose.  She denies fever, ear drainage, cough, wheezing, shortness of breath, or GI symptoms.  Patient states she has taken Excedrin and the various forms and that has not appeared to help her headache.  She has also been taking Sudafed and she takes Claritin daily for allergies.  Patient reports she has a history of diabetes.  Past Medical History:  Diagnosis Date   Adrenal tumor    Anxiety    Arthritis    left knee   Asthma    Chronic abdominal pain    Chronic headaches    Depression    Diabetes mellitus type 2 in obese ([place]) 11/20/2016   Endometriosis    Gastroesophageal reflux disease 11/09/2020   HLD (hyperlipidemia) 02/17/2017   Hypertension    Hypertension 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 ([place]) 03/25/2019   Mood disorder ([place])    Obesity    Ovarian cyst    Tobacco abuse     Patient Active Problem List   Diagnosis Date Noted   Abnormal iron saturation 08/06/2021   Chronic kidney disease, stage 3a ([place]) 07/31/2021   Upper respiratory infection with cough and congestion 07/17/2021   Elevated ALT measurement 01/04/2021   Gastroesophageal reflux disease 11/09/2020   S/P lumbar laminectomy 05/21/2019   Simple chronic bronchitis ([place]) 03/25/2019   Hypertension associated with chronic kidney disease  due to type 2 diabetes mellitus ([place]) 03/25/2019   Chronic abdominal pain 03/25/2019   Leukocytosis 11/19/2018   Noncompliance 06/03/2017   Hyperlipidemia associated with type 2 diabetes mellitus ([place]) 02/17/2017   Major depressive disorder, recurrent episode, moderate ([place]) 02/03/2017   Vitamin D deficiency 11/20/2016   Diabetes mellitus type 2 in obese ([place]) 11/20/2016   PTSD (post-traumatic stress disorder) 11/19/2016   History of hernia surgery 11/19/2016   Pulmonary nodule/lesion, solitary 11/19/2016   Mucosal abnormality of stomach    Non-intractable vomiting 12/19/2015   Constipation 12/19/2015   Tobacco abuse 06/06/2013   Morbid obesity ([place]) 06/06/2013    Past Surgical History:  Procedure Laterality Date   CESAREAN SECTION  2008   [place]   CHOLECYSTECTOMY N/A 11/25/2014   Procedure: LAPAROSCOPIC CHOLECYSTECTOMY;  Surgeon: Aviva Signs Md, MD;  Location: [facility];  Service: General;  Laterality: N/A;   ESOPHAGOGASTRODUODENOSCOPY N/A 12/21/2015   Dr. Gala Romney: normal esophagus, non-bleeding erosive gastropathy, normal second portion of duodenum. Reactive gastropathy/chemical gastritis, negative H.pylori   ESOPHAGOGASTRODUODENOSCOPY (EGD) WITH PROPOFOL N/A 11/23/2020    Surgeon: Daneil Dolin, MD; normal esophagus, small hiatal hernia, normal examined duodenum.   FEMUR FRACTURE SURGERY Left 1995   tree fell on her   [place] N/A 01/29/2016   Procedure: Fatima Blank  HERNIORRHAPHY WITH MESH;  Surgeon: Aviva Signs, MD;  Location: [facility];  Service: General;  Laterality: N/A;   INSERTION OF MESH  01/29/2016   Procedure: INSERTION OF MESH;  Surgeon: Aviva Signs, MD;  Location: [facility];  Service: General;;   KNEE ARTHROSCOPY Left    LUMBAR LAMINECTOMY/DECOMPRESSION MICRODISCECTOMY Right 05/21/2019   Procedure: Microdiscectomy - Lumbar four-Lumbar five - right;  Surgeon: Eustace Moore, MD;  Location: [place];  Service: Neurosurgery;  Laterality: Right;   TUBAL  LIGATION      OB History   No obstetric history on file.      Home Medications    Prior to Admission medications   Medication Sig Start Date End Date Taking? Authorizing Provider  aspirin-acetaminophen-caffeine (EXCEDRIN MIGRAINE) [phone] MG tablet Take by mouth every 6 (six) hours as needed for headache.   Yes [provider]  fluticasone (FLONASE) 50 MCG/ACT nasal spray Place 2 sprays into both nostrils daily. 04/21/22  Yes Mykia Holton-Warren, Alda Lea, NP  ofloxacin (FLOXIN) 0.3 % OTIC solution Place 5 drops into the right ear 2 (two) times daily for 7 days. 04/21/22 04/28/22 Yes Vinita Prentiss-Warren, Alda Lea, NP  pseudoephedrine (SUDAFED) 60 MG tablet Take 60 mg by mouth every 4 (four) hours as needed for congestion.   Yes [provider]  albuterol (PROVENTIL) (2.5 MG/3ML) 0.083% nebulizer solution Take 3 mLs (2.5 mg total) by nebulization every 6 (six) hours as needed for wheezing or shortness of breath. 06/25/21   Sharion Balloon, FNP  albuterol (VENTOLIN HFA) 108 (90 Base) MCG/ACT inhaler Inhale 2 puffs into the lungs every 6 (six) hours as needed for wheezing or shortness of breath. 07/17/21   Ivy Lynn, NP  budesonide-formoterol (SYMBICORT) 80-4.5 MCG/ACT inhaler Inhale 2 puffs into the lungs 2 (two) times daily. 07/17/21   Ivy Lynn, NP  cetirizine (ZYRTEC) 10 MG tablet Take 1 tablet (10 mg total) by mouth daily. 06/08/20   Evelina Dun A, FNP  escitalopram (LEXAPRO) 20 MG tablet Take 1 tablet (20 mg total) by mouth daily. 06/20/21 09/18/21  Norman Clay, MD  gabapentin (NEURONTIN) 600 MG tablet Take 600 mg by mouth 3 (three) times daily. 11/05/20   [provider]  linaclotide Rolan Lipa) 72 MCG capsule Take 1 capsule (72 mcg total) by mouth daily before breakfast. 08/06/21   Erenest Rasher, PA-C  lisinopril-hydrochlorothiazide (ZESTORETIC) 20-25 MG tablet Take 1 tablet by mouth daily. 11/29/20   Ivy Lynn, NP  lovastatin (MEVACOR) 20 MG tablet  TAKE 1 TABLET(20 MG) BY MOUTH AT BEDTIME 09/03/21   Hawks, Alyse Low A, FNP  methocarbamol (ROBAXIN) 500 MG tablet Take 1 tablet (500 mg total) by mouth 3 (three) times daily as needed for muscle spasms. 06/28/21   Sharion Balloon, FNP  metroNIDAZOLE (FLAGYL) 500 MG tablet Take 1 tablet (500 mg total) by mouth 2 (two) times daily. 03/04/22   Lamptey, Myrene Galas, MD  oxyCODONE-acetaminophen (PERCOCET/ROXICET) 5-325 MG tablet Take 1 tablet by mouth every 4 (four) hours as needed for moderate pain. 05/21/19   Eustace Moore, MD  pantoprazole (PROTONIX) 40 MG tablet Take 1 tablet (40 mg total) by mouth 2 (two) times daily. 08/06/21   Erenest Rasher, PA-C  prazosin (MINIPRESS) 1 MG capsule Take 3 capsules (3 mg total) by mouth at bedtime. 06/20/21 09/18/21  Norman Clay, MD  RYBELSUS 7 MG TABS TAKE 1 TABLET BY MOUTH DAILY 09/24/21   Sharion Balloon, FNP    Family History Family History  Problem Relation Age of Onset   Hypertension Mother    Hyperlipidemia Mother    Fibromyalgia Mother    Other Mother        degenerative disc disease   Arthritis Mother    Kidney disease Mother    Diabetes Father    Hypertension Father    Hyperlipidemia Father    Heart disease Father 80   Heart attack Father    Stroke Father    Hyperlipidemia Brother    Hypertension Brother    Aneurysm Maternal Grandmother        AAA   Kidney disease Maternal Grandmother    Kidney disease Maternal Grandfather    Aneurysm Paternal Grandmother        AAA   Kidney disease Paternal Grandmother    Kidney disease Paternal Grandfather    Colon cancer Neg Hx    Inflammatory bowel disease Neg Hx     Social History Social History   Tobacco Use   Smoking status: Every Day    Packs/day: 0.50    Years: 11.00    Total pack years: 5.50    Types: Cigarettes    Start date: 09/17/2003   Smokeless tobacco: Never  Vaping Use   Vaping Use: Never used  Substance Use Topics   Alcohol use: No   Drug use: No     Allergies    Tramadol   Review of Systems Review of Systems Per HPI  Physical Exam Triage Vital Signs ED Triage Vitals  Enc Vitals Group     BP 04/21/22 0809 (!) 140/89     Pulse Rate 04/21/22 0809 (!) 104     Resp 04/21/22 0809 18     Temp 04/21/22 0809 98.7 F (37.1 C)     Temp Source 04/21/22 0809 Oral     SpO2 04/21/22 0809 96 %     Weight --      Height --      Head Circumference --      Peak Flow --      Pain Score 04/21/22 0811 6     Pain Loc --      Pain Edu? --      Excl. in [place]? --    No data found.  Updated Vital Signs BP (!) 140/89 (BP Location: Right Arm)   Pulse (!) 104   Temp 98.7 F (37.1 C) (Oral)   Resp 18   LMP  (Within Weeks) Comment: 3 weeks  SpO2 96%   Visual Acuity Right Eye Distance:   Left Eye Distance:   Bilateral Distance:    Right Eye Near:   Left Eye Near:    Bilateral Near:     Physical Exam Vitals and nursing note reviewed.  Constitutional:      General: She is not in acute distress.    Appearance: She is well-developed.  HENT:     Head: Normocephalic.     Right Ear: Hearing and external ear normal. A middle ear effusion is present.     Left Ear: Hearing, tympanic membrane, ear canal and external ear normal.     Mouth/Throat:     Mouth: Mucous membranes are moist.  Eyes:     Extraocular Movements: Extraocular movements intact.     Pupils: Pupils are equal, round, and reactive to light.  Cardiovascular:     Rate and Rhythm: Regular rhythm. Tachycardia present.  Pulmonary:     Effort: Pulmonary effort is normal.  Abdominal:     General: Bowel  sounds are normal.     Palpations: Abdomen is soft.  Musculoskeletal:     Cervical back: Normal range of motion and neck supple. No rigidity.  Lymphadenopathy:     Cervical: No cervical adenopathy.  Skin:    General: Skin is warm and dry.  Neurological:     Mental Status: She is alert.     GCS: GCS eye subscore is 4. GCS verbal subscore is 5. GCS motor subscore is 6.     Cranial  Nerves: No cranial nerve deficit.     Sensory: No sensory deficit.  Psychiatric:        Mood and Affect: Mood normal.        Speech: Speech normal.        Behavior: Behavior normal.      UC Treatments / Results  Labs (all labs ordered are listed, but only abnormal results are displayed) Labs Reviewed - No data to display  EKG   Radiology No results found.  Procedures Procedures (including critical care time)  Medications Ordered in UC Medications - No data to display  Initial Impression / Assessment and Plan / UC Course  I have reviewed the triage vital signs and the nursing notes.  Pertinent labs & imaging results that were available during my care of the patient were reviewed by me and considered in my medical decision making (see chart for details).  Patient presents for complaints of right ear pain, headache, and right-sided neck pain.  Symptoms have been present for the past week.  On exam, patient has a right middle ear effusion.  There is no edema of her TM.  She does have some tenderness to her pinna and tragus.  Discussion with patient that we will start her on Floxin eardrops for her symptoms at this time.  We will also prescribe fluticasone to see if this helps with her ear effusion.  We will forego use of prednisone as patient is a diabetic.  Supportive care recommendations were provided to the patient.  Patient advised that if her symptoms do not improve, she will need to follow-up with her primary care physician or ear nose and throat. Final Clinical Impressions(s) / UC Diagnoses   Final diagnoses:  Acute dysfunction of right eustachian tube  Allergic rhinitis, unspecified seasonality, unspecified trigger     Discharge Instructions      Take medication as prescribed. May take Tylenol for pain, fever, or general discomfort. Continue your current allergy regimen at this time. Warm compresses to the affected ear help with comfort. Do not stick anything  inside the ear while symptoms persist. Avoid getting water inside of the ear while symptoms persist. As discussed, if your symptoms fail to improve, recommend following up with your primary care physician or ear nose and throat for further evaluation.      ED Prescriptions     Medication Sig Dispense Auth. Provider   ofloxacin (FLOXIN) 0.3 % OTIC solution Place 5 drops into the right ear 2 (two) times daily for 7 days. 5 mL Aleea Hendry-Warren, Alda Lea, NP   fluticasone (FLONASE) 50 MCG/ACT nasal spray Place 2 sprays into both nostrils daily. 16 g Cecia Egge-Warren, Alda Lea, NP      PDMP not reviewed this encounter.   Tish Men, NP 04/21/22 [phone]

## 2022-04-29 ENCOUNTER — Ambulatory Visit: Payer: Medicaid Other | Admitting: Psychiatry

## 2022-05-11 DIAGNOSIS — B9689 Other specified bacterial agents as the cause of diseases classified elsewhere: Secondary | ICD-10-CM | POA: Diagnosis not present

## 2022-05-11 DIAGNOSIS — N898 Other specified noninflammatory disorders of vagina: Secondary | ICD-10-CM | POA: Diagnosis not present

## 2022-05-11 DIAGNOSIS — Z0130 Encounter for examination of blood pressure without abnormal findings: Secondary | ICD-10-CM | POA: Diagnosis not present

## 2022-05-11 DIAGNOSIS — R300 Dysuria: Secondary | ICD-10-CM | POA: Diagnosis not present

## 2022-05-11 DIAGNOSIS — Z6841 Body Mass Index (BMI) 40.0 and over, adult: Secondary | ICD-10-CM | POA: Diagnosis not present

## 2022-05-11 DIAGNOSIS — E7800 Pure hypercholesterolemia, unspecified: Secondary | ICD-10-CM | POA: Diagnosis not present

## 2022-05-11 DIAGNOSIS — I10 Essential (primary) hypertension: Secondary | ICD-10-CM | POA: Diagnosis not present

## 2022-05-11 DIAGNOSIS — N760 Acute vaginitis: Secondary | ICD-10-CM | POA: Diagnosis not present

## 2022-05-11 DIAGNOSIS — E1143 Type 2 diabetes mellitus with diabetic autonomic (poly)neuropathy: Secondary | ICD-10-CM | POA: Diagnosis not present

## 2022-05-11 DIAGNOSIS — N183 Chronic kidney disease, stage 3 unspecified: Secondary | ICD-10-CM | POA: Diagnosis not present

## 2022-05-11 DIAGNOSIS — Z3200 Encounter for pregnancy test, result unknown: Secondary | ICD-10-CM | POA: Diagnosis not present

## 2022-05-11 DIAGNOSIS — Z79899 Other long term (current) drug therapy: Secondary | ICD-10-CM | POA: Diagnosis not present

## 2022-05-11 DIAGNOSIS — E1122 Type 2 diabetes mellitus with diabetic chronic kidney disease: Secondary | ICD-10-CM | POA: Diagnosis not present

## 2022-09-03 DIAGNOSIS — Z79899 Other long term (current) drug therapy: Secondary | ICD-10-CM | POA: Diagnosis not present

## 2022-09-06 ENCOUNTER — Ambulatory Visit
Admission: EM | Admit: 2022-09-06 | Discharge: 2022-09-06 | Disposition: A | Discharge status: Home / Self Care | Payer: Medicaid Other | Attending: Nurse Practitioner | Admitting: Nurse Practitioner

## 2022-09-06 DIAGNOSIS — N898 Other specified noninflammatory disorders of vagina: Secondary | ICD-10-CM

## 2022-09-06 LAB — POCT URINALYSIS DIP (MANUAL ENTRY)
Bilirubin, UA: NEGATIVE
Glucose, UA: NEGATIVE mg/dL
Nitrite, UA: NEGATIVE
Protein Ur, POC: 30 mg/dL — AB
Spec Grav, UA: >=1.03 — AB (ref 1.010–1.025)
Urobilinogen, UA: 0.2 E.U./dL
pH, UA: 6 (ref 5.0–8.0)

## 2022-09-06 MED ORDER — CEPHALEXIN 500 MG PO CAPS: 500.0000 mg | ORAL_CAPSULE | Freq: Two times a day (BID) | ORAL | 0 refills | Status: AC

## 2022-09-06 MED ORDER — FLUCONAZOLE 150 MG PO TABS: 150.0000 mg | ORAL_TABLET | Freq: Every day | ORAL | 0 refills | Status: DC

## 2022-09-06 NOTE — Discharge Instructions (Addendum)
The urinalysis suggest that you may have a urinary tract infection.  A urine culture is pending.  You will be contacted if the urine ultra is negative and asked to stop the antibiotic. The cytology swab is also pending.  You will be contacted if the results of the swab are positive. Increase fluids.  Try to drink at least 8-10 8 ounce glasses of water or is much as you are able to drink while symptoms persist. Recommend eating yogurt daily to help improve your vaginal flora. As discussed, if symptoms fail to improve after this treatment, please follow-up with your primary care physician for further evaluation. Follow-up as needed.

## 2022-09-06 NOTE — ED Triage Notes (Signed)
Pt reports vaginal itching and burning when urinating x 3 days.

## 2022-09-06 NOTE — ED Provider Notes (Signed)
[facility]    CSN: [id number] Arrival date & time: 09/06/22  0807      History   Chief Complaint Chief Complaint  Patient presents with   Dysuria         HPI Patricia Stanton is a 36 y.o. female.   The history is provided by the patient.   The patient presents for complaints of vaginal itching and vaginal irritation that been present for the past several days.  Patient states symptoms started initially with vaginal discharge.  She states that she tried drinking more water and cranberry juice for her symptoms.  She states her symptoms progressed to the vaginal itching and irritation.  She denies vaginal odor, abdominal pain, a change in urinary frequency, urgency, or hesitancy.  Also denies any recent antibiotic use.  Denies concern for STI or STD, states that she has 1 female partner.  Last menstrual cycle was on 08/16/2022.  Patient reports that she has a history of chronic kidney disease.  Past Medical History:  Diagnosis Date   Adrenal tumor    Anxiety    Arthritis    left knee   Asthma    Chronic abdominal pain    Chronic headaches    Depression    Diabetes mellitus type 2 in obese ([place]) 11/20/2016   Endometriosis    Gastroesophageal reflux disease 11/09/2020   HLD (hyperlipidemia) 02/17/2017   Hypertension    Hypertension 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 ([place]) 03/25/2019   Mood disorder ([place])    Obesity    Ovarian cyst    Tobacco abuse     Patient Active Problem List   Diagnosis Date Noted   Abnormal iron saturation 08/06/2021   Chronic kidney disease, stage 3a ([place]) 07/31/2021   Upper respiratory infection with cough and congestion 07/17/2021   Elevated ALT measurement 01/04/2021   Gastroesophageal reflux disease 11/09/2020   S/P lumbar laminectomy 05/21/2019   Simple chronic bronchitis ([place]) 03/25/2019   Hypertension 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 ([place]) 03/25/2019    Chronic abdominal pain 03/25/2019   Leukocytosis 11/19/2018   Noncompliance 06/03/2017   Hyperlipidemia associated with type 2 diabetes mellitus ([place]) 02/17/2017   Major depressive disorder, recurrent episode, moderate ([place]) 02/03/2017   Vitamin D deficiency 11/20/2016   Diabetes mellitus type 2 in obese ([place]) 11/20/2016   PTSD (post-traumatic stress disorder) 11/19/2016   History of hernia surgery 11/19/2016   Pulmonary nodule/lesion, solitary 11/19/2016   Mucosal abnormality of stomach    Non-intractable vomiting 12/19/2015   Constipation 12/19/2015   Tobacco abuse 06/06/2013   Morbid obesity ([place]) 06/06/2013    Past Surgical History:  Procedure Laterality Date   CESAREAN SECTION  2008   [place]   CHOLECYSTECTOMY N/A 11/25/2014   Procedure: LAPAROSCOPIC CHOLECYSTECTOMY;  Surgeon: Aviva Signs Md, MD;  Location: [facility];  Service: General;  Laterality: N/A;   ESOPHAGOGASTRODUODENOSCOPY N/A 12/21/2015   Dr. Gala Romney: normal esophagus, non-bleeding erosive gastropathy, normal second portion of duodenum. Reactive gastropathy/chemical gastritis, negative H.pylori   ESOPHAGOGASTRODUODENOSCOPY (EGD) WITH PROPOFOL N/A 11/23/2020    Surgeon: Daneil Dolin, MD; normal esophagus, small hiatal hernia, normal examined duodenum.   FEMUR FRACTURE SURGERY Left 1995   tree fell on her   Toast N/A 01/29/2016   Procedure: Fatima Blank HERNIORRHAPHY WITH MESH;  Surgeon: Aviva Signs, MD;  Location: [facility];  Service: General;  Laterality: N/A;  INSERTION OF MESH  01/29/2016   Procedure: INSERTION OF MESH;  Surgeon: Aviva Signs, MD;  Location: [facility];  Service: General;;   KNEE ARTHROSCOPY Left    LUMBAR LAMINECTOMY/DECOMPRESSION MICRODISCECTOMY Right 05/21/2019   Procedure: Microdiscectomy - Lumbar four-Lumbar five - right;  Surgeon: Eustace Moore, MD;  Location: [facility];  Service: Neurosurgery;  Laterality: Right;   TUBAL LIGATION      OB History   No obstetric history on  file.      Home Medications    Prior to Admission medications   Medication Sig Start Date End Date Taking? Authorizing Provider  cephALEXin (KEFLEX) 500 MG capsule Take 1 capsule (500 mg total) by mouth 2 (two) times daily for 7 days. 09/06/22 09/13/22 Yes Erdem Naas-Warren, Alda Lea, NP  fluconazole (DIFLUCAN) 150 MG tablet Take 1 tablet (150 mg total) by mouth daily. Take 1 pill today by mouth.  May repeat every 72 hours for up to 2 additional doses if symptoms persist. 09/06/22  Yes Zayquan Bogard-Warren, Alda Lea, NP  Semaglutide (OZEMPIC, 2 MG/DOSE, [place]) Inject into the skin.   Yes [provider]  albuterol (PROVENTIL) (2.5 MG/3ML) 0.083% nebulizer solution Take 3 mLs (2.5 mg total) by nebulization every 6 (six) hours as needed for wheezing or shortness of breath. 06/25/21   Sharion Balloon, FNP  albuterol (VENTOLIN HFA) 108 (90 Base) MCG/ACT inhaler Inhale 2 puffs into the lungs every 6 (six) hours as needed for wheezing or shortness of breath. 07/17/21   Ivy Lynn, NP  aspirin-acetaminophen-caffeine (EXCEDRIN MIGRAINE) [id number] MG tablet Take by mouth every 6 (six) hours as needed for headache.    [provider]  budesonide-formoterol (SYMBICORT) 80-4.5 MCG/ACT inhaler Inhale 2 puffs into the lungs 2 (two) times daily. 07/17/21   Ivy Lynn, NP  cetirizine (ZYRTEC) 10 MG tablet Take 1 tablet (10 mg total) by mouth daily. 06/08/20   Evelina Dun A, FNP  escitalopram (LEXAPRO) 20 MG tablet Take 1 tablet (20 mg total) by mouth daily. 06/20/21 09/18/21  Norman Clay, MD  fluticasone (FLONASE) 50 MCG/ACT nasal spray Place 2 sprays into both nostrils daily. 04/21/22   Colleene Swarthout-Warren, Alda Lea, NP  gabapentin (NEURONTIN) 600 MG tablet Take 600 mg by mouth 3 (three) times daily. 11/05/20   [provider]  linaclotide Rolan Lipa) 72 MCG capsule Take 1 capsule (72 mcg total) by mouth daily before breakfast. 08/06/21   Erenest Rasher, PA-C   lisinopril-hydrochlorothiazide (ZESTORETIC) 20-25 MG tablet Take 1 tablet by mouth daily. 11/29/20   Ivy Lynn, NP  lovastatin (MEVACOR) 20 MG tablet TAKE 1 TABLET(20 MG) BY MOUTH AT BEDTIME 09/03/21   Hawks, Alyse Low A, FNP  methocarbamol (ROBAXIN) 500 MG tablet Take 1 tablet (500 mg total) by mouth 3 (three) times daily as needed for muscle spasms. 06/28/21   Sharion Balloon, FNP  metroNIDAZOLE (FLAGYL) 500 MG tablet Take 1 tablet (500 mg total) by mouth 2 (two) times daily. 03/04/22   Lamptey, Myrene Galas, MD  oxyCODONE-acetaminophen (PERCOCET/ROXICET) 5-325 MG tablet Take 1 tablet by mouth every 4 (four) hours as needed for moderate pain. 05/21/19   Eustace Moore, MD  pantoprazole (PROTONIX) 40 MG tablet Take 1 tablet (40 mg total) by mouth 2 (two) times daily. 08/06/21   Erenest Rasher, PA-C  prazosin (MINIPRESS) 1 MG capsule Take 3 capsules (3 mg total) by mouth at bedtime. 06/20/21 09/18/21  Norman Clay, MD  pseudoephedrine (SUDAFED) 60 MG tablet Take 60 mg by mouth every  4 (four) hours as needed for congestion.    [provider]  RYBELSUS 7 MG TABS TAKE 1 TABLET BY MOUTH DAILY 09/24/21   Sharion Balloon, FNP    Family History Family History  Problem Relation Age of Onset   Hypertension Mother    Hyperlipidemia Mother    Fibromyalgia Mother    Other Mother        degenerative disc disease   Arthritis Mother    Kidney disease Mother    Diabetes Father    Hypertension Father    Hyperlipidemia Father    Heart disease Father 13   Heart attack Father    Stroke Father    Hyperlipidemia Brother    Hypertension Brother    Aneurysm Maternal Grandmother        AAA   Kidney disease Maternal Grandmother    Kidney disease Maternal Grandfather    Aneurysm Paternal Grandmother        AAA   Kidney disease Paternal Grandmother    Kidney disease Paternal Grandfather    Colon cancer Neg Hx    Inflammatory bowel disease Neg Hx     Social History Social History   Tobacco  Use   Smoking status: Every Day    Packs/day: 0.50    Years: 11.00    Total pack years: 5.50    Types: Cigarettes    Start date: 09/17/2003   Smokeless tobacco: Never  Vaping Use   Vaping Use: Never used  Substance Use Topics   Alcohol use: No   Drug use: No     Allergies   Tramadol   Review of Systems Review of Systems Per HPI  Physical Exam Triage Vital Signs ED Triage Vitals  Enc Vitals Group     BP 09/06/22 0846 (!) 164/92     Pulse Rate 09/06/22 0846 (!) 104     Resp 09/06/22 0846 18     Temp 09/06/22 0846 98.6 F (37 C)     Temp Source 09/06/22 0846 Oral     SpO2 09/06/22 0846 95 %     Weight --      Height --      Head Circumference --      Peak Flow --      Pain Score 09/06/22 0844 0     Pain Loc --      Pain Edu? --      Excl. in [place]? --    No data found.  Updated Vital Signs BP (!) 164/92 (BP Location: Right Arm)   Pulse (!) 104   Temp 98.6 F (37 C) (Oral)   Resp 18   LMP 08/16/2022 (Exact Date)   SpO2 95%   Visual Acuity Right Eye Distance:   Left Eye Distance:   Bilateral Distance:    Right Eye Near:   Left Eye Near:    Bilateral Near:     Physical Exam Vitals and nursing note reviewed.  Constitutional:      General: She is not in acute distress.    Appearance: Normal appearance.  HENT:     Head: Normocephalic.  Eyes:     Extraocular Movements: Extraocular movements intact.     Pupils: Pupils are equal, round, and reactive to light.  Cardiovascular:     Rate and Rhythm: Normal rate and regular rhythm.     Pulses: Normal pulses.     Heart sounds: Normal heart sounds.  Pulmonary:     Effort: Pulmonary effort is normal. No  respiratory distress.     Breath sounds: Normal breath sounds. No stridor. No wheezing, rhonchi or rales.  Abdominal:     General: Bowel sounds are normal.     Palpations: Abdomen is soft.     Tenderness: There is no abdominal tenderness.  Genitourinary:    Comments: GU exam deferred, self swab performed   Musculoskeletal:     Cervical back: Normal range of motion.  Lymphadenopathy:     Cervical: No cervical adenopathy.  Neurological:     General: No focal deficit present.     Mental Status: She is alert and oriented to person, place, and time.  Psychiatric:        Mood and Affect: Mood normal.        Behavior: Behavior normal.      UC Treatments / Results  Labs (all labs ordered are listed, but only abnormal results are displayed) Labs Reviewed  POCT URINALYSIS DIP (MANUAL ENTRY) - Abnormal; Notable for the following components:      Result Value   Clarity, UA hazy (*)    Ketones, POC UA trace (5) (*)    Spec Grav, UA >=1.030 (*)    Blood, UA small (*)    Protein Ur, POC =30 (*)    Leukocytes, UA Small (1+) (*)    All other components within normal limits  URINE CULTURE  CERVICOVAGINAL ANCILLARY ONLY    EKG   Radiology No results found.  Procedures Procedures (including critical care time)  Medications Ordered in UC Medications - No data to display  Initial Impression / Assessment and Plan / UC Course  I have reviewed the triage vital signs and the nursing notes.  Pertinent labs & imaging results that were available during my care of the patient were reviewed by me and considered in my medical decision making (see chart for details).  The patient is well-appearing, she is in no acute distress, vital signs are stable.  Urine culture is suspicious for urinary tract infection, urine culture is pending along with cytology testing.  Will treat patient empirically with fluconazole for a vaginal yeast infection.  Patient was advised she will be contacted if the pending test results are positive for both the cytology swab and urine culture.  Patient was also advised she will be asked to stop the antibiotic if the is negative.  Supportive care recommendations were provided to the patient to include increasing fluids, and adding yogurt to her diet to improve her vaginal  flora.  Patient was advised to follow-up with her primary care physician if symptoms fail to improve with this treatment.  Patient verbalizes understanding.  All questions were answered.  Patient stable for discharge.  Final Clinical Impressions(s) / UC Diagnoses   Final diagnoses:  Vaginal discharge  Vaginal itching  Vaginal irritation     Discharge Instructions      The urinalysis suggest that you may have a urinary tract infection.  A urine culture is pending.  You will be contacted if the urine ultra is negative and asked to stop the antibiotic. The cytology swab is also pending.  You will be contacted if the results of the swab are positive. Increase fluids.  Try to drink at least 8-10 8 ounce glasses of water or is much as you are able to drink while symptoms persist. Recommend eating yogurt daily to help improve your vaginal flora. As discussed, if symptoms fail to improve after this treatment, please follow-up with your primary care physician for further  evaluation. Follow-up as needed.     ED Prescriptions     Medication Sig Dispense Auth. Provider   fluconazole (DIFLUCAN) 150 MG tablet Take 1 tablet (150 mg total) by mouth daily. Take 1 pill today by mouth.  May repeat every 72 hours for up to 2 additional doses if symptoms persist. 3 tablet Nikala Walsworth-Warren, Alda Lea, NP   cephALEXin (KEFLEX) 500 MG capsule Take 1 capsule (500 mg total) by mouth 2 (two) times daily for 7 days. 14 capsule Haneef Hallquist-Warren, Alda Lea, NP      PDMP not reviewed this encounter.   Tish Men, NP 09/06/22 [phone]

## 2022-09-08 LAB — URINE CULTURE: Culture: 10000 — AB

## 2022-09-10 ENCOUNTER — Telehealth (HOSPITAL_COMMUNITY): Payer: Self-pay | Admitting: Emergency Medicine

## 2022-09-10 LAB — CERVICOVAGINAL ANCILLARY ONLY
Bacterial Vaginitis (gardnerella): POSITIVE — AB
Candida Glabrata: NEGATIVE
Candida Vaginitis: POSITIVE — AB
Chlamydia: NEGATIVE
Comment: NEGATIVE
Comment: NEGATIVE
Comment: NEGATIVE
Comment: NEGATIVE
Comment: NEGATIVE
Comment: NORMAL
Neisseria Gonorrhea: NEGATIVE
Trichomonas: NEGATIVE

## 2022-09-10 MED ORDER — METRONIDAZOLE 500 MG PO TABS: 500.0000 mg | ORAL_TABLET | Freq: Two times a day (BID) | ORAL | 0 refills | Status: DC

## 2022-09-13 ENCOUNTER — Encounter: Payer: Self-pay | Admitting: Emergency Medicine

## 2022-09-13 ENCOUNTER — Ambulatory Visit
Admission: EM | Admit: 2022-09-13 | Discharge: 2022-09-13 | Disposition: A | Discharge status: Home / Self Care | Payer: Medicaid Other | Attending: Family Medicine | Admitting: Family Medicine

## 2022-09-13 DIAGNOSIS — L02211 Cutaneous abscess of abdominal wall: Secondary | ICD-10-CM

## 2022-09-13 DIAGNOSIS — L0231 Cutaneous abscess of buttock: Secondary | ICD-10-CM | POA: Diagnosis not present

## 2022-09-13 MED ORDER — CHLORHEXIDINE GLUCONATE 4 % EX LIQD: Freq: Every day | CUTANEOUS | 0 refills | Status: DC | PRN

## 2022-09-13 MED ORDER — FLUCONAZOLE 150 MG PO TABS: 150.0000 mg | ORAL_TABLET | ORAL | 0 refills | Status: DC

## 2022-09-13 MED ORDER — SULFAMETHOXAZOLE-TRIMETHOPRIM 800-160 MG PO TABS: 1.0000 | ORAL_TABLET | Freq: Two times a day (BID) | ORAL | 0 refills | Status: AC

## 2022-09-13 MED ORDER — MUPIROCIN 2 % EX OINT: 1.0000 | TOPICAL_OINTMENT | Freq: Two times a day (BID) | CUTANEOUS | 0 refills | Status: DC

## 2022-09-13 NOTE — ED Triage Notes (Signed)
Was placed on an flagyl for a UTI on 12/26.  States she developed an abscess on stomach and left side of buttock.  States abscesses ruptured last night.  Thinks she may be allergic to the flagyl.

## 2022-09-13 NOTE — ED Provider Notes (Signed)
[facility]    CSN: [id number] Arrival date & time: 09/13/22  0801      History   Chief Complaint No chief complaint on file.   HPI Patricia Stanton is a 36 y.o. female.   Presenting today with several day history of boils forming to lower abdominal region as well as left buttock.  States the areas are painful, red, swollen and both have started draining at home over the last 24 hours.  Denies fever, chills, injury to the areas and has been applying topical over-the-counter creams and doing warm Epsom salt soaks with some mild relief.    Past Medical History:  Diagnosis Date   Adrenal tumor    Anxiety    Arthritis    left knee   Asthma    Chronic abdominal pain    Chronic headaches    Depression    Diabetes mellitus type 2 in obese ([place]) 11/20/2016   Endometriosis    Gastroesophageal reflux disease 11/09/2020   HLD (hyperlipidemia) 02/17/2017   Hypertension    Hypertension 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 ([place]) 03/25/2019   Mood disorder ([place])    Obesity    Ovarian cyst    Tobacco abuse     Patient Active Problem List   Diagnosis Date Noted   Abnormal iron saturation 08/06/2021   Chronic kidney disease, stage 3a ([place]) 07/31/2021   Upper respiratory infection with cough and congestion 07/17/2021   Elevated ALT measurement 01/04/2021   Gastroesophageal reflux disease 11/09/2020   S/P lumbar laminectomy 05/21/2019   Simple chronic bronchitis ([place]) 03/25/2019   Hypertension 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 ([place]) 03/25/2019   Chronic abdominal pain 03/25/2019   Leukocytosis 11/19/2018   Noncompliance 06/03/2017   Hyperlipidemia associated with type 2 diabetes mellitus ([place]) 02/17/2017   Major depressive disorder, recurrent episode, moderate ([place]) 02/03/2017   Vitamin D deficiency 11/20/2016   Diabetes mellitus type 2 in obese ([place]) 11/20/2016   PTSD (post-traumatic stress disorder)  11/19/2016   History of hernia surgery 11/19/2016   Pulmonary nodule/lesion, solitary 11/19/2016   Mucosal abnormality of stomach    Non-intractable vomiting 12/19/2015   Constipation 12/19/2015   Tobacco abuse 06/06/2013   Morbid obesity ([place]) 06/06/2013    Past Surgical History:  Procedure Laterality Date   CESAREAN SECTION  2008   [place]   CHOLECYSTECTOMY N/A 11/25/2014   Procedure: LAPAROSCOPIC CHOLECYSTECTOMY;  Surgeon: Aviva Signs Md, MD;  Location: [facility];  Service: General;  Laterality: N/A;   ESOPHAGOGASTRODUODENOSCOPY N/A 12/21/2015   Dr. Gala Romney: normal esophagus, non-bleeding erosive gastropathy, normal second portion of duodenum. Reactive gastropathy/chemical gastritis, negative H.pylori   ESOPHAGOGASTRODUODENOSCOPY (EGD) WITH PROPOFOL N/A 11/23/2020    Surgeon: Daneil Dolin, MD; normal esophagus, small hiatal hernia, normal examined duodenum.   FEMUR FRACTURE SURGERY Left 1995   tree fell on her   [place] N/A 01/29/2016   Procedure: Fatima Blank HERNIORRHAPHY WITH MESH;  Surgeon: Aviva Signs, MD;  Location: [facility];  Service: General;  Laterality: N/A;   INSERTION OF MESH  01/29/2016   Procedure: INSERTION OF MESH;  Surgeon: Aviva Signs, MD;  Location: [facility];  Service: General;;   KNEE ARTHROSCOPY Left    LUMBAR LAMINECTOMY/DECOMPRESSION MICRODISCECTOMY Right 05/21/2019   Procedure: Microdiscectomy - Lumbar four-Lumbar five - right;  Surgeon: Eustace Moore, MD;  Location: [place];  Service: Neurosurgery;  Laterality: Right;  TUBAL LIGATION      OB History   No obstetric history on file.      Home Medications    Prior to Admission medications   Medication Sig Start Date End Date Taking? Authorizing Provider  chlorhexidine (HIBICLENS) 4 % external liquid Apply topically daily as needed. 09/13/22  Yes Volney American, PA-C  fluconazole (DIFLUCAN) 150 MG tablet Take 1 tablet (150 mg total) by mouth once a week. 09/13/22  Yes Volney American, PA-C  mupirocin ointment (BACTROBAN) 2 % Apply 1 Application topically 2 (two) times daily. 09/13/22  Yes Volney American, PA-C  sulfamethoxazole-trimethoprim (BACTRIM DS) 800-160 MG tablet Take 1 tablet by mouth 2 (two) times daily for 7 days. 09/13/22 09/20/22 Yes Volney American, PA-C  albuterol (PROVENTIL) (2.5 MG/3ML) 0.083% nebulizer solution Take 3 mLs (2.5 mg total) by nebulization every 6 (six) hours as needed for wheezing or shortness of breath. 06/25/21   Sharion Balloon, FNP  albuterol (VENTOLIN HFA) 108 (90 Base) MCG/ACT inhaler Inhale 2 puffs into the lungs every 6 (six) hours as needed for wheezing or shortness of breath. 07/17/21   Ivy Lynn, NP  aspirin-acetaminophen-caffeine (EXCEDRIN MIGRAINE) [phone] MG tablet Take by mouth every 6 (six) hours as needed for headache.    [provider]  budesonide-formoterol (SYMBICORT) 80-4.5 MCG/ACT inhaler Inhale 2 puffs into the lungs 2 (two) times daily. 07/17/21   Ivy Lynn, NP  cephALEXin (KEFLEX) 500 MG capsule Take 1 capsule (500 mg total) by mouth 2 (two) times daily for 7 days. 09/06/22 09/13/22  Leath-Warren, Alda Lea, NP  cetirizine (ZYRTEC) 10 MG tablet Take 1 tablet (10 mg total) by mouth daily. 06/08/20   Evelina Dun A, FNP  escitalopram (LEXAPRO) 20 MG tablet Take 1 tablet (20 mg total) by mouth daily. 06/20/21 09/18/21  Norman Clay, MD  fluconazole (DIFLUCAN) 150 MG tablet Take 1 tablet (150 mg total) by mouth daily. Take 1 pill today by mouth.  May repeat every 72 hours for up to 2 additional doses if symptoms persist. 09/06/22   Leath-Warren, Alda Lea, NP  fluticasone (FLONASE) 50 MCG/ACT nasal spray Place 2 sprays into both nostrils daily. 04/21/22   Leath-Warren, Alda Lea, NP  gabapentin (NEURONTIN) 600 MG tablet Take 600 mg by mouth 3 (three) times daily. 11/05/20   [provider]  linaclotide Rolan Lipa) 72 MCG capsule Take 1 capsule (72 mcg total) by mouth  daily before breakfast. 08/06/21   Erenest Rasher, PA-C  lisinopril-hydrochlorothiazide (ZESTORETIC) 20-25 MG tablet Take 1 tablet by mouth daily. 11/29/20   Ivy Lynn, NP  lovastatin (MEVACOR) 20 MG tablet TAKE 1 TABLET(20 MG) BY MOUTH AT BEDTIME 09/03/21   Hawks, Alyse Low A, FNP  methocarbamol (ROBAXIN) 500 MG tablet Take 1 tablet (500 mg total) by mouth 3 (three) times daily as needed for muscle spasms. 06/28/21   Sharion Balloon, FNP  metroNIDAZOLE (FLAGYL) 500 MG tablet Take 1 tablet (500 mg total) by mouth 2 (two) times daily. 09/10/22   LampteyMyrene Galas, MD  oxyCODONE-acetaminophen (PERCOCET/ROXICET) 5-325 MG tablet Take 1 tablet by mouth every 4 (four) hours as needed for moderate pain. 05/21/19   Eustace Moore, MD  pantoprazole (PROTONIX) 40 MG tablet Take 1 tablet (40 mg total) by mouth 2 (two) times daily. 08/06/21   Erenest Rasher, PA-C  prazosin (MINIPRESS) 1 MG capsule Take 3 capsules (3 mg total) by mouth at bedtime. 06/20/21 09/18/21  Norman Clay, MD  pseudoephedrine (  SUDAFED) 60 MG tablet Take 60 mg by mouth every 4 (four) hours as needed for congestion.    [provider]  RYBELSUS 7 MG TABS TAKE 1 TABLET BY MOUTH DAILY 09/24/21   Hawks, Christy A, FNP  Semaglutide (OZEMPIC, 2 MG/DOSE, [place]) Inject into the skin.    [provider]    Family History Family History  Problem Relation Age of Onset   Hypertension Mother    Hyperlipidemia Mother    Fibromyalgia Mother    Other Mother        degenerative disc disease   Arthritis Mother    Kidney disease Mother    Diabetes Father    Hypertension Father    Hyperlipidemia Father    Heart disease Father 42   Heart attack Father    Stroke Father    Hyperlipidemia Brother    Hypertension Brother    Aneurysm Maternal Grandmother        AAA   Kidney disease Maternal Grandmother    Kidney disease Maternal Grandfather    Aneurysm Paternal Grandmother        AAA   Kidney disease Paternal Grandmother     Kidney disease Paternal Grandfather    Colon cancer Neg Hx    Inflammatory bowel disease Neg Hx     Social History Social History   Tobacco Use   Smoking status: Every Day    Packs/day: 0.50    Years: 11.00    Total pack years: 5.50    Types: Cigarettes    Start date: 09/17/2003   Smokeless tobacco: Never  Vaping Use   Vaping Use: Never used  Substance Use Topics   Alcohol use: No   Drug use: No     Allergies   Tramadol   Review of Systems Review of Systems Per HPI  Physical Exam Triage Vital Signs ED Triage Vitals  Enc Vitals Group     BP 09/13/22 0811 (!) 158/90     Pulse Rate 09/13/22 0811 96     Resp 09/13/22 0811 18     Temp 09/13/22 0811 98.3 F (36.8 C)     Temp Source 09/13/22 0811 Oral     SpO2 09/13/22 0811 97 %     Weight --      Height --      Head Circumference --      Peak Flow --      Pain Score 09/13/22 0816 9     Pain Loc --      Pain Edu? --      Excl. in [place]? --    No data found.  Updated Vital Signs BP (!) 158/90 (BP Location: Right Arm)   Pulse 96   Temp 98.3 F (36.8 C) (Oral)   Resp 18   LMP 08/16/2022 (Exact Date)   SpO2 97%   Visual Acuity Right Eye Distance:   Left Eye Distance:   Bilateral Distance:    Right Eye Near:   Left Eye Near:    Bilateral Near:     Physical Exam Vitals and nursing note reviewed.  Constitutional:      Appearance: Normal appearance. She is not ill-appearing.  HENT:     Head: Atraumatic.  Eyes:     Extraocular Movements: Extraocular movements intact.     Conjunctiva/sclera: Conjunctivae normal.  Cardiovascular:     Rate and Rhythm: Normal rate and regular rhythm.     Heart sounds: Normal heart sounds.  Pulmonary:     Effort:  Pulmonary effort is normal.     Breath sounds: Normal breath sounds.  Musculoskeletal:        General: Normal range of motion.     Cervical back: Normal range of motion and neck supple.  Skin:    General: Skin is warm.     Comments: Small ruptured abscess to  lower abdomen. Gluteal abscess to left gluteal fold, ruptured, mildly erythematous  Neurological:     Mental Status: She is alert and oriented to person, place, and time.  Psychiatric:        Mood and Affect: Mood normal.        Thought Content: Thought content normal.        Judgment: Judgment normal.     UC Treatments / Results  Labs (all labs ordered are listed, but only abnormal results are displayed) Labs Reviewed - No data to display  EKG  Radiology No results found.  Procedures Procedures (including critical care time)  Medications Ordered in UC Medications - No data to display  Initial Impression / Assessment and Plan / UC Course  I have reviewed the triage vital signs and the nursing notes.  Pertinent labs & imaging results that were available during my care of the patient were reviewed by me and considered in my medical decision making (see chart for details).     No indication for I and D as both have ruptured and drained spontaneously, treat with bactrim, hibiclens, mupirocin and discussed supportive care and return precautions  Final Clinical Impressions(s) / UC Diagnoses   Final diagnoses:  Abscess, gluteal  Abdominal wall abscess   Discharge Instructions   None    ED Prescriptions     Medication Sig Dispense Auth. Provider   sulfamethoxazole-trimethoprim (BACTRIM DS) 800-160 MG tablet Take 1 tablet by mouth 2 (two) times daily for 7 days. 14 tablet Volney American, [place]   chlorhexidine (HIBICLENS) 4 % external liquid Apply topically daily as needed. 120 mL Volney American, PA-C   mupirocin ointment (BACTROBAN) 2 % Apply 1 Application topically 2 (two) times daily. 22 g Volney American, [place]   fluconazole (DIFLUCAN) 150 MG tablet Take 1 tablet (150 mg total) by mouth once a week. 2 tablet Volney American, [place]      PDMP not reviewed this encounter.   Volney American, [place] 09/13/22 1244

## 2022-10-15 DIAGNOSIS — M549 Dorsalgia, unspecified: Secondary | ICD-10-CM | POA: Diagnosis not present

## 2022-10-15 DIAGNOSIS — E1143 Type 2 diabetes mellitus with diabetic autonomic (poly)neuropathy: Secondary | ICD-10-CM | POA: Diagnosis not present

## 2022-10-15 DIAGNOSIS — B9562 Methicillin resistant Staphylococcus aureus infection as the cause of diseases classified elsewhere: Secondary | ICD-10-CM | POA: Diagnosis not present

## 2022-10-15 DIAGNOSIS — L089 Local infection of the skin and subcutaneous tissue, unspecified: Secondary | ICD-10-CM | POA: Diagnosis not present

## 2022-10-15 DIAGNOSIS — R5383 Other fatigue: Secondary | ICD-10-CM | POA: Diagnosis not present

## 2022-10-15 DIAGNOSIS — Z3200 Encounter for pregnancy test, result unknown: Secondary | ICD-10-CM | POA: Diagnosis not present

## 2022-10-15 DIAGNOSIS — F32A Depression, unspecified: Secondary | ICD-10-CM | POA: Diagnosis not present

## 2022-10-15 DIAGNOSIS — Z79899 Other long term (current) drug therapy: Secondary | ICD-10-CM | POA: Diagnosis not present

## 2022-10-15 DIAGNOSIS — F419 Anxiety disorder, unspecified: Secondary | ICD-10-CM | POA: Diagnosis not present

## 2022-10-15 DIAGNOSIS — Z6841 Body Mass Index (BMI) 40.0 and over, adult: Secondary | ICD-10-CM | POA: Diagnosis not present

## 2022-10-15 DIAGNOSIS — N3091 Cystitis, unspecified with hematuria: Secondary | ICD-10-CM | POA: Diagnosis not present

## 2022-10-15 DIAGNOSIS — Z0130 Encounter for examination of blood pressure without abnormal findings: Secondary | ICD-10-CM | POA: Diagnosis not present

## 2022-10-15 DIAGNOSIS — E7800 Pure hypercholesterolemia, unspecified: Secondary | ICD-10-CM | POA: Diagnosis not present

## 2022-11-13 DIAGNOSIS — M129 Arthropathy, unspecified: Secondary | ICD-10-CM | POA: Diagnosis not present

## 2022-11-13 DIAGNOSIS — F419 Anxiety disorder, unspecified: Secondary | ICD-10-CM | POA: Diagnosis not present

## 2022-11-13 DIAGNOSIS — Z0130 Encounter for examination of blood pressure without abnormal findings: Secondary | ICD-10-CM | POA: Diagnosis not present

## 2022-11-13 DIAGNOSIS — Z6841 Body Mass Index (BMI) 40.0 and over, adult: Secondary | ICD-10-CM | POA: Diagnosis not present

## 2022-11-13 DIAGNOSIS — Z3200 Encounter for pregnancy test, result unknown: Secondary | ICD-10-CM | POA: Diagnosis not present

## 2022-11-13 DIAGNOSIS — G8929 Other chronic pain: Secondary | ICD-10-CM | POA: Diagnosis not present

## 2022-11-13 DIAGNOSIS — E1143 Type 2 diabetes mellitus with diabetic autonomic (poly)neuropathy: Secondary | ICD-10-CM | POA: Diagnosis not present

## 2022-11-13 DIAGNOSIS — M549 Dorsalgia, unspecified: Secondary | ICD-10-CM | POA: Diagnosis not present

## 2022-11-13 DIAGNOSIS — E7800 Pure hypercholesterolemia, unspecified: Secondary | ICD-10-CM | POA: Diagnosis not present

## 2022-11-13 DIAGNOSIS — Z79899 Other long term (current) drug therapy: Secondary | ICD-10-CM | POA: Diagnosis not present

## 2022-11-13 DIAGNOSIS — F32A Depression, unspecified: Secondary | ICD-10-CM | POA: Diagnosis not present

## 2022-11-13 DIAGNOSIS — R5383 Other fatigue: Secondary | ICD-10-CM | POA: Diagnosis not present

## 2022-11-13 DIAGNOSIS — D509 Iron deficiency anemia, unspecified: Secondary | ICD-10-CM | POA: Diagnosis not present

## 2022-11-29 ENCOUNTER — Ambulatory Visit
Admission: RE | Admit: 2022-11-29 | Discharge: 2022-11-29 | Disposition: A | Discharge status: Home / Self Care | Payer: Medicaid Other | Source: Ambulatory Visit | Attending: Family Medicine

## 2022-11-29 VITALS — BP 195/99 | HR 100 | Temp 99.5°F | Resp 18

## 2022-11-29 DIAGNOSIS — L0231 Cutaneous abscess of buttock: Secondary | ICD-10-CM | POA: Diagnosis not present

## 2022-11-29 MED ORDER — DOXYCYCLINE HYCLATE 100 MG PO CAPS: 100.0000 mg | ORAL_CAPSULE | Freq: Two times a day (BID) | ORAL | 0 refills | Status: DC

## 2022-11-29 MED ORDER — CHLORHEXIDINE GLUCONATE 4 % EX LIQD: Freq: Every day | CUTANEOUS | 0 refills | Status: DC | PRN

## 2022-11-29 NOTE — ED Provider Notes (Signed)
[facility]    CSN: [id number] Arrival date & time: 11/29/22  [id number]      History   Chief Complaint Chief Complaint  Patient presents with   Abscess    Entered by patient   Appointment    1000    HPI Patricia Stanton is a 37 y.o. female.   Patient presenting today with a right gluteal abscess for the past 3 days.  She states has been trying warm compresses and topical wound solutions with no relief.  Denies any drainage, bleeding, injury to the area, change in bowel movements.    Past Medical History:  Diagnosis Date   Adrenal tumor    Anxiety    Arthritis    left knee   Asthma    Chronic abdominal pain    Chronic headaches    Depression    Diabetes mellitus type 2 in obese ([place]) 11/20/2016   Endometriosis    Gastroesophageal reflux disease 11/09/2020   HLD (hyperlipidemia) 02/17/2017   Hypertension    Hypertension 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 ([place]) 03/25/2019   Mood disorder ([place])    Obesity    Ovarian cyst    Tobacco abuse     Patient Active Problem List   Diagnosis Date Noted   Abnormal iron saturation 08/06/2021   Chronic kidney disease, stage 3a ([place]) 07/31/2021   Upper respiratory infection with cough and congestion 07/17/2021   Elevated ALT measurement 01/04/2021   Gastroesophageal reflux disease 11/09/2020   S/P lumbar laminectomy 05/21/2019   Simple chronic bronchitis ([place]) 03/25/2019   Hypertension 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 ([place]) 03/25/2019   Chronic abdominal pain 03/25/2019   Leukocytosis 11/19/2018   Noncompliance 06/03/2017   Hyperlipidemia associated with type 2 diabetes mellitus ([place]) 02/17/2017   Major depressive disorder, recurrent episode, moderate ([place]) 02/03/2017   Vitamin D deficiency 11/20/2016   Diabetes mellitus type 2 in obese ([place]) 11/20/2016   PTSD (post-traumatic stress disorder) 11/19/2016   History of hernia surgery 11/19/2016    Pulmonary nodule/lesion, solitary 11/19/2016   Mucosal abnormality of stomach    Non-intractable vomiting 12/19/2015   Constipation 12/19/2015   Tobacco abuse 06/06/2013   Morbid obesity ([place]) 06/06/2013    Past Surgical History:  Procedure Laterality Date   CESAREAN SECTION  2008   [place]   CHOLECYSTECTOMY N/A 11/25/2014   Procedure: LAPAROSCOPIC CHOLECYSTECTOMY;  Surgeon: Aviva Signs Md, MD;  Location: [facility];  Service: General;  Laterality: N/A;   ESOPHAGOGASTRODUODENOSCOPY N/A 12/21/2015   Dr. Gala Romney: normal esophagus, non-bleeding erosive gastropathy, normal second portion of duodenum. Reactive gastropathy/chemical gastritis, negative H.pylori   ESOPHAGOGASTRODUODENOSCOPY (EGD) WITH PROPOFOL N/A 11/23/2020    Surgeon: Daneil Dolin, MD; normal esophagus, small hiatal hernia, normal examined duodenum.   FEMUR FRACTURE SURGERY Left 1995   tree fell on her   [place] N/A 01/29/2016   Procedure: Fatima Blank HERNIORRHAPHY WITH MESH;  Surgeon: Aviva Signs, MD;  Location: [facility];  Service: General;  Laterality: N/A;   INSERTION OF MESH  01/29/2016   Procedure: INSERTION OF MESH;  Surgeon: Aviva Signs, MD;  Location: [facility];  Service: General;;   KNEE ARTHROSCOPY Left    LUMBAR LAMINECTOMY/DECOMPRESSION MICRODISCECTOMY Right 05/21/2019   Procedure: Microdiscectomy - Lumbar four-Lumbar five - right;  Surgeon: Eustace Moore, MD;  Location: [place];  Service: Neurosurgery;  Laterality: Right;   TUBAL LIGATION  OB History   No obstetric history on file.      Home Medications    Prior to Admission medications   Medication Sig Start Date End Date Taking? Authorizing Provider  chlorhexidine (HIBICLENS) 4 % external liquid Apply topically daily as needed. 11/29/22  Yes Volney American, PA-C  doxycycline (VIBRAMYCIN) 100 MG capsule Take 1 capsule (100 mg total) by mouth 2 (two) times daily. 11/29/22  Yes Volney American, PA-C  albuterol  (PROVENTIL) (2.5 MG/3ML) 0.083% nebulizer solution Take 3 mLs (2.5 mg total) by nebulization every 6 (six) hours as needed for wheezing or shortness of breath. 06/25/21   Sharion Balloon, FNP  albuterol (VENTOLIN HFA) 108 (90 Base) MCG/ACT inhaler Inhale 2 puffs into the lungs every 6 (six) hours as needed for wheezing or shortness of breath. 07/17/21   Ivy Lynn, NP  aspirin-acetaminophen-caffeine (EXCEDRIN MIGRAINE) [phone] MG tablet Take by mouth every 6 (six) hours as needed for headache.    [provider]  budesonide-formoterol (SYMBICORT) 80-4.5 MCG/ACT inhaler Inhale 2 puffs into the lungs 2 (two) times daily. 07/17/21   Ivy Lynn, NP  cetirizine (ZYRTEC) 10 MG tablet Take 1 tablet (10 mg total) by mouth daily. 06/08/20   Evelina Dun A, FNP  chlorhexidine (HIBICLENS) 4 % external liquid Apply topically daily as needed. 09/13/22   Volney American, PA-C  escitalopram (LEXAPRO) 20 MG tablet Take 1 tablet (20 mg total) by mouth daily. 06/20/21 09/18/21  Norman Clay, MD  fluconazole (DIFLUCAN) 150 MG tablet Take 1 tablet (150 mg total) by mouth daily. Take 1 pill today by mouth.  May repeat every 72 hours for up to 2 additional doses if symptoms persist. 09/06/22   Leath-Warren, Alda Lea, NP  fluconazole (DIFLUCAN) 150 MG tablet Take 1 tablet (150 mg total) by mouth once a week. 09/13/22   Volney American, PA-C  fluticasone [facility]) 50 MCG/ACT nasal spray Place 2 sprays into both nostrils daily. 04/21/22   Leath-Warren, Alda Lea, NP  gabapentin (NEURONTIN) 600 MG tablet Take 600 mg by mouth 3 (three) times daily. 11/05/20   [provider]  linaclotide Rolan Lipa) 72 MCG capsule Take 1 capsule (72 mcg total) by mouth daily before breakfast. 08/06/21   Erenest Rasher, PA-C  lisinopril-hydrochlorothiazide (ZESTORETIC) 20-25 MG tablet Take 1 tablet by mouth daily. 11/29/20   Ivy Lynn, NP  lovastatin (MEVACOR) 20 MG tablet TAKE 1 TABLET(20 MG) BY  MOUTH AT BEDTIME 09/03/21   Hawks, Alyse Low A, FNP  methocarbamol (ROBAXIN) 500 MG tablet Take 1 tablet (500 mg total) by mouth 3 (three) times daily as needed for muscle spasms. 06/28/21   Sharion Balloon, FNP  metroNIDAZOLE (FLAGYL) 500 MG tablet Take 1 tablet (500 mg total) by mouth 2 (two) times daily. 09/10/22   LampteyMyrene Galas, MD  mupirocin ointment (BACTROBAN) 2 % Apply 1 Application topically 2 (two) times daily. 09/13/22   Volney American, PA-C  oxyCODONE-acetaminophen (PERCOCET/ROXICET) 5-325 MG tablet Take 1 tablet by mouth every 4 (four) hours as needed for moderate pain. 05/21/19   Eustace Moore, MD  pantoprazole (PROTONIX) 40 MG tablet Take 1 tablet (40 mg total) by mouth 2 (two) times daily. 08/06/21   Erenest Rasher, PA-C  prazosin (MINIPRESS) 1 MG capsule Take 3 capsules (3 mg total) by mouth at bedtime. 06/20/21 09/18/21  Norman Clay, MD  pseudoephedrine (SUDAFED) 60 MG tablet Take 60 mg by mouth every 4 (four) hours as needed for congestion.  [provider]  RYBELSUS 7 MG TABS TAKE 1 TABLET BY MOUTH DAILY 09/24/21   Hawks, Christy A, FNP  Semaglutide (OZEMPIC, 2 MG/DOSE, [place]) Inject into the skin.    [provider]    Family History Family History  Problem Relation Age of Onset   Hypertension Mother    Hyperlipidemia Mother    Fibromyalgia Mother    Other Mother        degenerative disc disease   Arthritis Mother    Kidney disease Mother    Diabetes Father    Hypertension Father    Hyperlipidemia Father    Heart disease Father 64   Heart attack Father    Stroke Father    Hyperlipidemia Brother    Hypertension Brother    Aneurysm Maternal Grandmother        AAA   Kidney disease Maternal Grandmother    Kidney disease Maternal Grandfather    Aneurysm Paternal Grandmother        AAA   Kidney disease Paternal Grandmother    Kidney disease Paternal Grandfather    Colon cancer Neg Hx    Inflammatory bowel disease Neg Hx     Social  History Social History   Tobacco Use   Smoking status: Every Day    Packs/day: 0.50    Years: 11.00    Additional pack years: 0.00    Total pack years: 5.50    Types: Cigarettes    Start date: 09/17/2003   Smokeless tobacco: Never  Vaping Use   Vaping Use: Never used  Substance Use Topics   Alcohol use: No   Drug use: No     Allergies   Tramadol   Review of Systems Review of Systems Per HPI  Physical Exam Triage Vital Signs ED Triage Vitals  Enc Vitals Group     BP 11/29/22 0959 (!) 195/99     Pulse Rate 11/29/22 0959 100     Resp 11/29/22 0959 18     Temp 11/29/22 0959 99.5 F (37.5 C)     Temp Source 11/29/22 0959 Oral     SpO2 11/29/22 0959 94 %     Weight --      Height --      Head Circumference --      Peak Flow --      Pain Score 11/29/22 1002 10     Pain Loc --      Pain Edu? --      Excl. in [place]? --    No data found.  Updated Vital Signs BP (!) 195/99 (BP Location: Right Wrist)   Pulse 100   Temp 99.5 F (37.5 C) (Oral)   Resp 18   LMP  (Within Weeks) Comment: 3-4 weeks  SpO2 94%   Visual Acuity Right Eye Distance:   Left Eye Distance:   Bilateral Distance:    Right Eye Near:   Left Eye Near:    Bilateral Near:     Physical Exam Vitals and nursing note reviewed. Exam conducted with a chaperone present.  Constitutional:      Appearance: Normal appearance. She is not ill-appearing.  HENT:     Head: Atraumatic.  Eyes:     Extraocular Movements: Extraocular movements intact.     Conjunctiva/sclera: Conjunctivae normal.  Cardiovascular:     Rate and Rhythm: Normal rate and regular rhythm.     Heart sounds: Normal heart sounds.  Pulmonary:     Effort: Pulmonary effort is normal.  Breath sounds: Normal breath sounds.  Genitourinary:    Comments: 1 cm fluctuant tender erythematous abscess to the right lower gluteal fold.  No active drainage Musculoskeletal:        General: Normal range of motion.     Cervical back: Normal range  of motion and neck supple.  Skin:    General: Skin is warm and dry.  Neurological:     Mental Status: She is alert and oriented to person, place, and time.  Psychiatric:        Mood and Affect: Mood normal.        Thought Content: Thought content normal.        Judgment: Judgment normal.      UC Treatments / Results  Labs (all labs ordered are listed, but only abnormal results are displayed) Labs Reviewed - No data to display  EKG   Radiology No results found.  Procedures Incision and Drainage  Date/Time: 11/29/2022 10:47 AM  Performed by: Volney American, PA-C Authorized by: Volney American, PA-C   Consent:    Consent obtained:  Verbal   Consent given by:  Patient   Risks, benefits, and alternatives were discussed: yes     Risks discussed:  Bleeding, incomplete drainage and infection   Alternatives discussed:  Observation and alternative treatment Universal protocol:    Procedure explained and questions answered to patient or proxy's satisfaction: yes     Relevant documents present and verified: yes     Patient identity confirmed:  Verbally with patient and arm band Location:    Type:  Abscess   Size:  1 cm   Location: Right gluteal fold. Pre-procedure details:    Skin preparation:  Chlorhexidine Sedation:    Sedation type:  None Anesthesia:    Anesthesia method:  Local infiltration   Local anesthetic:  Lidocaine 2% WITH epi Procedure type:    Complexity:  Simple Procedure details:    Ultrasound guidance: no     Needle aspiration: no     Incision types:  Stab incision   Incision depth:  Dermal   Wound management:  Probed and deloculated   Drainage:  Purulent and bloody   Drainage amount:  Moderate   Wound treatment:  Wound left open   Packing materials:  None Post-procedure details:    Procedure completion:  Tolerated well, no immediate complications  (including critical care time)  Medications Ordered in UC Medications - No data to  display  Initial Impression / Assessment and Plan / UC Course  I have reviewed the triage vital signs and the nursing notes.  Pertinent labs & imaging results that were available during my care of the patient were reviewed by me and considered in my medical decision making (see chart for details).     Wound was drained without complication, will place on doxycycline, Hibiclens, continue warm compresses and follow-up with PCP next week for recheck.  Return sooner for worsening symptoms. Final Clinical Impressions(s) / UC Diagnoses   Final diagnoses:  Gluteal abscess   Discharge Instructions   None    ED Prescriptions     Medication Sig Dispense Auth. Provider   doxycycline (VIBRAMYCIN) 100 MG capsule Take 1 capsule (100 mg total) by mouth 2 (two) times daily. 14 capsule Volney American, [place]   chlorhexidine (HIBICLENS) 4 % external liquid Apply topically daily as needed. 120 mL Volney American, [place]      PDMP not reviewed this encounter.   Volney American, [place]  11/29/22 1050  

## 2022-11-29 NOTE — ED Triage Notes (Signed)
Pt reports she has an abscess in the right buttock x 3 days

## 2022-12-11 DIAGNOSIS — Z6841 Body Mass Index (BMI) 40.0 and over, adult: Secondary | ICD-10-CM | POA: Diagnosis not present

## 2022-12-11 DIAGNOSIS — F419 Anxiety disorder, unspecified: Secondary | ICD-10-CM | POA: Diagnosis not present

## 2022-12-11 DIAGNOSIS — F32A Depression, unspecified: Secondary | ICD-10-CM | POA: Diagnosis not present

## 2022-12-11 DIAGNOSIS — M549 Dorsalgia, unspecified: Secondary | ICD-10-CM | POA: Diagnosis not present

## 2022-12-11 DIAGNOSIS — M539 Dorsopathy, unspecified: Secondary | ICD-10-CM | POA: Diagnosis not present

## 2022-12-11 DIAGNOSIS — Z0130 Encounter for examination of blood pressure without abnormal findings: Secondary | ICD-10-CM | POA: Diagnosis not present

## 2022-12-11 DIAGNOSIS — Z760 Encounter for issue of repeat prescription: Secondary | ICD-10-CM | POA: Diagnosis not present

## 2022-12-11 DIAGNOSIS — E1143 Type 2 diabetes mellitus with diabetic autonomic (poly)neuropathy: Secondary | ICD-10-CM | POA: Diagnosis not present

## 2022-12-11 DIAGNOSIS — G43909 Migraine, unspecified, not intractable, without status migrainosus: Secondary | ICD-10-CM | POA: Diagnosis not present

## 2022-12-11 DIAGNOSIS — D509 Iron deficiency anemia, unspecified: Secondary | ICD-10-CM | POA: Diagnosis not present

## 2022-12-11 DIAGNOSIS — Z3200 Encounter for pregnancy test, result unknown: Secondary | ICD-10-CM | POA: Diagnosis not present

## 2022-12-11 DIAGNOSIS — Z79899 Other long term (current) drug therapy: Secondary | ICD-10-CM | POA: Diagnosis not present

## 2022-12-17 ENCOUNTER — Ambulatory Visit
Admission: RE | Admit: 2022-12-17 | Discharge: 2022-12-17 | Disposition: A | Discharge status: Home / Self Care | Payer: Medicaid Other | Source: Ambulatory Visit | Attending: Nurse Practitioner | Admitting: Nurse Practitioner

## 2022-12-17 VITALS — BP 141/82 | HR 99 | Temp 98.0°F | Resp 18

## 2022-12-17 DIAGNOSIS — R197 Diarrhea, unspecified: Secondary | ICD-10-CM

## 2022-12-17 DIAGNOSIS — R112 Nausea with vomiting, unspecified: Secondary | ICD-10-CM

## 2022-12-17 MED ORDER — ONDANSETRON 4 MG PO TBDP
4.0000 mg | ORAL_TABLET | Freq: Once | ORAL | Status: AC
  Administered 2022-12-17: 4 mg via ORAL

## 2022-12-17 MED ORDER — ONDANSETRON 4 MG PO TBDP: 4.0000 mg | ORAL_TABLET | Freq: Three times a day (TID) | ORAL | 0 refills | Status: DC | PRN

## 2022-12-17 NOTE — ED Triage Notes (Signed)
Pt reports she ate some spoiled eggs x 3 days and now has to keep vomiting.

## 2022-12-17 NOTE — ED Provider Notes (Signed)
[facility]    CSN: [id number] Arrival date & time: 12/17/22  1219      History   Chief Complaint Chief Complaint  Patient presents with   Abdominal Pain    Entered by patient    HPI Patricia Stanton is a 37 y.o. female.   Patient presents with abdominal pain ongoing 2 days.  Reports the pain onset is sudden and severity moderate.  Describes the pain as cramping and sore.  The pain lasts minutes.  The patient denies radiation of pain to back, other side of abdomen, and leg. The patient denies weight loss, bloody vomit, constipation, blood in stool, heartburn, rash, dysuria/urinary frequency, hematuria, and frequent NSAID use. The patient endorses body aches/chills, nausea/vomiting, decreased appetite, and diarrhea. Reports 1 episode of vomiting today - she vomiting up her morning medications including Zofran.  Multiple episodes of watery dark brown diarrhea that is almost pure water - almost 10.  No blood in the stool.  Has tried Zofran and Protonix without relief of symptoms.   No known contacts with similar symptoms.  Reports she thinks she may have eaten rotten eggs Sunday morning prior to symptoms starting.    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 03/25/2019   Mood disorder    Obesity    Ovarian cyst    Tobacco abuse     Patient Active Problem List   Diagnosis Date Noted   Abnormal iron saturation 08/06/2021   Chronic kidney disease, stage 3a 07/31/2021   Upper respiratory infection with cough and congestion 07/17/2021   Elevated ALT measurement 01/04/2021   Gastroesophageal reflux disease  11/09/2020   S/P lumbar laminectomy 05/21/2019   Simple chronic bronchitis 03/25/2019   Hypertension 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 03/25/2019   Chronic abdominal pain 03/25/2019   Leukocytosis 11/19/2018   Noncompliance 06/03/2017   Hyperlipidemia associated with type 2 diabetes mellitus 02/17/2017   Major depressive disorder, recurrent episode, moderate 02/03/2017   Vitamin D deficiency 11/20/2016   Diabetes mellitus type 2 in obese 11/20/2016   PTSD (post-traumatic stress disorder) 11/19/2016   History of hernia surgery 11/19/2016   Pulmonary nodule/lesion, solitary 11/19/2016   Mucosal abnormality of stomach    Non-intractable vomiting 12/19/2015   Constipation 12/19/2015   Tobacco abuse 06/06/2013   Morbid obesity 06/06/2013    Past Surgical History:  Procedure Laterality Date   CESAREAN SECTION  2008   [place]   CHOLECYSTECTOMY N/A 11/25/2014   Procedure: LAPAROSCOPIC CHOLECYSTECTOMY;  Surgeon: Aviva Signs Md, MD;  Location: [facility];  Service: General;  Laterality: N/A;   ESOPHAGOGASTRODUODENOSCOPY N/A 12/21/2015   Dr. Gala Romney: normal esophagus, non-bleeding erosive gastropathy, normal second portion of duodenum. Reactive gastropathy/chemical gastritis, negative H.pylori   ESOPHAGOGASTRODUODENOSCOPY (EGD) WITH PROPOFOL N/A 11/23/2020    Surgeon: Daneil Dolin, MD; normal esophagus, small hiatal hernia, normal examined duodenum.   FEMUR FRACTURE SURGERY Left 1995   tree fell on her   [place] N/A 01/29/2016  Procedure: INCISIONAL HERNIORRHAPHY WITH MESH;  Surgeon: Aviva Signs, MD;  Location: [facility];  Service: General;  Laterality: N/A;   INSERTION OF MESH  01/29/2016   Procedure: INSERTION OF MESH;  Surgeon: Aviva Signs, MD;  Location: [facility];  Service: General;;   KNEE ARTHROSCOPY Left    LUMBAR LAMINECTOMY/DECOMPRESSION MICRODISCECTOMY Right 05/21/2019   Procedure: Microdiscectomy - Lumbar four-Lumbar five  - right;  Surgeon: Eustace Moore, MD;  Location: [place];  Service: Neurosurgery;  Laterality: Right;   TUBAL LIGATION      OB History   No obstetric history on file.      Home Medications    Prior to Admission medications   Medication Sig Start Date End Date Taking? Authorizing Provider  ondansetron (ZOFRAN-ODT) 4 MG disintegrating tablet Take 1 tablet (4 mg total) by mouth every 8 (eight) hours as needed for nausea or vomiting. 12/17/22  Yes Noemi Chapel A, NP  albuterol (PROVENTIL) (2.5 MG/3ML) 0.083% nebulizer solution Take 3 mLs (2.5 mg total) by nebulization every 6 (six) hours as needed for wheezing or shortness of breath. 06/25/21   Sharion Balloon, FNP  albuterol (VENTOLIN HFA) 108 (90 Base) MCG/ACT inhaler Inhale 2 puffs into the lungs every 6 (six) hours as needed for wheezing or shortness of breath. 07/17/21   Ivy Lynn, NP  aspirin-acetaminophen-caffeine (EXCEDRIN MIGRAINE) [id number] MG tablet Take by mouth every 6 (six) hours as needed for headache.    [provider]  budesonide-formoterol (SYMBICORT) 80-4.5 MCG/ACT inhaler Inhale 2 puffs into the lungs 2 (two) times daily. 07/17/21   Ivy Lynn, NP  cetirizine (ZYRTEC) 10 MG tablet Take 1 tablet (10 mg total) by mouth daily. 06/08/20   Evelina Dun A, FNP  chlorhexidine (HIBICLENS) 4 % external liquid Apply topically daily as needed. 09/13/22   Volney American, PA-C  chlorhexidine (HIBICLENS) 4 % external liquid Apply topically daily as needed. 11/29/22   Volney American, PA-C  escitalopram (LEXAPRO) 20 MG tablet Take 1 tablet (20 mg total) by mouth daily. 06/20/21 09/18/21  Norman Clay, MD  fluticasone (FLONASE) 50 MCG/ACT nasal spray Place 2 sprays into both nostrils daily. 04/21/22   Leath-Warren, Alda Lea, NP  gabapentin (NEURONTIN) 600 MG tablet Take 600 mg by mouth 3 (three) times daily. 11/05/20   [provider]  linaclotide Rolan Lipa) 72 MCG capsule Take 1 capsule (72 mcg  total) by mouth daily before breakfast. 08/06/21   Erenest Rasher, PA-C  lisinopril-hydrochlorothiazide (ZESTORETIC) 20-25 MG tablet Take 1 tablet by mouth daily. 11/29/20   Ivy Lynn, NP  lovastatin (MEVACOR) 20 MG tablet TAKE 1 TABLET(20 MG) BY MOUTH AT BEDTIME 09/03/21   Hawks, Alyse Low A, FNP  methocarbamol (ROBAXIN) 500 MG tablet Take 1 tablet (500 mg total) by mouth 3 (three) times daily as needed for muscle spasms. 06/28/21   Sharion Balloon, FNP  mupirocin ointment (BACTROBAN) 2 % Apply 1 Application topically 2 (two) times daily. 09/13/22   Volney American, PA-C  oxyCODONE-acetaminophen (PERCOCET/ROXICET) 5-325 MG tablet Take 1 tablet by mouth every 4 (four) hours as needed for moderate pain. 05/21/19   Eustace Moore, MD  pantoprazole (PROTONIX) 40 MG tablet Take 1 tablet (40 mg total) by mouth 2 (two) times daily. 08/06/21   Erenest Rasher, PA-C  prazosin (MINIPRESS) 1 MG capsule Take 3 capsules (3 mg total) by mouth at bedtime. 06/20/21 09/18/21  Norman Clay, MD  pseudoephedrine (SUDAFED) 60 MG tablet Take 60 mg by mouth  every 4 (four) hours as needed for congestion.    [provider]  RYBELSUS 7 MG TABS TAKE 1 TABLET BY MOUTH DAILY 09/24/21   Hawks, Christy A, FNP  Semaglutide (OZEMPIC, 2 MG/DOSE, [place]) Inject into the skin.    [provider]    Family History Family History  Problem Relation Age of Onset   Hypertension Mother    Hyperlipidemia Mother    Fibromyalgia Mother    Other Mother        degenerative disc disease   Arthritis Mother    Kidney disease Mother    Diabetes Father    Hypertension Father    Hyperlipidemia Father    Heart disease Father 18   Heart attack Father    Stroke Father    Hyperlipidemia Brother    Hypertension Brother    Aneurysm Maternal Grandmother        AAA   Kidney disease Maternal Grandmother    Kidney disease Maternal Grandfather    Aneurysm Paternal Grandmother        AAA   Kidney disease Paternal  Grandmother    Kidney disease Paternal Grandfather    Colon cancer Neg Hx    Inflammatory bowel disease Neg Hx     Social History Social History   Tobacco Use   Smoking status: Every Day    Packs/day: 0.50    Years: 11.00    Additional pack years: 0.00    Total pack years: 5.50    Types: Cigarettes    Start date: 09/17/2003   Smokeless tobacco: Never  Vaping Use   Vaping Use: Never used  Substance Use Topics   Alcohol use: No   Drug use: No     Allergies   Tramadol   Review of Systems Review of Systems Per HPI  Physical Exam Triage Vital Signs ED Triage Vitals  Enc Vitals Group     BP 12/17/22 1250 138/83     Pulse Rate 12/17/22 1250 (!) 105     Resp 12/17/22 1250 20     Temp 12/17/22 1250 98 F (36.7 C)     Temp Source 12/17/22 1250 Oral     SpO2 12/17/22 1250 95 %     Weight --      Height --      Head Circumference --      Peak Flow --      Pain Score 12/17/22 1251 10     Pain Loc --      Pain Edu? --      Excl. in [place]? --    Orthostatic VS for the past 24 hrs:  BP- Lying Pulse- Lying BP- Sitting Pulse- Sitting BP- Standing at 0 minutes Pulse- Standing at 0 minutes  12/17/22 1337 119/76 105 (!) 141/92 105 113/76 128    Updated Vital Signs BP (!) 141/82 (BP Location: Right Arm)   Pulse 99   Temp 98 F (36.7 C) (Oral)   Resp 18   LMP 12/06/2022 (Within Weeks)   SpO2 96%   Visual Acuity Right Eye Distance:   Left Eye Distance:   Bilateral Distance:    Right Eye Near:   Left Eye Near:    Bilateral Near:     Physical Exam Vitals and nursing note reviewed.  Constitutional:      General: She is not in acute distress.    Appearance: Normal appearance. She is not toxic-appearing.  HENT:     Head: Normocephalic and atraumatic.  Mouth/Throat:     Mouth: Mucous membranes are moist.     Pharynx: Oropharynx is clear.  Eyes:     General: No scleral icterus.    Extraocular Movements: Extraocular movements intact.  Cardiovascular:     Rate  and Rhythm: Normal rate and regular rhythm.  Pulmonary:     Effort: Pulmonary effort is normal. No respiratory distress.     Breath sounds: Normal breath sounds. No wheezing, rhonchi or rales.  Abdominal:     General: Abdomen is flat. Bowel sounds are normal. There is no distension.     Palpations: Abdomen is soft.     Tenderness: There is no abdominal tenderness. There is no right CVA tenderness, left CVA tenderness or guarding. Negative signs include Murphy's sign.  Musculoskeletal:     Cervical back: Normal range of motion.  Lymphadenopathy:     Cervical: No cervical adenopathy.  Skin:    General: Skin is warm and dry.     Capillary Refill: Capillary refill takes less than 2 seconds.     Coloration: Skin is not jaundiced or pale.     Findings: No erythema.  Neurological:     Mental Status: She is alert and oriented to person, place, and time.  Psychiatric:        Behavior: Behavior is cooperative.      UC Treatments / Results  Labs (all labs ordered are listed, but only abnormal results are displayed) Labs Reviewed - No data to display  EKG   Radiology No results found.  Procedures Procedures (including critical care time)  Medications Ordered in UC Medications  ondansetron (ZOFRAN-ODT) disintegrating tablet 4 mg (4 mg Oral Given 12/17/22 1323)    Initial Impression / Assessment and Plan / UC Course  I have reviewed the triage vital signs and the nursing notes.  Pertinent labs & imaging results that were available during my care of the patient were reviewed by me and considered in my medical decision making (see chart for details).   Patient is well-appearing, normotensive, afebrile, not tachycardic, not tachypneic, oxygenating well on room air.    1. Nausea, vomiting, and diarrhea Vital signs and examination today reassuring Orthostatic vital signs are negative Zofran 4 mg ODT given with improvement in abdominal mental pain and nausea; patient able to tolerate  liquids after Zofran Begin Zofran ODT at home and prescription given Suspect viral gastroenteritis Supportive care discussed, continue to increase hydration Seek care in emergency room for worsening symptoms or here if symptoms are persistent without improvement  The patient was given the opportunity to ask questions.  All questions answered to their satisfaction.  The patient is in agreement to this plan.    Final Clinical Impressions(s) / UC Diagnoses   Final diagnoses:  Nausea, vomiting, and diarrhea     Discharge Instructions      Increase intake of water, or sugar-free electrolyte drinks like Pedialyte or sugar-free Gatorade  You can take the dissolvable Zofran every 8 hours under your tongue to help prevent nausea/vomiting  Seek care in ER if you develop blood in your vomit or stool, or are unable to keep fluids down with the antinausea medicine     ED Prescriptions     Medication Sig Dispense Auth. Provider   ondansetron (ZOFRAN-ODT) 4 MG disintegrating tablet Take 1 tablet (4 mg total) by mouth every 8 (eight) hours as needed for nausea or vomiting. 20 tablet Eulogio Bear, NP      PDMP not reviewed this encounter.  Eulogio Bear, NP 12/17/22 1441

## 2022-12-17 NOTE — Discharge Instructions (Signed)
Increase intake of water, or sugar-free electrolyte drinks like Pedialyte or sugar-free Gatorade  You can take the dissolvable Zofran every 8 hours under your tongue to help prevent nausea/vomiting  Seek care in ER if you develop blood in your vomit or stool, or are unable to keep fluids down with the antinausea medicine

## 2022-12-27 ENCOUNTER — Encounter: Payer: Self-pay | Admitting: Obstetrics & Gynecology

## 2022-12-27 ENCOUNTER — Other Ambulatory Visit (HOSPITAL_COMMUNITY)
Admission: RE | Admit: 2022-12-27 | Discharge: 2022-12-27 | Disposition: A | Discharge status: Home / Self Care | Payer: Medicaid Other | Source: Ambulatory Visit | Attending: Obstetrics & Gynecology | Admitting: Obstetrics & Gynecology

## 2022-12-27 ENCOUNTER — Ambulatory Visit (INDEPENDENT_AMBULATORY_CARE_PROVIDER_SITE_OTHER): Payer: Medicaid Other | Admitting: Obstetrics & Gynecology

## 2022-12-27 VITALS — BP 189/96 | HR 114 | Ht 65.0 in | Wt 267.2 lb

## 2022-12-27 DIAGNOSIS — N1831 Chronic kidney disease, stage 3a: Secondary | ICD-10-CM | POA: Diagnosis not present

## 2022-12-27 DIAGNOSIS — I129 Hypertensive chronic kidney disease with stage 1 through stage 4 chronic kidney disease, or unspecified chronic kidney disease: Secondary | ICD-10-CM | POA: Diagnosis not present

## 2022-12-27 DIAGNOSIS — K219 Gastro-esophageal reflux disease without esophagitis: Secondary | ICD-10-CM | POA: Diagnosis not present

## 2022-12-27 DIAGNOSIS — N939 Abnormal uterine and vaginal bleeding, unspecified: Secondary | ICD-10-CM | POA: Diagnosis not present

## 2022-12-27 DIAGNOSIS — R030 Elevated blood-pressure reading, without diagnosis of hypertension: Secondary | ICD-10-CM | POA: Diagnosis not present

## 2022-12-27 DIAGNOSIS — Z124 Encounter for screening for malignant neoplasm of cervix: Secondary | ICD-10-CM | POA: Diagnosis not present

## 2022-12-27 DIAGNOSIS — E1122 Type 2 diabetes mellitus with diabetic chronic kidney disease: Secondary | ICD-10-CM

## 2022-12-27 DIAGNOSIS — Z6841 Body Mass Index (BMI) 40.0 and over, adult: Secondary | ICD-10-CM

## 2022-12-27 MED ORDER — NORETHINDRONE ACETATE 5 MG PO TABS
5.0000 mg | ORAL_TABLET | Freq: Every day | ORAL | 2 refills | Status: DC
Start: 2022-12-27 — End: 2023-02-28

## 2022-12-27 NOTE — Progress Notes (Signed)
GYN VISIT Patient name: Patricia Stanton MRN [id number]  Date of birth: [birth year] Chief Complaint:   Gynecologic Exam (Wants to discuss a hysterectomy)  History of Present Illness:   Patricia Stanton is a 37 y.o. G1P1 female being seen today for the following concerns:  -HMB:  Menses are monthly- typically last for 1-2 weeks just depends.  Starts light then heavy for the remainder.  Will use a whole box each month.  Sometimes will need to change pad in under an hour with passage of large (~ plum-sized) clots.  +Dysmenorrhea- no NSAIDs.     On occasion will also have light intermenstrual spotting.  Previously started seen by Emelda Fear- given [place].  Notes some constipation due to gastritis issues.  Dyspareunia.  Notes h/o IUD- that required surgical removal-that led to tubal ligation and removal of cysts.  Per patient this was completed with Dr. Almond Lint at [facility].   C-section x 1 and BTL  Patient's last menstrual period was 12/06/2022 (within weeks).     12/27/2022    9:43 AM 10/10/2021    9:44 AM 08/15/2021   10:13 AM 03/13/2021   10:08 AM 12/20/2020   11:19 AM  Depression screen PHQ 2/9  Decreased Interest 1  0 1   Down, Depressed, Hopeless 1  0 1   PHQ - 2 Score 2  0 2   Altered sleeping 2  3 1    Tired, decreased energy 2  3 1    Change in appetite 3  0    Feeling bad or failure about yourself  0  0 0   Trouble concentrating 0  0 0   Moving slowly or fidgety/restless 0  0 1   Suicidal thoughts 0  0 0   PHQ-9 Score 9  6 5    Difficult doing work/chores   Not difficult at all Very difficult      Information is confidential and restricted. Go to Review Flowsheets to unlock data.     Review of Systems:   Pertinent items are noted in HPI Denies fever/chills, dizziness, headaches, visual disturbances, fatigue, shortness of breath, chest pain, abdominal pain, vomiting.  + constipation no diarrhea Pertinent History Reviewed:  Reviewed past medical,surgical, social, obstetrical and  family history.  Reviewed problem list, medications and allergies. Physical Assessment:   Vitals:   12/27/22 0936 12/27/22 1020  BP: (!) 165/97 (!) 189/96  Pulse: 98 (!) 114  Weight: 267 lb 3.2 oz (121.2 kg)   Height: 5\' 5"  (1.651 m)   Body mass index is 44.46 kg/m.       Physical Examination:   General appearance: alert, well appearing, and in no distress  Psych: mood appropriate, normal affect  Skin: warm & dry   Cardiovascular: normal heart rate noted  Respiratory: normal respiratory effort, no distress  Abdomen: obese, soft, no masses, notes tenderness in right lower quadrant with deep palpation  Pelvic: VULVA: normal appearing vulva with no masses, tenderness or lesions, VAGINA: normal appearing vagina with normal color and discharge, no lesions, CERVIX: normal appearing cervix without discharge or lesions, UTERUS: uterus is normal size, shape, consistency and nontender, ADNEXA: normal adnexa in size, nontender and no masses.  Pap and HPV collected.  Bimanual exam limited due to patient's body habitus  Extremities: no edema   Chaperone: Faith Rogue    Assessment & Plan:  1) AUB -During our conversation patient appeared very frustrated.  I encouraged her to consider conservative management however she reported that she just wants  it all out.  That she is sick of dealing with it.  Discussed that hysterectomy is a viable option however it is very important for her to understand all of her options. -Due to history of hypertension reviewed progesterone only options including pops, Depo, IUD.  Also discussed endometrial ablation.  Then reviewed hysterectomy -Explained to the patient that his surgery is performed to remove the uterus through several small incisions in the abdomen and that it will result in sterility.  I discussed the risks and benefits of the surgery, including, but not limited to bleeding, including the need for blood transfusion, infection, damage to surround organs  and tissues, damage to bladder, damage to ureters, causing kidney damage, and requiring additional procedures, damage to bowels, resulting in further surgery, postoperative pain, need for conversion to an open procedure.  Also reviewed potential risk of VTE, and other complications the course of which cannot be predicted or prevented, and death. -Pt desires to proceed with surgical intervention in July -for now agreeable to try POPs until then -encouraged pt to consider 2nd opinion if desired -Sterilization informed consent completed today  2) chronic HTN, DM, obesity  will also plan for surgical clearance prior to surgery -elevated BP today, likely due mood as she seemed angry/frustrated with today's process  -plan for pelvic US and preop appointment in June.  Will then likely plan to move forward with hysterectomy in July  3) preventative screening -Pap collected today reviewed ASCCP guidelines   Return for records from [facility] in [place] AND need pelvic US with Amber in early June and appt with me after.   Myna Hidalgo, DO Attending Obstetrician & Gynecologist, [facility] for [employer], [facility]

## 2023-01-01 ENCOUNTER — Other Ambulatory Visit: Payer: Self-pay | Admitting: Obstetrics & Gynecology

## 2023-01-01 DIAGNOSIS — B9689 Other specified bacterial agents as the cause of diseases classified elsewhere: Secondary | ICD-10-CM

## 2023-01-01 LAB — CYTOLOGY - PAP
Comment: NEGATIVE
Diagnosis: NEGATIVE
High risk HPV: NEGATIVE

## 2023-01-01 MED ORDER — METRONIDAZOLE 500 MG PO TABS
500.0000 mg | ORAL_TABLET | Freq: Two times a day (BID) | ORAL | 0 refills | Status: DC
Start: 2023-01-01 — End: 2023-01-03

## 2023-01-01 NOTE — Progress Notes (Signed)
Rx for BV 

## 2023-01-03 ENCOUNTER — Ambulatory Visit
Admission: RE | Admit: 2023-01-03 | Discharge: 2023-01-03 | Disposition: A | Discharge status: Home / Self Care | Payer: Medicaid Other | Source: Ambulatory Visit | Attending: Physician Assistant | Admitting: Physician Assistant

## 2023-01-03 VITALS — BP 121/80 | HR 101 | Temp 98.2°F | Resp 18

## 2023-01-03 DIAGNOSIS — J4521 Mild intermittent asthma with (acute) exacerbation: Secondary | ICD-10-CM | POA: Diagnosis not present

## 2023-01-03 DIAGNOSIS — J209 Acute bronchitis, unspecified: Secondary | ICD-10-CM

## 2023-01-03 DIAGNOSIS — H9201 Otalgia, right ear: Secondary | ICD-10-CM | POA: Diagnosis not present

## 2023-01-03 DIAGNOSIS — H6991 Unspecified Eustachian tube disorder, right ear: Secondary | ICD-10-CM | POA: Diagnosis not present

## 2023-01-03 MED ORDER — FLUTICASONE PROPIONATE 50 MCG/ACT NA SUSP: 1.0000 | Freq: Every day | NASAL | 0 refills | Status: DC

## 2023-01-03 MED ORDER — BUDESONIDE-FORMOTEROL FUMARATE 80-4.5 MCG/ACT IN AERO
2.0000 | INHALATION_SPRAY | Freq: Two times a day (BID) | RESPIRATORY_TRACT | 1 refills | Status: DC
Start: 2023-01-03 — End: 2023-11-04

## 2023-01-03 MED ORDER — PREDNISONE 20 MG PO TABS: 20.0000 mg | ORAL_TABLET | Freq: Every day | ORAL | 0 refills | Status: AC

## 2023-01-03 NOTE — ED Provider Notes (Signed)
[facility]    CSN: [id number] Arrival date & time: 01/03/23  0805      History   Chief Complaint Chief Complaint  Patient presents with   Dizziness    Right ear ache, light headed, sinus pressure - Entered by patient    HPI Patricia Stanton is a 37 y.o. female.   Patient presents today with a several day history of right otalgia.  She describes pain as a pressure in her ear, rated 5 on a 0-10 pain scale, no aggravating or alleviating factors identified.  She does have seasonal allergies and believes that this triggered her symptoms.  She has been compliant with her allergy medication including Claritin but continues to have some mild congestion and cough.  She has allergy induced asthma and is requesting a refill of her Symbicort as she is almost out of this medication.  She has been using her albuterol little bit more frequently since symptoms began but has plenty of this medication and symptoms adequately respond.  She reports associated dizziness yesterday but this is since resolved.  Describes this as a room spinning sensation.  She denies any recent antibiotics or steroids.  Denies any recent swimming or airplane travel.  She does have a history of diabetes but reports her blood sugars are adequately controlled and generally between 140 and 160.    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 03/25/2019   Mood disorder    Obesity    Ovarian cyst    Tobacco abuse     Patient Active Problem List   Diagnosis Date Noted   Abnormal iron saturation 08/06/2021   Chronic kidney disease, stage 3a 07/31/2021    Upper respiratory infection with cough and congestion 07/17/2021   Elevated ALT measurement 01/04/2021   Gastroesophageal reflux disease 11/09/2020   S/P lumbar laminectomy 05/21/2019   Simple chronic bronchitis 03/25/2019   Hypertension 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 03/25/2019   Chronic abdominal pain 03/25/2019   Leukocytosis 11/19/2018   Noncompliance 06/03/2017   Hyperlipidemia associated with type 2 diabetes mellitus 02/17/2017   Major depressive disorder, recurrent episode, moderate 02/03/2017   Vitamin D deficiency 11/20/2016   Diabetes mellitus type 2 in obese 11/20/2016   PTSD (post-traumatic stress disorder) 11/19/2016   History of hernia surgery 11/19/2016   Pulmonary nodule/lesion, solitary 11/19/2016   Mucosal abnormality of stomach    Non-intractable vomiting 12/19/2015   Constipation 12/19/2015   Tobacco abuse 06/06/2013   Morbid obesity 06/06/2013    Past Surgical History:  Procedure Laterality Date   CESAREAN SECTION  2008   [facility]   CHOLECYSTECTOMY N/A 11/25/2014   Procedure: LAPAROSCOPIC CHOLECYSTECTOMY;  Surgeon: Franky Macho Md, MD;  Location: [facility];  Service: General;  Laterality: N/A;   ESOPHAGOGASTRODUODENOSCOPY N/A 12/21/2015   Dr. Jena Gauss: normal esophagus, non-bleeding erosive gastropathy, normal second portion of duodenum. Reactive gastropathy/chemical gastritis, negative H.pylori   ESOPHAGOGASTRODUODENOSCOPY (EGD) WITH PROPOFOL N/A 11/23/2020    Surgeon: Corbin Ade, MD; normal esophagus, small hiatal hernia, normal  examined duodenum.   FEMUR FRACTURE SURGERY Left 1995   tree fell on her   HERNIA REPAIR     INCISIONAL HERNIA REPAIR N/A 01/29/2016   Procedure: Sherald Hess HERNIORRHAPHY WITH MESH;  Surgeon: Franky Macho, MD;  Location: [facility];  Service: General;  Laterality: N/A;   INSERTION OF MESH  01/29/2016   Procedure: INSERTION OF MESH;  Surgeon: Franky Macho, MD;  Location: [facility];  Service: General;;   KNEE  ARTHROSCOPY Left    LUMBAR LAMINECTOMY/DECOMPRESSION MICRODISCECTOMY Right 05/21/2019   Procedure: Microdiscectomy - Lumbar four-Lumbar five - right;  Surgeon: Tia Alert, MD;  Location[facility];  Service: Neurosurgery;  Laterality: Right;   TUBAL LIGATION      OB History   No obstetric history on file.      Home Medications    Prior to Admission medications   Medication Sig Start Date End Date Taking? Authorizing Provider  fluticasone (FLONASE) 50 MCG/ACT nasal spray Place 1 spray into both nostrils daily. 01/03/23  Yes Marielouise Amey K, PA-C  predniSONE (DELTASONE) 20 MG tablet Take 1 tablet (20 mg total) by mouth daily for 3 days. 01/03/23 01/06/23 Yes Jami Ohlin K, PA-C  albuterol (PROVENTIL) (2.5 MG/3ML) 0.083% nebulizer solution Take 3 mLs (2.5 mg total) by nebulization every 6 (six) hours as needed for wheezing or shortness of breath. 06/25/21   Junie Spencer, FNP  albuterol (VENTOLIN HFA) 108 (90 Base) MCG/ACT inhaler Inhale 2 puffs into the lungs every 6 (six) hours as needed for wheezing or shortness of breath. 07/17/21   Daryll Drown, NP  aspirin-acetaminophen-caffeine (EXCEDRIN MIGRAINE) [phone] MG tablet Take by mouth every 6 (six) hours as needed for headache.    [provider]  budesonide-formoterol (SYMBICORT) 80-4.5 MCG/ACT inhaler Inhale 2 puffs into the lungs 2 (two) times daily. 01/03/23   Clarkson Rosselli, Noberto Retort, PA-C  cetirizine (ZYRTEC) 10 MG tablet Take 1 tablet (10 mg total) by mouth daily. 06/08/20   Jannifer Rodney A, FNP  escitalopram (LEXAPRO) 20 MG tablet Take 1 tablet (20 mg total) by mouth daily. 06/20/21 09/18/21  Neysa Hotter, MD  gabapentin (NEURONTIN) 600 MG tablet Take 600 mg by mouth 3 (three) times daily. 11/05/20   [provider]  linaclotide Karlene Einstein) 72 MCG capsule Take 1 capsule (72 mcg total) by mouth daily before breakfast. 08/06/21   Letta Median, PA-C  lisinopril-hydrochlorothiazide (ZESTORETIC) 20-25 MG tablet Take 1 tablet by  mouth daily. 11/29/20   Daryll Drown, NP  lovastatin (MEVACOR) 20 MG tablet TAKE 1 TABLET(20 MG) BY MOUTH AT BEDTIME 09/03/21   Hawks, Neysa Bonito A, FNP  methocarbamol (ROBAXIN) 500 MG tablet Take 1 tablet (500 mg total) by mouth 3 (three) times daily as needed for muscle spasms. 06/28/21   Junie Spencer, FNP  norethindrone (AYGESTIN) 5 MG tablet Take 1 tablet (5 mg total) by mouth daily. 12/27/22 03/27/23  Myna Hidalgo, DO  ondansetron (ZOFRAN-ODT) 4 MG disintegrating tablet Take 1 tablet (4 mg total) by mouth every 8 (eight) hours as needed for nausea or vomiting. 12/17/22   Valentino Nose, NP  oxyCODONE-acetaminophen (PERCOCET/ROXICET) 5-325 MG tablet Take 1 tablet by mouth every 4 (four) hours as needed for moderate pain. 05/21/19   Tia Alert, MD  pantoprazole (PROTONIX) 40 MG tablet Take 1 tablet (40 mg total) by mouth 2 (two) times daily. 08/06/21   Letta Median, PA-C  prazosin (MINIPRESS) 1 MG capsule Take 3 capsules (3 mg total) by mouth at bedtime.  06/20/21 09/18/21  Neysa Hotter, MD  Semaglutide (OZEMPIC, 2 MG/DOSE, [place]) Inject into the skin.    [provider]    Family History Family History  Problem Relation Age of Onset   Hypertension Mother    Hyperlipidemia Mother    Fibromyalgia Mother    Other Mother        degenerative disc disease   Arthritis Mother    Kidney disease Mother    Diabetes Father    Hypertension Father    Hyperlipidemia Father    Heart disease Father 3   Heart attack Father    Stroke Father    Hyperlipidemia Brother    Hypertension Brother    Aneurysm Maternal Grandmother        AAA   Kidney disease Maternal Grandmother    Kidney disease Maternal Grandfather    Aneurysm Paternal Grandmother        AAA   Kidney disease Paternal Grandmother    Kidney disease Paternal Grandfather    Colon cancer Neg Hx    Inflammatory bowel disease Neg Hx     Social History Social History   Tobacco Use   Smoking status: Every Day     Packs/day: 0.50    Years: 11.00    Additional pack years: 0.00    Total pack years: 5.50    Types: Cigarettes    Start date: 09/17/2003   Smokeless tobacco: Never  Vaping Use   Vaping Use: Never used  Substance Use Topics   Alcohol use: No   Drug use: No     Allergies   Tramadol   Review of Systems Review of Systems  Constitutional:  Positive for activity change. Negative for appetite change, fatigue and fever.  HENT:  Positive for congestion and ear pain. Negative for ear discharge, sinus pressure, sneezing and sore throat.   Respiratory:  Positive for cough. Negative for shortness of breath.   Cardiovascular:  Negative for chest pain.  Gastrointestinal:  Negative for abdominal pain, diarrhea, nausea and vomiting.  Neurological:  Positive for dizziness. Negative for light-headedness and headaches.     Physical Exam Triage Vital Signs ED Triage Vitals [01/03/23 0817]  Enc Vitals Group     BP (!) 138/93     Pulse Rate (!) 116     Resp 18     Temp 98.2 F (36.8 C)     Temp Source Oral     SpO2 94 %     Weight      Height      Head Circumference      Peak Flow      Pain Score 5     Pain Loc      Pain Edu?      Excl. in GC?    No data found.  Updated Vital Signs BP 121/80 (BP Location: Right Arm)   Pulse (!) 101   Temp 98.2 F (36.8 C) (Oral)   Resp 18   LMP 12/06/2022 (Within Weeks)   SpO2 94%   Visual Acuity Right Eye Distance:   Left Eye Distance:   Bilateral Distance:    Right Eye Near:   Left Eye Near:    Bilateral Near:     Physical Exam Vitals reviewed.  Constitutional:      General: She is awake. She is not in acute distress.    Appearance: Normal appearance. She is well-developed. She is not ill-appearing.     Comments: Very pleasant female appears stated age in no  acute distress sitting comfortably in exam room  HENT:     Head: Normocephalic and atraumatic.     Right Ear: Ear canal and external ear normal. A middle ear effusion is  present. Tympanic membrane is not erythematous or bulging.     Left Ear: Tympanic membrane, ear canal and external ear normal. Tympanic membrane is not erythematous or bulging.     Nose: Congestion present.     Right Sinus: Frontal sinus tenderness present. No maxillary sinus tenderness.     Left Sinus: Frontal sinus tenderness present. No maxillary sinus tenderness.     Mouth/Throat:     Pharynx: Uvula midline. No oropharyngeal exudate or posterior oropharyngeal erythema.     Comments: Erythema and drainage present posterior oropharynx Cardiovascular:     Rate and Rhythm: Normal rate and regular rhythm.     Heart sounds: Normal heart sounds, S1 normal and S2 normal. No murmur heard. Pulmonary:     Effort: Pulmonary effort is normal.     Breath sounds: Wheezing present. No rhonchi or rales.     Comments: Scattered wheezing Psychiatric:        Behavior: Behavior is cooperative.      UC Treatments / Results  Labs (all labs ordered are listed, but only abnormal results are displayed) Labs Reviewed - No data to display  EKG   Radiology No results found.  Procedures Procedures (including critical care time)  Medications Ordered in UC Medications - No data to display  Initial Impression / Assessment and Plan / UC Course  I have reviewed the triage vital signs and the nursing notes.  Pertinent labs & imaging results that were available during my care of the patient were reviewed by me and considered in my medical decision making (see chart for details).     Patient was mildly tachycardic on intake but this improved on recheck.  She is otherwise well-appearing, afebrile, nontoxic, nontachycardic.  No evidence of acute infection of physical exam that would warrant initiation of antibiotics.  We discussed that seasonal allergies are likely contributing to eustachian tube dysfunction and asthma exacerbation.  She was encouraged to continue antihistamines as previously prescribed and  add fluticasone nasal spray.  This was sent to pharmacy.  She has plenty of her albuterol and was encouraged to use this as needed for acute shortness of breath and coughing fits.  Refill of Symbicort was sent to pharmacy with instruction to rinse her mouth following use of this medication in order to prevent thrush.  She was started on a short course of prednisone 20 mg daily for 3 days.  We discussed that this can cause hyperglycemia and it is important that she pushes fluids and avoids carbohydrates while on this medication.  She is to monitor her blood sugar regularly and if elevated above 200 she should be evaluated by our clinic or primary care.  If her symptoms or not improving within a week she needs to return for reevaluation.  Discussed that if anything worsens and she has chest pain, shortness of breath, fever, weakness, nausea/vomiting interfering with oral intake she needs to be seen immediately.  Strict return precautions given.  She declined work excuse note.  Final Clinical Impressions(s) / UC Diagnoses   Final diagnoses:  Mild intermittent extrinsic asthma with acute exacerbation  Acute otalgia, right  Dysfunction of right eustachian tube     Discharge Instructions      I believe that your allergies and congestion have led to something called eustachian  tube dysfunction causing the pressure in your ear.  I am also aware that your allergies are irritating your asthma.  I have called in a refill of your Symbicort.  Use this as prescribed.  Make sure to rinse your mouth following use of this medication in order to prevent associated thrush.  Continue your Claritin and add Flonase to your medication regimen.  Take prednisone 20 mg for 3 days.  This can raise your blood sugar so avoid carbohydrates and make sure that you drink plenty of fluid with this medication.  If your symptoms or not improving within a few days or if anything worsens you need to be seen immediately.     ED  Prescriptions     Medication Sig Dispense Auth. Provider   budesonide-formoterol (SYMBICORT) 80-4.5 MCG/ACT inhaler Inhale 2 puffs into the lungs 2 (two) times daily. 1 each Danisha Brassfield K, PA-C   fluticasone (FLONASE) 50 MCG/ACT nasal spray Place 1 spray into both nostrils daily. 16 g Wakisha Alberts K, PA-C   predniSONE (DELTASONE) 20 MG tablet Take 1 tablet (20 mg total) by mouth daily for 3 days. 3 tablet Kiera Hussey, Noberto Retort, PA-C      PDMP not reviewed this encounter.   Jeani Hawking, PA-C 01/03/23 1610

## 2023-01-03 NOTE — Discharge Instructions (Signed)
I believe that your allergies and congestion have led to something called eustachian tube dysfunction causing the pressure in your ear.  I am also aware that your allergies are irritating your asthma.  I have called in a refill of your Symbicort.  Use this as prescribed.  Make sure to rinse your mouth following use of this medication in order to prevent associated thrush.  Continue your Claritin and add Flonase to your medication regimen.  Take prednisone 20 mg for 3 days.  This can raise your blood sugar so avoid carbohydrates and make sure that you drink plenty of fluid with this medication.  If your symptoms or not improving within a few days or if anything worsens you need to be seen immediately.

## 2023-01-03 NOTE — ED Triage Notes (Signed)
Right ear pressure then ear aching and started to feel dizzy since yesterday.  Has been taking Claritin

## 2023-01-14 DIAGNOSIS — F17210 Nicotine dependence, cigarettes, uncomplicated: Secondary | ICD-10-CM | POA: Diagnosis not present

## 2023-01-14 DIAGNOSIS — D509 Iron deficiency anemia, unspecified: Secondary | ICD-10-CM | POA: Diagnosis not present

## 2023-01-14 DIAGNOSIS — E1143 Type 2 diabetes mellitus with diabetic autonomic (poly)neuropathy: Secondary | ICD-10-CM | POA: Diagnosis not present

## 2023-01-14 DIAGNOSIS — F419 Anxiety disorder, unspecified: Secondary | ICD-10-CM | POA: Diagnosis not present

## 2023-01-14 DIAGNOSIS — Z0130 Encounter for examination of blood pressure without abnormal findings: Secondary | ICD-10-CM | POA: Diagnosis not present

## 2023-01-14 DIAGNOSIS — F32A Depression, unspecified: Secondary | ICD-10-CM | POA: Diagnosis not present

## 2023-01-14 DIAGNOSIS — Z3200 Encounter for pregnancy test, result unknown: Secondary | ICD-10-CM | POA: Diagnosis not present

## 2023-01-14 DIAGNOSIS — Z79899 Other long term (current) drug therapy: Secondary | ICD-10-CM | POA: Diagnosis not present

## 2023-01-14 DIAGNOSIS — E2740 Unspecified adrenocortical insufficiency: Secondary | ICD-10-CM | POA: Diagnosis not present

## 2023-01-14 DIAGNOSIS — M549 Dorsalgia, unspecified: Secondary | ICD-10-CM | POA: Diagnosis not present

## 2023-01-14 DIAGNOSIS — Z6841 Body Mass Index (BMI) 40.0 and over, adult: Secondary | ICD-10-CM | POA: Diagnosis not present

## 2023-01-14 DIAGNOSIS — M539 Dorsopathy, unspecified: Secondary | ICD-10-CM | POA: Diagnosis not present

## 2023-01-14 DIAGNOSIS — Z760 Encounter for issue of repeat prescription: Secondary | ICD-10-CM | POA: Diagnosis not present

## 2023-02-12 DIAGNOSIS — F419 Anxiety disorder, unspecified: Secondary | ICD-10-CM | POA: Diagnosis not present

## 2023-02-12 DIAGNOSIS — G8929 Other chronic pain: Secondary | ICD-10-CM | POA: Diagnosis not present

## 2023-02-12 DIAGNOSIS — E559 Vitamin D deficiency, unspecified: Secondary | ICD-10-CM | POA: Diagnosis not present

## 2023-02-12 DIAGNOSIS — M549 Dorsalgia, unspecified: Secondary | ICD-10-CM | POA: Diagnosis not present

## 2023-02-12 DIAGNOSIS — Z6841 Body Mass Index (BMI) 40.0 and over, adult: Secondary | ICD-10-CM | POA: Diagnosis not present

## 2023-02-12 DIAGNOSIS — F17210 Nicotine dependence, cigarettes, uncomplicated: Secondary | ICD-10-CM | POA: Diagnosis not present

## 2023-02-12 DIAGNOSIS — Z0130 Encounter for examination of blood pressure without abnormal findings: Secondary | ICD-10-CM | POA: Diagnosis not present

## 2023-02-12 DIAGNOSIS — F32A Depression, unspecified: Secondary | ICD-10-CM | POA: Diagnosis not present

## 2023-02-12 DIAGNOSIS — D509 Iron deficiency anemia, unspecified: Secondary | ICD-10-CM | POA: Diagnosis not present

## 2023-02-12 DIAGNOSIS — E119 Type 2 diabetes mellitus without complications: Secondary | ICD-10-CM | POA: Diagnosis not present

## 2023-02-12 DIAGNOSIS — Z3200 Encounter for pregnancy test, result unknown: Secondary | ICD-10-CM | POA: Diagnosis not present

## 2023-02-12 DIAGNOSIS — D539 Nutritional anemia, unspecified: Secondary | ICD-10-CM | POA: Diagnosis not present

## 2023-02-12 DIAGNOSIS — M539 Dorsopathy, unspecified: Secondary | ICD-10-CM | POA: Diagnosis not present

## 2023-02-12 DIAGNOSIS — Z79899 Other long term (current) drug therapy: Secondary | ICD-10-CM | POA: Diagnosis not present

## 2023-02-17 ENCOUNTER — Other Ambulatory Visit: Payer: Medicaid Other

## 2023-02-17 ENCOUNTER — Ambulatory Visit: Payer: 59 | Admitting: Obstetrics & Gynecology

## 2023-02-28 ENCOUNTER — Ambulatory Visit
Admission: RE | Admit: 2023-02-28 | Discharge: 2023-02-28 | Disposition: A | Discharge status: Home / Self Care | Payer: Medicaid Other | Source: Ambulatory Visit | Attending: Urgent Care | Admitting: Urgent Care

## 2023-02-28 VITALS — BP 159/98 | HR 104 | Temp 98.9°F | Resp 18

## 2023-02-28 DIAGNOSIS — H65193 Other acute nonsuppurative otitis media, bilateral: Secondary | ICD-10-CM

## 2023-02-28 DIAGNOSIS — E119 Type 2 diabetes mellitus without complications: Secondary | ICD-10-CM | POA: Diagnosis not present

## 2023-02-28 DIAGNOSIS — F17200 Nicotine dependence, unspecified, uncomplicated: Secondary | ICD-10-CM | POA: Diagnosis not present

## 2023-02-28 DIAGNOSIS — J309 Allergic rhinitis, unspecified: Secondary | ICD-10-CM | POA: Diagnosis not present

## 2023-02-28 DIAGNOSIS — Z794 Long term (current) use of insulin: Secondary | ICD-10-CM

## 2023-02-28 MED ORDER — CEFDINIR 300 MG PO CAPS: 300.0000 mg | ORAL_CAPSULE | Freq: Two times a day (BID) | ORAL | 0 refills | Status: DC

## 2023-02-28 NOTE — Discharge Instructions (Addendum)
For diabetes or elevated blood sugar, please make sure you are limiting and avoiding starchy, carbohydrate foods like pasta, breads, sweet breads, pastry, rice, potatoes, desserts. These foods can elevate your blood sugar. Also, limit and avoid drinks that contain a lot of sugar such as sodas, sweet teas, fruit juices.  Drinking plain water will be much more helpful, try 80 ounces of water daily.  It is okay to flavor your water naturally by cutting cucumber, lemon, mint or lime, placing it in a picture with water and drinking it over a period of 24-48 hours as long as it remains refrigerated.  For elevated blood pressure, make sure you are monitoring salt in your diet.  Do not eat restaurant foods and limit processed foods at home. I highly recommend you prepare and cook your own foods at home.  Processed foods include things like frozen meals, pre-seasoned meats and dinners, deli meats, canned foods as these foods contain a high amount of sodium/salt.  Make sure you are paying attention to sodium labels on foods you buy at the grocery store. Buy your spices separately such as garlic powder, onion powder, cumin, cayenne, parsley flakes so that you can avoid seasonings that contain salt. However, salt-free seasonings are available and can be used, an example is Mrs. Dash and includes a lot of different mixtures that do not contain salt.  Lastly, when cooking using oils that are healthier for you is important. This includes olive oil, avocado oil, canola oil. We have discussed a lot of foods to avoid but below is a list of foods that can be very healthy to use in your diet whether it is for diabetes, cholesterol, high blood pressure, or in general healthy eating.  Salads - kale, spinach, cabbage, spring mix, arugula Fruits - avocadoes, berries (blueberries, raspberries, blackberries), apples, oranges, pomegranate, grapefruit, kiwi Vegetables - asparagus, cauliflower, broccoli, green beans, brussel sprouts,  bell peppers, beets; stay away from or limit starchy vegetables like potatoes, carrots, peas Other general foods - kidney beans, egg whites, almonds, walnuts, sunflower seeds, pumpkin seeds, fat free yogurt, almond milk, flax seeds, quinoa, oats  Meat - It is better to eat lean meats and limit your red meat including pork to once a week.  Wild caught fish, chicken breast are good options as they tend to be leaner sources of good protein. Still be mindful of the sodium labels for the meats you buy.  DO NOT EAT ANY FOODS ON THIS LIST THAT YOU ARE ALLERGIC TO. For more specific needs, I highly recommend consulting a dietician or nutritionist but this can definitely be a good starting point.  

## 2023-02-28 NOTE — ED Triage Notes (Signed)
Dizziness, ear pain, neck pain, sinus pressure since Monday.  States she feels back from neck that comes up over head to face.

## 2023-02-28 NOTE — ED Provider Notes (Signed)
[facility]  Note:  This document was prepared using Dragon voice recognition software and may include unintentional dictation errors.  MRN: [id number] DOB: 03-26-1986  Subjective:   Patricia Stanton is a 37 y.o. female presenting for 5-day history of acute onset persistent dizziness, vertigo, right ear pain, neck pain that radiates to over the top of her head to the frontal aspect.  Has had a lot of sinus pain over the frontal sinuses.  Reports sinus congestion and sinus drainage.  Has had an occasional cough.  No chest pain, shortness of breath or wheezing.  Patient has a history of allergic rhinitis and ran out of her Flonase.  She does take Zyrtec.  Has a history of hypertension and is compliant with her blood pressure medications.  She is trying to make significant dietary modifications.  Blood sugars are still high ranging in the 150s or higher.  She is on Ozempic.  No insulin.  No history of stroke.  Patient still smokes, does not have pack per day.  No current facility-administered medications for this encounter.  Current Outpatient Medications:    albuterol (PROVENTIL) (2.5 MG/3ML) 0.083% nebulizer solution, Take 3 mLs (2.5 mg total) by nebulization every 6 (six) hours as needed for wheezing or shortness of breath., Disp: 150 mL, Rfl: 1   albuterol (VENTOLIN HFA) 108 (90 Base) MCG/ACT inhaler, Inhale 2 puffs into the lungs every 6 (six) hours as needed for wheezing or shortness of breath., Disp: 18 g, Rfl: 6   aspirin-acetaminophen-caffeine (EXCEDRIN MIGRAINE) 250-250-65 MG tablet, Take by mouth every 6 (six) hours as needed for headache., Disp: , Rfl:    budesonide-formoterol (SYMBICORT) 80-4.5 MCG/ACT inhaler, Inhale 2 puffs into the lungs 2 (two) times daily., Disp: 1 each, Rfl: 1   cetirizine (ZYRTEC) 10 MG tablet, Take 1 tablet (10 mg total) by mouth daily., Disp: 90 tablet, Rfl: 1   gabapentin (NEURONTIN) 600 MG tablet, Take 600 mg by mouth 3 (three) times daily.,  Disp: , Rfl:    linaclotide (LINZESS) 72 MCG capsule, Take 1 capsule (72 mcg total) by mouth daily before breakfast., Disp: 30 capsule, Rfl: 3   lisinopril-hydrochlorothiazide (ZESTORETIC) 20-25 MG tablet, Take 1 tablet by mouth daily., Disp: 90 tablet, Rfl: 1   lovastatin (MEVACOR) 20 MG tablet, TAKE 1 TABLET(20 MG) BY MOUTH AT BEDTIME, Disp: 90 tablet, Rfl: 0   methocarbamol (ROBAXIN) 500 MG tablet, Take 1 tablet (500 mg total) by mouth 3 (three) times daily as needed for muscle spasms., Disp: 90 tablet, Rfl: 2   ondansetron (ZOFRAN-ODT) 4 MG disintegrating tablet, Take 1 tablet (4 mg total) by mouth every 8 (eight) hours as needed for nausea or vomiting., Disp: 20 tablet, Rfl: 0   oxyCODONE-acetaminophen (PERCOCET/ROXICET) 5-325 MG tablet, Take 1 tablet by mouth every 4 (four) hours as needed for moderate pain., Disp: 30 tablet, Rfl: 0   pantoprazole (PROTONIX) 40 MG tablet, Take 1 tablet (40 mg total) by mouth 2 (two) times daily., Disp: 180 tablet, Rfl: 3   prazosin (MINIPRESS) 1 MG capsule, Take 3 capsules (3 mg total) by mouth at bedtime., Disp: 270 capsule, Rfl: 0   Semaglutide (OZEMPIC, 2 MG/DOSE, [place]), Inject into the skin., Disp: , Rfl:    Allergies  Allergen Reactions   Tramadol Other (See Comments)    Exacerbates her asthma    Nsaids     Past Medical History:  Diagnosis Date   Adrenal tumor    Anxiety    Arthritis    left  knee   Asthma    Chronic abdominal pain    Chronic headaches    Depression    Diabetes mellitus type 2 in obese 11/20/2016   Endometriosis    Gastroesophageal reflux disease 11/09/2020   HLD (hyperlipidemia) 02/17/2017   Hypertension    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 Jena Gauss: normal esophagus, non-bleeding erosive gastropathy, normal second portion of duodenum. Reactive gastropathy/chemical gastritis, negative H.pylori   ESOPHAGOGASTRODUODENOSCOPY (EGD) WITH PROPOFOL N/A 11/23/2020    Surgeon: Corbin Ade, MD; normal esophagus, small hiatal hernia, normal examined duodenum.   FEMUR FRACTURE SURGERY Left 1995   tree fell on her   HERNIA REPAIR     INCISIONAL HERNIA REPAIR N/A 01/29/2016   Procedure: Sherald Hess HERNIORRHAPHY WITH MESH;  Surgeon: Franky Macho, MD;  Location: [facility];  Service: General;  Laterality: N/A;   INSERTION OF MESH  01/29/2016   Procedure: INSERTION OF MESH;  Surgeon: Franky Macho, MD;  Location: [facility];  Service: General;;   KNEE ARTHROSCOPY Left    LUMBAR LAMINECTOMY/DECOMPRESSION MICRODISCECTOMY Right 05/21/2019   Procedure: Microdiscectomy - Lumbar four-Lumbar five - right;  Surgeon: Tia Alert, MD;  Location: [facility];  Service: Neurosurgery;  Laterality: Right;   TUBAL LIGATION      Family History  Problem Relation Age of Onset   Hypertension Mother    Hyperlipidemia Mother    Fibromyalgia Mother    Other Mother        degenerative disc disease   Arthritis Mother    Kidney disease Mother    Diabetes Father    Hypertension Father    Hyperlipidemia Father    Heart disease Father 28   Heart attack Father    Stroke Father    Hyperlipidemia Brother    Hypertension Brother    Aneurysm Maternal Grandmother        AAA   Kidney disease Maternal Grandmother    Kidney disease Maternal Grandfather    Aneurysm Paternal Grandmother        AAA   Kidney disease Paternal Grandmother    Kidney disease Paternal Grandfather    Colon cancer Neg Hx    Inflammatory bowel disease Neg Hx     Social History   Tobacco Use   Smoking status: Every Day    Packs/day: 0.50    Years:  11.00    Additional pack years: 0.00    Total pack years: 5.50    Types: Cigarettes    Start date: 09/17/2003   Smokeless tobacco: Never  Vaping Use   Vaping Use: Never used  Substance Use Topics   Alcohol use: No   Drug use: No    ROS   Objective:   Vitals: BP (!) 159/98 (BP Location: Right Arm)   Pulse (!) 104   Temp 98.9 F (37.2 C) (Oral)   Resp 18   LMP 02/09/2023 (Exact Date)   SpO2 96%   Physical Exam Constitutional:      General: She is not in acute distress.  Appearance: Normal appearance. She is well-developed and normal weight. She is not ill-appearing, toxic-appearing or diaphoretic.  HENT:     Head: Normocephalic and atraumatic.     Right Ear: Ear canal and external ear normal. No drainage or tenderness. A middle ear effusion is present. There is no impacted cerumen. Tympanic membrane is erythematous and bulging.     Left Ear: Ear canal and external ear normal. No drainage or tenderness. A middle ear effusion is present. There is no impacted cerumen. Tympanic membrane is erythematous. Tympanic membrane is not bulging.     Nose: Congestion present. No rhinorrhea.     Mouth/Throat:     Mouth: Mucous membranes are moist. No oral lesions.     Pharynx: No pharyngeal swelling, oropharyngeal exudate, posterior oropharyngeal erythema or uvula swelling.     Tonsils: No tonsillar exudate or tonsillar abscesses.  Eyes:     General: No scleral icterus.       Right eye: No discharge.        Left eye: No discharge.     Extraocular Movements: Extraocular movements intact.     Right eye: Normal extraocular motion.     Left eye: Normal extraocular motion.     Conjunctiva/sclera: Conjunctivae normal.  Cardiovascular:     Rate and Rhythm: Normal rate and regular rhythm.     Heart sounds: Normal heart sounds. No murmur heard.    No friction rub. No gallop.  Pulmonary:     Effort: Pulmonary effort is normal. No respiratory distress.     Breath sounds: No stridor. No  wheezing, rhonchi or rales.  Chest:     Chest wall: No tenderness.  Musculoskeletal:     Cervical back: Normal range of motion and neck supple.  Lymphadenopathy:     Cervical: No cervical adenopathy.  Skin:    General: Skin is warm and dry.  Neurological:     General: No focal deficit present.     Mental Status: She is alert and oriented to person, place, and time.     Cranial Nerves: No cranial nerve deficit.     Motor: No weakness.     Coordination: Coordination normal.     Gait: Gait normal.     Comments: Negative Romberg and pronator drift.  Psychiatric:        Mood and Affect: Mood normal.        Behavior: Behavior normal.     Assessment and Plan :   PDMP not reviewed this encounter.  1. Other non-recurrent acute nonsuppurative otitis media of both ears   2. Allergic rhinitis, unspecified seasonality, unspecified trigger   3. Smoker   4. Type 2 diabetes mellitus treated without insulin (HCC)    No signs of an acute encephalopathy.  I did discuss the possibility of TIA, stroke.  However, physical exam findings are consistent with a bilateral otitis media which is likely concurrent with a sinus infection and allergic rhinitis flare.  Unfortunately due to her diabetes and high blood pressure cannot use pseudoephedrine and prednisone safely at this stage.  Should her blood pressure be better controlled, I do recommend trialing low-dose pseudoephedrine.  Maintain Zyrtec.  Use supportive care otherwise including Tylenol.  No NSAIDs.  Maintain strict ER precautions.  Creatinine clearance was calculated to be 107 mL/min based off her latest creatinine level.  This is conducive to using cefdinir for otitis media.   Wallis Bamberg, [place] 02/28/23 1858

## 2023-03-14 DIAGNOSIS — Z79899 Other long term (current) drug therapy: Secondary | ICD-10-CM | POA: Diagnosis not present

## 2023-04-14 DIAGNOSIS — Z79899 Other long term (current) drug therapy: Secondary | ICD-10-CM | POA: Diagnosis not present

## 2023-05-16 DIAGNOSIS — Z79899 Other long term (current) drug therapy: Secondary | ICD-10-CM | POA: Diagnosis not present

## 2023-05-16 DIAGNOSIS — E119 Type 2 diabetes mellitus without complications: Secondary | ICD-10-CM | POA: Diagnosis not present

## 2023-05-20 DIAGNOSIS — M5442 Lumbago with sciatica, left side: Secondary | ICD-10-CM | POA: Diagnosis not present

## 2023-05-20 DIAGNOSIS — M9905 Segmental and somatic dysfunction of pelvic region: Secondary | ICD-10-CM | POA: Diagnosis not present

## 2023-05-20 DIAGNOSIS — M9903 Segmental and somatic dysfunction of lumbar region: Secondary | ICD-10-CM | POA: Diagnosis not present

## 2023-05-20 DIAGNOSIS — M9902 Segmental and somatic dysfunction of thoracic region: Secondary | ICD-10-CM | POA: Diagnosis not present

## 2023-05-22 ENCOUNTER — Other Ambulatory Visit (HOSPITAL_COMMUNITY): Payer: Self-pay | Admitting: Nurse Practitioner

## 2023-05-22 ENCOUNTER — Encounter (HOSPITAL_COMMUNITY): Payer: Self-pay | Admitting: Nurse Practitioner

## 2023-05-22 DIAGNOSIS — N63 Unspecified lump in unspecified breast: Secondary | ICD-10-CM

## 2023-05-23 DIAGNOSIS — M9903 Segmental and somatic dysfunction of lumbar region: Secondary | ICD-10-CM | POA: Diagnosis not present

## 2023-05-23 DIAGNOSIS — M9905 Segmental and somatic dysfunction of pelvic region: Secondary | ICD-10-CM | POA: Diagnosis not present

## 2023-05-23 DIAGNOSIS — M9902 Segmental and somatic dysfunction of thoracic region: Secondary | ICD-10-CM | POA: Diagnosis not present

## 2023-05-23 DIAGNOSIS — M5442 Lumbago with sciatica, left side: Secondary | ICD-10-CM | POA: Diagnosis not present

## 2023-06-12 ENCOUNTER — Ambulatory Visit (HOSPITAL_COMMUNITY)
Admission: RE | Admit: 2023-06-12 | Discharge: 2023-06-12 | Disposition: A | Discharge status: Home / Self Care | Payer: Medicaid Other | Source: Ambulatory Visit | Attending: Nurse Practitioner | Admitting: Nurse Practitioner

## 2023-06-12 ENCOUNTER — Other Ambulatory Visit (HOSPITAL_COMMUNITY): Payer: Self-pay | Admitting: Nurse Practitioner

## 2023-06-12 DIAGNOSIS — N63 Unspecified lump in unspecified breast: Secondary | ICD-10-CM

## 2023-06-17 ENCOUNTER — Ambulatory Visit (HOSPITAL_COMMUNITY)
Admission: RE | Admit: 2023-06-17 | Discharge: 2023-06-17 | Disposition: A | Discharge status: Home / Self Care | Payer: Medicaid Other | Source: Ambulatory Visit | Attending: Nurse Practitioner | Admitting: Nurse Practitioner

## 2023-06-17 ENCOUNTER — Encounter (HOSPITAL_COMMUNITY): Payer: Self-pay

## 2023-06-17 DIAGNOSIS — N63 Unspecified lump in unspecified breast: Secondary | ICD-10-CM

## 2023-06-17 DIAGNOSIS — D242 Benign neoplasm of left breast: Secondary | ICD-10-CM | POA: Diagnosis not present

## 2023-06-17 DIAGNOSIS — N61 Mastitis without abscess: Secondary | ICD-10-CM | POA: Diagnosis not present

## 2023-06-17 HISTORY — PX: BREAST BIOPSY: SHX20

## 2023-06-17 MED ORDER — LIDOCAINE-EPINEPHRINE (PF) 1 %-1:200000 IJ SOLN
10.0000 mL | Freq: Once | INTRAMUSCULAR | Status: AC
  Administered 2023-06-17: 30 mL via INTRADERMAL

## 2023-06-17 MED ORDER — LIDOCAINE HCL (PF) 2 % IJ SOLN: 10.0000 mL | Freq: Once | INTRAMUSCULAR | Status: DC

## 2023-06-17 MED ORDER — LIDOCAINE HCL (PF) 2 % IJ SOLN
20.0000 mL | Freq: Once | INTRAMUSCULAR | Status: AC
  Administered 2023-06-17: 30 mL

## 2023-06-17 MED ORDER — LIDOCAINE HCL (PF) 2 % IJ SOLN
INTRAMUSCULAR | Status: AC
  Filled 2023-06-17: qty 20

## 2023-06-17 NOTE — Progress Notes (Signed)
PT tolerated three left breast biopsies well today with NAD noted. PT verbalized understanding of discharge instructions. PT ambulated back to the mammogram area this time and given ice packs to use.

## 2023-06-23 LAB — SURGICAL PATHOLOGY

## 2023-07-03 ENCOUNTER — Telehealth: Payer: Self-pay | Admitting: *Deleted

## 2023-07-03 NOTE — Telephone Encounter (Signed)
Received call from patient [phone]~ telephone.   Patient referred to [facility] for left breast abscess at 12 o'clock retroareolar site. Requested earlier appointment. Patient is currently scheduled for 07/07/2023.  Patient states that since US Guided Breast Core Needle Biopsy on 06/17/2023, area has been open and draining purulent drainage. Patient had stated that area was draining during appointment scheduling. At that time, patient was advised to follow up with PCP or go to ER for evaluation of possible infection. Patient has declined to go to PCP/ ER.  Patient today states that drainage has increased significantly and is thick, yellow to green with foul odor noted. States that she is changing her bandage 2- 3x daily. Reports that skin of breast surround biopsy site is red and irritated from bandage adhesive. States that adhesive is now tearing skin and causing her to bleed. Patient noted to be understandably concerned and upset, but affect noted to be belligerent.   Again advised to follow up with PCP for infection and current skin irritation or to go to ER for evaluation. Advised to switch bandages to non-adhesive bandages to protect skin. Patient states that she is unable to use non-adherent bandages as the bandage will not stay in place to collect the drainage.   Attempted to give patient advice on different types of bandages that will be gentler on skin. Patient reported that she will keep her appointment on Tuesday, 07/03/2023 with Dr. Henreitta Leber. Patient hung up on caller.   Received call from [facility] with Mammography at [facility]. Reports that patient called radiology requesting assistance to be seen. States that patient was noted to be aggravated and belligerent as well.   Writer advised Corrie Dandy to recommend to patient to follow up with PCP or to go to ER for evaluation.   Reports that patient requested new referral for general surgery. States that patient was advised that a new referral can  be placed, but the patient would likely not be able to be seen before Tuesday appointment.

## 2023-07-08 ENCOUNTER — Encounter (HOSPITAL_COMMUNITY): Payer: Medicaid Other

## 2023-07-08 ENCOUNTER — Ambulatory Visit (INDEPENDENT_AMBULATORY_CARE_PROVIDER_SITE_OTHER): Payer: Medicaid Other | Admitting: General Surgery

## 2023-07-08 ENCOUNTER — Encounter: Payer: Self-pay | Admitting: General Surgery

## 2023-07-08 ENCOUNTER — Other Ambulatory Visit (HOSPITAL_COMMUNITY): Payer: Medicaid Other

## 2023-07-08 DIAGNOSIS — N611 Abscess of the breast and nipple: Secondary | ICD-10-CM

## 2023-07-08 NOTE — H&P (Signed)
[facility] History and Physical  Reason for Referral:Left breast abscess  Referring Physician: Dr. Derinda Late   Chief Complaint   New Patient (Initial Visit)     Patricia Stanton is a 37 y.o. female.  HPI: Patricia Stanton is a 37 yo has had a recent left breast abscess with swelling and redness. She was started on antibiotic and referred to mammogram/ [place]. She underwent a biopsy and this revealed benign findings and concern for continued abscess. She has not had any I&D or aspiration of the abscess. She says the area is draining actively from her biopsy site. Prior to draining from this site it was draining from the nipple. The material was purulent in nature.   She has no family history of any breast cancer. She does have paternal grandmother that had a cyst in her breast removed and a drain tube in place.   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) 03/25/2019   Mood disorder (HCC)    Obesity    Ovarian cyst    Tobacco abuse     Past Surgical History:  Procedure Laterality Date   BREAST BIOPSY Left 06/17/2023   [place] LT BREAST BX W LOC DEV 1ST LESION IMG BX SPEC US GUIDE 06/17/2023 [facility]   BREAST BIOPSY Left 06/17/2023   [place] LT BREAST BX W LOC DEV EA ADD LESION IMG BX SPEC US GUIDE 06/17/2023 [facility]   BREAST BIOPSY Left 06/17/2023   [place] LT BREAST BX W LOC DEV EA ADD LESION IMG BX SPEC US GUIDE 06/17/2023 [facility]   CESAREAN SECTION  2008   [facility]   CHOLECYSTECTOMY N/A 11/25/2014   Procedure: LAPAROSCOPIC CHOLECYSTECTOMY;  Surgeon: Franky Macho Md, MD;  Location: [facility];  Service: General;   Laterality: N/A;   ESOPHAGOGASTRODUODENOSCOPY N/A 12/21/2015   Dr. Jena Gauss: normal esophagus, non-bleeding erosive gastropathy, normal second portion of duodenum. Reactive gastropathy/chemical gastritis, negative H.pylori   ESOPHAGOGASTRODUODENOSCOPY (EGD) WITH PROPOFOL N/A 11/23/2020    Surgeon: Corbin Ade, MD; normal esophagus, small hiatal hernia, normal examined duodenum.   FEMUR FRACTURE SURGERY Left 1995   tree fell on her   HERNIA REPAIR     INCISIONAL HERNIA REPAIR N/A 01/29/2016   Procedure: Sherald Hess HERNIORRHAPHY WITH MESH;  Surgeon: Franky Macho, MD;  Location: [facility];  Service: General;  Laterality: N/A;   INSERTION OF MESH  01/29/2016   Procedure: INSERTION OF MESH;  Surgeon: Franky Macho, MD;  Location: [facility];  Service: General;;   KNEE ARTHROSCOPY Left    LUMBAR LAMINECTOMY/DECOMPRESSION MICRODISCECTOMY Right 05/21/2019   Procedure: Microdiscectomy - Lumbar four-Lumbar five - right;  Surgeon: Tia Alert, MD;  Location: [facility];  Service: Neurosurgery;  Laterality: Right;   TUBAL LIGATION      Family History  Problem Relation Age of Onset   Hypertension Mother    Hyperlipidemia Mother    Fibromyalgia Mother    Other Mother        degenerative disc disease   Arthritis Mother    Kidney disease Mother    Diabetes  Father    Hypertension Father    Hyperlipidemia Father    Heart disease Father 55   Heart attack Father    Stroke Father    Hyperlipidemia Brother    Hypertension Brother    Aneurysm Maternal Grandmother        AAA   Kidney disease Maternal Grandmother    Kidney disease Maternal Grandfather    Aneurysm Paternal Grandmother        AAA   Kidney disease Paternal Grandmother    Kidney disease Paternal Grandfather    Colon cancer Neg Hx    Inflammatory bowel disease Neg Hx     Social History   Tobacco Use   Smoking status: Every Day    Current packs/day: 0.50    Average packs/day: 0.5 packs/day for 19.8 years (9.9 ttl pk-yrs)    Types: Cigarettes     Start date: 09/17/2003   Smokeless tobacco: Never  Vaping Use   Vaping status: Never Used  Substance Use Topics   Alcohol use: No   Drug use: No    Medications: I have reviewed the patient's current medications. Allergies as of 07/08/2023       Reactions   Tramadol Other (See Comments)   Exacerbates her asthma    Nsaids         Medication List        Accurate as of July 08, 2023 10:35 AM. If you have any questions, ask your nurse or doctor.          STOP taking these medications    cefdinir 300 MG capsule Commonly known as: OMNICEF Stopped by: Lucretia Roers       TAKE these medications    albuterol (2.5 MG/3ML) 0.083% nebulizer solution Commonly known as: PROVENTIL Take 3 mLs (2.5 mg total) by nebulization every 6 (six) hours as needed for wheezing or shortness of breath.   albuterol 108 (90 Base) MCG/ACT inhaler Commonly known as: VENTOLIN HFA Inhale 2 puffs into the lungs every 6 (six) hours as needed for wheezing or shortness of breath.   aspirin-acetaminophen-caffeine 250-250-65 MG tablet Commonly known as: EXCEDRIN MIGRAINE Take by mouth every 6 (six) hours as needed for headache.   budesonide-formoterol 80-4.5 MCG/ACT inhaler Commonly known as: SYMBICORT Inhale 2 puffs into the lungs 2 (two) times daily.   cetirizine 10 MG tablet Commonly known as: ZYRTEC Take 1 tablet (10 mg total) by mouth daily.   gabapentin 600 MG tablet Commonly known as: NEURONTIN Take 600 mg by mouth 3 (three) times daily.   linaclotide 72 MCG capsule Commonly known as: Linzess Take 1 capsule (72 mcg total) by mouth daily before breakfast.   lisinopril-hydrochlorothiazide 20-25 MG tablet Commonly known as: ZESTORETIC Take 1 tablet by mouth daily.   lovastatin 20 MG tablet Commonly known as: MEVACOR TAKE 1 TABLET(20 MG) BY MOUTH AT BEDTIME   methocarbamol 500 MG tablet Commonly known as: ROBAXIN Take 1 tablet (500 mg total) by mouth 3 (three) times  daily as needed for muscle spasms.   ondansetron 4 MG disintegrating tablet Commonly known as: ZOFRAN-ODT Take 1 tablet (4 mg total) by mouth every 8 (eight) hours as needed for nausea or vomiting.   oxyCODONE-acetaminophen 5-325 MG tablet Commonly known as: PERCOCET/ROXICET Take 1 tablet by mouth every 4 (four) hours as needed for moderate pain.   OZEMPIC (2 MG/DOSE) Muse Inject into the skin.   pantoprazole 40 MG tablet Commonly known as: PROTONIX Take 1 tablet (40 mg total) by mouth  2 (two) times daily.   prazosin 1 MG capsule Commonly known as: MINIPRESS Take 3 capsules (3 mg total) by mouth at bedtime.         ROS:  A comprehensive review of systems was negative except for: Integument/breast: positive for left breast pain, redness, retracted nipple, purulent drainage from the nipple  Musculoskeletal: positive for back pain, neck pain, and joint pain  Blood pressure 115/77, pulse (!) 103, temperature 98.2 F (36.8 C), temperature source Oral, resp. rate 16, height 5\' 5"  (1.651 m), weight 260 lb (117.9 kg), SpO2 95%. Physical Exam Vitals reviewed.  Constitutional:      Appearance: She is obese.  HENT:     Head: Normocephalic.     Nose: Nose normal.  Cardiovascular:     Rate and Rhythm: Normal rate and regular rhythm.  Pulmonary:     Effort: Pulmonary effort is normal.     Breath sounds: Normal breath sounds.  Chest:     Comments: Left breast redness and swelling, tender around the areola, lateral aspect biopsy site with purulent drainage, painful  Abdominal:     General: There is no distension.     Palpations: Abdomen is soft.  Musculoskeletal:        General: Normal range of motion.  Skin:    General: Skin is warm.  Neurological:     General: No focal deficit present.     Mental Status: She is alert and oriented to person, place, and time.  Psychiatric:        Mood and Affect: Mood normal.        Behavior: Behavior normal.        Thought Content: Thought  content normal.        Judgment: Judgment normal.     Results: CLINICAL DATA:  37 year old female complaining of a palpable abnormality in the retroareolar region of the left breast with nipple retraction.   EXAM: DIGITAL DIAGNOSTIC BILATERAL MAMMOGRAM WITH TOMOSYNTHESIS AND CAD; ULTRASOUND LEFT BREAST LIMITED   TECHNIQUE: Bilateral digital diagnostic mammography and breast tomosynthesis was performed. The images were evaluated with computer-aided detection. ; Targeted ultrasound examination of the left breast was performed.   COMPARISON:  None available.   ACR Breast Density Category b: There are scattered areas of fibroglandular density.   FINDINGS: No suspicious mass or malignant type microcalcifications identified n the right breast.   There is a spiculated 4.5 cm mass in the subareolar region of the left breast associated with diffuse skin thickening. In the upper-outer quadrant of the left breast is a well-circumscribed 1.2 cm mass. There are no malignant type microcalcifications.   On physical exam, the left nipple is retracted and there is a discrete palpable mass in the 12 o'clock retroareolar region of the breast.   Targeted ultrasound is performed, showing an irregular hypoechoic mass in the 12 o'clock retroareolar region of the breast measuring 4.5 x 2.1 x 3.9 cm. There is a well-circumscribed hypoechoic mass in the left breast at 2 o'clock 11 cm from the nipple measuring 1.2 x 0.6 x 1.1 cm. Sonographic evaluation of the left axilla shows a lymph node with cortical irregularity with maximal cortical thickening measuring 4 mm. It is indeterminate.   IMPRESSION: 1. Suspicious 4.5 cm mass in the 12 o'clock retroareolar region of the left breast.   2. Indeterminate 1.2 cm well-circumscribed hypoechoic mass in the 2 o'clock region of the left breast 11 cm from the nipple.   3.  Indeterminate left axillary lymph node.  4.  No evidence of malignancy in the right  breast.   RECOMMENDATION: Ultrasound-guided core biopsies of the mass in the 12 o'clock retroareolar region of the left breast, the mass in the 2 o'clock region of the left breast and left axillary lymph node is recommended.   I have discussed the findings and recommendations with the patient. If applicable, a reminder letter will be sent to the patient regarding the next appointment.   BI-RADS CATEGORY  5: Highly suggestive of malignancy.     Electronically Signed   By: Baird Lyons M.D.   On: 06/12/2023 11:50  CLINICAL DATA:  37 year old female complaining of a palpable abnormality in the retroareolar region of the left breast with nipple retraction.   EXAM: DIGITAL DIAGNOSTIC BILATERAL MAMMOGRAM WITH TOMOSYNTHESIS AND CAD; ULTRASOUND LEFT BREAST LIMITED   TECHNIQUE: Bilateral digital diagnostic mammography and breast tomosynthesis was performed. The images were evaluated with computer-aided detection. ; Targeted ultrasound examination of the left breast was performed.   COMPARISON:  None available.   ACR Breast Density Category b: There are scattered areas of fibroglandular density.   FINDINGS: No suspicious mass or malignant type microcalcifications identified in the right breast.   There is a spiculated 4.5 cm mass in the subareolar region of the left breast associated with diffuse skin thickening. In the upper-outer quadrant of the left breast is a well-circumscribed 1.2 cm mass. There are no malignant type microcalcifications.   On physical exam, the left nipple is retracted and there is a discrete palpable mass in the 12 o'clock retroareolar region of the breast.   Targeted ultrasound is performed, showing an irregular hypoechoic mass in the 12 o'clock retroareolar region of the breast measuring 4.5 x 2.1 x 3.9 cm. There is a well-circumscribed hypoechoic mass in the left breast at 2 o'clock 11 cm from the nipple measuring 1.2 x 0.6 x 1.1 cm. Sonographic  evaluation of the left axilla shows a lymph node with cortical irregularity with maximal cortical thickening measuring 4 mm. It is indeterminate.   IMPRESSION: 1. Suspicious 4.5 cm mass in the 12 o'clock retroareolar region of the left breast.   2. Indeterminate 1.2 cm well-circumscribed hypoechoic mass in the 2 o'clock region of the left breast 11 cm from the nipple.   3.  Indeterminate left axillary lymph node.   4.  No evidence of malignancy in the right breast.   RECOMMENDATION: Ultrasound-guided core biopsies of the mass in the 12 o'clock retroareolar region of the left breast, the mass in the 2 o'clock region of the left breast and left axillary lymph node is recommended.   I have discussed the findings and recommendations with the patient. If applicable, a reminder letter will be sent to the patient regarding the next appointment.   BI-RADS CATEGORY  5: Highly suggestive of malignancy.     Electronically Signed   By: Baird Lyons M.D.   On: 06/12/2023 11:50   ADDENDUM REPORT: 06/20/2023 14:48   ADDENDUM: PATHOLOGY revealed: Site A. BREAST, LEFT, 12 OC RETROAREOLAR MASS, NEEDLE CORE BIOPSY: Benign breast tissue with acute and chronic inflammation, abscess formation and fibroblastic reactions. Negative for atypia or malignancy.   Pathology results are CONCORDANT with imaging findings, per Dr. Edwin Cap with possible excision recommended.   PATHOLOGY revealed: Site B. BREAST, LEFT, 2 OC MASS, NEEDLE CORE BIOPSY: Fibroadenoma. Negative for atypia or malignancy.   Pathology results are CONCORDANT with imaging findings, per Dr. Edwin Cap.   PATHOLOGY revealed: Site C. LYMPH NODE,  LEFT AXILLA, BIOPSY: Benign reactive lymph node with tattoo pigments. Negative for metastatic carcinoma.   COMMENT: Immunohistochemical stain for CK AE1/AE3 was performed on A1 and does not show evidence of abnormal epithelial proliferation. There is no evidence of atypia or  malignancy.   Pathology results are CONCORDANT with imaging findings, per Dr. Edwin Cap.   Pathology results and recommendations below were discussed with patient by telephone on 06/20/2023. Patient reported biopsy site within normal limits with slight tenderness at the site. Post biopsy care instructions were reviewed, questions were answered and my direct phone number was provided to patient. Patient was instructed to call [facility] Mammography Department if any concerns or questions arise related to the biopsy.   RECOMMENDATION: Surgical consultation for consideration of excision of Site A only, given previous antibiotic treatment. Request for surgical consultation was relayed to Ellin Mayhew RT at [facility] Mammography Department by Randa Lynn RN on 06/20/2023.   Pathology results reported by Randa Lynn RN on 06/20/2023.     Electronically Signed   By: Edwin Cap M.D.   On: 06/20/2023 14:48  CLINICAL DATA:  37 year old female presents for ultrasound-guided core biopsy of a 4.5 cm mass in the left breast at 12 o'clock retroareolar, ultrasound-guided core biopsy of a 1.2 cm mass in the left breast at the 2 o'clock position and ultrasound-guided core biopsy of an indeterminate lymph node in the left axilla.   EXAM: ULTRASOUND GUIDED LEFT BREAST CORE NEEDLE BIOPSY   ULTRASOUND-GUIDED LEFT AXILLA CORE NEEDLE BIOPSY   COMPARISON:  Previous exam(s).   PROCEDURE: I met with the patient and we discussed the procedure of ultrasound-guided biopsy, including benefits and alternatives. We discussed the high likelihood of a successful procedure. We discussed the risks of the procedure, including infection, bleeding, tissue injury, clip migration, and inadequate sampling. Informed written consent was given. The usual time-out protocol was performed immediately prior to the procedure.   1. LEFT BREAST 12 O'CLOCK RETROAREOLAR MASS: Lesion  quadrant: UPPER-OUTER   Using sterile technique and 1% Lidocaine as local anesthetic, underdirect ultrasound visualization, a 14 gauge spring-loaded device was used to perform biopsy of the mass in the left breast at 12 o'clock retroareolar using a lateral to medial approach. At the conclusion of the procedure a ribbon shaped tissue marker clip was deployed into the biopsy cavity. Follow up 2 view mammogram was performed and dictated separately.   2.  LEFT BREAST 2 O'CLOCK MASS: Lesion quadrant: UPPER OUTER   Using sterile technique and 1% Lidocaine as local anesthetic, under direct ultrasound visualization, a 14 gauge spring-loaded device was used to perform biopsy of the mass in the left breast at the 2o'clock position using a lateral to medial approach. At the conclusion of the procedure a wing shaped tissue marker clip was deployed into the biopsy cavity. Follow up 2 view mammogram was performed and dictated separately.   3.  LEFT AXILLA LYMPH NODE: Lesion quadrant: UPPER OUTER   Using sterile technique and 1% Lidocaine as local anesthetic, under direct ultrasound visualization, a 14 gauge spring-loaded device was used to perform biopsy of the lymph node with mild cortical thickening in the left axilla using a inferolateral to superomedial approach. At the conclusion of the procedure a HydroMARK spiral shaped tissue marker clip was deployed into the biopsy cavity. Follow up 2 view mammogram was performed and dictated separately.   IMPRESSION: 1. Ultrasound-guided core biopsy of the mass in the left breast at 12 o'clock  retroareolar, at site of ribbon shaped biopsy marking clip.   2. Ultrasound-guided core biopsy of the mass in the left breast at 2 o'clock 11 cm from nipple, at site of wing shaped biopsy marking clip.   3. Ultrasound-guided core biopsy of the lymph node in the left axilla, at site of [facility] spiral biopsy marking clip.   Electronically Signed: By: Edwin Cap  M.D. On: 06/17/2023 14:08   Assessment & Plan:  Irmalinda Radermacher Wachsmuth is a 37 y.o. female with a left breast abscess that has never been drained or aspirated. I offered her drainage today in the clinic but given there discomfort I explained that the local may not work due to the pH of the skin from the infection. Discussed incision and drainage possible drain placement, possible biopsy/ excision of tissue. Given that she has never had drainage I do not think complete excision of the cavity is indicated as this would remove a large area of tissue unnecessarily. I think we start with I&D. I also talked her about smoking and that this increases the risk of breast infections. Discussed risk of bleeding, infection, recurrence, needing a drain and packing the wound.   She has been off ozempic for a few weeks.   All questions were answered to the satisfaction of the patient and family.   Lucretia Roers 07/08/2023, 10:35 AM

## 2023-07-08 NOTE — Progress Notes (Signed)
[facility] History and Physical  Reason for Referral:Left breast abscess  Referring Physician: Dr. Derinda Late   Chief Complaint   New Patient (Initial Visit)     Patricia Stanton is a 37 y.o. female.  HPI: Patricia Stanton is a 37 yo has had a recent left breast abscess with swelling and redness. She was started on antibiotic and referred to mammogram/ [place]. She underwent a biopsy and this revealed benign findings and concern for continued abscess. She has not had any I&D or aspiration of the abscess. She says the area is draining actively from her biopsy site. Prior to draining from this site it was draining from the nipple. The material was purulent in nature.   She has no family history of any breast cancer. She does have paternal grandmother that had a cyst in her breast removed and a drain tube in place.   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) 03/25/2019   Mood disorder (HCC)    Obesity    Ovarian cyst    Tobacco abuse     Past Surgical History:  Procedure Laterality Date   BREAST BIOPSY Left 06/17/2023   [place] LT BREAST BX W LOC DEV 1ST LESION IMG BX SPEC US GUIDE 06/17/2023 [facility]   BREAST BIOPSY Left 06/17/2023   [place] LT BREAST BX W LOC DEV EA ADD LESION IMG BX SPEC US GUIDE 06/17/2023 [facility]   BREAST BIOPSY Left 06/17/2023   [place] LT BREAST BX W LOC DEV EA ADD LESION IMG BX SPEC US GUIDE 06/17/2023 [facility]   CESAREAN SECTION  2008   [facility]   CHOLECYSTECTOMY N/A 11/25/2014   Procedure: LAPAROSCOPIC CHOLECYSTECTOMY;  Surgeon: Franky Macho Md, MD;  Location: [facility];  Service: General;   Laterality: N/A;   ESOPHAGOGASTRODUODENOSCOPY N/A 12/21/2015   Dr. Jena Gauss: normal esophagus, non-bleeding erosive gastropathy, normal second portion of duodenum. Reactive gastropathy/chemical gastritis, negative H.pylori   ESOPHAGOGASTRODUODENOSCOPY (EGD) WITH PROPOFOL N/A 11/23/2020    Surgeon: Corbin Ade, MD; normal esophagus, small hiatal hernia, normal examined duodenum.   FEMUR FRACTURE SURGERY Left 1995   tree fell on her   HERNIA REPAIR     INCISIONAL HERNIA REPAIR N/A 01/29/2016   Procedure: Sherald Hess HERNIORRHAPHY WITH MESH;  Surgeon: Franky Macho, MD;  Location: [facility];  Service: General;  Laterality: N/A;   INSERTION OF MESH  01/29/2016   Procedure: INSERTION OF MESH;  Surgeon: Franky Macho, MD;  Location: [facility];  Service: General;;   KNEE ARTHROSCOPY Left    LUMBAR LAMINECTOMY/DECOMPRESSION MICRODISCECTOMY Right 05/21/2019   Procedure: Microdiscectomy - Lumbar four-Lumbar five - right;  Surgeon: Tia Alert, MD;  Location: [facility] OR;  Service: Neurosurgery;  Laterality: Right;   TUBAL LIGATION      Family History  Problem Relation Age of Onset   Hypertension Mother    Hyperlipidemia Mother    Fibromyalgia Mother    Other Mother        degenerative disc disease   Arthritis Mother    Kidney disease Mother    Diabetes  Father    Hypertension Father    Hyperlipidemia Father    Heart disease Father 55   Heart attack Father    Stroke Father    Hyperlipidemia Brother    Hypertension Brother    Aneurysm Maternal Grandmother        AAA   Kidney disease Maternal Grandmother    Kidney disease Maternal Grandfather    Aneurysm Paternal Grandmother        AAA   Kidney disease Paternal Grandmother    Kidney disease Paternal Grandfather    Colon cancer Neg Hx    Inflammatory bowel disease Neg Hx     Social History   Tobacco Use   Smoking status: Every Day    Current packs/day: 0.50    Average packs/day: 0.5 packs/day for 19.8 years (9.9 ttl pk-yrs)    Types: Cigarettes     Start date: 09/17/2003   Smokeless tobacco: Never  Vaping Use   Vaping status: Never Used  Substance Use Topics   Alcohol use: No   Drug use: No    Medications: I have reviewed the patient's current medications. Allergies as of 07/08/2023       Reactions   Tramadol Other (See Comments)   Exacerbates her asthma    Nsaids         Medication List        Accurate as of July 08, 2023 10:35 AM. If you have any questions, ask your nurse or doctor.          STOP taking these medications    cefdinir 300 MG capsule Commonly known as: OMNICEF Stopped by: Lucretia Roers       TAKE these medications    albuterol (2.5 MG/3ML) 0.083% nebulizer solution Commonly known as: PROVENTIL Take 3 mLs (2.5 mg total) by nebulization every 6 (six) hours as needed for wheezing or shortness of breath.   albuterol 108 (90 Base) MCG/ACT inhaler Commonly known as: VENTOLIN HFA Inhale 2 puffs into the lungs every 6 (six) hours as needed for wheezing or shortness of breath.   aspirin-acetaminophen-caffeine 250-250-65 MG tablet Commonly known as: EXCEDRIN MIGRAINE Take by mouth every 6 (six) hours as needed for headache.   budesonide-formoterol 80-4.5 MCG/ACT inhaler Commonly known as: SYMBICORT Inhale 2 puffs into the lungs 2 (two) times daily.   cetirizine 10 MG tablet Commonly known as: ZYRTEC Take 1 tablet (10 mg total) by mouth daily.   gabapentin 600 MG tablet Commonly known as: NEURONTIN Take 600 mg by mouth 3 (three) times daily.   linaclotide 72 MCG capsule Commonly known as: Linzess Take 1 capsule (72 mcg total) by mouth daily before breakfast.   lisinopril-hydrochlorothiazide 20-25 MG tablet Commonly known as: ZESTORETIC Take 1 tablet by mouth daily.   lovastatin 20 MG tablet Commonly known as: MEVACOR TAKE 1 TABLET(20 MG) BY MOUTH AT BEDTIME   methocarbamol 500 MG tablet Commonly known as: ROBAXIN Take 1 tablet (500 mg total) by mouth 3 (three) times  daily as needed for muscle spasms.   ondansetron 4 MG disintegrating tablet Commonly known as: ZOFRAN-ODT Take 1 tablet (4 mg total) by mouth every 8 (eight) hours as needed for nausea or vomiting.   oxyCODONE-acetaminophen 5-325 MG tablet Commonly known as: PERCOCET/ROXICET Take 1 tablet by mouth every 4 (four) hours as needed for moderate pain.   OZEMPIC (2 MG/DOSE) Muse Inject into the skin.   pantoprazole 40 MG tablet Commonly known as: PROTONIX Take 1 tablet (40 mg total) by mouth  2 (two) times daily.   prazosin 1 MG capsule Commonly known as: MINIPRESS Take 3 capsules (3 mg total) by mouth at bedtime.         ROS:  A comprehensive review of systems was negative except for: Integument/breast: positive for left breast pain, redness, retracted nipple, purulent drainage from the nipple  Musculoskeletal: positive for back pain, neck pain, and joint pain  Blood pressure 115/77, pulse (!) 103, temperature 98.2 F (36.8 C), temperature source Oral, resp. rate 16, height 5\' 5"  (1.651 m), weight 260 lb (117.9 kg), SpO2 95%. Physical Exam Vitals reviewed.  Constitutional:      Appearance: She is obese.  HENT:     Head: Normocephalic.     Nose: Nose normal.  Cardiovascular:     Rate and Rhythm: Normal rate and regular rhythm.  Pulmonary:     Effort: Pulmonary effort is normal.     Breath sounds: Normal breath sounds.  Chest:     Comments: Left breast redness and swelling, tender around the areola, lateral aspect biopsy site with purulent drainage, painful  Abdominal:     General: There is no distension.     Palpations: Abdomen is soft.  Musculoskeletal:        General: Normal range of motion.  Skin:    General: Skin is warm.  Neurological:     General: No focal deficit present.     Mental Status: She is alert and oriented to person, place, and time.  Psychiatric:        Mood and Affect: Mood normal.        Behavior: Behavior normal.        Thought Content: Thought  content normal.        Judgment: Judgment normal.     Results: CLINICAL DATA:  37 year old female complaining of a palpable abnormality in the retroareolar region of the left breast with nipple retraction.   EXAM: DIGITAL DIAGNOSTIC BILATERAL MAMMOGRAM WITH TOMOSYNTHESIS AND CAD; ULTRASOUND LEFT BREAST LIMITED   TECHNIQUE: Bilateral digital diagnostic mammography and breast tomosynthesis was performed. The images were evaluated with computer-aided detection. ; Targeted ultrasound examination of the left breast was performed.   COMPARISON:  None available.   ACR Breast Density Category b: There are scattered areas of fibroglandular density.   FINDINGS: No suspicious mass or malignant type microcalcifications identified n the right breast.   There is a spiculated 4.5 cm mass in the subareolar region of the left breast associated with diffuse skin thickening. In the upper-outer quadrant of the left breast is a well-circumscribed 1.2 cm mass. There are no malignant type microcalcifications.   On physical exam, the left nipple is retracted and there is a discrete palpable mass in the 12 o'clock retroareolar region of the breast.   Targeted ultrasound is performed, showing an irregular hypoechoic mass in the 12 o'clock retroareolar region of the breast measuring 4.5 x 2.1 x 3.9 cm. There is a well-circumscribed hypoechoic mass in the left breast at 2 o'clock 11 cm from the nipple measuring 1.2 x 0.6 x 1.1 cm. Sonographic evaluation of the left axilla shows a lymph node with cortical irregularity with maximal cortical thickening measuring 4 mm. It is indeterminate.   IMPRESSION: 1. Suspicious 4.5 cm mass in the 12 o'clock retroareolar region of the left breast.   2. Indeterminate 1.2 cm well-circumscribed hypoechoic mass in the 2 o'clock region of the left breast 11 cm from the nipple.   3.  Indeterminate left axillary lymph node.  4.  No evidence of malignancy in the right  breast.   RECOMMENDATION: Ultrasound-guided core biopsies of the mass in the 12 o'clock retroareolar region of the left breast, the mass in the 2 o'clock region of the left breast and left axillary lymph node is recommended.   I have discussed the findings and recommendations with the patient. If applicable, a reminder letter will be sent to the patient regarding the next appointment.   BI-RADS CATEGORY  5: Highly suggestive of malignancy.     Electronically Signed   By: Baird Lyons M.D.   On: 06/12/2023 11:50  CLINICAL DATA:  37 year old female complaining of a palpable abnormality in the retroareolar region of the left breast with nipple retraction.   EXAM: DIGITAL DIAGNOSTIC BILATERAL MAMMOGRAM WITH TOMOSYNTHESIS AND CAD; ULTRASOUND LEFT BREAST LIMITED   TECHNIQUE: Bilateral digital diagnostic mammography and breast tomosynthesis was performed. The images were evaluated with computer-aided detection. ; Targeted ultrasound examination of the left breast was performed.   COMPARISON:  None available.   ACR Breast Density Category b: There are scattered areas of fibroglandular density.   FINDINGS: No suspicious mass or malignant type microcalcifications identified in the right breast.   There is a spiculated 4.5 cm mass in the subareolar region of the left breast associated with diffuse skin thickening. In the upper-outer quadrant of the left breast is a well-circumscribed 1.2 cm mass. There are no malignant type microcalcifications.   On physical exam, the left nipple is retracted and there is a discrete palpable mass in the 12 o'clock retroareolar region of the breast.   Targeted ultrasound is performed, showing an irregular hypoechoic mass in the 12 o'clock retroareolar region of the breast measuring 4.5 x 2.1 x 3.9 cm. There is a well-circumscribed hypoechoic mass in the left breast at 2 o'clock 11 cm from the nipple measuring 1.2 x 0.6 x 1.1 cm. Sonographic  evaluation of the left axilla shows a lymph node with cortical irregularity with maximal cortical thickening measuring 4 mm. It is indeterminate.   IMPRESSION: 1. Suspicious 4.5 cm mass in the 12 o'clock retroareolar region of the left breast.   2. Indeterminate 1.2 cm well-circumscribed hypoechoic mass in the 2 o'clock region of the left breast 11 cm from the nipple.   3.  Indeterminate left axillary lymph node.   4.  No evidence of malignancy in the right breast.   RECOMMENDATION: Ultrasound-guided core biopsies of the mass in the 12 o'clock retroareolar region of the left breast, the mass in the 2 o'clock region of the left breast and left axillary lymph node is recommended.   I have discussed the findings and recommendations with the patient. If applicable, a reminder letter will be sent to the patient regarding the next appointment.   BI-RADS CATEGORY  5: Highly suggestive of malignancy.     Electronically Signed   By: Baird Lyons M.D.   On: 06/12/2023 11:50   ADDENDUM REPORT: 06/20/2023 14:48   ADDENDUM: PATHOLOGY revealed: Site A. BREAST, LEFT, 12 OC RETROAREOLAR MASS, NEEDLE CORE BIOPSY: Benign breast tissue with acute and chronic inflammation, abscess formation and fibroblastic reactions. Negative for atypia or malignancy.   Pathology results are CONCORDANT with imaging findings, per Dr. Edwin Cap with possible excision recommended.   PATHOLOGY revealed: Site B. BREAST, LEFT, 2 OC MASS, NEEDLE CORE BIOPSY: Fibroadenoma. Negative for atypia or malignancy.   Pathology results are CONCORDANT with imaging findings, per Dr. Edwin Cap.   PATHOLOGY revealed: Site C. LYMPH NODE,  LEFT AXILLA, BIOPSY: Benign reactive lymph node with tattoo pigments. Negative for metastatic carcinoma.   COMMENT: Immunohistochemical stain for CK AE1/AE3 was performed on A1 and does not show evidence of abnormal epithelial proliferation. There is no evidence of atypia or  malignancy.   Pathology results are CONCORDANT with imaging findings, per Dr. Edwin Cap.   Pathology results and recommendations below were discussed with patient by telephone on 06/20/2023. Patient reported biopsy site within normal limits with slight tenderness at the site. Post biopsy care instructions were reviewed, questions were answered and my direct phone number was provided to patient. Patient was instructed to call [facility] Mammography Department if any concerns or questions arise related to the biopsy.   RECOMMENDATION: Surgical consultation for consideration of excision of Site A only, given previous antibiotic treatment. Request for surgical consultation was relayed to Ellin Mayhew RT at [facility] Mammography Department by Randa Lynn RN on 06/20/2023.   Pathology results reported by Randa Lynn RN on 06/20/2023.     Electronically Signed   By: Edwin Cap M.D.   On: 06/20/2023 14:48  CLINICAL DATA:  37 year old female presents for ultrasound-guided core biopsy of a 4.5 cm mass in the left breast at 12 o'clock retroareolar, ultrasound-guided core biopsy of a 1.2 cm mass in the left breast at the 2 o'clock position and ultrasound-guided core biopsy of an indeterminate lymph node in the left axilla.   EXAM: ULTRASOUND GUIDED LEFT BREAST CORE NEEDLE BIOPSY   ULTRASOUND-GUIDED LEFT AXILLA CORE NEEDLE BIOPSY   COMPARISON:  Previous exam(s).   PROCEDURE: I met with the patient and we discussed the procedure of ultrasound-guided biopsy, including benefits and alternatives. We discussed the high likelihood of a successful procedure. We discussed the risks of the procedure, including infection, bleeding, tissue injury, clip migration, and inadequate sampling. Informed written consent was given. The usual time-out protocol was performed immediately prior to the procedure.   1. LEFT BREAST 12 O'CLOCK RETROAREOLAR MASS: Lesion  quadrant: UPPER-OUTER   Using sterile technique and 1% Lidocaine as local anesthetic, underdirect ultrasound visualization, a 14 gauge spring-loaded device was used to perform biopsy of the mass in the left breast at 12 o'clock retroareolar using a lateral to medial approach. At the conclusion of the procedure a ribbon shaped tissue marker clip was deployed into the biopsy cavity. Follow up 2 view mammogram was performed and dictated separately.   2.  LEFT BREAST 2 O'CLOCK MASS: Lesion quadrant: UPPER OUTER   Using sterile technique and 1% Lidocaine as local anesthetic, under direct ultrasound visualization, a 14 gauge spring-loaded device was used to perform biopsy of the mass in the left breast at the 2o'clock position using a lateral to medial approach. At the conclusion of the procedure a wing shaped tissue marker clip was deployed into the biopsy cavity. Follow up 2 view mammogram was performed and dictated separately.   3.  LEFT AXILLA LYMPH NODE: Lesion quadrant: UPPER OUTER   Using sterile technique and 1% Lidocaine as local anesthetic, under direct ultrasound visualization, a 14 gauge spring-loaded device was used to perform biopsy of the lymph node with mild cortical thickening in the left axilla using a inferolateral to superomedial approach. At the conclusion of the procedure a HydroMARK spiral shaped tissue marker clip was deployed into the biopsy cavity. Follow up 2 view mammogram was performed and dictated separately.   IMPRESSION: 1. Ultrasound-guided core biopsy of the mass in the left breast at 12 o'clock  retroareolar, at site of ribbon shaped biopsy marking clip.   2. Ultrasound-guided core biopsy of the mass in the left breast at 2 o'clock 11 cm from nipple, at site of wing shaped biopsy marking clip.   3. Ultrasound-guided core biopsy of the lymph node in the left axilla, at site of [facility] spiral biopsy marking clip.   Electronically Signed: By: Edwin Cap  M.D. On: 06/17/2023 14:08   Assessment & Plan:  Irmalinda Radermacher Wachsmuth is a 37 y.o. female with a left breast abscess that has never been drained or aspirated. I offered her drainage today in the clinic but given there discomfort I explained that the local may not work due to the pH of the skin from the infection. Discussed incision and drainage possible drain placement, possible biopsy/ excision of tissue. Given that she has never had drainage I do not think complete excision of the cavity is indicated as this would remove a large area of tissue unnecessarily. I think we start with I&D. I also talked her about smoking and that this increases the risk of breast infections. Discussed risk of bleeding, infection, recurrence, needing a drain and packing the wound.   She has been off ozempic for a few weeks.   All questions were answered to the satisfaction of the patient and family.   Lucretia Roers 07/08/2023, 10:35 AM

## 2023-07-08 NOTE — Patient Instructions (Addendum)
Incision and drainage of breast abscess.  Plan for possible drain placement. Will possibly need to pack this area.  Only planning on incision and drainage at this time as planning on any large excision will result in more tissue being removed than needed due to the active infection and the abscess could still recur.

## 2023-07-09 NOTE — Patient Instructions (Signed)
Patricia Stanton  07/09/2023     @PREFPERIOPPHARMACY @   Your procedure is scheduled on  07/11/2023.   Report to [facility] at  1200  P.M.   Call this number if you have problems the morning of surgery:  [phone]  If you experience any cold or flu symptoms such as cough, fever, chills, shortness of breath, etc. between now and your scheduled surgery, please notify us at the above number.   Remember:         Your last dose of ozempic should have been on 07/03/2023.       DO NOT take any medications for diabetes the morning of your procedure.      Use your nebulizer and your inhaler before you come and bring your rescue inhaler with you.    Do not eat after midnight.    You may drink clear liquids until 1000 am on 07/11/2023.    Clear liquids allowed are:                    Water, Carbonated beverages (diabetics please choose diet or no sugar options), Black Coffee Only (No creamer, milk or cream, including half & half and powdered creamer), and Clear Sports drink (No red color; diabetics please choose diet or no sugar options)    Take these medicines the morning of surgery with A SIP OF WATER          gabapentin, robaxin (if needed), zofran (if needed), oxycodone (if needed)      Do not wear jewelry, make-up or nail polish, including gel polish,  artificial nails, or any other type of covering on natural nails (fingers and  toes).  Do not wear lotions, powders, or perfumes, or deodorant.  Do not shave 48 hours prior to surgery.  Men may shave face and neck.  Do not bring valuables to the hospital.  [facility] is not responsible for any belongings or valuables.  Contacts, dentures or bridgework may not be worn into surgery.  Leave your suitcase in the car.  After surgery it may be brought to your room.  For patients admitted to the hospital, discharge time will be determined by your treatment team.  Patients discharged the day of surgery will not be  allowed to drive home and must have someone with them for 24 hours .    Special instructions:   DO NOT smoke tobacco or vape for 24 hours before your procedure.  Please read over the following fact sheets that you were given. Coughing and Deep Breathing, Surgical Site Infection Prevention, Anesthesia Post-op Instructions, and Care and Recovery After Surgery        Incision and Drainage, Care After After incision and drainage, it is common to have: Pain or discomfort around the incision site. Blood, fluid, or pus (drainage) from the incision. Redness and firm skin around the incision site. Follow these instructions at home: Medicines Take over-the-counter and prescription medicines only as told by your health care provider. If you were prescribed antibiotics, take them as told by your provider. Do not stop using the antibiotic even if you start to feel better. Do not apply creams, ointments, or liquids unless you have been told to by your provider. Wound care Follow instructions from your provider about how to take care of your wound. Make sure you: Wash your hands with soap and water for at least 20 seconds before and after you change your bandage (dressing). If  soap and water are not available, use hand sanitizer. Change your dressing and any packing as told by your provider. If the dressing is dry or stuck when you try to remove it, moisten or wet it with saline or water. This will help you remove it without harming your skin or tissues. If your wound is packed, leave it in place until your provider tells you to remove it. To remove it, moisten or wet the packing with saline or water. Leave stitches (sutures), skin glue, or tape strips in place. These skin closures may need to stay in place for 2 weeks or longer. If tape strip edges start to loosen and curl up, you may trim the loose edges. Do not remove tape strips completely unless your provider tells you to do that. Check your wound  every day for signs of infection. Check for: More redness, swelling, or pain. More fluid or blood. Warmth. Pus or a bad smell. If you were sent home with a drain tube in place, follow instructions from your provider about: How to empty it. How to care for it at home. Be careful when you get rid of used dressings, wound packing, or drainage. Activity Rest the affected area. Return to your normal activities as told by your provider. Ask your provider what activities are safe for you. General instructions Do not use any products that contain nicotine or tobacco. These products include cigarettes, chewing tobacco, and vaping devices, such as e-cigarettes. These can delay incision healing after surgery. If you need help quitting, ask your provider. Do not take baths, swim, or use a hot tub until your provider approves. Ask your provider if you may take showers. You may only be allowed to take sponge baths. The incision will keep draining. It is normal to have some clear or slightly bloody drainage. The amount of drainage should go down each day. Keep all follow-up visits. Your provider will need to make sure that your incision is healing well and that there are no problems. Your health care provider may give you more instructions. Make sure you know what you can and cannot do Contact a health care provider if: Your cyst or abscess comes back. You have any signs of infection. You notice red streaks that spread away from the incision site. You have a fever or chills. Get help right away if: You have severe pain or bleeding. You become short of breath. You have chest pain. You have signs of a severe infection. You may notice changes in your incision area, such as: Swelling that makes the skin feel hard. Numbness or tingling. Sudden increase in redness. Your skin color may change from red to purple, and then to dark spots. Blisters, ulcers, or splitting of the skin. These symptoms may be an  emergency. Get help right away. Call 911. Do not wait to see if the symptoms will go away. Do not drive yourself to the hospital. This information is not intended to replace advice given to you by your health care provider. Make sure you discuss any questions you have with your health care provider. Document Revised: 04/22/2022 Document Reviewed: 04/22/2022 Elsevier Patient Education  2024 [employer] General Anesthesia, Adult, Care After The following information offers guidance on how to care for yourself after your procedure. Your health care provider may also give you more specific instructions. If you have problems or questions, contact your health care provider. What can I expect after the procedure? After the procedure, it is common for  people to: Have pain or discomfort at the IV site. Have nausea or vomiting. Have a sore throat or hoarseness. Have trouble concentrating. Feel cold or chills. Feel weak, sleepy, or tired (fatigue). Have soreness and body aches. These can affect parts of the body that were not involved in surgery. Follow these instructions at home: For the time period you were told by your health care provider:  Rest. Do not participate in activities where you could fall or become injured. Do not drive or use machinery. Do not drink alcohol. Do not take sleeping pills or medicines that cause drowsiness. Do not make important decisions or sign legal documents. Do not take care of children on your own. General instructions Drink enough fluid to keep your urine pale yellow. If you have sleep apnea, surgery and certain medicines can increase your risk for breathing problems. Follow instructions from your health care provider about wearing your sleep device: Anytime you are sleeping, including during daytime naps. While taking prescription pain medicines, sleeping medicines, or medicines that make you drowsy. Return to your normal activities as told by your health  care provider. Ask your health care provider what activities are safe for you. Take over-the-counter and prescription medicines only as told by your health care provider. Do not use any products that contain nicotine or tobacco. These products include cigarettes, chewing tobacco, and vaping devices, such as e-cigarettes. These can delay incision healing after surgery. If you need help quitting, ask your health care provider. Contact a health care provider if: You have nausea or vomiting that does not get better with medicine. You vomit every time you eat or drink. You have pain that does not get better with medicine. You cannot urinate or have bloody urine. You develop a skin rash. You have a fever. Get help right away if: You have trouble breathing. You have chest pain. You vomit blood. These symptoms may be an emergency. Get help right away. Call 911. Do not wait to see if the symptoms will go away. Do not drive yourself to the hospital. Summary After the procedure, it is common to have a sore throat, hoarseness, nausea, vomiting, or to feel weak, sleepy, or fatigue. For the time period you were told by your health care provider, do not drive or use machinery. Get help right away if you have difficulty breathing, have chest pain, or vomit blood. These symptoms may be an emergency. This information is not intended to replace advice given to you by your health care provider. Make sure you discuss any questions you have with your health care provider. Document Revised: 11/30/2021 Document Reviewed: 11/30/2021 Elsevier Patient Education  2024 Else[employer] Inc. How to Use Chlorhexidine Before Surgery Chlorhexidine gluconate (CHG) is a germ-killing (antiseptic) solution that is used to clean the skin. It can get rid of the bacteria that normally live on the skin and can keep them away for about 24 hours. To clean your skin with CHG, you may be given: A CHG solution to use in the shower or as part  of a sponge bath. A prepackaged cloth that contains CHG. Cleaning your skin with CHG may help lower the risk for infection: While you are staying in the intensive care unit of the hospital. If you have a vascular access, such as a central line, to provide short-term or long-term access to your veins. If you have a catheter to drain urine from your bladder. If you are on a ventilator. A ventilator is a machine that helps  you breathe by moving air in and out of your lungs. After surgery. What are the risks? Risks of using CHG include: A skin reaction. Hearing loss, if CHG gets in your ears and you have a perforated eardrum. Eye injury, if CHG gets in your eyes and is not rinsed out. The CHG product catching fire. Make sure that you avoid smoking and flames after applying CHG to your skin. Do not use CHG: If you have a chlorhexidine allergy or have previously reacted to chlorhexidine. On babies younger than 62 months of age. How to use CHG solution Use CHG only as told by your health care provider, and follow the instructions on the label. Use the full amount of CHG as directed. Usually, this is one bottle. During a shower Follow these steps when using CHG solution during a shower (unless your health care provider gives you different instructions): Start the shower. Use your normal soap and shampoo to wash your face and hair. Turn off the shower or move out of the shower stream. Pour the CHG onto a clean washcloth. Do not use any type of brush or rough-edged sponge. Starting at your neck, lather your body down to your toes. Make sure you follow these instructions: If you will be having surgery, pay special attention to the part of your body where you will be having surgery. Scrub this area for at least 1 minute. Do not use CHG on your head or face. If the solution gets into your ears or eyes, rinse them well with water. Avoid your genital area. Avoid any areas of skin that have broken  skin, cuts, or scrapes. Scrub your back and under your arms. Make sure to wash skin folds. Let the lather sit on your skin for 1-2 minutes or as long as told by your health care provider. Thoroughly rinse your entire body in the shower. Make sure that all body creases and crevices are rinsed well. Dry off with a clean towel. Do not put any substances on your body afterward--such as powder, lotion, or perfume--unless you are told to do so by your health care provider. Only use lotions that are recommended by the manufacturer. Put on clean clothes or pajamas. If it is the night before your surgery, sleep in clean sheets.  During a sponge bath Follow these steps when using CHG solution during a sponge bath (unless your health care provider gives you different instructions): Use your normal soap and shampoo to wash your face and hair. Pour the CHG onto a clean washcloth. Starting at your neck, lather your body down to your toes. Make sure you follow these instructions: If you will be having surgery, pay special attention to the part of your body where you will be having surgery. Scrub this area for at least 1 minute. Do not use CHG on your head or face. If the solution gets into your ears or eyes, rinse them well with water. Avoid your genital area. Avoid any areas of skin that have broken skin, cuts, or scrapes. Scrub your back and under your arms. Make sure to wash skin folds. Let the lather sit on your skin for 1-2 minutes or as long as told by your health care provider. Using a different clean, wet washcloth, thoroughly rinse your entire body. Make sure that all body creases and crevices are rinsed well. Dry off with a clean towel. Do not put any substances on your body afterward--such as powder, lotion, or perfume--unless you are told to do  so by your health care provider. Only use lotions that are recommended by the manufacturer. Put on clean clothes or pajamas. If it is the night before your  surgery, sleep in clean sheets. How to use CHG prepackaged cloths Only use CHG cloths as told by your health care provider, and follow the instructions on the label. Use the CHG cloth on clean, dry skin. Do not use the CHG cloth on your head or face unless your health care provider tells you to. When washing with the CHG cloth: Avoid your genital area. Avoid any areas of skin that have broken skin, cuts, or scrapes. Before surgery Follow these steps when using a CHG cloth to clean before surgery (unless your health care provider gives you different instructions): Using the CHG cloth, vigorously scrub the part of your body where you will be having surgery. Scrub using a back-and-forth motion for 3 minutes. The area on your body should be completely wet with CHG when you are done scrubbing. Do not rinse. Discard the cloth and let the area air-dry. Do not put any substances on the area afterward, such as powder, lotion, or perfume. Put on clean clothes or pajamas. If it is the night before your surgery, sleep in clean sheets.  For general bathing Follow these steps when using CHG cloths for general bathing (unless your health care provider gives you different instructions). Use a separate CHG cloth for each area of your body. Make sure you wash between any folds of skin and between your fingers and toes. Wash your body in the following order, switching to a new cloth after each step: The front of your neck, shoulders, and chest. Both of your arms, under your arms, and your hands. Your stomach and groin area, avoiding the genitals. Your right leg and foot. Your left leg and foot. The back of your neck, your back, and your buttocks. Do not rinse. Discard the cloth and let the area air-dry. Do not put any substances on your body afterward--such as powder, lotion, or perfume--unless you are told to do so by your health care provider. Only use lotions that are recommended by the manufacturer. Put on  clean clothes or pajamas. Contact a health care provider if: Your skin gets irritated after scrubbing. You have questions about using your solution or cloth. You swallow any chlorhexidine. Call your local poison control center [phone] in the [place] Get help right away if: Your eyes itch badly, or they become very red or swollen. Your skin itches badly and is red or swollen. Your hearing changes. You have trouble seeing. You have swelling or tingling in your mouth or throat. You have trouble breathing. These symptoms may represent a serious problem that is an emergency. Do not wait to see if the symptoms will go away. Get medical help right away. Call your local emergency services (911 in the [place] Do not drive yourself to the hospital. Summary Chlorhexidine gluconate (CHG) is a germ-killing (antiseptic) solution that is used to clean the skin. Cleaning your skin with CHG may help to lower your risk for infection. You may be given CHG to use for bathing. It may be in a bottle or in a prepackaged cloth to use on your skin. Carefully follow your health care provider's instructions and the instructions on the product label. Do not use CHG if you have a chlorhexidine allergy. Contact your health care provider if your skin gets irritated after scrubbing. This information is not intended to replace advice given  to you by your health care provider. Make sure you discuss any questions you have with your health care provider. Document Revised: 12/31/2021 Document Reviewed: 11/13/2020 Elsevier Patient Education  2023 [employer].

## 2023-07-10 ENCOUNTER — Encounter (HOSPITAL_COMMUNITY)
Admission: RE | Admit: 2023-07-10 | Discharge: 2023-07-10 | Disposition: A | Discharge status: Home / Self Care | Payer: Medicaid Other | Source: Ambulatory Visit | Attending: General Surgery | Admitting: General Surgery

## 2023-07-10 ENCOUNTER — Encounter (HOSPITAL_COMMUNITY): Payer: Self-pay

## 2023-07-10 VITALS — BP 115/77 | HR 100 | Temp 98.2°F | Resp 18 | Ht 65.0 in | Wt 260.0 lb

## 2023-07-10 DIAGNOSIS — Z01818 Encounter for other preprocedural examination: Secondary | ICD-10-CM | POA: Insufficient documentation

## 2023-07-10 DIAGNOSIS — N1831 Chronic kidney disease, stage 3a: Secondary | ICD-10-CM | POA: Insufficient documentation

## 2023-07-10 DIAGNOSIS — R790 Abnormal level of blood mineral: Secondary | ICD-10-CM | POA: Diagnosis not present

## 2023-07-10 DIAGNOSIS — Z6841 Body Mass Index (BMI) 40.0 and over, adult: Secondary | ICD-10-CM | POA: Insufficient documentation

## 2023-07-10 DIAGNOSIS — E1169 Type 2 diabetes mellitus with other specified complication: Secondary | ICD-10-CM | POA: Diagnosis not present

## 2023-07-10 DIAGNOSIS — Z01812 Encounter for preprocedural laboratory examination: Secondary | ICD-10-CM | POA: Diagnosis present

## 2023-07-10 DIAGNOSIS — Z720 Tobacco use: Secondary | ICD-10-CM | POA: Diagnosis not present

## 2023-07-10 DIAGNOSIS — E669 Obesity, unspecified: Secondary | ICD-10-CM

## 2023-07-10 DIAGNOSIS — Z01810 Encounter for preprocedural cardiovascular examination: Secondary | ICD-10-CM | POA: Diagnosis present

## 2023-07-10 DIAGNOSIS — E1122 Type 2 diabetes mellitus with diabetic chronic kidney disease: Secondary | ICD-10-CM | POA: Insufficient documentation

## 2023-07-10 LAB — CBC WITH DIFFERENTIAL/PLATELET
Abs Immature Granulocytes: 0.21 10*3/uL — ABNORMAL HIGH (ref 0.00–0.07)
Basophils Absolute: 0.1 10*3/uL (ref 0.0–0.1)
Basophils Relative: 0 %
Eosinophils Absolute: 0.2 10*3/uL (ref 0.0–0.5)
Eosinophils Relative: 1 %
HCT: 30.4 % — ABNORMAL LOW (ref 36.0–46.0)
Hemoglobin: 9.1 g/dL — ABNORMAL LOW (ref 12.0–15.0)
Immature Granulocytes: 1 %
Lymphocytes Relative: 19 %
Lymphs Abs: 2.9 10*3/uL (ref 0.7–4.0)
MCH: 19 pg — ABNORMAL LOW (ref 26.0–34.0)
MCHC: 29.9 g/dL — ABNORMAL LOW (ref 30.0–36.0)
MCV: 63.5 fL — ABNORMAL LOW (ref 80.0–100.0)
Monocytes Absolute: 1 10*3/uL (ref 0.1–1.0)
Monocytes Relative: 6 %
Neutro Abs: 11.4 10*3/uL — ABNORMAL HIGH (ref 1.7–7.7)
Neutrophils Relative %: 73 %
Platelets: 364 10*3/uL (ref 150–400)
RBC: 4.79 MIL/uL (ref 3.87–5.11)
RDW: 18.1 % — ABNORMAL HIGH (ref 11.5–15.5)
WBC: 15.8 10*3/uL — ABNORMAL HIGH (ref 4.0–10.5)
nRBC: 0 % (ref 0.0–0.2)

## 2023-07-10 LAB — POCT PREGNANCY, URINE: Preg Test, Ur: NEGATIVE

## 2023-07-10 LAB — BASIC METABOLIC PANEL
Anion gap: 11 (ref 5–15)
BUN: 7 mg/dL (ref 6–20)
CO2: 20 mmol/L — ABNORMAL LOW (ref 22–32)
Calcium: 8 mg/dL — ABNORMAL LOW (ref 8.9–10.3)
Chloride: 100 mmol/L (ref 98–111)
Creatinine, Ser: 0.37 mg/dL — ABNORMAL LOW (ref 0.44–1.00)
GFR, Estimated: >60 mL/min (ref >60)
Glucose, Bld: 280 mg/dL — ABNORMAL HIGH (ref 70–99)
Potassium: 3.7 mmol/L (ref 3.5–5.1)
Sodium: 131 mmol/L — ABNORMAL LOW (ref 135–145)

## 2023-07-10 LAB — HEMOGLOBIN A1C
Hgb A1c MFr Bld: 10.1 % — ABNORMAL HIGH (ref 4.8–5.6)
Mean Plasma Glucose: 243.17 mg/dL

## 2023-07-11 ENCOUNTER — Ambulatory Visit (HOSPITAL_COMMUNITY)
Admission: RE | Admit: 2023-07-11 | Discharge: 2023-07-11 | Disposition: A | Discharge status: Home / Self Care | Payer: Medicaid Other | Attending: General Surgery | Admitting: General Surgery

## 2023-07-11 ENCOUNTER — Ambulatory Visit (HOSPITAL_COMMUNITY): Payer: Medicaid Other | Admitting: Certified Registered Nurse Anesthetist

## 2023-07-11 ENCOUNTER — Other Ambulatory Visit: Payer: Self-pay

## 2023-07-11 ENCOUNTER — Encounter (HOSPITAL_COMMUNITY): Payer: Self-pay | Admitting: General Surgery

## 2023-07-11 ENCOUNTER — Encounter (HOSPITAL_COMMUNITY)
Admission: RE | Disposition: A | Discharge status: Home / Self Care | Payer: Self-pay | Source: Home / Self Care | Attending: General Surgery

## 2023-07-11 ENCOUNTER — Ambulatory Visit (HOSPITAL_BASED_OUTPATIENT_CLINIC_OR_DEPARTMENT_OTHER): Payer: Medicaid Other | Admitting: Certified Registered Nurse Anesthetist

## 2023-07-11 DIAGNOSIS — I129 Hypertensive chronic kidney disease with stage 1 through stage 4 chronic kidney disease, or unspecified chronic kidney disease: Secondary | ICD-10-CM | POA: Diagnosis not present

## 2023-07-11 DIAGNOSIS — N611 Abscess of the breast and nipple: Secondary | ICD-10-CM

## 2023-07-11 DIAGNOSIS — K219 Gastro-esophageal reflux disease without esophagitis: Secondary | ICD-10-CM | POA: Diagnosis not present

## 2023-07-11 DIAGNOSIS — E1122 Type 2 diabetes mellitus with diabetic chronic kidney disease: Secondary | ICD-10-CM | POA: Insufficient documentation

## 2023-07-11 DIAGNOSIS — N189 Chronic kidney disease, unspecified: Secondary | ICD-10-CM | POA: Insufficient documentation

## 2023-07-11 DIAGNOSIS — E119 Type 2 diabetes mellitus without complications: Secondary | ICD-10-CM

## 2023-07-11 DIAGNOSIS — J45909 Unspecified asthma, uncomplicated: Secondary | ICD-10-CM | POA: Diagnosis not present

## 2023-07-11 DIAGNOSIS — E669 Obesity, unspecified: Secondary | ICD-10-CM | POA: Diagnosis not present

## 2023-07-11 DIAGNOSIS — Z6841 Body Mass Index (BMI) 40.0 and over, adult: Secondary | ICD-10-CM | POA: Diagnosis not present

## 2023-07-11 DIAGNOSIS — F17210 Nicotine dependence, cigarettes, uncomplicated: Secondary | ICD-10-CM | POA: Diagnosis not present

## 2023-07-11 DIAGNOSIS — R000 Tachycardia, unspecified: Secondary | ICD-10-CM | POA: Diagnosis not present

## 2023-07-11 HISTORY — PX: BREAST BIOPSY: SHX20

## 2023-07-11 HISTORY — PX: INCISION AND DRAINAGE ABSCESS: SHX5864

## 2023-07-11 LAB — GLUCOSE, CAPILLARY
Glucose-Capillary: 260 mg/dL — ABNORMAL HIGH (ref 70–99)
Glucose-Capillary: 230 mg/dL — ABNORMAL HIGH (ref 70–99)

## 2023-07-11 SURGERY — INCISION AND DRAINAGE, ABSCESS
Anesthesia: General | Site: Breast | Laterality: Left

## 2023-07-11 MED ORDER — FENTANYL CITRATE PF 50 MCG/ML IJ SOSY
PREFILLED_SYRINGE | INTRAMUSCULAR | Status: AC
  Filled 2023-07-11: qty 1

## 2023-07-11 MED ORDER — CEFAZOLIN SODIUM-DEXTROSE 2-4 GM/100ML-% IV SOLN
2.0000 g | INTRAVENOUS | Status: AC
  Administered 2023-07-11: 2 g via INTRAVENOUS

## 2023-07-11 MED ORDER — ORAL CARE MOUTH RINSE
15.0000 mL | Freq: Once | OROMUCOSAL | Status: AC
  Administered 2023-07-11: 15 mL via OROMUCOSAL

## 2023-07-11 MED ORDER — OXYCODONE HCL 5 MG/5ML PO SOLN: 5.0000 mg | Freq: Once | ORAL | Status: DC | PRN

## 2023-07-11 MED ORDER — SODIUM CHLORIDE 0.9 % IR SOLN
Status: DC | PRN
  Administered 2023-07-11: 1000 mL

## 2023-07-11 MED ORDER — PROPOFOL 10 MG/ML IV BOLUS
INTRAVENOUS | Status: DC | PRN
  Administered 2023-07-11: 200 mg via INTRAVENOUS

## 2023-07-11 MED ORDER — DEXAMETHASONE SODIUM PHOSPHATE 10 MG/ML IJ SOLN
INTRAMUSCULAR | Status: DC | PRN
  Administered 2023-07-11: 10 mg via INTRAVENOUS

## 2023-07-11 MED ORDER — FENTANYL CITRATE (PF) 100 MCG/2ML IJ SOLN
INTRAMUSCULAR | Status: AC
  Filled 2023-07-11: qty 2

## 2023-07-11 MED ORDER — PHENYLEPHRINE HCL (PRESSORS) 10 MG/ML IV SOLN
INTRAVENOUS | Status: DC | PRN
  Administered 2023-07-11 (×4): 160 ug via INTRAVENOUS

## 2023-07-11 MED ORDER — OXYCODONE HCL 5 MG PO TABS: 5.0000 mg | ORAL_TABLET | Freq: Once | ORAL | Status: DC | PRN

## 2023-07-11 MED ORDER — PROPOFOL 10 MG/ML IV BOLUS
INTRAVENOUS | Status: AC
  Filled 2023-07-11: qty 20

## 2023-07-11 MED ORDER — SULFAMETHOXAZOLE-TRIMETHOPRIM 800-160 MG PO TABS: 1.0000 | ORAL_TABLET | Freq: Two times a day (BID) | ORAL | 0 refills | Status: AC

## 2023-07-11 MED ORDER — MIDAZOLAM HCL 2 MG/2ML IJ SOLN
INTRAMUSCULAR | Status: AC
  Filled 2023-07-11: qty 2

## 2023-07-11 MED ORDER — CHLORHEXIDINE GLUCONATE CLOTH 2 % EX PADS: 6.0000 | MEDICATED_PAD | Freq: Once | CUTANEOUS | Status: DC

## 2023-07-11 MED ORDER — LIDOCAINE 5 % EX OINT
TOPICAL_OINTMENT | Freq: Three times a day (TID) | CUTANEOUS | Status: DC | PRN
  Filled 2023-07-11: qty 35.44

## 2023-07-11 MED ORDER — MIDAZOLAM HCL 5 MG/5ML IJ SOLN
INTRAMUSCULAR | Status: DC | PRN
  Administered 2023-07-11: 2 mg via INTRAVENOUS

## 2023-07-11 MED ORDER — DEXMEDETOMIDINE HCL IN NACL 80 MCG/20ML IV SOLN
INTRAVENOUS | Status: DC | PRN
  Administered 2023-07-11: 20 ug via INTRAVENOUS

## 2023-07-11 MED ORDER — FENTANYL CITRATE (PF) 100 MCG/2ML IJ SOLN
INTRAMUSCULAR | Status: DC | PRN
  Administered 2023-07-11 (×2): 100 ug via INTRAVENOUS

## 2023-07-11 MED ORDER — FENTANYL CITRATE PF 50 MCG/ML IJ SOSY
25.0000 ug | PREFILLED_SYRINGE | INTRAMUSCULAR | Status: DC | PRN
  Administered 2023-07-11: 50 ug via INTRAVENOUS

## 2023-07-11 MED ORDER — ONDANSETRON HCL 4 MG/2ML IJ SOLN: 4.0000 mg | Freq: Once | INTRAMUSCULAR | Status: DC | PRN

## 2023-07-11 MED ORDER — ONDANSETRON HCL 4 MG/2ML IJ SOLN
INTRAMUSCULAR | Status: DC | PRN
  Administered 2023-07-11: 4 mg via INTRAVENOUS

## 2023-07-11 MED ORDER — CHLORHEXIDINE GLUCONATE 0.12 % MT SOLN: 15.0000 mL | Freq: Once | OROMUCOSAL | Status: AC

## 2023-07-11 MED ORDER — CEFAZOLIN SODIUM-DEXTROSE 2-4 GM/100ML-% IV SOLN
INTRAVENOUS | Status: AC
  Filled 2023-07-11: qty 100

## 2023-07-11 MED ORDER — LACTATED RINGERS IV SOLN: INTRAVENOUS | Status: DC | PRN

## 2023-07-11 SURGICAL SUPPLY — 29 items
BINDER BREAST LRG (GAUZE/BANDAGES/DRESSINGS) IMPLANT
BINDER BREAST XLRG (GAUZE/BANDAGES/DRESSINGS) IMPLANT
BNDG CONFORM 2 STRL LF (GAUZE/BANDAGES/DRESSINGS) ×2 IMPLANT
CLOTH BEACON ORANGE TIMEOUT ST (SAFETY) ×2 IMPLANT
COVER LIGHT HANDLE STERIS (MISCELLANEOUS) ×4 IMPLANT
ELECT REM PT RETURN 9FT ADLT (ELECTROSURGICAL) ×2
ELECTRODE REM PT RTRN 9FT ADLT (ELECTROSURGICAL) ×2 IMPLANT
EVACUATOR DRAINAGE 10X20 100CC (DRAIN) IMPLANT
EVACUATOR SILICONE 100CC (DRAIN) ×4
GAUZE SPONGE 4X4 12PLY STRL (GAUZE/BANDAGES/DRESSINGS) ×4 IMPLANT
GLOVE BIO SURGEON STRL SZ 6.5 (GLOVE) ×2 IMPLANT
GLOVE BIO SURGEON STRL SZ7 (GLOVE) IMPLANT
GLOVE BIOGEL PI IND STRL 6.5 (GLOVE) ×2 IMPLANT
GLOVE BIOGEL PI IND STRL 7.0 (GLOVE) ×4 IMPLANT
GOWN STRL REUS W/TWL LRG LVL3 (GOWN DISPOSABLE) ×4 IMPLANT
KIT TURNOVER KIT A (KITS) ×2 IMPLANT
MANIFOLD NEPTUNE II (INSTRUMENTS) ×2 IMPLANT
NS IRRIG 1000ML POUR BTL (IV SOLUTION) ×2 IMPLANT
PACK MINOR (CUSTOM PROCEDURE TRAY) ×2 IMPLANT
PAD ABD 5X9 TENDERSORB (GAUZE/BANDAGES/DRESSINGS) IMPLANT
PAD ARMBOARD 7.5X6 YLW CONV (MISCELLANEOUS) ×2 IMPLANT
PAD TELFA 3X4 1S STER (GAUZE/BANDAGES/DRESSINGS) IMPLANT
POSITIONER HEAD 8X9X4 ADT (SOFTGOODS) ×2 IMPLANT
SET BASIN LINEN APH (SET/KITS/TRAYS/PACK) ×2 IMPLANT
SOL PREP PROV IODINE SCRUB 4OZ (MISCELLANEOUS) ×2 IMPLANT
SPONGE T-LAP 18X18 ~~LOC~~+RFID (SPONGE) IMPLANT
SUT ETHILON 3 0 FSL (SUTURE) IMPLANT
SWAB CULTURE ESWAB REG 1ML (MISCELLANEOUS) IMPLANT
SYR BULB IRRIG 60ML STRL (SYRINGE) ×2 IMPLANT

## 2023-07-11 NOTE — Interval H&P Note (Signed)
History and Physical Interval Note:  07/11/2023 12:13 PM  Patricia Stanton  has presented today for surgery, with the diagnosis of BREAST ABSCESS.  The various methods of treatment have been discussed with the patient and family. After consideration of risks, benefits and other options for treatment, the patient has consented to  Procedure(s): INCISION AND DRAINAGE ABSCESS, BREAST (Left) as a surgical intervention.  The patient's history has been reviewed, patient examined, no change in status, stable for surgery.  I have reviewed the patient's chart and labs.  Questions were answered to the patient's satisfaction.    JP drain likely Lucretia Roers

## 2023-07-11 NOTE — Anesthesia Preprocedure Evaluation (Addendum)
Anesthesia Evaluation  Patient identified by MRN, date of birth, ID band Patient awake    Reviewed: Allergy & Precautions, NPO status , Patient's Chart, lab work & pertinent test results  History of Anesthesia Complications Negative for: history of anesthetic complications  Airway Mallampati: III  TM Distance: >3 FB Neck ROM: Full    Dental  (+) Dental Advisory Given, Teeth Intact   Pulmonary asthma , Current SmokerPatient did not abstain from smoking. Stop Bang 4   Pulmonary exam normal breath sounds clear to auscultation       Cardiovascular Exercise Tolerance: Poor hypertension, Pt. on medications Normal cardiovascular exam Rhythm:Regular Rate:Tachycardia     Neuro/Psych  Headaches PSYCHIATRIC DISORDERS Anxiety Depression       GI/Hepatic ,GERD  Medicated,,  Endo/Other  diabetes, Well Controlled, Type 2, Oral Hypoglycemic Agents  Morbid obesityAdrenal tumor  Renal/GU Renal disease     Musculoskeletal  (+) Arthritis ,    Abdominal Normal abdominal exam  (+)   Peds  Hematology   Anesthesia Other Findings   Reproductive/Obstetrics                             Anesthesia Physical Anesthesia Plan  ASA: 3  Anesthesia Plan: General   Post-op Pain Management:    Induction: Intravenous  PONV Risk Score and Plan: Ondansetron, Dexamethasone, Midazolam and Scopolamine patch - Pre-op  Airway Management Planned: LMA  Additional Equipment: None  Intra-op Plan:   Post-operative Plan: Extubation in OR  Informed Consent: I have reviewed the patients History and Physical, chart, labs and discussed the procedure including the risks, benefits and alternatives for the proposed anesthesia with the patient or authorized representative who has indicated his/her understanding and acceptance.     Dental advisory given  Plan Discussed with: CRNA and Surgeon  Anesthesia Plan Comments:          Anesthesia Quick Evaluation

## 2023-07-11 NOTE — Op Note (Signed)
[facility] Operative Note  07/11/23  Preoperative Diagnosis: Left breast abscess    Postoperative Diagnosis: Same   Procedure(s) Performed: Incision and drainage of left breast abscess, JP drain placement X 2, excisional breast biopsy of abscess cavity    Surgeon: Leatrice Jewels. Henreitta Leber, MD   Assistants: No qualified resident was available    Anesthesia: General    Anesthesiologist: Dr. Leta Jungling, MD    Specimens: Culture, random excisional biopsy of abscess cavity    Estimated Blood Loss: Minimal   Blood Replacement: None    Complications: None   Wound Class: Dirty infected    Operative Indications: Ms. Woodberry is a 37 yo who has an left breast abscess s/p biopsy that has been draining from her biopsy site. She had been treated with antibiotics but had no prior aspiration or incision and drainage.  She came to see me for possible excision but sine this had never been formally drained I felt like that was not appropriate in the setting of an active infection with a large amount of indurated breast tissue. We discussed incision and drainage and drain placement and biopsy. She has a risk of recurrence due to smoking but if it does recur we can discuss an excision of the cavity at that time.   Findings: Abscess cavity with purulence, random   Procedure: The patient was taken to the operating room and placed supine. General anesthesia was induced. Intravenous antibiotics were administered per protocol.  The left breast was prepped and draped in the usual sterile fashion.   An incision was made extending the biopsy site that was draining laterally. This opened up the cavity that was retroareolar and purulence was drained. Culture was obtained. I then made a counter incision on the superior medial site of the breast. All loculations were broken up. I took random biopsies of the abscess cavity given the induration using an allis clamp to remove pieces of the cavity. These were  all sent together as a representative sample. The wounds were irrigated. JP drains were brought through the incisions and secured with 3-0 Nylon sutures.   Final inspection revealed acceptable hemostasis. All counts were correct at the end of the case. The patient was awakened from anesthesia and extubated without complication.  The patient went to the PACU in stable condition.   Algis Greenhouse, MD [facility] [address] [place], [place] [address] [phone] (office)

## 2023-07-11 NOTE — Anesthesia Procedure Notes (Signed)
Procedure Name: LMA Insertion Date/Time: 07/11/2023 1:11 PM  Performed by: Shanon Payor, CRNAPre-anesthesia Checklist: Patient identified, Emergency Drugs available, Suction available, Patient being monitored and Timeout performed Patient Re-evaluated:Patient Re-evaluated prior to induction Oxygen Delivery Method: Circle system utilized Preoxygenation: Pre-oxygenation with 100% oxygen Induction Type: IV induction LMA: LMA inserted LMA Size: 4.0 Number of attempts: 1 Placement Confirmation: positive ETCO2, CO2 detector and breath sounds checked- equal and bilateral Tube secured with: Tape Dental Injury: Teeth and Oropharynx as per pre-operative assessment

## 2023-07-11 NOTE — Progress Notes (Addendum)
[facility]  Drains placed. Updated family. Culture sent.   Will see next week.  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 Asking for lidocaine for the skin. Have ordered lidocaine ointment 5% to be applied in pacu on skin but not in or near the drains. Can take home tube with her.  Has a pain doctor and sees them next week. Has percocet for pain and has enough she reports.  Bactrim sent in for abscess.   Algis Greenhouse, MD [facility] [address] [place], [place] [address] [phone] (office)

## 2023-07-11 NOTE — Anesthesia Postprocedure Evaluation (Signed)
Anesthesia Post Note  Patient: Patricia Stanton  Procedure(s) Performed: INCISION AND DRAINAGE ABSCESS, BREAST (Left) LEFT BREAST BIOPSY (Left: Breast)  Patient location during evaluation: PACU Anesthesia Type: General Level of consciousness: awake and alert Pain management: pain level controlled Vital Signs Assessment: post-procedure vital signs reviewed and stable Respiratory status: spontaneous breathing, nonlabored ventilation, respiratory function stable and patient connected to nasal cannula oxygen Cardiovascular status: blood pressure returned to baseline and stable Postop Assessment: no apparent nausea or vomiting Anesthetic complications: no   There were no known notable events for this encounter.   Last Vitals:  Vitals:   07/11/23 1459 07/11/23 1500  BP: 130/79 130/79  Pulse: 93 94  Resp: 16   Temp: 36.7 C   SpO2: 93% 92%    Last Pain:  Vitals:   07/11/23 1459  TempSrc: Oral  PainSc: 0-No pain                 Orville Mena L Nicoya Friel

## 2023-07-11 NOTE — Discharge Instructions (Signed)
JP Drain Care: Please keep the drain clean and dry. Replace the pad as needed or daily. You can use feminine pads to collect any drainage coming around the drains. Strip the tubing daily for both tubes.  Please do not mess with or cut the stitch that is keeping the drain in place. Secure the drain to your clothes so that it does not get dislodged.  You may want to wear a binder around your abdomen (girdle) at night for sleeping if you are worried about it getting pulled out.  Please record the output from the drain daily including the color and the amount in milliliters.  Record each drain separately.  Please keep the drains covered  when you shower. Do not get the drains wet. Bird bath if needed.

## 2023-07-11 NOTE — Transfer of Care (Signed)
Immediate Anesthesia Transfer of Care Note  Patient: Patricia Stanton  Procedure(s) Performed: INCISION AND DRAINAGE ABSCESS, BREAST (Left) LEFT BREAST BIOPSY (Left: Breast)  Patient Location: PACU  Anesthesia Type:General  Level of Consciousness: awake, alert , oriented, and patient cooperative  Airway & Oxygen Therapy: Patient Spontanous Breathing and Patient connected to face mask oxygen  Post-op Assessment: Report given to RN, Post -op Vital signs reviewed and stable, and Patient moving all extremities X 4  Post vital signs: Reviewed and stable  Last Vitals:  Vitals Value Taken Time  BP    Temp    Pulse    Resp    SpO2      Last Pain:  Vitals:   07/11/23 1201  TempSrc: Oral  PainSc: 8       Patients Stated Pain Goal: 8 (07/11/23 1201)  Complications: No notable events documented.

## 2023-07-11 NOTE — Progress Notes (Signed)
Called Dr. Henreitta Leber to inform her the "top" JP drain was not charging.  Bottom JP drain is charging.  Dr. Henreitta Leber states to keep trying and if it will not charge inform the patient it should still passively drain some.  But it can't be helped now.  Patient informed of what the doctor says and she voiced understanding.

## 2023-07-14 LAB — AEROBIC/ANAEROBIC CULTURE W GRAM STAIN (SURGICAL/DEEP WOUND): Culture: NEGATIVE

## 2023-07-15 LAB — SURGICAL PATHOLOGY

## 2023-07-16 ENCOUNTER — Ambulatory Visit (INDEPENDENT_AMBULATORY_CARE_PROVIDER_SITE_OTHER): Payer: Medicaid Other | Admitting: General Surgery

## 2023-07-16 ENCOUNTER — Encounter (HOSPITAL_COMMUNITY): Payer: Self-pay | Admitting: General Surgery

## 2023-07-16 VITALS — BP 129/72 | HR 110 | Temp 97.6°F | Resp 16 | Ht 65.0 in | Wt 261.0 lb

## 2023-07-16 DIAGNOSIS — N611 Abscess of the breast and nipple: Secondary | ICD-10-CM

## 2023-07-16 DIAGNOSIS — Z79899 Other long term (current) drug therapy: Secondary | ICD-10-CM | POA: Diagnosis not present

## 2023-07-16 NOTE — Progress Notes (Signed)
Pathology all benign. Let the patient know.

## 2023-07-16 NOTE — Patient Instructions (Signed)
Continue JP drain care. Call with issues.

## 2023-07-16 NOTE — Progress Notes (Signed)
[facility]  Cultures grew out- MODERATE FINEGOLDIA MAGNA and bactrim should cover this. Updated patient that pathology is benign.  Her drains and doing well. JP drain top is draining more at ~ 10cc of brownish fluid. The bottom JP is draining more at < 5cc a day now.  BP 129/72   Pulse (!) 110   Temp 97.6 F (36.4 C) (Oral)   Resp 16   Ht 5\' 5"  (1.651 m)   Wt 261 lb (118.4 kg)   LMP 06/24/2023   SpO2 97%   BMI 43.43 kg/m  Removed lower drain, tannish fluid in bulb, cloudy Top drain in place Less indurated and sensitive left breast   Patient s/p left breast abscess I&D and drain placement.  Continue JP drain care. Call with issues.   Future Appointments  Date Time Provider Department Center  07/22/2023  9:45 AM Lucretia Roers, MD [facility] None    Algis Greenhouse, MD [facility] [address] [place] [place], [place] [address] [phone] (office)

## 2023-07-22 ENCOUNTER — Ambulatory Visit (INDEPENDENT_AMBULATORY_CARE_PROVIDER_SITE_OTHER): Payer: Medicaid Other | Admitting: General Surgery

## 2023-07-22 ENCOUNTER — Encounter: Payer: Self-pay | Admitting: General Surgery

## 2023-07-22 VITALS — BP 157/83 | HR 106 | Temp 98.1°F | Resp 12 | Ht 65.0 in | Wt 264.0 lb

## 2023-07-22 DIAGNOSIS — N611 Abscess of the breast and nipple: Secondary | ICD-10-CM

## 2023-07-22 MED ORDER — SULFAMETHOXAZOLE-TRIMETHOPRIM 800-160 MG PO TABS: 1.0000 | ORAL_TABLET | Freq: Two times a day (BID) | ORAL | 0 refills | Status: AC

## 2023-07-22 NOTE — Progress Notes (Signed)
[facility]  JP with 1-3cc of creaming fluid. No major drainage from the inferior wound.  BP (!) 157/83   Pulse (!) 106   Temp 98.1 F (36.7 C) (Oral)   Resp 12   Ht 5\' 5"  (1.651 m)   Wt 264 lb (119.7 kg)   LMP 06/24/2023   SpO2 98%   BMI 43.93 kg/m  Inferior wound still open, JP visible, JP upper removed, covered lower wound and upper wound   Patient s/p I&D of breast abscess, JP drain placement, biopsy. Doing well.  Leave bandage on upper wound for 48 hours. Can remove after that. Lower opening use a qtip and peroxide to clear out the area daily. Keep covered. Expect some drainage. Take antibiotic to be safe.    Doing well overall.  Will need mammogram in a few months to ensure healed up. Would wait a good 8 weeks post I&D.    Future Appointments  Date Time Provider Department Center  07/30/2023  3:00 PM Lucretia Roers, MD [facility] None   Algis Greenhouse, MD [facility] [address] [place], [place] [address] [phone] (office)

## 2023-07-22 NOTE — Patient Instructions (Addendum)
Leave bandage on upper wound for 48 hours. Can remove after that. Lower opening use a qtip and peroxide to clear out the area daily. Keep covered. Expect some drainage. Take antibiotic to be safe.

## 2023-07-30 ENCOUNTER — Ambulatory Visit (INDEPENDENT_AMBULATORY_CARE_PROVIDER_SITE_OTHER): Payer: Medicaid Other | Admitting: General Surgery

## 2023-07-30 ENCOUNTER — Encounter: Payer: Self-pay | Admitting: General Surgery

## 2023-07-30 VITALS — BP 157/91 | HR 109 | Temp 98.1°F | Resp 18 | Ht 65.0 in | Wt 260.0 lb

## 2023-07-30 DIAGNOSIS — N611 Abscess of the breast and nipple: Secondary | ICD-10-CM

## 2023-07-30 NOTE — Patient Instructions (Signed)
Inflammation from the infection will continue to decrease, making the breast softer and less swollen with time. Drainage will continue to decrease and opening will seal up  Call with issues between now and appt.

## 2023-07-30 NOTE — Progress Notes (Signed)
[facility]  Doing well but worried about the hardness of the breast. Draining some.  BP (!) 157/91   Pulse (!) 109   Temp 98.1 F (36.7 C) (Oral)   Resp 18   Ht 5\' 5"  (1.651 m)   Wt 260 lb (117.9 kg)   LMP 06/24/2023   SpO2 94%   BMI 43.27 kg/m  Left breast with some inferior opening, < 0.5cm, no erythema, indurated under the nipple but less tender and allows me to palpate, improving   Patient s/p I&D of breast abscess and drain placement, biopsy. Drains are out.   Inflammation from the infection will continue to decrease, making the breast softer and less swollen with time. Drainage will continue to decrease and opening will seal up  Call with issues between now and appt.    Future Appointments  Date Time Provider Department Center  07/30/2023  3:00 PM Lucretia Roers, MD [facility] None  08/28/2023  1:15 PM Lucretia Roers, MD [facility] None   Algis Greenhouse, MD [facility] [address] [place], [place] [address] [phone] (office)

## 2023-08-05 ENCOUNTER — Telehealth: Payer: Self-pay | Admitting: *Deleted

## 2023-08-05 ENCOUNTER — Encounter: Payer: Self-pay | Admitting: General Surgery

## 2023-08-05 ENCOUNTER — Ambulatory Visit (INDEPENDENT_AMBULATORY_CARE_PROVIDER_SITE_OTHER): Payer: Medicaid Other | Admitting: General Surgery

## 2023-08-05 VITALS — BP 167/100 | HR 107 | Temp 97.6°F | Resp 18 | Ht 65.0 in | Wt 263.0 lb

## 2023-08-05 DIAGNOSIS — N611 Abscess of the breast and nipple: Secondary | ICD-10-CM

## 2023-08-05 MED ORDER — AMOXICILLIN-POT CLAVULANATE 875-125 MG PO TABS: 1.0000 | ORAL_TABLET | Freq: Two times a day (BID) | ORAL | 0 refills | Status: AC

## 2023-08-05 NOTE — Telephone Encounter (Signed)
New orders obtained for Augmentin BID X10 days.   Call placed to patient. LMTRC.

## 2023-08-05 NOTE — Telephone Encounter (Signed)
Surgical Date: 07/11/2023 Procedure: INCISION AND DRAINAGE ABSCESS, BREAST, LEFT  Received call from patient [phone]~ telephone.   Patient reports that left breast drainage has increased overnight. Reports dark brown drainage that has foul odor had saturated her overnight shirt. Reports that left breast is more swollen, tender and the hardness in the breast has increased. States that skin of breast is red and warm to touch.   Please advise.

## 2023-08-05 NOTE — Patient Instructions (Signed)
Pack wound daily to twice daily. Take antibiotic. Keep covered. Ok to shower and replace the packing.

## 2023-08-05 NOTE — Telephone Encounter (Signed)
Can send in antibiotic but if this recurs she will need a large I&D and packing. Encourage smoking cessation.

## 2023-08-05 NOTE — Telephone Encounter (Signed)
Patient returned call and made aware.   Prescription sent to pharmacy.  

## 2023-08-06 NOTE — Progress Notes (Addendum)
[facility] Procedure Note  08/06/23  Pre-procedure Diagnosis:  Recurrent abscess left breast    Post-procedure Diagnosis: Same   Procedure(s) Performed: Incision and drainage of left breast abscess, packing    Surgeon: Leatrice Jewels. Henreitta Leber, MD   Assistants: No qualified resident was available    Anesthesia: Lidocaine 1%    Specimens:  Culture    Estimated Blood Loss: Minimal   Wound Class:Dirty Infected    Procedure Indications: Ms. Seely has undergone a formal I&D in the OR and drain placement but noted today drainage from her upper incision that had healed. She says her breast was more swollen and red over the last 24 hours. Now she has purulence draining.  Discussed another incision and drainage and need for packing due to the inability to heal the area and recurrent infection. She has been trying to reduce her smoking.   Findings: Purulent drainage, abscess    Procedure: The patient was taken to the procedure room and placed semi-upright. The left breast was prepared and draped in the usual sterile fashion. Lidocaine 1% was injected over the upper incision from her prior I&D site.  This was opened up with and 11 blade after local. This allowed for irrigation of the abscess cavity with saline. This then was placed with saline dampened kerlix and covered with ABD and papertape.    Final inspection revealed acceptable hemostasis. The patient tolerated the procedure well.   Pack wound daily to twice daily. Take antibiotic. Keep covered. Ok to shower and replace the packing.   Future Appointments  Date Time Provider Department Center  08/12/2023 10:45 AM Lucretia Roers, MD [facility] None  08/28/2023  1:15 PM Lucretia Roers, MD [facility] None      Algis Greenhouse, MD [facility] [address] [place], [place] [address] [phone] (office)

## 2023-08-09 LAB — WOUND CULTURE
MICRO NUMBER:: 15752275
RESULT:: NO GROWTH
SPECIMEN QUALITY:: ADEQUATE

## 2023-08-12 ENCOUNTER — Ambulatory Visit (INDEPENDENT_AMBULATORY_CARE_PROVIDER_SITE_OTHER): Payer: Medicaid Other | Admitting: General Surgery

## 2023-08-12 ENCOUNTER — Encounter: Payer: Self-pay | Admitting: General Surgery

## 2023-08-12 DIAGNOSIS — B3731 Acute candidiasis of vulva and vagina: Secondary | ICD-10-CM

## 2023-08-12 DIAGNOSIS — N611 Abscess of the breast and nipple: Secondary | ICD-10-CM

## 2023-08-12 MED ORDER — FLUCONAZOLE 150 MG PO TABS: 150.0000 mg | ORAL_TABLET | ORAL | 0 refills | Status: DC

## 2023-08-12 NOTE — Patient Instructions (Signed)
Continue packing with saline dampened gauze, can transition to the iodoform packing once the area is too narrow.

## 2023-08-12 NOTE — Progress Notes (Unsigned)
[facility]  Future Appointments  Date Time Provider Department Center  08/28/2023  1:15 PM Lucretia Roers, MD [facility] None

## 2023-08-13 DIAGNOSIS — Z79899 Other long term (current) drug therapy: Secondary | ICD-10-CM | POA: Diagnosis not present

## 2023-08-16 NOTE — Progress Notes (Unsigned)
Referring Provider: Micki Riley, Harlin Rain, * Primary Care Physician:  Micki Riley, Harlin Rain, NP Primary GI Physician: Dr. Jena Gauss  Chief Complaint  Patient presents with   Abdominal Pain    Knot on right side.     HPI:   Patricia Stanton is a 37 y.o. female with GI history of constipation, GERD, upper abdominal pain, bloating.  Prior cholecystectomy in 2016 and ventral hernia repair with mesh in 2017.  Last EGD in March 2022 with small hiatal hernia, otherwise normal exam.  No prior colonoscopy.  We have not seen patient since November 2022.  She is presenting today with chief complaint of bloating, constipation, RUQ pain.  Reports of bloating and trouble with bowel movements started the last couple of months.  She had been controlling her IBS symptoms well with activity, but this does not seem to be working as well anymore.  She is skipping a few days between bowel movements.  She is not taking Linzess at this time.  Has not taken it in several months.  Denies any recent medication changes.  Started Ozempic last year. Drinking plenty of water and eating plenty of fiber. No brbpr or melena.    Also noticed knot on her right side after thanksgiving with tenderness in this area.  Still feels slightly tender.  Thought it was from bloating. No nausea or vomiting.   No GERD symptoms.    No NSAIDs.   Wants something that will clean her colon out to start fresh.    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) 03/25/2019   Mood disorder (HCC)    Obesity    Ovarian cyst    Tobacco abuse     Past Surgical History:   Procedure Laterality Date   BREAST BIOPSY Left 06/17/2023   [place] LT BREAST BX W LOC DEV 1ST LESION IMG BX SPEC US GUIDE 06/17/2023 [facility]   BREAST BIOPSY Left 06/17/2023   [place] LT BREAST BX W LOC DEV EA ADD LESION IMG BX SPEC US GUIDE 06/17/2023 [facility]   BREAST BIOPSY Left 06/17/2023   [place] LT BREAST BX W LOC DEV EA ADD LESION IMG BX SPEC US GUIDE 06/17/2023 [facility]   BREAST BIOPSY Left 07/11/2023   Procedure: LEFT BREAST BIOPSY;  Surgeon: Lucretia Roers, MD;  Location: [facility];  Service: General;  Laterality: Left;   CESAREAN SECTION  2008   MM[facility]   CHOLECYSTECTOMY N/A 11/25/2014   Procedure: LAPAROSCOPIC CHOLECYSTECTOMY;  Surgeon: Franky Macho Md, MD;  Location: [facility];  Service: General;  Laterality: N/A;   ESOPHAGOGASTRODUODENOSCOPY N/A 12/21/2015   Dr. Jena Gauss: normal esophagus, non-bleeding erosive gastropathy, normal second portion of duodenum. Reactive gastropathy/chemical gastritis, negative H.pylori   ESOPHAGOGASTRODUODENOSCOPY (EGD) WITH PROPOFOL N/A 11/23/2020    Surgeon: Corbin Ade, MD; normal esophagus, small hiatal hernia, normal examined duodenum.   FEMUR FRACTURE SURGERY Left 1995   tree fell on her   HERNIA REPAIR     INCISION AND DRAINAGE ABSCESS Left 07/11/2023   Procedure: INCISION AND DRAINAGE ABSCESS, BREAST;  Surgeon: Lucretia Roers, MD;  Location: [facility];  Service: General;  Laterality: Left;   INCISIONAL HERNIA REPAIR N/A 01/29/2016   Procedure: Sherald Hess HERNIORRHAPHY WITH MESH;  Surgeon: Franky Macho, MD;  Location: [facility];  Service: General;  Laterality: N/A;   INSERTION OF MESH  01/29/2016   Procedure: INSERTION OF MESH;  Surgeon: Franky Macho, MD;  Location: [facility];  Service: General;;   KNEE ARTHROSCOPY Left    LUMBAR LAMINECTOMY/DECOMPRESSION MICRODISCECTOMY Right 05/21/2019   Procedure: Microdiscectomy - Lumbar four-Lumbar five - right;  Surgeon: Tia Alert, MD;  Location: [facility];  Service: Neurosurgery;  Laterality: Right;    TUBAL LIGATION      Current Outpatient Medications  Medication Sig Dispense Refill   albuterol (PROVENTIL) (2.5 MG/3ML) 0.083% nebulizer solution Take 3 mLs (2.5 mg total) by nebulization every 6 (six) hours as needed for wheezing or shortness of breath. 150 mL 1   albuterol (VENTOLIN HFA) 108 (90 Base) MCG/ACT inhaler Inhale 2 puffs into the lungs every 6 (six) hours as needed for wheezing or shortness of breath. 18 g 6   budesonide-formoterol (SYMBICORT) 80-4.5 MCG/ACT inhaler Inhale 2 puffs into the lungs 2 (two) times daily. (Patient taking differently: Inhale 2 puffs into the lungs 2 (two) times daily as needed (wheezing).) 1 each 1   gabapentin (NEURONTIN) 600 MG tablet Take 600 mg by mouth 3 (three) times daily.     linaclotide (LINZESS) 72 MCG capsule Take 1 capsule (72 mcg total) by mouth daily before breakfast. 30 capsule 5   lisinopril-hydrochlorothiazide (ZESTORETIC) 20-12.5 MG tablet Take 1 tablet by mouth daily.     lovastatin (MEVACOR) 20 MG tablet TAKE 1 TABLET(20 MG) BY MOUTH AT BEDTIME 90 tablet 0   methocarbamol (ROBAXIN) 500 MG tablet Take 1 tablet (500 mg total) by mouth 3 (three) times daily as needed for muscle spasms. (Patient taking differently: Take 500 mg by mouth in the morning and at bedtime.) 90 tablet 2   ondansetron (ZOFRAN-ODT) 4 MG disintegrating tablet Take 1 tablet (4 mg total) by mouth every 8 (eight) hours as needed for nausea or vomiting. 20 tablet 0   oxyCODONE-acetaminophen (PERCOCET/ROXICET) 5-325 MG tablet Take 1 tablet by mouth every 4 (four) hours as needed for moderate pain. (Patient taking differently: Take 1 tablet by mouth in the morning, at noon, in the evening, and at bedtime.) 30 tablet 0   OZEMPIC, 2 MG/DOSE, 8 MG/3ML SOPN Inject 2 mg into the skin once a week.     No current facility-administered medications for this visit.    Allergies as of 08/18/2023 - Review Complete 08/18/2023  Allergen Reaction Noted   Tramadol Other (See Comments)  05/21/2019   Nsaids  02/28/2023    Family History  Problem Relation Age of Onset   Hypertension Mother    Hyperlipidemia Mother    Fibromyalgia Mother    Other Mother        degenerative disc disease   Arthritis Mother    Kidney disease Mother    Diabetes Father    Hypertension Father    Hyperlipidemia Father    Heart disease Father 67   Heart attack Father    Stroke Father    Hyperlipidemia Brother    Hypertension Brother    Aneurysm Maternal Grandmother        AAA   Kidney disease Maternal Grandmother    Kidney disease Maternal Grandfather    Aneurysm Paternal Grandmother        AAA  Kidney disease Paternal Grandmother    Kidney disease Paternal Grandfather    Colon cancer Neg Hx    Inflammatory bowel disease Neg Hx     Social History   Socioeconomic History   Marital status: Divorced    Spouse name: Not on file   Number of children: 1   Years of education: 14   Highest education level: Not on file  Occupational History   Occupation: disability  Tobacco Use   Smoking status: Every Day    Current packs/day: 0.50    Average packs/day: 0.5 packs/day for 19.9 years (10.0 ttl pk-yrs)    Types: Cigarettes    Start date: 09/17/2003   Smokeless tobacco: Never  Vaping Use   Vaping status: Never Used  Substance and Sexual Activity   Alcohol use: No   Drug use: No   Sexual activity: Yes    Birth control/protection: Surgical    Comment: BTL  Other Topics Concern   Not on file  Social History Narrative   Disabled from nursing   Lives at home with daughter Karna Christmas   Social Determinants of Health   Financial Resource Strain: Low Risk  (12/27/2022)   Overall Financial Resource Strain (CARDIA)    Difficulty of Paying Living Expenses: Not hard at all  Food Insecurity: No Food Insecurity (12/27/2022)   Hunger Vital Sign    Worried About Running Out of Food in the Last Year: Never true    Ran Out of Food in the Last Year: Never true  Transportation Needs: No  Transportation Needs (12/27/2022)   PRAPARE - Administrator, Civil Service (Medical): No    Lack of Transportation (Non-Medical): No  Physical Activity: Inactive (12/27/2022)   Exercise Vital Sign    Days of Exercise per Week: 0 days    Minutes of Exercise per Session: 0 min  Stress: Stress Concern Present (12/27/2022)   [employer] of Occupational Health - Occupational Stress Questionnaire    Feeling of Stress : Very much  Social Connections: Moderately Isolated (12/27/2022)   Social Connection and Isolation Panel [NHANES]    Frequency of Communication with Friends and Family: More than three times a week    Frequency of Social Gatherings with Friends and Family: More than three times a week    Attends Religious Services: Never    Database administrator or Organizations: No    Attends Engineer, structural: Never    Marital Status: Living with partner    Review of Systems: Gen: Denies fever, chills, cold or flulike symptoms, presyncope, syncope. GI: See HPI Heme: See HPI  Physical Exam: BP 139/82 (BP Location: Right Arm, Patient Position: Sitting, Cuff Size: Large)   Pulse (!) 101   Temp 97.6 F (36.4 C) (Temporal)   Ht 5\' 5"  (1.651 m)   Wt 270 lb 9.6 oz (122.7 kg)   LMP 08/16/2023 (Approximate)   BMI 45.03 kg/m  General:   Alert and oriented. No distress noted. Pleasant and cooperative.  Head:  Normocephalic and atraumatic. Eyes:  Conjuctiva clear without scleral icterus.  Abdomen:  +BS, soft, and non-distended. Very mild TTP in RUQ. No rebound or guarding. No HSM or masses noted. Msk:  Symmetrical without gross deformities. Normal posture. Psych:  Normal mood and affect.    Assessment:  37 y.o. female with GI history of constipation, GERD, upper abdominal pain, bloating, small hiatal hernia per EGD in March 2022.  Prior cholecystectomy in 2016 and ventral hernia repair with mesh  in 2017.  She is presenting today with chief complaint of bloating,  constipation, RUQ pain.  IBS-C: Chronic.  Previously on Linzess, but discontinued as she was controlling her symptoms with activity a, but now with recurrent constipation and bloating.  No alarm symptoms. No recent medication or dietary changes.  As she is asking for something to clean her colon out to get reset, I have given her instructions for a MiraLAX bowel prep, then she will resume Linzess 72 mcg daily.  RUQ Pain:  May be related to IBS-C.  Prior cholecystectomy.  No associated nausea or vomiting.  No GERD symptoms.  No NSAIDs.  Offered RUQ ultrasound, but patient would like to hold off on this for now while focusing on IBS management.   Plan:  MiraLAX bowel prep.  Instructions provided on AVS. Resume Linzess 72 mcg daily after completing MiraLAX bowel prep.  Prescription sent to pharmacy. Patient will call in 1 week to let me know how she is feeling.  Persistent RUQ abdominal pain, will arrange RUQ ultrasound. Routine follow-up in 3 months.   Ermalinda Memos, PA-C [facility] 08/18/2023

## 2023-08-18 ENCOUNTER — Ambulatory Visit (INDEPENDENT_AMBULATORY_CARE_PROVIDER_SITE_OTHER): Payer: Medicaid Other | Admitting: Gastroenterology

## 2023-08-18 ENCOUNTER — Encounter: Payer: Self-pay | Admitting: Gastroenterology

## 2023-08-18 VITALS — BP 139/82 | HR 101 | Temp 97.6°F | Ht 65.0 in | Wt 270.6 lb

## 2023-08-18 DIAGNOSIS — K581 Irritable bowel syndrome with constipation: Secondary | ICD-10-CM | POA: Diagnosis not present

## 2023-08-18 DIAGNOSIS — R1011 Right upper quadrant pain: Secondary | ICD-10-CM | POA: Diagnosis not present

## 2023-08-18 MED ORDER — LINACLOTIDE 72 MCG PO CAPS: 72.0000 ug | ORAL_CAPSULE | Freq: Every day | ORAL | 5 refills | Status: DC

## 2023-08-18 NOTE — Patient Instructions (Signed)
To get your colon cleaned out, you can complete a MiraLAX bowel prep:  Day of prep:  CLEAR LIQUIDS ALL DAY  At 10:00 AM, take 2 DULCOLAX 5mg  tablets  At 12:00 PM, Mix 5 teaspoons of Miralax in any 4-6 ounces of CLEAR LIQUIDS (Gatorade) every hour for 5 hours until passing clear, watery stools. Be sure to drink 4 ounces of clear liquid 30 minutes after each dose of Miralax.   At 3:00 PM, take 2 Dulcolax 5mg  tablets  If stools are not clear & watery by 6:00 PM, take 5 teaspoons of Miralax every 30 minutes until stools are clear (no color)  After completing MiraLAX bowel prep, you may start Linzess 72 mcg daily to keep your bowels moving.  I have sent a prescription to your pharmacy.  Please let me know in 1 week how you are feeling.  If you continue to have right upper quadrant abdominal pain, we can arrange an ultrasound for you.  We will plan for 32-month routine follow-up, but I can see you sooner if needed.  Ermalinda Memos, PA-C [facility]

## 2023-08-28 ENCOUNTER — Encounter: Payer: Self-pay | Admitting: *Deleted

## 2023-08-28 ENCOUNTER — Other Ambulatory Visit: Payer: Self-pay

## 2023-08-28 ENCOUNTER — Encounter: Payer: Self-pay | Admitting: General Surgery

## 2023-08-28 ENCOUNTER — Ambulatory Visit (INDEPENDENT_AMBULATORY_CARE_PROVIDER_SITE_OTHER): Payer: Medicaid Other | Admitting: General Surgery

## 2023-08-28 VITALS — BP 136/87 | HR 107 | Temp 98.5°F | Resp 18 | Ht 65.0 in | Wt 264.0 lb

## 2023-08-28 DIAGNOSIS — N611 Abscess of the breast and nipple: Secondary | ICD-10-CM

## 2023-08-28 NOTE — Progress Notes (Signed)
[facility]  Doing well. Packing.   BP 136/87   Pulse (!) 107   Temp 98.5 F (36.9 C) (Oral)   Resp 18   Ht 5\' 5"  (1.651 m)   Wt 264 lb (119.7 kg)   LMP 08/16/2023 (Approximate)   SpO2 96%   BMI 43.93 kg/m  1cm area with purulent drainage, flushed, repacked  Patient s/p right breast abscess s/p drain and redrainage.  Continue packing. Call with issues.   Future Appointments  Date Time Provider Department Center  09/09/2023  9:30 AM Lucretia Roers, MD [facility] None    Algis Greenhouse, MD [facility] [address] [place] [place] [place], [place] [address] [phone] (office)

## 2023-08-28 NOTE — Patient Instructions (Signed)
Continue packing. Call with issues.

## 2023-08-30 NOTE — Progress Notes (Unsigned)
Referring Provider: Micki Riley, Harlin Rain, * Primary Care Physician:  Micki Riley, Harlin Rain, NP Primary GI Physician: Dr. Jena Gauss  Chief Complaint  Patient presents with   Follow-up    Follow up on IBS-C and pt's does want a [place]    HPI:   Patricia Stanton is a 37 y.o. female  with GI history of constipation, GERD, upper abdominal pain, bloating. Prior cholecystectomy in 2016 and ventral hernia repair with mesh in 2017. Last EGD in March 2022 with small hiatal hernia, otherwise normal exam. No prior colonoscopy.  She is presenting today for follow-up of IBS-C and RUQ pain.   Last seen in the office 08/18/2023.  Reported she had been doing well, controlling IBS symptoms with activia, but now with constipation and bloating but no alarm symptoms.  Had not taken Linzess 72 mcg in a long time.  Denied any recent medication or dietary changes.  She also noted RUQ abdominal pain for the last couple of weeks, possibly secondary to IBS.  Denied associated nausea, vomiting, reflux.  No NSAIDs.  Offered RUQ ultrasound, but patient preferred to focus on IBS management first.  Recommended MiraLAX bowel prep, then start Linzess 72 mcg daily.  Requested progress report in 1 week.   Progress report not received.  Today: IBS-C: Bowels moving every couple of days. Hard stools. Taking Linzess 72 mcg daily.   RUQ Pain:  RUQ pain continues. Worse after meals. Notices increased swelling in RUQ after meals. No nausea or vomiting. Has postprandial pressure and gas. Taking pepto bismol for this and has noted some black stools with this. Pressure can be relieved by belching or passing gas.  Denies any typical heartburn symptoms.  Has not been on pantoprazole and close to 2 years.  No NSAIDs.   IDA:  Hgb 9.1 on 10/24.  Heavy menstrual cycles. Feels tired. No brbpr.   No iron supplementation.   Still packing left breast wound.    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 (HCC) 03/25/2019   Mood disorder (HCC)    Obesity    Ovarian cyst    Tobacco abuse     Past Surgical History:  Procedure Laterality Date   BREAST BIOPSY Left 06/17/2023   [place] LT BREAST BX W LOC DEV 1ST LESION IMG BX SPEC US GUIDE 06/17/2023 [facility]   BREAST BIOPSY Left 06/17/2023   [place] LT BREAST BX W LOC DEV EA ADD LESION IMG BX SPEC US GUIDE 06/17/2023 [facility]   BREAST BIOPSY Left 06/17/2023   [place] LT BREAST BX W LOC DEV EA ADD LESION IMG BX SPEC US GUIDE 06/17/2023 [facility]   BREAST BIOPSY Left 07/11/2023   Procedure: LEFT BREAST BIOPSY;  Surgeon: Lucretia Roers, MD;  Location: [facility];  Service: General;  Laterality: Left;   CESAREAN SECTION  2008   [facility]   CHOLECYSTECTOMY N/A 11/25/2014   Procedure: LAPAROSCOPIC CHOLECYSTECTOMY;  Surgeon: Franky Macho Md, MD;  Location: [facility];  Service: General;  Laterality: N/A;   ESOPHAGOGASTRODUODENOSCOPY N/A 12/21/2015   Dr. Jena Gauss: normal  esophagus, non-bleeding erosive gastropathy, normal second portion of duodenum. Reactive gastropathy/chemical gastritis, negative H.pylori   ESOPHAGOGASTRODUODENOSCOPY (EGD) WITH PROPOFOL N/A 11/23/2020    Surgeon: Corbin Ade, MD; normal esophagus, small hiatal hernia, normal examined duodenum.   FEMUR FRACTURE SURGERY Left 1995   tree fell on her   HERNIA REPAIR     INCISION AND DRAINAGE ABSCESS Left 07/11/2023   Procedure: INCISION AND DRAINAGE ABSCESS, BREAST;  Surgeon: Lucretia Roers, MD;  Location: [facility];  Service: General;  Laterality: Left;   INCISIONAL HERNIA REPAIR N/A 01/29/2016   Procedure: Sherald Hess HERNIORRHAPHY WITH MESH;   Surgeon: Franky Macho, MD;  Location: [facility];  Service: General;  Laterality: N/A;   INSERTION OF MESH  01/29/2016   Procedure: INSERTION OF MESH;  Surgeon: Franky Macho, MD;  Location: [facility];  Service: General;;   KNEE ARTHROSCOPY Left    LUMBAR LAMINECTOMY/DECOMPRESSION MICRODISCECTOMY Right 05/21/2019   Procedure: Microdiscectomy - Lumbar four-Lumbar five - right;  Surgeon: Tia Alert, MD;  Location: [facility] OR;  Service: Neurosurgery;  Laterality: Right;   TUBAL LIGATION      Current Outpatient Medications  Medication Sig Dispense Refill   albuterol (PROVENTIL) (2.5 MG/3ML) 0.083% nebulizer solution Take 3 mLs (2.5 mg total) by nebulization every 6 (six) hours as needed for wheezing or shortness of breath. 150 mL 1   albuterol (VENTOLIN HFA) 108 (90 Base) MCG/ACT inhaler Inhale 2 puffs into the lungs every 6 (six) hours as needed for wheezing or shortness of breath. 18 g 6   budesonide-formoterol (SYMBICORT) 80-4.5 MCG/ACT inhaler Inhale 2 puffs into the lungs 2 (two) times daily. (Patient taking differently: Inhale 2 puffs into the lungs 2 (two) times daily as needed (wheezing).) 1 each 1   gabapentin (NEURONTIN) 600 MG tablet Take 600 mg by mouth 3 (three) times daily.     linaclotide (LINZESS) 145 MCG CAPS capsule Take 1 capsule (145 mcg total) by mouth daily before breakfast. 30 capsule 5   lisinopril-hydrochlorothiazide (ZESTORETIC) 20-12.5 MG tablet Take 1 tablet by mouth daily.     lovastatin (MEVACOR) 20 MG tablet TAKE 1 TABLET(20 MG) BY MOUTH AT BEDTIME 90 tablet 0   methocarbamol (ROBAXIN) 500 MG tablet Take 1 tablet (500 mg total) by mouth 3 (three) times daily as needed for muscle spasms. (Patient taking differently: Take 500 mg by mouth in the morning and at bedtime.) 90 tablet 2   ondansetron (ZOFRAN-ODT) 4 MG disintegrating tablet Take 1 tablet (4 mg total) by mouth every 8 (eight) hours as needed for nausea or vomiting. 20 tablet 0   oxyCODONE-acetaminophen (PERCOCET/ROXICET)  5-325 MG tablet Take 1 tablet by mouth every 4 (four) hours as needed for moderate pain. (Patient taking differently: Take 1 tablet by mouth in the morning, at noon, in the evening, and at bedtime.) 30 tablet 0   OZEMPIC, 2 MG/DOSE, 8 MG/3ML SOPN Inject 2 mg into the skin once a week.     pantoprazole (PROTONIX) 40 MG tablet Take 1 tablet (40 mg total) by mouth daily before breakfast. 90 tablet 1   No current facility-administered medications for this visit.    Allergies as of 09/01/2023 - Review Complete 09/01/2023  Allergen Reaction Noted   Tramadol Other (See Comments) 05/21/2019   Nsaids  02/28/2023    Family History  Problem Relation Age of Onset   Hypertension Mother    Hyperlipidemia Mother    Fibromyalgia Mother    Other Mother        degenerative  disc disease   Arthritis Mother    Kidney disease Mother    Diabetes Father    Hypertension Father    Hyperlipidemia Father    Heart disease Father 25   Heart attack Father    Stroke Father    Hyperlipidemia Brother    Hypertension Brother    Aneurysm Maternal Grandmother        AAA   Kidney disease Maternal Grandmother    Kidney disease Maternal Grandfather    Aneurysm Paternal Grandmother        AAA   Kidney disease Paternal Grandmother    Kidney disease Paternal Grandfather    Colon cancer Neg Hx    Inflammatory bowel disease Neg Hx     Social History   Socioeconomic History   Marital status: Divorced    Spouse name: Not on file   Number of children: 1   Years of education: 14   Highest education level: Not on file  Occupational History   Occupation: disability  Tobacco Use   Smoking status: Every Day    Current packs/day: 0.50    Average packs/day: 0.5 packs/day for 20.0 years (10.0 ttl pk-yrs)    Types: Cigarettes    Start date: 09/17/2003   Smokeless tobacco: Never  Vaping Use   Vaping status: Never Used  Substance and Sexual Activity   Alcohol use: No   Drug use: No   Sexual activity: Yes     Birth control/protection: Surgical    Comment: BTL  Other Topics Concern   Not on file  Social History Narrative   Disabled from nursing   Lives at home with daughter Karna Christmas   Social Drivers of Health   Financial Resource Strain: Low Risk  (12/27/2022)   Overall Financial Resource Strain (CARDIA)    Difficulty of Paying Living Expenses: Not hard at all  Food Insecurity: No Food Insecurity (12/27/2022)   Hunger Vital Sign    Worried About Running Out of Food in the Last Year: Never true    Ran Out of Food in the Last Year: Never true  Transportation Needs: No Transportation Needs (12/27/2022)   PRAPARE - Administrator, Civil Service (Medical): No    Lack of Transportation (Non-Medical): No  Physical Activity: Inactive (12/27/2022)   Exercise Vital Sign    Days of Exercise per Week: 0 days    Minutes of Exercise per Session: 0 min  Stress: Stress Concern Present (12/27/2022)   [employer] of Occupational Health - Occupational Stress Questionnaire    Feeling of Stress : Very much  Social Connections: Moderately Isolated (12/27/2022)   Social Connection and Isolation Panel [NHANES]    Frequency of Communication with Friends and Family: More than three times a week    Frequency of Social Gatherings with Friends and Family: More than three times a week    Attends Religious Services: Never    Database administrator or Organizations: No    Attends Engineer, structural: Never    Marital Status: Living with partner    Review of Systems: Gen: Denies fever, chills, cold or flulike symptoms, presyncope, syncope. CV: Denies chest pain, palpitations. Resp: Denies dyspnea, cough. GI: See HPI Heme: See HPI  Physical Exam: BP 127/73   Pulse 96   Temp 98.7 F (37.1 C)   Ht 5\' 5"  (1.651 m)   Wt 269 lb 3.2 oz (122.1 kg)   LMP 08/18/2023 (Approximate)   BMI 44.80 kg/m  General:  Alert and oriented. No distress noted. Pleasant and cooperative.  Head:   Normocephalic and atraumatic. Eyes:  Conjuctiva clear without scleral icterus. Heart:  S1, S2 present without murmurs appreciated. Lungs:  Clear to auscultation bilaterally. No wheezes, rales, or rhonchi. No distress.  Abdomen:  +BS, soft, and non-distended.  Mild TTP in RUQ region.  No rebound or guarding.  Mild abdominal wall asymmetry with right abdomen appearing slightly more prominent than left, but no discrete mass or lesion. Msk:  Symmetrical without gross deformities. Normal posture. Extremities:  Without edema. Neurologic:  Alert and  oriented x4 Psych:  Normal mood and affect.    Assessment:  37 year old female with history of type 2 diabetes, HTN, HLD, obesity, endometriosis, depression, anxiety, iron deficiency, menorrhagia, IBS-C, GERD, presenting today for follow-up of IBS and RUQ pain. Also discussed IDA.  IBS-C: Not adequately controlled with Linzess 72 mcg daily, likely influenced by pain medications.  Will increase to Linzess 145 mcg daily.  RUQ abdominal pain: Intermittent RUQ abdominal pain with swelling starting around Thanksgiving 2024.  Had denied any association with meals, but now reporting symptoms do worsen after meals with associated pressure and bloating that extends across the upper abdomen.  No nausea or vomiting.  Prior cholecystectomy.  Denies NSAIDs.  Etiology unclear.  Will plan to check RUQ ultrasound and update labs to evaluate for possible biliary abnormalities.  Also starting pantoprazole for possible gastritis/atypical GERD as she was previously on pantoprazole couple years ago and is currently reporting some improvement with Pepto-Bismol.  Will also be arranging an EGD for IDA as discussed below which also help to further evaluate RUQ pain if ultrasound and labs are unrevealing.  IDA: History of low iron saturation in 2022 but hemoglobin previously in the 12-13 range and suspected low iron was secondary to menorrhagia.  Most recent labs in October of  this year showed hemoglobin of 9.1.  She denies BRBPR.  Recently with black stools in the setting of Pepto-Bismol.  Continues with menorrhagia.  Does not take any iron supplementation.  EGD in 2022 with small hiatal hernia, otherwise normal exam.  No prior colonoscopy.  It is possible that menorrhagia is contributing to iron deficiency anemia, but considering significant decline in hemoglobin, I have recommended evaluating her GI tract with EGD and colonoscopy. Will also update CBC and iron panel. Notably, she is experiencing new onset RUQ abdominal pain as discussed above and could have gastritis, duodenitis, PUD, H. Pylori.    Plan:  CBC, CMP, iron panel. RUQ ultrasound Proceed with upper endoscopy + colonoscopy with propofol by Dr. Jena Gauss in near future. The risks, benefits, and alternatives have been discussed with the patient in detail. The patient states understanding and desires to proceed.  ASA 3 UPT Hold Ozempic x 1 week prior.  Start pantoprazole 40 mg daily 30 minutes before breakfast. Increase Linzess to 145 mcg daily. Follow-up after procedures.   Ermalinda Memos, PA-C [facility] 09/01/2023

## 2023-09-01 ENCOUNTER — Ambulatory Visit (INDEPENDENT_AMBULATORY_CARE_PROVIDER_SITE_OTHER): Payer: Medicaid Other | Admitting: Gastroenterology

## 2023-09-01 ENCOUNTER — Encounter: Payer: Self-pay | Admitting: *Deleted

## 2023-09-01 ENCOUNTER — Encounter: Payer: Self-pay | Admitting: Gastroenterology

## 2023-09-01 ENCOUNTER — Telehealth: Payer: Self-pay | Admitting: *Deleted

## 2023-09-01 VITALS — BP 127/73 | HR 96 | Temp 98.7°F | Ht 65.0 in | Wt 269.2 lb

## 2023-09-01 DIAGNOSIS — R140 Abdominal distension (gaseous): Secondary | ICD-10-CM

## 2023-09-01 DIAGNOSIS — N920 Excessive and frequent menstruation with regular cycle: Secondary | ICD-10-CM

## 2023-09-01 DIAGNOSIS — D509 Iron deficiency anemia, unspecified: Secondary | ICD-10-CM | POA: Diagnosis not present

## 2023-09-01 DIAGNOSIS — K581 Irritable bowel syndrome with constipation: Secondary | ICD-10-CM | POA: Diagnosis not present

## 2023-09-01 DIAGNOSIS — R1011 Right upper quadrant pain: Secondary | ICD-10-CM

## 2023-09-01 MED ORDER — LINACLOTIDE 145 MCG PO CAPS: 145.0000 ug | ORAL_CAPSULE | Freq: Every day | ORAL | 5 refills | Status: DC

## 2023-09-01 MED ORDER — PANTOPRAZOLE SODIUM 40 MG PO TBEC: 40.0000 mg | DELAYED_RELEASE_TABLET | Freq: Every day | ORAL | 1 refills | Status: DC

## 2023-09-01 MED ORDER — PEG 3350-KCL-NA BICARB-NACL 420 G PO SOLR: 4000.0000 mL | Freq: Once | ORAL | 0 refills | Status: AC

## 2023-09-01 NOTE — Patient Instructions (Signed)
Please have blood work completed at [facility].  We will arrange to have an ultrasound of your right upper abdomen at [facility].  We will also arrange for you to have an upper endoscopy and a colonoscopy in the near future with Dr. Jena Gauss at [facility].  Start pantoprazole 40 mg daily 30 minutes before breakfast.  I have sent a prescription to your pharmacy.  Increase Linzess to 145 mcg daily 30 minutes before breakfast.  I have sent a new prescription to your pharmacy.  We will call you with your results and any further recommendations.  I will plan to see you back in the office after your procedures.  It was good to see you again today!  I hope you have a fantastic Christmas and new year!  Ermalinda Memos, PA-C [facility]

## 2023-09-01 NOTE — Telephone Encounter (Signed)
PA approved via [facility] Authorization #: [id number] Oct 22, 2023 - Jan 20, 2024

## 2023-09-02 DIAGNOSIS — R1011 Right upper quadrant pain: Secondary | ICD-10-CM | POA: Diagnosis not present

## 2023-09-02 DIAGNOSIS — D509 Iron deficiency anemia, unspecified: Secondary | ICD-10-CM | POA: Diagnosis not present

## 2023-09-03 ENCOUNTER — Other Ambulatory Visit: Payer: Self-pay | Admitting: Gastroenterology

## 2023-09-03 ENCOUNTER — Encounter: Payer: Self-pay | Admitting: *Deleted

## 2023-09-03 DIAGNOSIS — D509 Iron deficiency anemia, unspecified: Secondary | ICD-10-CM

## 2023-09-03 LAB — CBC WITH DIFFERENTIAL/PLATELET
Absolute Lymphocytes: 2669 {cells}/uL (ref 850–3900)
Absolute Monocytes: 931 {cells}/uL (ref 200–950)
Basophils Absolute: 56 {cells}/uL (ref 0–200)
Basophils Relative: 0.4 %
Eosinophils Absolute: 125 {cells}/uL (ref 15–500)
Eosinophils Relative: 0.9 %
HCT: 35.7 % (ref 35.0–45.0)
Hemoglobin: 9.6 g/dL — ABNORMAL LOW (ref 11.7–15.5)
MCH: 17.2 pg — ABNORMAL LOW (ref 27.0–33.0)
MCHC: 26.9 g/dL — ABNORMAL LOW (ref 32.0–36.0)
MCV: 63.9 fL — ABNORMAL LOW (ref 80.0–100.0)
MPV: 9.7 fL (ref 7.5–12.5)
Monocytes Relative: 6.7 %
Neutro Abs: 10119 {cells}/uL — ABNORMAL HIGH (ref 1500–7800)
Neutrophils Relative %: 72.8 %
Platelets: 353 10*3/uL (ref 140–400)
RBC: 5.59 10*6/uL — ABNORMAL HIGH (ref 3.80–5.10)
RDW: 18.2 % — ABNORMAL HIGH (ref 11.0–15.0)
Total Lymphocyte: 19.2 %
WBC: 13.9 10*3/uL — ABNORMAL HIGH (ref 3.8–10.8)

## 2023-09-03 LAB — COMPLETE METABOLIC PANEL WITH GFR
AG Ratio: 1.4 (calc) (ref 1.0–2.5)
ALT: 16 U/L (ref 6–29)
AST: 16 U/L (ref 10–30)
Albumin: 3.8 g/dL (ref 3.6–5.1)
Alkaline phosphatase (APISO): 89 U/L (ref 31–125)
BUN/Creatinine Ratio: 13 (calc) (ref 6–22)
BUN: 6 mg/dL — ABNORMAL LOW (ref 7–25)
CO2: 20 mmol/L (ref 20–32)
Calcium: 9 mg/dL (ref 8.6–10.2)
Chloride: 101 mmol/L (ref 98–110)
Creat: 0.46 mg/dL — ABNORMAL LOW (ref 0.50–0.97)
Globulin: 2.7 g/dL (ref 1.9–3.7)
Glucose, Bld: 285 mg/dL — ABNORMAL HIGH (ref 65–99)
Potassium: 4.4 mmol/L (ref 3.5–5.3)
Sodium: 133 mmol/L — ABNORMAL LOW (ref 135–146)
Total Bilirubin: 0.4 mg/dL (ref 0.2–1.2)
Total Protein: 6.5 g/dL (ref 6.1–8.1)
eGFR: 126 mL/min/{1.73_m2} (ref >=60)

## 2023-09-03 LAB — IRON,TIBC AND FERRITIN PANEL
%SAT: 3 % — ABNORMAL LOW (ref 16–45)
Ferritin: 10 ng/mL — ABNORMAL LOW (ref 16–154)
Iron: 12 ug/dL — ABNORMAL LOW (ref 40–190)
TIBC: 468 ug/dL — ABNORMAL HIGH (ref 250–450)

## 2023-09-03 LAB — CBC MORPHOLOGY

## 2023-09-03 MED ORDER — FERROUS SULFATE 325 (65 FE) MG PO TBEC: 325.0000 mg | DELAYED_RELEASE_TABLET | Freq: Every day | ORAL | 5 refills | Status: DC

## 2023-09-04 ENCOUNTER — Ambulatory Visit (HOSPITAL_COMMUNITY)
Admission: RE | Admit: 2023-09-04 | Discharge: 2023-09-04 | Disposition: A | Discharge status: Home / Self Care | Payer: Medicaid Other | Source: Ambulatory Visit | Attending: Gastroenterology | Admitting: Gastroenterology

## 2023-09-04 DIAGNOSIS — K7689 Other specified diseases of liver: Secondary | ICD-10-CM | POA: Diagnosis not present

## 2023-09-04 DIAGNOSIS — R1011 Right upper quadrant pain: Secondary | ICD-10-CM | POA: Insufficient documentation

## 2023-09-09 ENCOUNTER — Ambulatory Visit (INDEPENDENT_AMBULATORY_CARE_PROVIDER_SITE_OTHER): Payer: Medicaid Other | Admitting: General Surgery

## 2023-09-09 VITALS — BP 168/90 | HR 96 | Temp 98.3°F | Resp 14 | Ht 65.0 in | Wt 263.0 lb

## 2023-09-09 DIAGNOSIS — N611 Abscess of the breast and nipple: Secondary | ICD-10-CM

## 2023-09-09 MED ORDER — FLUCONAZOLE 150 MG PO TABS: 150.0000 mg | ORAL_TABLET | Freq: Every day | ORAL | 0 refills | Status: DC

## 2023-09-09 MED ORDER — CLINDAMYCIN HCL 300 MG PO CAPS: 300.0000 mg | ORAL_CAPSULE | Freq: Three times a day (TID) | ORAL | 0 refills | Status: AC

## 2023-09-09 NOTE — Progress Notes (Signed)
[facility]  Drainage from the lateral breast site that is now opened up. Looks purulent. Last culture was negative so this could be sterile fluid.   Trying to decrease smoking as she is able to.  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 She still smoking. This area has opened up and is draining. Culture obtained.  Clindamycin to change up the antibiotic for 5 days. Will see what culture shows Fluconazole if she gets vaginal candidiasis as she has in the past- hold statin if takes   Pack areas with 1/4 inch iodoform.  Future Appointments  Date Time Provider Department Center  09/23/2023 10:15 AM Lucretia Roers, MD [facility] None  10/15/2023 12:45 PM [facility] PAT 1 [facility] None   Algis Greenhouse, MD [facility] [address] [place], [place] [address] [phone] (office)

## 2023-09-09 NOTE — Patient Instructions (Addendum)
Pack areas with 1/4 inch iodoform. Will send culture, the fluid could be sterile but will be safe and send antibiotic.  Fluconazole pending any vaginal candidiasis. Hold your statin on the says you take this medication.

## 2023-09-11 DIAGNOSIS — Z79899 Other long term (current) drug therapy: Secondary | ICD-10-CM | POA: Diagnosis not present

## 2023-09-11 DIAGNOSIS — Z3200 Encounter for pregnancy test, result unknown: Secondary | ICD-10-CM | POA: Diagnosis not present

## 2023-09-11 DIAGNOSIS — Z0189 Encounter for other specified special examinations: Secondary | ICD-10-CM | POA: Diagnosis not present

## 2023-09-11 DIAGNOSIS — N63 Unspecified lump in unspecified breast: Secondary | ICD-10-CM | POA: Diagnosis not present

## 2023-09-11 DIAGNOSIS — M539 Dorsopathy, unspecified: Secondary | ICD-10-CM | POA: Diagnosis not present

## 2023-09-11 DIAGNOSIS — M549 Dorsalgia, unspecified: Secondary | ICD-10-CM | POA: Diagnosis not present

## 2023-09-11 DIAGNOSIS — E119 Type 2 diabetes mellitus without complications: Secondary | ICD-10-CM | POA: Diagnosis not present

## 2023-09-12 LAB — WOUND CULTURE
MICRO NUMBER:: 15888364
SPECIMEN QUALITY:: ADEQUATE

## 2023-09-12 MED ORDER — SULFAMETHOXAZOLE-TRIMETHOPRIM 800-160 MG PO TABS: 1.0000 | ORAL_TABLET | Freq: Two times a day (BID) | ORAL | 0 refills | Status: AC

## 2023-09-12 NOTE — Progress Notes (Signed)
Wound growing out 2 species. Streptococcus agalactiae should be susceptible to clindamycin but Serratia marcescens has some resistance. This was not tested for. This was susceptible to bactrim. I have sent in bactrim and left the patient a VM.

## 2023-09-12 NOTE — Addendum Note (Signed)
Addended by: Algis Greenhouse on: 09/12/2023 10:21 AM   Modules accepted: Orders

## 2023-09-17 ENCOUNTER — Other Ambulatory Visit: Payer: Self-pay | Admitting: Gastroenterology

## 2023-09-17 DIAGNOSIS — D509 Iron deficiency anemia, unspecified: Secondary | ICD-10-CM

## 2023-09-17 MED ORDER — SLOW RELEASE IRON 160 (50 FE) MG PO TBCR: 160.0000 mg | EXTENDED_RELEASE_TABLET | Freq: Every day | ORAL | 5 refills | Status: DC

## 2023-09-18 ENCOUNTER — Ambulatory Visit: Payer: Medicaid Other | Admitting: General Surgery

## 2023-09-23 ENCOUNTER — Ambulatory Visit (INDEPENDENT_AMBULATORY_CARE_PROVIDER_SITE_OTHER): Payer: Medicaid Other | Admitting: General Surgery

## 2023-09-23 ENCOUNTER — Encounter: Payer: Self-pay | Admitting: General Surgery

## 2023-09-23 VITALS — BP 174/80 | HR 104 | Temp 98.2°F | Resp 16 | Ht 65.0 in | Wt 268.0 lb

## 2023-09-23 DIAGNOSIS — N611 Abscess of the breast and nipple: Secondary | ICD-10-CM

## 2023-09-23 MED ORDER — SULFAMETHOXAZOLE-TRIMETHOPRIM 800-160 MG PO TABS: 1.0000 | ORAL_TABLET | Freq: Two times a day (BID) | ORAL | 0 refills | Status: AC

## 2023-09-23 NOTE — Progress Notes (Signed)
 [facility]  Having drainage still. Wound is closing up and she is packing it. She is  trying to cut back smoking.  BP (!) 174/80   Pulse (!) 104   Temp 98.2 F (36.8 C)   Resp 16   Ht 5' 5 (1.651 m)   Wt 268 lb (121.6 kg)   LMP 08/18/2023 (Approximate)   SpO2 98%   BMI 44.60 kg/m  Medial opening with granulation and purulent drainage deep, probed with qtip and cleaned out with peroxide, packing replaced  Lateral opening probed with silver nitrate stick as it is not open enough to pack  Patient with recurrent breast abscesses. Reiterated that this is likely worse due to her smoking.   Continue to pack wound daily. Do qtip with peroxide to the inside wound. Use the silver nitrate sticks on the outside wound. Try to continue to reduce your smoking. Take the antibiotic given the purulent drainage anda the wound closing up.   Future Appointments  Date Time Provider Department Center  10/09/2023  1:45 PM Kallie Manuelita BROCKS, MD [facility] None  10/15/2023 12:45 PM [facility] PAT 1 [facility] None   Manuelita Kallie, MD [facility] [address] [place], [place] [address] [phone] (office)

## 2023-09-23 NOTE — Patient Instructions (Signed)
 Continue to pack wound daily. Do qtip with peroxide to the inside wound. Use the silver nitrate sticks on the outside wound. Try to continue to reduce your smoking. Take the antibiotic.

## 2023-10-09 ENCOUNTER — Encounter: Payer: Self-pay | Admitting: General Surgery

## 2023-10-09 ENCOUNTER — Ambulatory Visit: Payer: Medicaid Other | Admitting: General Surgery

## 2023-10-09 VITALS — BP 147/100 | HR 92 | Temp 98.3°F | Resp 16 | Ht 65.0 in | Wt 273.0 lb

## 2023-10-09 DIAGNOSIS — N611 Abscess of the breast and nipple: Secondary | ICD-10-CM

## 2023-10-09 NOTE — Patient Instructions (Addendum)
Continue packing. Call with issues.  Continue peroxide to the area and silver nitrate if needed to help area close down.  Smoking cessation will help.

## 2023-10-09 NOTE — Progress Notes (Signed)
[facility]  Still packing breast. Trying to quit smoking but still doing some.  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 Smoking is playing a role in this not healing. No antibiotics indicated now.   Continue packing. Call with issues.  Continue peroxide to the area and silver nitrate if needed to help area close down.  Smoking cessation will help.   Future Appointments  Date Time Provider Department Center  10/15/2023 12:45 PM [facility] PAT 1 [facility] None  10/28/2023 11:45 AM Lucretia Roers, MD [facility] None   Algis Greenhouse, MD [facility] [address] [place], [place] [address] [phone] (office)

## 2023-10-14 NOTE — Patient Instructions (Signed)
Patricia Stanton  10/14/2023     @PREFPERIOPPHARMACY @   Your procedure is scheduled on  10/22/2023.   Report to [facility] at  0700  A.M.   Call this number if you have problems the morning of surgery:  [phone]  If you experience any cold or flu symptoms such as cough, fever, chills, shortness of breath, etc. between now and your scheduled surgery, please notify us at the above number.   Remember:        Use your nebulizer and your inhalers before you come and bring your rescue inhaler with you.        DO NOT take any medications for diabetes the morning of your procedure.    Follow the diet and prep instructions given to you by the office.   You may drink clear liquids until 0500 am on 10/22/2023.    Clear liquids allowed are:                    Water, Juice (No red color; non-citric and without pulp; diabetics please choose diet or no sugar options), Carbonated beverages (diabetics please choose diet or no sugar options), Clear Tea (No creamer, milk, or cream, including half & half and powdered creamer), Black Coffee Only (No creamer, milk or cream, including half & half and powdered creamer), and Clear Sports drink (No red color; diabetics please choose diet or no sugar options)    Take these medicines the morning of surgery with A SIP OF WATER           gabapentin, methocarbamol, oxycodone(if needed), pantoprazole.     Do not wear jewelry, make-up or nail polish, including gel polish,  artificial nails, or any other type of covering on natural nails (fingers and  toes).  Do not wear lotions, powders, or perfumes, or deodorant.  Do not shave 48 hours prior to surgery.  Men may shave face and neck.  Do not bring valuables to the hospital.  [facility] is not responsible for any belongings or valuables.  Contacts, dentures or bridgework may not be worn into surgery.  Leave your suitcase in the car.  After surgery it may be brought to your room.  For  patients admitted to the hospital, discharge time will be determined by your treatment team.  Patients discharged the day of surgery will not be allowed to drive home and must have someone with them for 24 hours.    Special instructions:   DO NOT smoke tobacco or vape for 24 hours before your procedure.  Please read over the following fact sheets that you were given. Anesthesia Post-op Instructions and Care and Recovery After Surgery      Upper Endoscopy, Adult, Care After After the procedure, it is common to have a sore throat. It is also common to have: Mild stomach pain or discomfort. Bloating. Nausea. Follow these instructions at home: The instructions below may help you care for yourself at home. Your health care provider may give you more instructions. If you have questions, ask your health care provider. If you were given a sedative during the procedure, it can affect you for several hours. Do not drive or operate machinery until your health care provider says that it is safe. If you will be going home right after the procedure, plan to have a responsible adult: Take you home from the hospital or clinic. You will not be allowed to drive. Care for  you for the time you are told. Follow instructions from your health care provider about what you may eat and drink. Return to your normal activities as told by your health care provider. Ask your health care provider what activities are safe for you. Take over-the-counter and prescription medicines only as told by your health care provider. Contact a health care provider if you: Have a sore throat that lasts longer than one day. Have trouble swallowing. Have a fever. Get help right away if you: Vomit blood or your vomit looks like coffee grounds. Have bloody, black, or tarry stools. Have a very bad sore throat or you cannot swallow. Have difficulty breathing or very bad pain in your chest or abdomen. These symptoms may be an  emergency. Get help right away. Call 911. Do not wait to see if the symptoms will go away. Do not drive yourself to the hospital. Summary After the procedure, it is common to have a sore throat, mild stomach discomfort, bloating, and nausea. If you were given a sedative during the procedure, it can affect you for several hours. Do not drive until your health care provider says that it is safe. Follow instructions from your health care provider about what you may eat and drink. Return to your normal activities as told by your health care provider. This information is not intended to replace advice given to you by your health care provider. Make sure you discuss any questions you have with your health care provider. Document Revised: 12/12/2021 Document Reviewed: 12/12/2021 Elsevier Patient Education  2024 [employer]Colonoscopy, Adult, Care After The following information offers guidance on how to care for yourself after your procedure. Your health care provider may also give you more specific instructions. If you have problems or questions, contact your health care provider. What can I expect after the procedure? After the procedure, it is common to have: A small amount of blood in your stool for 24 hours after the procedure. Some gas. Mild cramping or bloating of your abdomen. Follow these instructions at home: Eating and drinking  Drink enough fluid to keep your urine pale yellow. Follow instructions from your health care provider about eating or drinking restrictions. Resume your normal diet as told by your health care provider. Avoid heavy or fried foods that are hard to digest. Activity Rest as told by your health care provider. Avoid sitting for a long time without moving. Get up to take short walks every 1-2 hours. This is important to improve blood flow and breathing. Ask for help if you feel weak or unsteady. Return to your normal activities as told by your health care provider.  Ask your health care provider what activities are safe for you. Managing cramping and bloating  Try walking around when you have cramps or feel bloated. If directed, apply heat to your abdomen as told by your health care provider. Use the heat source that your health care provider recommends, such as a moist heat pack or a heating pad. Place a towel between your skin and the heat source. Leave the heat on for 20-30 minutes. Remove the heat if your skin turns bright red. This is especially important if you are unable to feel pain, heat, or cold. You have a greater risk of getting burned. General instructions If you were given a sedative during the procedure, it can affect you for several hours. Do not drive or operate machinery until your health care provider says that it is safe. For the first 24  hours after the procedure: Do not sign important documents. Do not drink alcohol. Do your regular daily activities at a slower pace than normal. Eat soft foods that are easy to digest. Take over-the-counter and prescription medicines only as told by your health care provider. Keep all follow-up visits. This is important. Contact a health care provider if: You have blood in your stool 2-3 days after the procedure. Get help right away if: You have more than a small spotting of blood in your stool. You have large blood clots in your stool. You have swelling of your abdomen. You have nausea or vomiting. You have a fever. You have increasing pain in your abdomen that is not relieved with medicine. These symptoms may be an emergency. Get help right away. Call 911. Do not wait to see if the symptoms will go away. Do not drive yourself to the hospital. Summary After the procedure, it is common to have a small amount of blood in your stool. You may also have mild cramping and bloating of your abdomen. If you were given a sedative during the procedure, it can affect you for several hours. Do not drive  or operate machinery until your health care provider says that it is safe. Get help right away if you have a lot of blood in your stool, nausea or vomiting, a fever, or increased pain in your abdomen. This information is not intended to replace advice given to you by your health care provider. Make sure you discuss any questions you have with your health care provider. Document Revised: 10/15/2022 Document Reviewed: 04/25/2021 Elsevier Patient Education  2024 [employer] Inc.Monitored Anesthesia Care, Care After The following information offers guidance on how to care for yourself after your procedure. Your health care provider may also give you more specific instructions. If you have problems or questions, contact your health care provider. What can I expect after the procedure? After the procedure, it is common to have: Tiredness. Little or no memory about what happened during or after the procedure. Impaired judgment when it comes to making decisions. Nausea or vomiting. Some trouble with balance. Follow these instructions at home: For the time period you were told by your health care provider:  Rest. Do not participate in activities where you could fall or become injured. Do not drive or use machinery. Do not drink alcohol. Do not take sleeping pills or medicines that cause drowsiness. Do not make important decisions or sign legal documents. Do not take care of children on your own. Medicines Take over-the-counter and prescription medicines only as told by your health care provider. If you were prescribed antibiotics, take them as told by your health care provider. Do not stop using the antibiotic even if you start to feel better. Eating and drinking Follow instructions from your health care provider about what you may eat and drink. Drink enough fluid to keep your urine pale yellow. If you vomit: Drink clear fluids slowly and in small amounts as you are able. Clear fluids include  water, ice chips, low-calorie sports drinks, and fruit juice that has water added to it (diluted fruit juice). Eat light and bland foods in small amounts as you are able. These foods include bananas, applesauce, rice, lean meats, toast, and crackers. General instructions  Have a responsible adult stay with you for the time you are told. It is important to have someone help care for you until you are awake and alert. If you have sleep apnea, surgery and some medicines  can increase your risk for breathing problems. Follow instructions from your health care provider about wearing your sleep device: When you are sleeping. This includes during daytime naps. While taking prescription pain medicines, sleeping medicines, or medicines that make you drowsy. Do not use any products that contain nicotine or tobacco. These products include cigarettes, chewing tobacco, and vaping devices, such as e-cigarettes. If you need help quitting, ask your health care provider. Contact a health care provider if: You feel nauseous or vomit every time you eat or drink. You feel light-headed. You are still sleepy or having trouble with balance after 24 hours. You get a rash. You have a fever. You have redness or swelling around the IV site. Get help right away if: You have trouble breathing. You have new confusion after you get home. These symptoms may be an emergency. Get help right away. Call 911. Do not wait to see if the symptoms will go away. Do not drive yourself to the hospital. This information is not intended to replace advice given to you by your health care provider. Make sure you discuss any questions you have with your health care provider. Document Revised: 01/28/2022 Document Reviewed: 01/28/2022 Elsevier Patient Education  2024 [employer].

## 2023-10-15 ENCOUNTER — Encounter (HOSPITAL_COMMUNITY): Payer: Self-pay

## 2023-10-15 ENCOUNTER — Encounter (HOSPITAL_COMMUNITY)
Admission: RE | Admit: 2023-10-15 | Discharge: 2023-10-15 | Disposition: A | Discharge status: Home / Self Care | Payer: Medicaid Other | Source: Ambulatory Visit | Attending: Internal Medicine | Admitting: Internal Medicine

## 2023-10-15 VITALS — BP 158/88 | HR 98 | Temp 98.3°F | Resp 16 | Ht 65.0 in | Wt 273.0 lb

## 2023-10-15 DIAGNOSIS — Z01812 Encounter for preprocedural laboratory examination: Secondary | ICD-10-CM | POA: Diagnosis present

## 2023-10-15 DIAGNOSIS — Z01818 Encounter for other preprocedural examination: Secondary | ICD-10-CM

## 2023-10-15 LAB — POCT PREGNANCY, URINE: Preg Test, Ur: NEGATIVE

## 2023-10-16 DIAGNOSIS — M539 Dorsopathy, unspecified: Secondary | ICD-10-CM | POA: Diagnosis not present

## 2023-10-16 DIAGNOSIS — M549 Dorsalgia, unspecified: Secondary | ICD-10-CM | POA: Diagnosis not present

## 2023-10-16 DIAGNOSIS — E119 Type 2 diabetes mellitus without complications: Secondary | ICD-10-CM | POA: Diagnosis not present

## 2023-10-22 ENCOUNTER — Telehealth: Payer: Self-pay | Admitting: Internal Medicine

## 2023-10-22 ENCOUNTER — Ambulatory Visit (HOSPITAL_COMMUNITY)
Admission: RE | Admit: 2023-10-22 | Discharge: 2023-10-22 | Disposition: A | Discharge status: Home / Self Care | Payer: Medicaid Other | Attending: Internal Medicine | Admitting: Internal Medicine

## 2023-10-22 ENCOUNTER — Ambulatory Visit (HOSPITAL_BASED_OUTPATIENT_CLINIC_OR_DEPARTMENT_OTHER): Payer: Medicaid Other | Admitting: Certified Registered"

## 2023-10-22 ENCOUNTER — Encounter (HOSPITAL_COMMUNITY)
Admission: RE | Disposition: A | Discharge status: Home / Self Care | Payer: Self-pay | Source: Home / Self Care | Attending: Internal Medicine

## 2023-10-22 ENCOUNTER — Ambulatory Visit (HOSPITAL_COMMUNITY): Payer: Medicaid Other | Admitting: Certified Registered"

## 2023-10-22 DIAGNOSIS — Z6841 Body Mass Index (BMI) 40.0 and over, adult: Secondary | ICD-10-CM | POA: Insufficient documentation

## 2023-10-22 DIAGNOSIS — R1011 Right upper quadrant pain: Secondary | ICD-10-CM | POA: Diagnosis not present

## 2023-10-22 DIAGNOSIS — K2950 Unspecified chronic gastritis without bleeding: Secondary | ICD-10-CM | POA: Diagnosis not present

## 2023-10-22 DIAGNOSIS — N189 Chronic kidney disease, unspecified: Secondary | ICD-10-CM | POA: Diagnosis not present

## 2023-10-22 DIAGNOSIS — I10 Essential (primary) hypertension: Secondary | ICD-10-CM

## 2023-10-22 DIAGNOSIS — F17210 Nicotine dependence, cigarettes, uncomplicated: Secondary | ICD-10-CM

## 2023-10-22 DIAGNOSIS — J45909 Unspecified asthma, uncomplicated: Secondary | ICD-10-CM | POA: Diagnosis not present

## 2023-10-22 DIAGNOSIS — E1122 Type 2 diabetes mellitus with diabetic chronic kidney disease: Secondary | ICD-10-CM | POA: Diagnosis not present

## 2023-10-22 DIAGNOSIS — B9681 Helicobacter pylori [H. pylori] as the cause of diseases classified elsewhere: Secondary | ICD-10-CM | POA: Diagnosis not present

## 2023-10-22 DIAGNOSIS — E119 Type 2 diabetes mellitus without complications: Secondary | ICD-10-CM | POA: Diagnosis not present

## 2023-10-22 DIAGNOSIS — Z79899 Other long term (current) drug therapy: Secondary | ICD-10-CM | POA: Diagnosis not present

## 2023-10-22 DIAGNOSIS — K449 Diaphragmatic hernia without obstruction or gangrene: Secondary | ICD-10-CM | POA: Insufficient documentation

## 2023-10-22 DIAGNOSIS — K760 Fatty (change of) liver, not elsewhere classified: Secondary | ICD-10-CM | POA: Insufficient documentation

## 2023-10-22 DIAGNOSIS — K219 Gastro-esophageal reflux disease without esophagitis: Secondary | ICD-10-CM | POA: Insufficient documentation

## 2023-10-22 DIAGNOSIS — D509 Iron deficiency anemia, unspecified: Secondary | ICD-10-CM | POA: Diagnosis not present

## 2023-10-22 DIAGNOSIS — I129 Hypertensive chronic kidney disease with stage 1 through stage 4 chronic kidney disease, or unspecified chronic kidney disease: Secondary | ICD-10-CM | POA: Diagnosis not present

## 2023-10-22 DIAGNOSIS — E66813 Obesity, class 3: Secondary | ICD-10-CM | POA: Insufficient documentation

## 2023-10-22 DIAGNOSIS — K259 Gastric ulcer, unspecified as acute or chronic, without hemorrhage or perforation: Secondary | ICD-10-CM | POA: Diagnosis not present

## 2023-10-22 DIAGNOSIS — K269 Duodenal ulcer, unspecified as acute or chronic, without hemorrhage or perforation: Secondary | ICD-10-CM | POA: Diagnosis not present

## 2023-10-22 DIAGNOSIS — Z7984 Long term (current) use of oral hypoglycemic drugs: Secondary | ICD-10-CM | POA: Insufficient documentation

## 2023-10-22 DIAGNOSIS — K581 Irritable bowel syndrome with constipation: Secondary | ICD-10-CM | POA: Diagnosis not present

## 2023-10-22 HISTORY — PX: COLONOSCOPY WITH PROPOFOL: SHX5780

## 2023-10-22 HISTORY — PX: ESOPHAGOGASTRODUODENOSCOPY (EGD) WITH PROPOFOL: SHX5813

## 2023-10-22 HISTORY — PX: BIOPSY: SHX5522

## 2023-10-22 LAB — GLUCOSE, CAPILLARY: Glucose-Capillary: 216 mg/dL — ABNORMAL HIGH (ref 70–99)

## 2023-10-22 SURGERY — COLONOSCOPY WITH PROPOFOL
Anesthesia: General

## 2023-10-22 MED ORDER — PROPOFOL 500 MG/50ML IV EMUL
INTRAVENOUS | Status: DC | PRN
  Administered 2023-10-22: 150 ug/kg/min via INTRAVENOUS

## 2023-10-22 MED ORDER — LACTATED RINGERS IV SOLN: INTRAVENOUS | Status: DC | PRN

## 2023-10-22 MED ORDER — LIDOCAINE HCL (PF) 2 % IJ SOLN
INTRAMUSCULAR | Status: DC | PRN
  Administered 2023-10-22: 100 mg via INTRADERMAL

## 2023-10-22 MED ORDER — SODIUM CHLORIDE 0.9% FLUSH: 3.0000 mL | Freq: Two times a day (BID) | INTRAVENOUS | Status: DC

## 2023-10-22 MED ORDER — STERILE WATER FOR IRRIGATION IR SOLN
Status: DC | PRN
  Administered 2023-10-22: 100 mL

## 2023-10-22 MED ORDER — SODIUM CHLORIDE 0.9% FLUSH: 3.0000 mL | INTRAVENOUS | Status: DC | PRN

## 2023-10-22 MED ORDER — PROPOFOL 10 MG/ML IV BOLUS
INTRAVENOUS | Status: DC | PRN
  Administered 2023-10-22: 100 mg via INTRAVENOUS

## 2023-10-22 NOTE — Transfer of Care (Signed)
 Immediate Anesthesia Transfer of Care Note  Patient: Patricia Stanton  Procedure(s) Performed: COLONOSCOPY WITH PROPOFOL  ESOPHAGOGASTRODUODENOSCOPY (EGD) WITH PROPOFOL  BIOPSY  Patient Location: Short Stay  Anesthesia Type:General  Level of Consciousness: awake, alert , and oriented  Airway & Oxygen Therapy: Patient Spontanous Breathing  Post-op Assessment: Report given to RN and Post -op Vital signs reviewed and stable  Post vital signs: Reviewed and stable  Last Vitals:  Vitals Value Taken Time  BP    Temp    Pulse    Resp    SpO2      Last Pain:  Vitals:   10/22/23 0736  PainSc: 0-No pain         Complications: No notable events documented.

## 2023-10-22 NOTE — Anesthesia Postprocedure Evaluation (Signed)
 Anesthesia Post Note  Patient: Patricia Stanton  Procedure(s) Performed: COLONOSCOPY WITH PROPOFOL  ESOPHAGOGASTRODUODENOSCOPY (EGD) WITH PROPOFOL  BIOPSY  Patient location during evaluation: PACU Anesthesia Type: General Level of consciousness: awake and alert Pain management: pain level controlled Vital Signs Assessment: post-procedure vital signs reviewed and stable Respiratory status: spontaneous breathing, nonlabored ventilation, respiratory function stable and patient connected to nasal cannula oxygen Cardiovascular status: blood pressure returned to baseline and stable Postop Assessment: no apparent nausea or vomiting Anesthetic complications: no   There were no known notable events for this encounter.   Last Vitals:  Vitals:   10/22/23 0715 10/22/23 0817  BP:  (!) 111/57  Pulse: 98 95  Resp: 17 (!) 28  Temp:  36.6 C  SpO2: 96%     Last Pain:  Vitals:   10/22/23 0817  TempSrc: Oral  PainSc: 0-No pain                 Jaisen Wiltrout L Elois Averitt

## 2023-10-22 NOTE — Op Note (Signed)
 [facility] Patient Name: Patricia Stanton Procedure Date: 10/22/2023 7:12 AM MRN: [id number] Date of Birth: [birth year] Attending MD: Lamar Ozell Hollingshead , MD, [id number] CSN: [id number] Age: 38 Admit Type: Outpatient Procedure:                Upper GI endoscopy Indications:              Abdominal pain in the right upper quadrant Providers:                Lamar Ozell Hollingshead, MD, Devere Lodge, Jon Loge Referring MD:              Medicines:                Propofol  per Anesthesia Complications:            No immediate complications. Estimated Blood Loss:     Estimated blood loss was minimal. Procedure:                Pre-Anesthesia Assessment:                           - Prior to the procedure, a History and Physical                            was performed, and patient medications and                            allergies were reviewed. The patient's tolerance of                            previous anesthesia was also reviewed. The risks                            and benefits of the procedure and the sedation                            options and risks were discussed with the patient.                            All questions were answered, and informed consent                            was obtained. Prior Anticoagulants: The patient has                            taken no anticoagulant or antiplatelet agents. ASA                            Grade Assessment: III - A patient with severe                            systemic disease. After reviewing the risks and  benefits, the patient was deemed in satisfactory                            condition to undergo the procedure.                           After obtaining informed consent, the endoscope was                            passed under direct vision. Throughout the                            procedure, the patient's blood pressure, pulse, and                             oxygen saturations were monitored continuously. The                            [id number]-H190 ([id number]) scope was introduced through the                            mouth, and advanced to the second part of duodenum.                            The upper GI endoscopy was accomplished without                            difficulty. The patient tolerated the procedure                            well. Scope In: 7:42:24 AM Scope Out: 7:53:23 AM Total Procedure Duration: 0 hours 10 minutes 59 seconds  Findings:      The examined esophagus was normal. Gastric cavity empty.      A small hiatal hernia was present. Single 1 cm stellate erosion in the       antrum. No ulcer or infiltrating process identified. Patent pylorus.      Examination of bulb second third portion revealed scattered duodenal       erosions.      Biopsy of the abnormal appearing antrum and duodenum were taken for       histologic study Impression:               - Normal esophagus.                           - Small hiatal hernia. Gastric and duodenal                            erosions. Status post biopsy Moderate Sedation:      Moderate (conscious) sedation was personally administered by an       anesthesia professional. The following parameters were monitored: oxygen       saturation, heart rate, blood pressure, respiratory rate, EKG, adequacy       of pulmonary ventilation, and response to care. Recommendation:           - Patient  has a contact number available for                            emergencies. The signs and symptoms of potential                            delayed complications were discussed with the                            patient. Return to normal activities tomorrow.                            Written discharge instructions were provided to the                            patient.                           - Advance diet as tolerated. Follow-up on                            pathology. Office visit with us  in the  near future                            as already scheduled. See colonoscopy report                           - Continue present medications.                           - Return to GI office (date not yet determined).                            See colonoscopy report. Procedure Code(s):        --- Professional ---                           [id number], Esophagogastroduodenoscopy, flexible,                            transoral; diagnostic, including collection of                            specimen(s) by brushing or washing, when performed                            (separate procedure) Diagnosis Code(s):        --- Professional ---                           K44.9, Diaphragmatic hernia without obstruction or                            gangrene  R10.11, Right upper quadrant pain CPT copyright 2022 American Medical Association. All rights reserved. The codes documented in this report are preliminary and upon coder review may  be revised to meet current compliance requirements. Lamar HERO. Heriberto Stmartin, MD Lamar Ozell Hollingshead, MD 10/22/2023 8:17:31 AM This report has been signed electronically. Number of Addenda: 0

## 2023-10-22 NOTE — Telephone Encounter (Signed)
 Dr. Riley Cheadle did her procedure today and asked that we schedule the follow up appt with Shana Daring.  I sent patient a mychart message and then she called to schedule and left a message.  I called her back and had to leave another message.

## 2023-10-22 NOTE — Anesthesia Preprocedure Evaluation (Addendum)
 Anesthesia Evaluation  Patient identified by MRN, date of birth, ID band Patient awake    Reviewed: Allergy & Precautions, NPO status , Patient's Chart, lab work & pertinent test results  History of Anesthesia Complications Negative for: history of anesthetic complications  Airway Mallampati: III  TM Distance: >3 FB Neck ROM: Full    Dental  (+) Dental Advisory Given, Teeth Intact   Pulmonary asthma , Current SmokerPatient did not abstain from smoking. Stop Bang 4   Pulmonary exam normal breath sounds clear to auscultation       Cardiovascular Exercise Tolerance: Poor hypertension, Pt. on medications Normal cardiovascular exam Rhythm:Regular Rate:Tachycardia     Neuro/Psych  Headaches PSYCHIATRIC DISORDERS Anxiety Depression     Neuromuscular disease    GI/Hepatic ,GERD  Medicated,,  Endo/Other  diabetes, Well Controlled, Type 2, Oral Hypoglycemic Agents  Class 3 obesityAdrenal tumor  Renal/GU Renal disease     Musculoskeletal  (+) Arthritis ,    Abdominal Normal abdominal exam  (+)   Peds  Hematology  (+) Blood dyscrasia, anemia Hgb 9.6   Anesthesia Other Findings   Reproductive/Obstetrics                             Anesthesia Physical Anesthesia Plan  ASA: 3  Anesthesia Plan: General   Post-op Pain Management: Minimal or no pain anticipated   Induction: Intravenous  PONV Risk Score and Plan: Propofol  infusion  Airway Management Planned: Nasal Cannula and Natural Airway  Additional Equipment: None  Intra-op Plan:   Post-operative Plan:   Informed Consent: I have reviewed the patients History and Physical, chart, labs and discussed the procedure including the risks, benefits and alternatives for the proposed anesthesia with the patient or authorized representative who has indicated his/her understanding and acceptance.     Dental advisory given  Plan Discussed with:  CRNA  Anesthesia Plan Comments:         Anesthesia Quick Evaluation

## 2023-10-22 NOTE — H&P (Signed)
 @LOGO @   Primary Care Physician:  Jerel Gee, NP Primary Gastroenterologist:  Dr. Shaaron  Pre-Procedure History & Physical: HPI:  Patricia Stanton is a 38 y.o. female here for further evaluation of right upper quadrant abdominal pain and iron  deficiency anemia via EGD and colonoscopy.  Denies dysphagia.  Fatty liver on ultrasound no biliary dilation.  LFTs normal.  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) 03/25/2019   Mood disorder (HCC)    Obesity    Ovarian cyst    Tobacco abuse     Past Surgical History:  Procedure Laterality Date   BREAST BIOPSY Left 06/17/2023   US  LT BREAST BX W LOC DEV 1ST LESION IMG BX SPEC US  GUIDE 06/17/2023 [facility]   BREAST BIOPSY Left 06/17/2023   US  LT BREAST BX W LOC DEV EA ADD LESION IMG BX SPEC US  GUIDE 06/17/2023 [facility]   BREAST BIOPSY Left 06/17/2023   US  LT BREAST BX W LOC DEV EA ADD LESION IMG BX SPEC US  GUIDE 06/17/2023 [facility]   BREAST BIOPSY Left 07/11/2023   Procedure: LEFT BREAST BIOPSY;  Surgeon: Kallie Manuelita BROCKS, MD;  Location: [facility];  Service: General;  Laterality: Left;   CESAREAN SECTION  2008   [facility]   CHOLECYSTECTOMY N/A 11/25/2014   Procedure: LAPAROSCOPIC CHOLECYSTECTOMY;  Surgeon: Oneil Budge Md, MD;  Location: [facility];  Service: General;  Laterality: N/A;   ESOPHAGOGASTRODUODENOSCOPY N/A 12/21/2015   Dr. Shaaron: normal esophagus, non-bleeding erosive gastropathy, normal second portion of duodenum. Reactive gastropathy/chemical gastritis, negative H.pylori   ESOPHAGOGASTRODUODENOSCOPY (EGD) WITH PROPOFOL  N/A 11/23/2020    Surgeon:  Shaaron Lamar HERO, MD; normal esophagus, small hiatal hernia, normal examined duodenum.   FEMUR FRACTURE SURGERY Left 1995   tree fell on her   HERNIA REPAIR     INCISION AND DRAINAGE ABSCESS Left 07/11/2023   Procedure: INCISION AND DRAINAGE ABSCESS, BREAST;  Surgeon: Kallie Manuelita BROCKS, MD;  Location: [facility];  Service: General;  Laterality: Left;   INCISIONAL HERNIA REPAIR N/A 01/29/2016   Procedure: SHIRLENE HERNIORRHAPHY WITH MESH;  Surgeon: Oneil Budge, MD;  Location: [facility];  Service: General;  Laterality: N/A;   INSERTION OF MESH  01/29/2016   Procedure: INSERTION OF MESH;  Surgeon: Oneil Budge, MD;  Location: [facility];  Service: General;;   KNEE ARTHROSCOPY Left    LUMBAR LAMINECTOMY/DECOMPRESSION MICRODISCECTOMY Right 05/21/2019   Procedure: Microdiscectomy - Lumbar four-Lumbar five - right;  Surgeon: Joshua Alm RAMAN, MD;  Location: [facility];  Service: Neurosurgery;  Laterality: Right;   TUBAL LIGATION      Prior to Admission medications   Medication Sig Start Date End Date Taking? Authorizing Provider  albuterol  (VENTOLIN  HFA) 108 (90 Base) MCG/ACT inhaler Inhale 2 puffs into the lungs every 6 (six) hours as needed for wheezing or shortness of breath. 07/17/21  Yes Ijaola, Onyeje M, NP  budesonide -formoterol  (SYMBICORT ) 80-4.5 MCG/ACT inhaler Inhale 2 puffs into the lungs 2 (two) times daily. Patient taking differently: Inhale 2 puffs into the lungs 2 (two) times daily as needed (wheezing). 01/03/23  Yes Raspet, Erin K, PA-C  gabapentin  (NEURONTIN ) 600 MG tablet Take 600 mg by mouth 3 (three) times daily. 11/05/20  Yes [provider]  linaclotide  (LINZESS ) 145 MCG CAPS capsule Take 1 capsule (145 mcg total) by mouth daily before breakfast. 09/01/23  Yes Rudy, Kristen S, PA-C  methocarbamol  (ROBAXIN ) 500 MG tablet Take 1 tablet (500 mg total) by mouth 3 (three) times daily as needed for muscle spasms. Patient taking differently: Take 500 mg by mouth in the morning and at  bedtime. 06/28/21  Yes Hawks, Christy A, FNP  ondansetron  (ZOFRAN -ODT) 4 MG disintegrating tablet Take 1 tablet (4 mg total) by mouth every 8 (eight) hours as needed for nausea or vomiting. 12/17/22  Yes Chandra Harlene LABOR, NP  oxyCODONE -acetaminophen  (PERCOCET/ROXICET) 5-325 MG tablet Take 1 tablet by mouth every 4 (four) hours as needed for moderate pain. Patient taking differently: Take 1 tablet by mouth in the morning, at noon, in the evening, and at bedtime. 05/21/19  Yes Joshua Alm Hamilton, MD  pantoprazole  (PROTONIX ) 40 MG tablet Take 1 tablet (40 mg total) by mouth daily before breakfast. 09/01/23  Yes Rudy Josette RAMAN, PA-C  albuterol  (PROVENTIL ) (2.5 MG/3ML) 0.083% nebulizer solution Take 3 mLs (2.5 mg total) by nebulization every 6 (six) hours as needed for wheezing or shortness of breath. 06/25/21   Lavell Bari LABOR, FNP  ferrous sulfate  (SLOW RELEASE IRON ) 160 (50 Fe) MG TBCR SR tablet Take 1 tablet (160 mg total) by mouth daily. 09/17/23   Rudy Josette RAMAN, PA-C  fluconazole  (DIFLUCAN ) 150 MG tablet Take 1 tablet (150 mg total) by mouth daily. Take one tablet for vaginal itching related to antibiotic. May repeat in 3 days if symptoms not resolved. Hold your statin on the days you take this medication. 09/09/23   Kallie Manuelita BROCKS, MD  metFORMIN  (GLUCOPHAGE ) 500 MG tablet Take by mouth 2 (two) times daily with a meal.    [provider]    Allergies as of 09/01/2023 - Review Complete 09/01/2023  Allergen Reaction Noted   Tramadol  Other (See Comments) 05/21/2019   Nsaids  02/28/2023    Family History  Problem Relation Age of Onset   Hypertension Mother    Hyperlipidemia Mother    Fibromyalgia Mother    Other Mother        degenerative disc disease   Arthritis Mother    Kidney disease Mother    Diabetes Father    Hypertension Father    Hyperlipidemia Father    Heart disease Father 21   Heart attack Father    Stroke Father    Hyperlipidemia Brother    Hypertension  Brother    Aneurysm Maternal Grandmother        AAA   Kidney disease Maternal Grandmother    Kidney disease Maternal Grandfather    Aneurysm Paternal Grandmother        AAA   Kidney disease Paternal Grandmother    Kidney disease Paternal Grandfather    Colon cancer Neg Hx    Inflammatory bowel disease Neg Hx     Social History   Socioeconomic History   Marital status: Divorced    Spouse name: Not on file   Number of children: 1   Years of education: 14   Highest education level: Not on file  Occupational History   Occupation: disability  Tobacco Use   Smoking status: Every Day    Current packs/day: 0.50    Average packs/day: 0.5 packs/day for 20.1 years (10.0 ttl  pk-yrs)    Types: Cigarettes    Start date: 09/17/2003   Smokeless tobacco: Never  Vaping Use   Vaping status: Never Used  Substance and Sexual Activity   Alcohol use: No   Drug use: No   Sexual activity: Yes    Birth control/protection: Surgical    Comment: BTL  Other Topics Concern   Not on file  Social History Narrative   Disabled from nursing   Lives at home with daughter Mace   Social Drivers of Health   Financial Resource Strain: Low Risk  (12/27/2022)   Overall Financial Resource Strain (CARDIA)    Difficulty of Paying Living Expenses: Not hard at all  Food Insecurity: No Food Insecurity (12/27/2022)   Hunger Vital Sign    Worried About Running Out of Food in the Last Year: Never true    Ran Out of Food in the Last Year: Never true  Transportation Needs: No Transportation Needs (12/27/2022)   PRAPARE - Administrator, Civil Service (Medical): No    Lack of Transportation (Non-Medical): No  Physical Activity: Inactive (12/27/2022)   Exercise Vital Sign    Days of Exercise per Week: 0 days    Minutes of Exercise per Session: 0 min  Stress: Stress Concern Present (12/27/2022)   [employer] of Occupational Health - Occupational Stress Questionnaire    Feeling of Stress : Very  much  Social Connections: Moderately Isolated (12/27/2022)   Social Connection and Isolation Panel [NHANES]    Frequency of Communication with Friends and Family: More than three times a week    Frequency of Social Gatherings with Friends and Family: More than three times a week    Attends Religious Services: Never    Database Administrator or Organizations: No    Attends Banker Meetings: Never    Marital Status: Living with partner  Intimate Partner Violence: Not At Risk (12/27/2022)   Humiliation, Afraid, Rape, and Kick questionnaire    Fear of Current or Ex-Partner: No    Emotionally Abused: No    Physically Abused: No    Sexually Abused: No    Review of Systems: See HPI, otherwise negative ROS  Physical Exam: BP (!) 156/80   Pulse 98   Temp 98.4 F (36.9 C)   Resp 17   SpO2 96%  General:   Alert,  Well-developed, well-nourished, pleasant and cooperative in NAD Neck:  Supple; no masses or thyromegaly. No significant cervical adenopathy. Lungs:  Clear throughout to auscultation.   No wheezes, crackles, or rhonchi. No acute distress. Heart:  Regular rate and rhythm; no murmurs, clicks, rubs,  or gallops. Abdomen: Non-distended, normal bowel sounds.  Soft and nontender without appreciable mass or hepatosplenomegaly.   Impression/Plan: Morbidly obese 38 year old lady with right upper quadrant abdominal pain has a positional component.  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  Profound iron  deficiency anemia in setting of heavy menstrual losses.  Denies dysphagia.  I have offered the patient EGD along with a diagnostic colonoscopy today.  The risks, benefits, limitations, imponderables and alternatives regarding both EGD and colonoscopy have been reviewed with the patient. Questions have been answered. All parties agreeable.       Notice: This dictation was prepared with Dragon dictation along with smaller phrase technology. Any transcriptional  errors that result from this process are unintentional and may not be corrected upon review.

## 2023-10-22 NOTE — Discharge Instructions (Signed)
 EGD Discharge instructions Please read the instructions outlined below and refer to this sheet in the next few weeks. These discharge instructions provide you with general information on caring for yourself after you leave the hospital. Your doctor may also give you specific instructions. While your treatment has been planned according to the most current medical practices available, unavoidable complications occasionally occur. If you have any problems or questions after discharge, please call your doctor. ACTIVITY You may resume your regular activity but move at a slower pace for the next 24 hours.  Take frequent rest periods for the next 24 hours.  Walking will help expel (get rid of) the air and reduce the bloated feeling in your abdomen.  No driving for 24 hours (because of the anesthesia (medicine) used during the test).  You may shower.  Do not sign any important legal documents or operate any machinery for 24 hours (because of the anesthesia used during the test).  NUTRITION Drink plenty of fluids.  You may resume your normal diet.  Begin with a light meal and progress to your normal diet.  Avoid alcoholic beverages for 24 hours or as instructed by your caregiver.  MEDICATIONS You may resume your normal medications unless your caregiver tells you otherwise.  WHAT YOU CAN EXPECT TODAY You may experience abdominal discomfort such as a feeling of fullness or "gas" pains.  FOLLOW-UP Your doctor will discuss the results of your test with you.  SEEK IMMEDIATE MEDICAL ATTENTION IF ANY OF THE FOLLOWING OCCUR: Excessive nausea (feeling sick to your stomach) and/or vomiting.  Severe abdominal pain and distention (swelling).  Trouble swallowing.  Temperature over 101 F (37.8 C).  Rectal bleeding or vomiting of blood.     Colonoscopy Discharge Instructions  Read the instructions outlined below and refer to this sheet in the next few weeks. These discharge instructions provide you with  general information on caring for yourself after you leave the hospital. Your doctor may also give you specific instructions. While your treatment has been planned according to the most current medical practices available, unavoidable complications occasionally occur. If you have any problems or questions after discharge, call Dr. Shaaron at [phone]. ACTIVITY You may resume your regular activity, but move at a slower pace for the next 24 hours.  Take frequent rest periods for the next 24 hours.  Walking will help get rid of the air and reduce the bloated feeling in your belly (abdomen).  No driving for 24 hours (because of the medicine (anesthesia) used during the test).   Do not sign any important legal documents or operate any machinery for 24 hours (because of the anesthesia used during the test).  NUTRITION Drink plenty of fluids.  You may resume your normal diet as instructed by your doctor.  Begin with a light meal and progress to your normal diet. Heavy or fried foods are harder to digest and may make you feel sick to your stomach (nauseated).  Avoid alcoholic beverages for 24 hours or as instructed.  MEDICATIONS You may resume your normal medications unless your doctor tells you otherwise.  WHAT YOU CAN EXPECT TODAY Some feelings of bloating in the abdomen.  Passage of more gas than usual.  Spotting of blood in your stool or on the toilet paper.  IF YOU HAD POLYPS REMOVED DURING THE COLONOSCOPY: No aspirin products for 7 days or as instructed.  No alcohol for 7 days or as instructed.  Eat a soft diet for the next 24 hours.  FINDING OUT THE RESULTS OF YOUR TEST Not all test results are available during your visit. If your test results are not back during the visit, make an appointment with your caregiver to find out the results. Do not assume everything is normal if you have not heard from your caregiver or the medical facility. It is important for you to follow up on all of your test  results.  SEEK IMMEDIATE MEDICAL ATTENTION IF: You have more than a spotting of blood in your stool.  Your belly is swollen (abdominal distention).  You are nauseated or vomiting.  You have a temperature over 101.  You have abdominal pain or discomfort that is severe or gets worse throughout the day.     Your stomach and duodenum appeared inflamed.  Biopsies were taken.  Your colonoscopy appeared normal  Keep your upcoming appointment in her office  Further recommendations to follow pending review of pathology report  At patient request, I called Corky Molt at [phone]

## 2023-10-22 NOTE — Anesthesia Procedure Notes (Signed)
 Date/Time: 10/22/2023 7:35 AM  Performed by: Eliodoro Deward FALCON, CRNAPre-anesthesia Checklist: Patient identified, Emergency Drugs available, Suction available and Patient being monitored Patient Re-evaluated:Patient Re-evaluated prior to induction Oxygen Delivery Method: Nasal cannula Induction Type: IV induction Placement Confirmation: positive ETCO2 Comments: Optiflow High Flow [place] O2 used.

## 2023-10-22 NOTE — Op Note (Signed)
 [facility] Patient Name: Patricia Stanton Procedure Date: 10/22/2023 7:11 AM MRN: [id number] Date of Birth: 1986/06/21 Attending MD: Lamar Ozell Hollingshead , MD, [id number] CSN: [id number] Age: 38 Admit Type: Outpatient Procedure:                Colonoscopy Indications:              Iron  deficiency anemia Providers:                Lamar Ozell Hollingshead, MD, Devere Lodge, Jon Loge Referring MD:              Medicines:                Propofol  per Anesthesia Complications:            No immediate complications. Estimated Blood Loss:     Estimated blood loss: none. Procedure:                Pre-Anesthesia Assessment:                           - Prior to the procedure, a History and Physical                            was performed, and patient medications and                            allergies were reviewed. The patient's tolerance of                            previous anesthesia was also reviewed. The risks                            and benefits of the procedure and the sedation                            options and risks were discussed with the patient.                            All questions were answered, and informed consent                            was obtained. Prior Anticoagulants: The patient has                            taken no anticoagulant or antiplatelet agents. ASA                            Grade Assessment: III - A patient with severe                            systemic disease. After reviewing the risks and  benefits, the patient was deemed in satisfactory                            condition to undergo the procedure.                           After obtaining informed consent, the colonoscope                            was passed under direct vision. Throughout the                            procedure, the patient's blood pressure, pulse, and                            oxygen saturations were monitored  continuously. The                            [id number]) scope was introduced through the                            anus and advanced to the the cecum, identified by                            appendiceal orifice and ileocecal valve. The                            colonoscopy was performed without difficulty. The                            patient tolerated the procedure well. The quality                            of the bowel preparation was adequate. The                            ileocecal valve, appendiceal orifice, and rectum                            were photographed. Scope In: 8:00:18 AM Scope Out: 8:12:51 AM Scope Withdrawal Time: 0 hours 7 minutes 46 seconds  Total Procedure Duration: 0 hours 12 minutes 33 seconds  Findings:      The perianal and digital rectal examinations were normal.      The colon (entire examined portion) appeared normal.      The retroflexed view of the distal rectum and anal verge was normal and       showed no anal or rectal abnormalities. Impression:               - The entire examined colon is normal.                           - The distal rectum and anal verge are normal on  retroflexion view.                           - No specimens collected. Moderate Sedation:      Moderate (conscious) sedation was personally administered by an       anesthesia professional. The following parameters were monitored: oxygen       saturation, heart rate, blood pressure, respiratory rate, EKG, adequacy       of pulmonary ventilation, and response to care. Recommendation:           - Patient has a contact number available for                            emergencies. The signs and symptoms of potential                            delayed complications were discussed with the                            patient. Return to normal activities tomorrow.                            Written discharge instructions were provided to the                             patient.                           - Advance diet as tolerated.                           - Continue present medications.                           - Repeat colonoscopy in 10 years for screening                            purposes.                           - Return to GI office (date not yet determined).                            See EGD report. Procedure Code(s):        --- Professional ---                           [phone], Colonoscopy, flexible; diagnostic, including                            collection of specimen(s) by brushing or washing,                            when performed (separate procedure) Diagnosis Code(s):        --- Professional ---  D50.9, Iron  deficiency anemia, unspecified CPT copyright 2022 American Medical Association. All rights reserved. The codes documented in this report are preliminary and upon coder review may  be revised to meet current compliance requirements. Lamar HERO. Bell Carbo, MD Lamar Ozell Hollingshead, MD 10/22/2023 8:20:34 AM This report has been signed electronically. Number of Addenda: 0

## 2023-10-23 ENCOUNTER — Encounter (HOSPITAL_COMMUNITY): Payer: Self-pay | Admitting: Internal Medicine

## 2023-10-24 LAB — SURGICAL PATHOLOGY

## 2023-10-26 ENCOUNTER — Encounter: Payer: Self-pay | Admitting: Internal Medicine

## 2023-10-27 ENCOUNTER — Other Ambulatory Visit: Payer: Self-pay

## 2023-10-27 MED ORDER — BISMUTH/METRONIDAZ/TETRACYCLIN 140-125-125 MG PO CAPS: 3.0000 | ORAL_CAPSULE | Freq: Four times a day (QID) | ORAL | 0 refills | Status: DC

## 2023-10-28 ENCOUNTER — Encounter: Payer: Self-pay | Admitting: General Surgery

## 2023-10-28 ENCOUNTER — Ambulatory Visit (INDEPENDENT_AMBULATORY_CARE_PROVIDER_SITE_OTHER): Payer: Medicaid Other | Admitting: General Surgery

## 2023-10-28 VITALS — BP 162/85 | HR 91 | Temp 98.0°F | Resp 16 | Ht 65.0 in | Wt 271.0 lb

## 2023-10-28 DIAGNOSIS — N611 Abscess of the breast and nipple: Secondary | ICD-10-CM | POA: Diagnosis not present

## 2023-10-28 NOTE — Patient Instructions (Signed)
Continue packing area.  Call with issues.

## 2023-10-28 NOTE — Progress Notes (Signed)
[facility]  Doing well. No issues. BP (!) 162/85   Pulse 91   Temp 98 F (36.7 C) (Other (Comment))   Resp 16   Ht 5\' 5"  (1.651 m)   Wt 271 lb (122.9 kg)   SpO2 98%   BMI 45.10 kg/m  Left breast lateral wound healed Left breast medial wound packed, granulation, replaced  Patient s/p repeat I&D of breast abscess. Slowly healing. Encouraged smoking cessation  Continue packing area.  Call with issues.   Future Appointments  Date Time Provider Department Center  11/25/2023 11:15 AM Lucretia Roers, MD [facility] None  12/03/2023  9:30 AM Letta Median, PA-C [facility]   Algis Greenhouse, MD [facility] [address] [place] [place], [place] [address] [phone] (office)

## 2023-11-03 ENCOUNTER — Ambulatory Visit
Admission: RE | Admit: 2023-11-03 | Discharge: 2023-11-03 | Discharge status: Against Medical Advice | Payer: Medicaid Other | Source: Ambulatory Visit | Attending: Nurse Practitioner | Admitting: Nurse Practitioner

## 2023-11-03 NOTE — ED Notes (Signed)
Called pt twice on phone and once in waiting room. Pt answered phone call and states she will be right in. Have not seen pt come in urgent care.

## 2023-11-04 ENCOUNTER — Emergency Department (HOSPITAL_COMMUNITY): Payer: Medicaid Other

## 2023-11-04 ENCOUNTER — Observation Stay (HOSPITAL_COMMUNITY)
Admission: EM | Admit: 2023-11-04 | Discharge: 2023-11-04 | DRG: 871 | Discharge status: Against Medical Advice | Payer: Medicaid Other | Attending: Internal Medicine | Admitting: Internal Medicine

## 2023-11-04 ENCOUNTER — Other Ambulatory Visit: Payer: Self-pay

## 2023-11-04 ENCOUNTER — Encounter (HOSPITAL_COMMUNITY): Payer: Self-pay

## 2023-11-04 ENCOUNTER — Encounter: Payer: Self-pay | Admitting: Emergency Medicine

## 2023-11-04 ENCOUNTER — Ambulatory Visit
Admission: EM | Admit: 2023-11-04 | Discharge: 2023-11-04 | Disposition: A | Discharge status: Home / Self Care | Payer: Medicaid Other | Attending: Family Medicine | Admitting: Family Medicine

## 2023-11-04 DIAGNOSIS — J441 Chronic obstructive pulmonary disease with (acute) exacerbation: Secondary | ICD-10-CM | POA: Diagnosis present

## 2023-11-04 DIAGNOSIS — J440 Chronic obstructive pulmonary disease with acute lower respiratory infection: Secondary | ICD-10-CM | POA: Diagnosis present

## 2023-11-04 DIAGNOSIS — Z7984 Long term (current) use of oral hypoglycemic drugs: Secondary | ICD-10-CM | POA: Diagnosis not present

## 2023-11-04 DIAGNOSIS — B9681 Helicobacter pylori [H. pylori] as the cause of diseases classified elsewhere: Secondary | ICD-10-CM | POA: Diagnosis present

## 2023-11-04 DIAGNOSIS — F17210 Nicotine dependence, cigarettes, uncomplicated: Secondary | ICD-10-CM | POA: Diagnosis present

## 2023-11-04 DIAGNOSIS — E785 Hyperlipidemia, unspecified: Secondary | ICD-10-CM | POA: Diagnosis present

## 2023-11-04 DIAGNOSIS — E1165 Type 2 diabetes mellitus with hyperglycemia: Secondary | ICD-10-CM | POA: Diagnosis present

## 2023-11-04 DIAGNOSIS — Z8249 Family history of ischemic heart disease and other diseases of the circulatory system: Secondary | ICD-10-CM | POA: Diagnosis not present

## 2023-11-04 DIAGNOSIS — J189 Pneumonia, unspecified organism: Secondary | ICD-10-CM | POA: Diagnosis not present

## 2023-11-04 DIAGNOSIS — Z83438 Family history of other disorder of lipoprotein metabolism and other lipidemia: Secondary | ICD-10-CM

## 2023-11-04 DIAGNOSIS — Z886 Allergy status to analgesic agent status: Secondary | ICD-10-CM

## 2023-11-04 DIAGNOSIS — E872 Acidosis, unspecified: Secondary | ICD-10-CM | POA: Diagnosis not present

## 2023-11-04 DIAGNOSIS — A419 Sepsis, unspecified organism: Secondary | ICD-10-CM | POA: Diagnosis not present

## 2023-11-04 DIAGNOSIS — J9601 Acute respiratory failure with hypoxia: Secondary | ICD-10-CM

## 2023-11-04 DIAGNOSIS — R0902 Hypoxemia: Secondary | ICD-10-CM | POA: Diagnosis not present

## 2023-11-04 DIAGNOSIS — Z7951 Long term (current) use of inhaled steroids: Secondary | ICD-10-CM | POA: Diagnosis not present

## 2023-11-04 DIAGNOSIS — I10 Essential (primary) hypertension: Secondary | ICD-10-CM | POA: Diagnosis present

## 2023-11-04 DIAGNOSIS — A4189 Other specified sepsis: Principal | ICD-10-CM | POA: Diagnosis present

## 2023-11-04 DIAGNOSIS — E66813 Obesity, class 3: Secondary | ICD-10-CM | POA: Diagnosis present

## 2023-11-04 DIAGNOSIS — Z823 Family history of stroke: Secondary | ICD-10-CM

## 2023-11-04 DIAGNOSIS — Z833 Family history of diabetes mellitus: Secondary | ICD-10-CM | POA: Diagnosis not present

## 2023-11-04 DIAGNOSIS — J101 Influenza due to other identified influenza virus with other respiratory manifestations: Secondary | ICD-10-CM | POA: Diagnosis not present

## 2023-11-04 DIAGNOSIS — J4541 Moderate persistent asthma with (acute) exacerbation: Secondary | ICD-10-CM

## 2023-11-04 DIAGNOSIS — Z8261 Family history of arthritis: Secondary | ICD-10-CM

## 2023-11-04 DIAGNOSIS — Z885 Allergy status to narcotic agent status: Secondary | ICD-10-CM

## 2023-11-04 DIAGNOSIS — K219 Gastro-esophageal reflux disease without esophagitis: Secondary | ICD-10-CM | POA: Diagnosis not present

## 2023-11-04 DIAGNOSIS — Z841 Family history of disorders of kidney and ureter: Secondary | ICD-10-CM | POA: Diagnosis not present

## 2023-11-04 DIAGNOSIS — Z79899 Other long term (current) drug therapy: Secondary | ICD-10-CM | POA: Diagnosis not present

## 2023-11-04 DIAGNOSIS — Z6841 Body Mass Index (BMI) 40.0 and over, adult: Secondary | ICD-10-CM | POA: Diagnosis not present

## 2023-11-04 DIAGNOSIS — Z794 Long term (current) use of insulin: Secondary | ICD-10-CM | POA: Diagnosis not present

## 2023-11-04 DIAGNOSIS — E1122 Type 2 diabetes mellitus with diabetic chronic kidney disease: Secondary | ICD-10-CM | POA: Diagnosis present

## 2023-11-04 DIAGNOSIS — A048 Other specified bacterial intestinal infections: Secondary | ICD-10-CM | POA: Diagnosis not present

## 2023-11-04 DIAGNOSIS — J1000 Influenza due to other identified influenza virus with unspecified type of pneumonia: Secondary | ICD-10-CM | POA: Diagnosis present

## 2023-11-04 DIAGNOSIS — R918 Other nonspecific abnormal finding of lung field: Secondary | ICD-10-CM | POA: Diagnosis not present

## 2023-11-04 DIAGNOSIS — R6520 Severe sepsis without septic shock: Secondary | ICD-10-CM | POA: Diagnosis not present

## 2023-11-04 LAB — HEMOGLOBIN A1C
Hgb A1c MFr Bld: 9.9 % — ABNORMAL HIGH (ref 4.8–5.6)
Mean Plasma Glucose: 237.43 mg/dL

## 2023-11-04 LAB — CBC WITH DIFFERENTIAL/PLATELET
Abs Immature Granulocytes: 0.05 10*3/uL (ref 0.00–0.07)
Basophils Absolute: 0 10*3/uL (ref 0.0–0.1)
Basophils Relative: 0 %
Eosinophils Absolute: 0.1 10*3/uL (ref 0.0–0.5)
Eosinophils Relative: 1 %
HCT: 35.2 % — ABNORMAL LOW (ref 36.0–46.0)
Hemoglobin: 10 g/dL — ABNORMAL LOW (ref 12.0–15.0)
Immature Granulocytes: 0 %
Lymphocytes Relative: 17 %
Lymphs Abs: 2.2 10*3/uL (ref 0.7–4.0)
MCH: 18.3 pg — ABNORMAL LOW (ref 26.0–34.0)
MCHC: 28.4 g/dL — ABNORMAL LOW (ref 30.0–36.0)
MCV: 64.5 fL — ABNORMAL LOW (ref 80.0–100.0)
Monocytes Absolute: 1 10*3/uL (ref 0.1–1.0)
Monocytes Relative: 7 %
Neutro Abs: 9.6 10*3/uL — ABNORMAL HIGH (ref 1.7–7.7)
Neutrophils Relative %: 75 %
Platelets: 294 10*3/uL (ref 150–400)
RBC: 5.46 MIL/uL — ABNORMAL HIGH (ref 3.87–5.11)
RDW: 21.7 % — ABNORMAL HIGH (ref 11.5–15.5)
Smear Review: ADEQUATE
WBC: 12.9 10*3/uL — ABNORMAL HIGH (ref 4.0–10.5)
nRBC: 0 % (ref 0.0–0.2)

## 2023-11-04 LAB — CBC
HCT: 32 % — ABNORMAL LOW (ref 36.0–46.0)
Hemoglobin: 9.3 g/dL — ABNORMAL LOW (ref 12.0–15.0)
MCH: 18.7 pg — ABNORMAL LOW (ref 26.0–34.0)
MCHC: 29.1 g/dL — ABNORMAL LOW (ref 30.0–36.0)
MCV: 64.4 fL — ABNORMAL LOW (ref 80.0–100.0)
Platelets: 302 10*3/uL (ref 150–400)
RBC: 4.97 MIL/uL (ref 3.87–5.11)
RDW: 21.3 % — ABNORMAL HIGH (ref 11.5–15.5)
WBC: 13.8 10*3/uL — ABNORMAL HIGH (ref 4.0–10.5)
nRBC: 0 % (ref 0.0–0.2)

## 2023-11-04 LAB — BASIC METABOLIC PANEL
Anion gap: 12 (ref 5–15)
BUN: 8 mg/dL (ref 6–20)
CO2: 23 mmol/L (ref 22–32)
Calcium: 8.5 mg/dL — ABNORMAL LOW (ref 8.9–10.3)
Chloride: 99 mmol/L (ref 98–111)
Creatinine, Ser: 0.45 mg/dL (ref 0.44–1.00)
GFR, Estimated: >60 mL/min (ref >60)
Glucose, Bld: 230 mg/dL — ABNORMAL HIGH (ref 70–99)
Potassium: 3.3 mmol/L — ABNORMAL LOW (ref 3.5–5.1)
Sodium: 134 mmol/L — ABNORMAL LOW (ref 135–145)

## 2023-11-04 LAB — LACTIC ACID, PLASMA
Lactic Acid, Venous: 2.5 mmol/L (ref 0.5–1.9)
Lactic Acid, Venous: 2.7 mmol/L (ref 0.5–1.9)

## 2023-11-04 LAB — CBG MONITORING, ED: Glucose-Capillary: 248 mg/dL — ABNORMAL HIGH (ref 70–99)

## 2023-11-04 LAB — PHOSPHORUS: Phosphorus: 3.1 mg/dL (ref 2.5–4.6)

## 2023-11-04 LAB — CREATININE, SERUM
Creatinine, Ser: 0.42 mg/dL — ABNORMAL LOW (ref 0.44–1.00)
GFR, Estimated: >60 mL/min (ref >60)

## 2023-11-04 LAB — POC COVID19/FLU A&B COMBO
Covid Antigen, POC: NEGATIVE
Influenza A Antigen, POC: POSITIVE — AB
Influenza B Antigen, POC: NEGATIVE

## 2023-11-04 LAB — MAGNESIUM: Magnesium: 1.7 mg/dL (ref 1.7–2.4)

## 2023-11-04 LAB — PROCALCITONIN: Procalcitonin: <0.1 ng/mL

## 2023-11-04 LAB — HIV ANTIBODY (ROUTINE TESTING W REFLEX): HIV Screen 4th Generation wRfx: NONREACTIVE

## 2023-11-04 MED ORDER — ENOXAPARIN SODIUM 40 MG/0.4ML IJ SOSY: 40.0000 mg | PREFILLED_SYRINGE | INTRAMUSCULAR | Status: DC

## 2023-11-04 MED ORDER — IPRATROPIUM-ALBUTEROL 0.5-2.5 (3) MG/3ML IN SOLN
3.0000 mL | Freq: Once | RESPIRATORY_TRACT | Status: AC
  Administered 2023-11-04: 3 mL via RESPIRATORY_TRACT

## 2023-11-04 MED ORDER — SODIUM CHLORIDE 0.9 % IV SOLN
500.0000 mg | Freq: Once | INTRAVENOUS | Status: AC
  Administered 2023-11-04: 500 mg via INTRAVENOUS
  Filled 2023-11-04: qty 5

## 2023-11-04 MED ORDER — ARFORMOTEROL TARTRATE 15 MCG/2ML IN NEBU: 15.0000 ug | INHALATION_SOLUTION | Freq: Two times a day (BID) | RESPIRATORY_TRACT | Status: DC

## 2023-11-04 MED ORDER — ENOXAPARIN SODIUM 60 MG/0.6ML IJ SOSY: 60.0000 mg | PREFILLED_SYRINGE | INTRAMUSCULAR | Status: DC

## 2023-11-04 MED ORDER — IPRATROPIUM-ALBUTEROL 0.5-2.5 (3) MG/3ML IN SOLN: 3.0000 mL | Freq: Four times a day (QID) | RESPIRATORY_TRACT | Status: DC | PRN

## 2023-11-04 MED ORDER — LACTATED RINGERS IV BOLUS (SEPSIS)
800.0000 mL | Freq: Once | INTRAVENOUS | Status: AC
  Administered 2023-11-04: 800 mL via INTRAVENOUS

## 2023-11-04 MED ORDER — ONDANSETRON HCL 4 MG/2ML IJ SOLN: 4.0000 mg | Freq: Four times a day (QID) | INTRAMUSCULAR | Status: DC | PRN

## 2023-11-04 MED ORDER — SODIUM CHLORIDE 0.9 % IV SOLN: 2.0000 g | INTRAVENOUS | Status: DC

## 2023-11-04 MED ORDER — INSULIN ASPART 100 UNIT/ML IJ SOLN: 0.0000 [IU] | Freq: Every day | INTRAMUSCULAR | Status: DC

## 2023-11-04 MED ORDER — ALBUTEROL SULFATE (2.5 MG/3ML) 0.083% IN NEBU
5.0000 mg | INHALATION_SOLUTION | Freq: Once | RESPIRATORY_TRACT | Status: AC
  Administered 2023-11-04: 5 mg via RESPIRATORY_TRACT
  Filled 2023-11-04: qty 6

## 2023-11-04 MED ORDER — DOXYCYCLINE HYCLATE 100 MG PO TABS: 100.0000 mg | ORAL_TABLET | Freq: Two times a day (BID) | ORAL | Status: DC

## 2023-11-04 MED ORDER — INSULIN GLARGINE-YFGN 100 UNIT/ML ~~LOC~~ SOLN
15.0000 [IU] | Freq: Every day | SUBCUTANEOUS | Status: DC
Start: 2023-11-04 — End: 2023-11-04
  Filled 2023-11-04: qty 0.15

## 2023-11-04 MED ORDER — BUDESONIDE 0.5 MG/2ML IN SUSP: 0.5000 mg | Freq: Two times a day (BID) | RESPIRATORY_TRACT | Status: DC

## 2023-11-04 MED ORDER — ONDANSETRON HCL 4 MG PO TABS: 4.0000 mg | ORAL_TABLET | Freq: Four times a day (QID) | ORAL | Status: DC | PRN

## 2023-11-04 MED ORDER — PANTOPRAZOLE SODIUM 40 MG PO TBEC
40.0000 mg | DELAYED_RELEASE_TABLET | Freq: Two times a day (BID) | ORAL | Status: DC
  Administered 2023-11-04: 40 mg via ORAL
  Filled 2023-11-04: qty 1

## 2023-11-04 MED ORDER — INSULIN ASPART 100 UNIT/ML IJ SOLN: 0.0000 [IU] | Freq: Three times a day (TID) | INTRAMUSCULAR | Status: DC

## 2023-11-04 MED ORDER — SODIUM CHLORIDE 0.9 % IV SOLN
2.0000 g | Freq: Once | INTRAVENOUS | Status: AC
  Administered 2023-11-04: 2 g via INTRAVENOUS
  Filled 2023-11-04: qty 20

## 2023-11-04 MED ORDER — ALBUTEROL SULFATE HFA 108 (90 BASE) MCG/ACT IN AERS
2.0000 | INHALATION_SPRAY | Freq: Once | RESPIRATORY_TRACT | Status: AC
  Administered 2023-11-04: 2 via RESPIRATORY_TRACT

## 2023-11-04 MED ORDER — ACETAMINOPHEN 650 MG RE SUPP: 650.0000 mg | Freq: Four times a day (QID) | RECTAL | Status: DC | PRN

## 2023-11-04 MED ORDER — METHYLPREDNISOLONE SODIUM SUCC 40 MG IJ SOLR
40.0000 mg | Freq: Two times a day (BID) | INTRAMUSCULAR | Status: DC
  Administered 2023-11-04: 40 mg via INTRAVENOUS
  Filled 2023-11-04: qty 1

## 2023-11-04 MED ORDER — DM-GUAIFENESIN ER 30-600 MG PO TB12
1.0000 | ORAL_TABLET | Freq: Two times a day (BID) | ORAL | Status: DC
  Filled 2023-11-04: qty 1

## 2023-11-04 MED ORDER — ACETAMINOPHEN 325 MG PO TABS: 650.0000 mg | ORAL_TABLET | Freq: Four times a day (QID) | ORAL | Status: DC | PRN

## 2023-11-04 MED ORDER — LACTATED RINGERS IV SOLN: INTRAVENOUS | Status: DC

## 2023-11-04 MED ORDER — LACTATED RINGERS IV BOLUS (SEPSIS)
1000.0000 mL | Freq: Once | INTRAVENOUS | Status: AC
  Administered 2023-11-04: 1000 mL via INTRAVENOUS

## 2023-11-04 NOTE — ED Notes (Signed)
 Pt in xray

## 2023-11-04 NOTE — H&P (Signed)
History and Physical    Patient: Patricia Stanton ZOX:[id number] DOB: 1986/05/05 DOA: 11/04/2023 DOS: the patient was seen and examined on 11/04/2023 PCP: Carmel Sacramento, NP  Patient coming from: Home  Chief Complaint:  Chief Complaint  Patient presents with   Influenza   HPI: Patricia Stanton is a 38 y.o. female with medical history significant of depression/anxiety, hyperlipidemia, hypertension, type 2 diabetes mellitus with nephropathy, class III obesity, history of asthma and tobacco abuse; who presented to the hospital secondary to shortness of breath, chills, general malaise and productive coughing spells.  Patient symptoms have been present for the last 3-4 days and worsening.  She reported exposure to sick family members.   On the day of admission she went to urgent care and was found to be positive for influenza A, having tachycardia, hypoxia, tachypnea and diffuse expiratory wheezing and rhonchi.  Patient was instructed to presented to the hospital for further evaluation and management.  In the ED workup demonstrating patient meeting criteria for sepsis with elevated WBCs, increased respiratory rate, tachycardia, hypoxia in the 87-88 range on room air (requiring 2 L nasal cannula supplementation to maintain saturation) and a positive chest x-ray suggesting the presence of multifocal pneumonia. Patient's lactic acid 2.7.  Fluid resuscitation per sepsis protocol initiated, patient's blood culture taken and IV antibiotics started.  Due to ongoing tachypnea and diffuse expiratory wheezing nebulizer management were provided along with steroids therapy.  TRH consulted to place patient in the hospital for further evaluation and management.  Review of Systems: As mentioned in the history of present illness. All other systems reviewed and are negative. 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 (HCC) 03/25/2019   Mood disorder (HCC)    Obesity    Ovarian cyst    Tobacco abuse    Past Surgical History:  Procedure Laterality Date   BIOPSY  10/22/2023   Procedure: BIOPSY;  Surgeon: Corbin Ade, MD;  [facility];  Service: Endoscopy;;   BREAST BIOPSY Left 06/17/2023   [place] LT BREAST BX W LOC DEV 1ST LESION IMG BX SPEC US GUIDE 06/17/2023 [facility]   BREAST BIOPSY Left 06/17/2023   [place] LT BREAST BX W LOC DEV EA ADD LESION IMG BX SPEC US GUIDE 06/17/2023 [facility]   BREAST BIOPSY Left 06/17/2023   [place] LT BREAST BX W LOC DEV EA ADD LESION IMG BX SPEC US GUIDE 06/17/2023 [facility]   BREAST BIOPSY Left 07/11/2023   Procedure: LEFT BREAST BIOPSY;  Surgeon: Lucretia Roers, MD;  Location: [facility];  Service: General;  Laterality: Left;   CESAREAN SECTION  2008   [facility]   CHOLECYSTECTOMY N/A 11/25/2014   Procedure: LAPAROSCOPIC CHOLECYSTECTOMY;  Surgeon: Franky Macho Md, MD;  Location: [facility];  Service: General;  Laterality: N/A;   COLONOSCOPY WITH PROPOFOL N/A 10/22/2023   Procedure: COLONOSCOPY WITH PROPOFOL;  Surgeon: Corbin Ade, MD;  [facility];  Service: Endoscopy;  Laterality: N/A;  900am, asa 3   ESOPHAGOGASTRODUODENOSCOPY N/A 12/21/2015   Dr. Jena Gauss: normal esophagus, non-bleeding  erosive gastropathy, normal second portion of duodenum. Reactive gastropathy/chemical gastritis, negative H.pylori   ESOPHAGOGASTRODUODENOSCOPY (EGD) WITH PROPOFOL N/A 11/23/2020    Surgeon: Corbin Ade, MD; normal esophagus, small hiatal hernia, normal examined duodenum.   ESOPHAGOGASTRODUODENOSCOPY (EGD) WITH PROPOFOL N/A 10/22/2023   Procedure: ESOPHAGOGASTRODUODENOSCOPY (EGD) WITH PROPOFOL;   Surgeon: Corbin Ade, MD;  [facility];  Service: Endoscopy;  Laterality: N/A;   FEMUR FRACTURE SURGERY Left 1995   tree fell on her   HERNIA REPAIR     INCISION AND DRAINAGE ABSCESS Left 07/11/2023   Procedure: INCISION AND DRAINAGE ABSCESS, BREAST;  Surgeon: Lucretia Roers, MD;  Location: [facility];  Service: General;  Laterality: Left;   INCISIONAL HERNIA REPAIR N/A 01/29/2016   Procedure: Sherald Hess HERNIORRHAPHY WITH MESH;  Surgeon: Franky Macho, MD;  Location: [facility];  Service: General;  Laterality: N/A;   INSERTION OF MESH  01/29/2016   Procedure: INSERTION OF MESH;  Surgeon: Franky Macho, MD;  Location: [facility];  Service: General;;   KNEE ARTHROSCOPY Left    LUMBAR LAMINECTOMY/DECOMPRESSION MICRODISCECTOMY Right 05/21/2019   Procedure: Microdiscectomy - Lumbar four-Lumbar five - right;  Surgeon: Tia Alert, MD;  Location[facility];  Service: Neurosurgery;  Laterality: Right;   TUBAL LIGATION     Social History:  reports that she has been smoking cigarettes. She started smoking about 20 years ago. She has a 10.1 pack-year smoking history. She has never been exposed to tobacco smoke. She has never used smokeless tobacco. She reports that she does not drink alcohol and does not use drugs.  Allergies  Allergen Reactions   Fentanyl Other (See Comments)    Patient does not wish to ever receive fentanyl again.  She stated this makes her extremely irritable to others around her for approximately 24 hours.     Tramadol Other (See Comments)    Exacerbates her asthma    Nsaids     Family History  Problem Relation Age of Onset   Hypertension Mother    Hyperlipidemia Mother    Fibromyalgia Mother    Other Mother        degenerative disc disease   Arthritis Mother    Kidney disease Mother    Diabetes Father    Hypertension Father    Hyperlipidemia Father    Heart disease Father 84   Heart attack Father    Stroke Father    Hyperlipidemia Brother    Hypertension  Brother    Aneurysm Maternal Grandmother        AAA   Kidney disease Maternal Grandmother    Kidney disease Maternal Grandfather    Aneurysm Paternal Grandmother        AAA   Kidney disease Paternal Grandmother    Kidney disease Paternal Grandfather    Colon cancer Neg Hx    Inflammatory bowel disease Neg Hx     Prior to Admission medications   Medication Sig Start Date End Date Taking? Authorizing Provider  albuterol (PROVENTIL) (2.5 MG/3ML) 0.083% nebulizer solution Take 3 mLs (2.5 mg total) by nebulization every 6 (six) hours as needed for wheezing or shortness of breath. 06/25/21   Junie Spencer, FNP  albuterol (VENTOLIN HFA) 108 (90 Base) MCG/ACT inhaler Inhale 2 puffs into the lungs every 6 (six) hours as needed for wheezing or shortness of breath. 07/17/21   Daryll Drown, NP  Bismuth/Metronidaz/Tetracyclin (PYLERA) [phone] MG CAPS Take 3 capsules by mouth 4 (four) times daily for 10 days. Patient not  taking: Reported on 10/28/2023 10/27/23 11/06/23  Corbin Ade, MD  budesonide-formoterol [facility]) 80-4.5 MCG/ACT inhaler Inhale 2 puffs into the lungs 2 (two) times daily. Patient taking differently: Inhale 2 puffs into the lungs 2 (two) times daily as needed (wheezing). 01/03/23   Raspet, Noberto Retort, PA-C  ferrous sulfate (SLOW RELEASE IRON) 160 (50 Fe) MG TBCR SR tablet Take 1 tablet (160 mg total) by mouth daily. 09/17/23   Letta Median, PA-C  fluconazole (DIFLUCAN) 150 MG tablet Take 1 tablet (150 mg total) by mouth daily. Take one tablet for vaginal itching related to antibiotic. May repeat in 3 days if symptoms not resolved. Hold your statin on the days you take this medication. 09/09/23   Lucretia Roers, MD  gabapentin (NEURONTIN) 600 MG tablet Take 600 mg by mouth 3 (three) times daily. 11/05/20   [provider]  linaclotide Karlene Einstein) 145 MCG CAPS capsule Take 1 capsule (145 mcg total) by mouth daily before breakfast. 09/01/23   Letta Median, PA-C   metFORMIN (GLUCOPHAGE) 500 MG tablet Take by mouth 2 (two) times daily with a meal.    [provider]  methocarbamol (ROBAXIN) 500 MG tablet Take 1 tablet (500 mg total) by mouth 3 (three) times daily as needed for muscle spasms. Patient taking differently: Take 500 mg by mouth in the morning and at bedtime. 06/28/21   Jannifer Rodney A, FNP  ondansetron (ZOFRAN-ODT) 4 MG disintegrating tablet Take 1 tablet (4 mg total) by mouth every 8 (eight) hours as needed for nausea or vomiting. 12/17/22   Valentino Nose, NP  oxyCODONE-acetaminophen (PERCOCET/ROXICET) 5-325 MG tablet Take 1 tablet by mouth every 4 (four) hours as needed for moderate pain. Patient taking differently: Take 1 tablet by mouth in the morning, at noon, in the evening, and at bedtime. 05/21/19   Arman Bogus, MD  pantoprazole (PROTONIX) 40 MG tablet Take 1 tablet (40 mg total) by mouth daily before breakfast. 09/01/23   Letta Median, PA-C    Physical Exam: Vitals:   11/04/23 1047 11/04/23 1130 11/04/23 1145 11/04/23 1404  BP:  134/68 (!) 149/70   Pulse:  (!) 104 (!) 106   Resp:  (!) 25 (!) 29   Temp:    98.9 F (37.2 C)  TempSrc:    Oral  SpO2: 94% 94% 96%   Weight:      Height:       General exam: Alert, awake, oriented x 3, mildly anxious and demonstrating difficulty to speak in full sentences. Respiratory system: Fair air movement bilaterally; positive tachypnea, positive rhonchi and expiratory wheezing. Cardiovascular system:RRR. No murmurs, rubs, gallops. Gastrointestinal system: Abdomen is obese, nondistended, soft and nontender. No organomegaly or masses felt. Normal bowel sounds heard. Central nervous system: No focal neurological deficits. Extremities: No cyanosis or clubbing; trace edema appreciated bilaterally (unchanged from baseline). Skin: No petechiae.  Patient with ongoing left breast wound for which she is actively receiving care/packing and follow-up by general surgery. Psychiatry:  Judgement and insight appear normal.  No suicidal ideation or hallucinations.  Data Reviewed: Lactic acid: 2.7 Basic metabolic panel: Sodium 134, potassium 3.3, chloride 99, bicarb 23, glucose 230, BUN 8, creatinine 0.45 and GFR >60 ZOX:WRUEA blood cells 12.9, hemoglobin 10.0 platelet count 294K Influenza A antigen POC positive. Chest x-ray demonstrating diffuse bilateral airspace opacity concerning for multifocal pneumonia/atypical infection.   Assessment and Plan: 1-severe sepsis secondary to community-acquired pneumonia -Cultures taken -Sepsis protocol initiated -Continue aggressive fluid resuscitation and IV  antibiotics -Bronchodilator management, steroids, mucolytic's and oxygen supplementation provided -Patient was out of the therapeutic window for Tamiflu. -Antipyretics and supportive care will be provided -Checking procalcitonin level. -Rocephin and doxycycline started.  2-acute respiratory failure with hypoxia -In the setting of community-acquired pneumonia/COPD exacerbation -Treatment as mentioned above.  3-lactic acidosis -Secondary to sepsis -Continue fluid resuscitation and follow lactic acid level.  4-type 2 diabetes with hyperglycemia -Holding oral hypoglycemic agents while inpatient -Will update A1c -Sliding scale insulin and long-acting insulin provided -Modified [place] diet discussed with patient.  5-gastroesophageal reflux disease -Continue PPI.  6-H. pylori infection -Planning to resume PPI and once acute PNA process completed complete therapy with Pylera. -continue outpatient follow up with GI  7-tobacco abuse -Cessation counseling provided -Nicotine patch declined.  8-left breast wound -General Surgery evaluation requested by patient -Continue local wound care -Receiving systemic antibiotics as part of treatment for pneumonia.    Advance Care Planning:   Code Status: Full Code   Consults: general surgery  Family Communication: husband at  bedside  Severity of Illness: The appropriate patient status for this patient is INPATIENT. Inpatient status is judged to be reasonable and necessary in order to provide the required intensity of service to ensure the patient's safety. The patient's presenting symptoms, physical exam findings, and initial radiographic and laboratory data in the context of their chronic comorbidities is felt to place them at high risk for further clinical deterioration. Furthermore, it is not anticipated that the patient will be medically stable for discharge from the hospital within 2 midnights of admission.   * I certify that at the point of admission it is my clinical judgment that the patient will require inpatient hospital care spanning beyond 2 midnights from the point of admission due to high intensity of service, high risk for further deterioration and high frequency of surveillance required.*  Author: Vassie Loll, MD 11/04/2023 2:07 PM  For on call review [email or web address].

## 2023-11-04 NOTE — ED Provider Notes (Signed)
[place] EMERGENCY DEPARTMENT AT [facility] Provider Note   CSN: [id number] Arrival date & time: 11/04/23  4098     History  Chief Complaint  Patient presents with   Influenza    Patricia Stanton is a 38 y.o. female.   Influenza Presenting symptoms: cough, diarrhea, myalgias and shortness of breath   Presenting symptoms: no fever, no headaches, no nausea, no sore throat and no vomiting   Associated symptoms: nasal congestion   Associated symptoms: no chills        Patricia Stanton is a 38 y.o. female past medical history of type 2 diabetes, asthma, hypertension, GERD, who presents to the Emergency Department from local urgent care via POV.  Patient was seen at urgent care earlier today diagnosed with flu A.  She was recommended to come to the emergency department for further evaluation for hypoxia.  Patient was noted to have sats in the low 80s.  She was given albuterol neb treatment with improvement of her O2 sat, but became hypoxic again.  Patient describes having cough, generalized bodyaches, chest pains nasal congestion with diarrhea.  Symptoms began over the weekend gradually worsening.  Other family members with similar symptoms.  Currently, she describes having diffuse pain of her chest and ribs associated with deep breathing and coughing states she has been coughing forcefully for several days and that her chest feels "sore."  She denies significant fever, vomiting, abdominal pain. Did not receive a flu vaccine this season.  Home Medications Prior to Admission medications   Medication Sig Start Date End Date Taking? Authorizing Provider  albuterol (PROVENTIL) (2.5 MG/3ML) 0.083% nebulizer solution Take 3 mLs (2.5 mg total) by nebulization every 6 (six) hours as needed for wheezing or shortness of breath. 06/25/21   Junie Spencer, FNP  albuterol (VENTOLIN HFA) 108 (90 Base) MCG/ACT inhaler Inhale 2 puffs into the lungs every 6 (six) hours as needed for  wheezing or shortness of breath. 07/17/21   Daryll Drown, NP  Bismuth/Metronidaz/Tetracyclin (PYLERA) [phone] MG CAPS Take 3 capsules by mouth 4 (four) times daily for 10 days. Patient not taking: Reported on 10/28/2023 10/27/23 11/06/23  Corbin Ade, MD  budesonide-formoterol [facility]) 80-4.5 MCG/ACT inhaler Inhale 2 puffs into the lungs 2 (two) times daily. Patient taking differently: Inhale 2 puffs into the lungs 2 (two) times daily as needed (wheezing). 01/03/23   Raspet, Noberto Retort, PA-C  ferrous sulfate (SLOW RELEASE IRON) 160 (50 Fe) MG TBCR SR tablet Take 1 tablet (160 mg total) by mouth daily. 09/17/23   Letta Median, PA-C  fluconazole (DIFLUCAN) 150 MG tablet Take 1 tablet (150 mg total) by mouth daily. Take one tablet for vaginal itching related to antibiotic. May repeat in 3 days if symptoms not resolved. Hold your statin on the days you take this medication. 09/09/23   Lucretia Roers, MD  gabapentin (NEURONTIN) 600 MG tablet Take 600 mg by mouth 3 (three) times daily. 11/05/20   [provider]  linaclotide Karlene Einstein) 145 MCG CAPS capsule Take 1 capsule (145 mcg total) by mouth daily before breakfast. 09/01/23   Letta Median, PA-C  metFORMIN (GLUCOPHAGE) 500 MG tablet Take by mouth 2 (two) times daily with a meal.    [provider]  methocarbamol (ROBAXIN) 500 MG tablet Take 1 tablet (500 mg total) by mouth 3 (three) times daily as needed for muscle spasms. Patient taking differently: Take 500 mg by mouth in the morning and at bedtime. 06/28/21  Hawks, Christy A, FNP  ondansetron (ZOFRAN-ODT) 4 MG disintegrating tablet Take 1 tablet (4 mg total) by mouth every 8 (eight) hours as needed for nausea or vomiting. 12/17/22   Valentino Nose, NP  oxyCODONE-acetaminophen (PERCOCET/ROXICET) 5-325 MG tablet Take 1 tablet by mouth every 4 (four) hours as needed for moderate pain. Patient taking differently: Take 1 tablet by mouth in the morning, at noon, in the  evening, and at bedtime. 05/21/19   Arman Bogus, MD  pantoprazole (PROTONIX) 40 MG tablet Take 1 tablet (40 mg total) by mouth daily before breakfast. 09/01/23   Letta Median, PA-C      Allergies    Fentanyl, Tramadol, and Nsaids    Review of Systems   Review of Systems  Constitutional:  Negative for appetite change, chills and fever.  HENT:  Positive for congestion. Negative for sore throat and trouble swallowing.   Respiratory:  Positive for cough, shortness of breath and wheezing.   Cardiovascular:  Positive for chest pain. Negative for leg swelling.  Gastrointestinal:  Positive for diarrhea. Negative for abdominal pain, nausea and vomiting.  Genitourinary:  Negative for difficulty urinating.  Musculoskeletal:  Positive for myalgias.  Neurological:  Negative for dizziness, weakness, numbness and headaches.  Psychiatric/Behavioral:  Negative for confusion.     Physical Exam Updated Vital Signs BP (!) 150/74 (BP Location: Right Arm)   Pulse 96   Temp 99.4 F (37.4 C) (Oral)   Resp (!) 22   Ht 5\' 5"  (1.651 m)   Wt 123 kg   LMP 09/30/2023   SpO2 97%   BMI 45.12 kg/m  Physical Exam Vitals and nursing note reviewed.  Constitutional:      General: She is not in acute distress.    Appearance: Normal appearance. She is not ill-appearing.  HENT:     Right Ear: Tympanic membrane and ear canal normal.     Left Ear: Tympanic membrane and ear canal normal.  Eyes:     Conjunctiva/sclera: Conjunctivae normal.     Pupils: Pupils are equal, round, and reactive to light.  Cardiovascular:     Rate and Rhythm: Normal rate and regular rhythm.     Pulses: Normal pulses.  Pulmonary:     Effort: Tachypnea present. No respiratory distress.     Breath sounds: Rales present.     Comments: Patient on 2 L O2 by nasal cannula.  Crackles of the right upper and midlung area.  Slightly diminished lung sounds as well.  No increased work of breathing on exam Abdominal:     Palpations:  Abdomen is soft.     Tenderness: There is no abdominal tenderness.  Musculoskeletal:        General: Normal range of motion.  Lymphadenopathy:     Cervical: No cervical adenopathy.  Skin:    General: Skin is warm.     Capillary Refill: Capillary refill takes less than 2 seconds.  Neurological:     General: No focal deficit present.     Mental Status: She is alert.     Sensory: No sensory deficit.     Motor: No weakness.     ED Results / Procedures / Treatments   Labs (all labs ordered are listed, but only abnormal results are displayed) Labs Reviewed  CBC WITH DIFFERENTIAL/PLATELET - Abnormal; Notable for the following components:      Result Value   WBC 12.9 (*)    RBC 5.46 (*)    Hemoglobin 10.0 (*)  HCT 35.2 (*)    MCV 64.5 (*)    MCH 18.3 (*)    MCHC 28.4 (*)    RDW 21.7 (*)    Neutro Abs 9.6 (*)    All other components within normal limits  BASIC METABOLIC PANEL - Abnormal; Notable for the following components:   Sodium 134 (*)    Potassium 3.3 (*)    Glucose, Bld 230 (*)    Calcium 8.5 (*)    All other components within normal limits  LACTIC ACID, PLASMA - Abnormal; Notable for the following components:   Lactic Acid, Venous 2.5 (*)    All other components within normal limits  LACTIC ACID, PLASMA - Abnormal; Notable for the following components:   Lactic Acid, Venous 2.7 (*)    All other components within normal limits  CBC - Abnormal; Notable for the following components:   WBC 13.8 (*)    Hemoglobin 9.3 (*)    HCT 32.0 (*)    MCV 64.4 (*)    MCH 18.7 (*)    MCHC 29.1 (*)    RDW 21.3 (*)    All other components within normal limits  CULTURE, BLOOD (ROUTINE X 2)  CULTURE, BLOOD (ROUTINE X 2)  EXPECTORATED SPUTUM ASSESSMENT W GRAM STAIN, RFLX TO RESP C  HIV ANTIBODY (ROUTINE TESTING W REFLEX)  LEGIONELLA PNEUMOPHILA SEROGP 1 UR AG  STREP PNEUMONIAE URINARY ANTIGEN  CREATININE, SERUM  MAGNESIUM  PHOSPHORUS  HEMOGLOBIN A1C  PROCALCITONIN     EKG None  Radiology DG Chest 2 View Result Date: 11/04/2023 CLINICAL DATA:  Hypoxia EXAM: CHEST - 2 VIEW COMPARISON:  Chest radiograph 07/13/2021 FINDINGS: Stable cardiac and mediastinal contours. Interval development of diffuse bilateral airspace opacities. No pleural effusion or pneumothorax. Thoracic spine degenerative changes. IMPRESSION: Interval development of diffuse bilateral airspace opacities concerning for multifocal pneumonia or atypical infection. Electronically Signed   By: Annia Belt M.D.   On: 11/04/2023 12:22    Procedures Procedures     CRITICAL CARE Performed by: Ara Grandmaison Total critical care time: 40 minutes Critical care time was exclusive of separately billable procedures and treating other patients. Critical care was necessary to treat or prevent imminent or life-threatening deterioration. Critical care was time spent personally by me on the following activities: development of treatment plan with patient and/or surrogate as well as nursing, discussions with consultants, evaluation of patient's response to treatment, examination of patient, obtaining history from patient or surrogate, ordering and performing treatments and interventions, ordering and review of laboratory studies, ordering and review of radiographic studies, pulse oximetry and re-evaluation of patient's condition.  Patient was hypoxic on arrival required multiple therapies including oxygen, antibiotics, IV fluids, labs, x-ray, albuterol nebulizer treatment, close monitoring  Medications Ordered in ED Medications  albuterol (PROVENTIL) (2.5 MG/3ML) 0.083% nebulizer solution 5 mg (has no administration in time range)    ED Course/ Medical Decision Making/ A&P                                 Medical Decision Making Patient seen earlier today at urgent care recommended to receive further care in the emergency department due to her hypoxic state at urgent care.  She endorses flulike  symptoms for several days, notes excessive coughing and soreness of her bilateral ribs and chest area.  Has history of asthma  On review of medical records, patient was noted to be hypoxic in the upper 80s  at urgent care, she received albuterol neb with some temporary improvement but desatted into the 80s again shortly after  Differential would include but not limited to hypoxia due to pneumonia, PE, ACS, viral process, bronchitis.  Does not have a supplemental oxygen requirement at baseline.  She was noted to be hypoxic on arrival we will repeat albuterol neb, labs, chest x-ray, I have high clinical suspicion for pneumonia.  She will likely require hospital admission.  On arrival here, patient tachypneic currently on 2 L O2 by nasal cannula O2 sats in the mid to upper 90s, no respiratory distress noted but crackles in the right lung area are appreciated on my exam.  Differential includes but not limited to pneumonia, bronchitis, viral process, PE and ACS also considered.  Given her exam findings, I suspect this is pneumonia.  Amount and/or Complexity of Data Reviewed Labs: ordered.    Details: Labs show mild cytosis, chemistries blood sugar elevated.  Has history of diabetes.  Anion gap unremarkable.  No acidosis.  Initial lactic acid elevated at 2.5. Radiology: ordered.    Details: Chest x-ray shows interval development of bilateral airspace opacities concerning for multifocal pneumonia ECG/medicine tests: ordered.    Details: EKG shows sinus tachycardia Discussion of management or test interpretation with external provider(s):   Medical records reviewed, had positive flu A test at urgent care earlier today.  Mildly hypoxic on arrival, initial lactic acid was elevated, evolving sepsis order set initiated.  IV fluids and antibiotics given.  Cultures are pending.  Chest x-ray shows a multifocal pneumonia.  Patient will need hospital admission.  Patient initially given IV Zithromax.  I was  informed by nursing that immediately upon starting the Zithromax the patient began complaining of feeling "tightness" to her throat and difficulty breathing.  The Zithromax was immediately discontinued and patient's symptoms immediately resolved.  I ordered nursing staff to DC the Zithromax and only give Rocephin.  Discussed findings with [facility] hospitalist, Dr. Gwenlyn Perking who agrees to admit.  Risk Prescription drug management. Decision regarding hospitalization.           Final Clinical Impression(s) / ED Diagnoses Final diagnoses:  Multifocal pneumonia  Hypoxia    Rx / DC Orders ED Discharge Orders     None         Pauline Aus, PA-C 11/04/23 1537    Gloris Manchester, MD 11/04/23 1546

## 2023-11-04 NOTE — ED Triage Notes (Signed)
Pt arrived via POV c/o hypoxia. Pt reports being sent to the ER from Urgent Care due to her O2 Sats being low. Pt reports testing Flu+ today. Pt reports receiving a breathing treatment PTA.

## 2023-11-04 NOTE — ED Triage Notes (Addendum)
Pt reports generalized body aches and hurts to breathe x4 days. Has tried vics nyquil last night and reports woke up this morning with "break out around mouth."  Reports is out of asthma meds.

## 2023-11-04 NOTE — ED Notes (Signed)
Patient has been going back and forth all day on whether or not she wanted to stay or not, patient ended up leaving against medical advice. Patient was made aware of risks and patient stated several times that if she was not feeling better she would immediately return to the emergency department. Patient spoke with Dr Gwenlyn Perking before leaving AMA, and was advised against leaving, and it was discussed several times that it was in the best interest of the patient that she stay. The patient stated she understood the risks and would she her PCP tomorrow at 0845.

## 2023-11-04 NOTE — ED Notes (Signed)
Patient is being discharged from the Urgent Care and sent to the Emergency Department via POV . Per Roosvelt Maser, PA-C, patient is in need of higher level of care due to hypoxia. Patient is aware and verbalizes understanding of plan of care.  Vitals:   11/04/23 0845  BP: 139/79  Pulse: 99  Resp: (!) 22  Temp: 99.7 F (37.6 C)  SpO2: (!) 87%

## 2023-11-04 NOTE — ED Notes (Signed)
Patient left before she could sign AMA form.

## 2023-11-04 NOTE — ED Notes (Addendum)
Pt refused EMS. Pt alert and oriented at departure from UC. Spouse driving pt to ED.

## 2023-11-04 NOTE — ED Provider Notes (Signed)
[facility]    CSN: [id number] Arrival date & time: 11/04/23  0809      History   Chief Complaint Chief Complaint  Patient presents with   Generalized Body Aches    HPI Patricia Stanton is a 38 y.o. female.   Patient presenting today with 4-day history of body aches, fever, chest tightness, wheezing, significant shortness of breath, congestion.  Denies dizziness, lethargy, vomiting, diarrhea.  Has been trying NyQuil with minimal relief.  History of significant asthma and states she has been out of her asthma medications.  Was previously on Symbicort and albuterol as needed.  Significant other tested positive for influenza.    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) 03/25/2019   Mood disorder (HCC)    Obesity    Ovarian cyst    Tobacco abuse     Patient Active Problem List   Diagnosis Date Noted   Left breast abscess 07/08/2023   Abnormal iron saturation 08/06/2021   Chronic kidney disease, stage 3a (HCC) 07/31/2021   Upper respiratory infection with cough and congestion 07/17/2021   Elevated ALT measurement 01/04/2021   Gastroesophageal reflux disease 11/09/2020   Closed fracture of sesamoid bone of hand 12/01/2019   Carpal tunnel syndrome 11/30/2019   History of lumbar laminectomy 05/21/2019   Simple chronic bronchitis (HCC) 03/25/2019   Hypertension in chronic kidney disease due to type 2 diabetes mellitus (HCC) 03/25/2019   Leukocytosis 11/19/2018   Noncompliance 06/03/2017   Hyperlipidemia associated with type 2 diabetes mellitus (HCC) 02/17/2017   Dyslipidemia due to type 2  diabetes mellitus (HCC) 02/17/2017   Recurrent major depressive episodes, moderate (HCC) 02/03/2017   Vitamin D deficiency 11/20/2016   Type 2 diabetes mellitus with obesity (HCC) 11/20/2016   Posttraumatic stress disorder 11/19/2016   History of hernia repair 11/19/2016   Pulmonary nodule/lesion, solitary 11/19/2016   Mucosal abnormality of stomach    Non-intractable vomiting 12/19/2015   Constipation 12/19/2015   Epigastric pain 12/19/2015   Tobacco user 06/06/2013   Morbid obesity (HCC) 06/06/2013    Past Surgical History:  Procedure Laterality Date   BIOPSY  10/22/2023   Procedure: BIOPSY;  Surgeon: Corbin Ade, MD;  [facility];  Service: Endoscopy;;   BREAST BIOPSY Left 06/17/2023   [place] LT BREAST BX W LOC DEV 1ST LESION IMG BX SPEC US GUIDE 06/17/2023 [facility]   BREAST BIOPSY Left 06/17/2023   [place] LT BREAST BX W LOC DEV EA ADD LESION IMG BX SPEC US GUIDE 06/17/2023 [facility]   BREAST BIOPSY Left 06/17/2023   [place] LT BREAST BX W LOC DEV EA ADD LESION IMG BX SPEC US GUIDE 06/17/2023 [facility]   BREAST BIOPSY Left 07/11/2023   Procedure: LEFT BREAST BIOPSY;  Surgeon: Lucretia Roers, MD;  Location: [facility];  Service: General;  Laterality: Left;   CESAREAN SECTION  2008   [facility]   CHOLECYSTECTOMY N/A 11/25/2014   Procedure: LAPAROSCOPIC CHOLECYSTECTOMY;  Surgeon: Franky Macho Md, MD;  Location: [facility];  Service: General;  Laterality: N/A;  COLONOSCOPY WITH PROPOFOL N/A 10/22/2023   Procedure: COLONOSCOPY WITH PROPOFOL;  Surgeon: Corbin Ade, MD;  [facility];  Service: Endoscopy;  Laterality: N/A;  900am, asa 3   ESOPHAGOGASTRODUODENOSCOPY N/A 12/21/2015   Dr. Jena Gauss: normal esophagus, non-bleeding erosive gastropathy, normal second portion of duodenum. Reactive gastropathy/chemical gastritis, negative H.pylori   ESOPHAGOGASTRODUODENOSCOPY (EGD) WITH PROPOFOL N/A 11/23/2020    Surgeon: Corbin Ade, MD; normal esophagus, small hiatal hernia,  normal examined duodenum.   ESOPHAGOGASTRODUODENOSCOPY (EGD) WITH PROPOFOL N/A 10/22/2023   Procedure: ESOPHAGOGASTRODUODENOSCOPY (EGD) WITH PROPOFOL;  Surgeon: Corbin Ade, MD;  [facility];  Service: Endoscopy;  Laterality: N/A;   FEMUR FRACTURE SURGERY Left 1995   tree fell on her   HERNIA REPAIR     INCISION AND DRAINAGE ABSCESS Left 07/11/2023   Procedure: INCISION AND DRAINAGE ABSCESS, BREAST;  Surgeon: Lucretia Roers, MD;  Location: [facility];  Service: General;  Laterality: Left;   INCISIONAL HERNIA REPAIR N/A 01/29/2016   Procedure: Sherald Hess HERNIORRHAPHY WITH MESH;  Surgeon: Franky Macho, MD;  Location: [facility];  Service: General;  Laterality: N/A;   INSERTION OF MESH  01/29/2016   Procedure: INSERTION OF MESH;  Surgeon: Franky Macho, MD;  Location: [facility];  Service: General;;   KNEE ARTHROSCOPY Left    LUMBAR LAMINECTOMY/DECOMPRESSION MICRODISCECTOMY Right 05/21/2019   Procedure: Microdiscectomy - Lumbar four-Lumbar five - right;  Surgeon: Tia Alert, MD;  Location: [facility];  Service: Neurosurgery;  Laterality: Right;   TUBAL LIGATION      OB History   No obstetric history on file.      Home Medications    Prior to Admission medications   Medication Sig Start Date End Date Taking? Authorizing Provider  albuterol (PROVENTIL) (2.5 MG/3ML) 0.083% nebulizer solution Take 3 mLs (2.5 mg total) by nebulization every 6 (six) hours as needed for wheezing or shortness of breath. 06/25/21   Junie Spencer, FNP  albuterol (VENTOLIN HFA) 108 (90 Base) MCG/ACT inhaler Inhale 2 puffs into the lungs every 6 (six) hours as needed for wheezing or shortness of breath. 07/17/21   Daryll Drown, NP  Bismuth/Metronidaz/Tetracyclin (PYLERA) [phone] MG CAPS Take 3 capsules by mouth 4 (four) times daily for 10 days. Patient not taking: Reported on 10/28/2023 10/27/23 11/06/23  Corbin Ade, MD  budesonide-formoterol [facility]) 80-4.5 MCG/ACT inhaler Inhale 2 puffs into  the lungs 2 (two) times daily. Patient taking differently: Inhale 2 puffs into the lungs 2 (two) times daily as needed (wheezing). 01/03/23   Raspet, Noberto Retort, PA-C  ferrous sulfate (SLOW RELEASE IRON) 160 (50 Fe) MG TBCR SR tablet Take 1 tablet (160 mg total) by mouth daily. 09/17/23   Letta Median, PA-C  fluconazole (DIFLUCAN) 150 MG tablet Take 1 tablet (150 mg total) by mouth daily. Take one tablet for vaginal itching related to antibiotic. May repeat in 3 days if symptoms not resolved. Hold your statin on the days you take this medication. 09/09/23   Lucretia Roers, MD  gabapentin (NEURONTIN) 600 MG tablet Take 600 mg by mouth 3 (three) times daily. 11/05/20   [provider]  linaclotide Karlene Einstein) 145 MCG CAPS capsule Take 1 capsule (145 mcg total) by mouth daily before breakfast. 09/01/23   Letta Median, PA-C  metFORMIN (GLUCOPHAGE) 500 MG tablet Take by mouth 2 (two) times daily with a meal.    [provider]  methocarbamol (ROBAXIN) 500 MG tablet Take 1 tablet (500 mg total) by  mouth 3 (three) times daily as needed for muscle spasms. Patient taking differently: Take 500 mg by mouth in the morning and at bedtime. 06/28/21   Jannifer Rodney A, FNP  ondansetron (ZOFRAN-ODT) 4 MG disintegrating tablet Take 1 tablet (4 mg total) by mouth every 8 (eight) hours as needed for nausea or vomiting. 12/17/22   Valentino Nose, NP  oxyCODONE-acetaminophen (PERCOCET/ROXICET) 5-325 MG tablet Take 1 tablet by mouth every 4 (four) hours as needed for moderate pain. Patient taking differently: Take 1 tablet by mouth in the morning, at noon, in the evening, and at bedtime. 05/21/19   Arman Bogus, MD  pantoprazole (PROTONIX) 40 MG tablet Take 1 tablet (40 mg total) by mouth daily before breakfast. 09/01/23   Letta Median, PA-C    Family History Family History  Problem Relation Age of Onset   Hypertension Mother    Hyperlipidemia Mother    Fibromyalgia Mother    Other  Mother        degenerative disc disease   Arthritis Mother    Kidney disease Mother    Diabetes Father    Hypertension Father    Hyperlipidemia Father    Heart disease Father 84   Heart attack Father    Stroke Father    Hyperlipidemia Brother    Hypertension Brother    Aneurysm Maternal Grandmother        AAA   Kidney disease Maternal Grandmother    Kidney disease Maternal Grandfather    Aneurysm Paternal Grandmother        AAA   Kidney disease Paternal Grandmother    Kidney disease Paternal Grandfather    Colon cancer Neg Hx    Inflammatory bowel disease Neg Hx     Social History Social History   Tobacco Use   Smoking status: Every Day    Current packs/day: 0.50    Average packs/day: 0.5 packs/day for 20.1 years (10.1 ttl pk-yrs)    Types: Cigarettes    Start date: 09/17/2003    Passive exposure: Never   Smokeless tobacco: Never  Vaping Use   Vaping status: Never Used  Substance Use Topics   Alcohol use: No   Drug use: No     Allergies   Fentanyl, Tramadol, and Nsaids   Review of Systems Review of Systems Per HPI  Physical Exam Triage Vital Signs ED Triage Vitals  Encounter Vitals Group     BP 11/04/23 0845 139/79     Systolic BP Percentile --      Diastolic BP Percentile --      Pulse Rate 11/04/23 0845 99     Resp 11/04/23 0845 (!) 22     Temp 11/04/23 0845 99.7 F (37.6 C)     Temp Source 11/04/23 0845 Oral     SpO2 11/04/23 0845 (!) 87 %     Weight --      Height --      Head Circumference --      Peak Flow --      Pain Score 11/04/23 0844 10     Pain Loc --      Pain Education --      Exclude from Growth Chart --    No data found.  Updated Vital Signs BP 139/79 (BP Location: Right Arm)   Pulse 99   Temp 99.7 F (37.6 C) (Oral)   Resp (!) 22   LMP 09/30/2023   SpO2 (!) 87%   Visual Acuity Right Eye Distance:  Left Eye Distance:   Bilateral Distance:    Right Eye Near:   Left Eye Near:    Bilateral Near:     Physical  Exam Vitals and nursing note reviewed.  Constitutional:      Appearance: Normal appearance.  HENT:     Head: Atraumatic.     Right Ear: Tympanic membrane and external ear normal.     Left Ear: Tympanic membrane and external ear normal.     Nose: Rhinorrhea present.     Mouth/Throat:     Mouth: Mucous membranes are moist.     Pharynx: Posterior oropharyngeal erythema present.  Eyes:     Extraocular Movements: Extraocular movements intact.     Conjunctiva/sclera: Conjunctivae normal.  Cardiovascular:     Rate and Rhythm: Normal rate and regular rhythm.     Heart sounds: Normal heart sounds.  Pulmonary:     Breath sounds: Wheezing present.     Comments: Decreased breath sounds throughout, unable to take a deep breath per patient.  Very mildly tachypneic particular with ambulation.  Speaking in full sentences Musculoskeletal:        General: Normal range of motion.     Cervical back: Normal range of motion and neck supple.  Skin:    General: Skin is warm and dry.  Neurological:     Mental Status: She is alert and oriented to person, place, and time.  Psychiatric:        Mood and Affect: Mood normal.        Thought Content: Thought content normal.      UC Treatments / Results  Labs (all labs ordered are listed, but only abnormal results are displayed) Labs Reviewed  POC COVID19/FLU A&B COMBO - Abnormal; Notable for the following components:      Result Value   Influenza A Antigen, POC Positive (*)    All other components within normal limits    EKG   Radiology No results found.  Procedures Procedures (including critical care time)  Medications Ordered in UC Medications  ipratropium-albuterol (DUONEB) 0.5-2.5 (3) MG/3ML nebulizer solution 3 mL (3 mLs Nebulization Given 11/04/23 0859)  albuterol (VENTOLIN HFA) 108 (90 Base) MCG/ACT inhaler 2 puff (2 puffs Inhalation Given 11/04/23 0920)    Initial Impression / Assessment and Plan / UC Course  I have reviewed the  triage vital signs and the nursing notes.  Pertinent labs & imaging results that were available during my care of the patient were reviewed by me and considered in my medical decision making (see chart for details).     Low-grade fever, mild tachypnea and hypoxic to 87% on room air in triage.  DuoNeb was given, saturations post neb down to 86% on room air.  She appears in no acute distress but discussed need for further evaluation and management in the emergency department setting given hypoxia despite intervention.  She did test positive for influenza A.  Albuterol inhaler administered in clinic, she declines EMS transport to the emergency department and wishes for her husband to take her via POV.  [facility] called for report.  Final Clinical Impressions(s) / UC Diagnoses   Final diagnoses:  Influenza A  Moderate persistent asthma with acute exacerbation  Acute respiratory failure with hypoxia [facility])   Discharge Instructions   None    ED Prescriptions   None    PDMP not reviewed this encounter.   Particia Nearing, [place] 11/04/23 1819

## 2023-11-04 NOTE — ED Notes (Signed)
Pt placed on 2L Nasal Cannula. O2 Sats improved.

## 2023-11-04 NOTE — ED Notes (Signed)
Phlebotomy was in here and patient was unsuccessfully stuck several times for blood cultures. Patient had fluids started, phlebotomy will come back for second set and Abx will be started not delaying care.

## 2023-11-05 DIAGNOSIS — R0602 Shortness of breath: Secondary | ICD-10-CM | POA: Diagnosis not present

## 2023-11-05 DIAGNOSIS — Z09 Encounter for follow-up examination after completed treatment for conditions other than malignant neoplasm: Secondary | ICD-10-CM | POA: Diagnosis not present

## 2023-11-05 DIAGNOSIS — J45909 Unspecified asthma, uncomplicated: Secondary | ICD-10-CM | POA: Diagnosis not present

## 2023-11-05 DIAGNOSIS — J189 Pneumonia, unspecified organism: Secondary | ICD-10-CM | POA: Diagnosis not present

## 2023-11-05 LAB — BLOOD CULTURE ID PANEL (REFLEXED) - BCID2

## 2023-11-05 NOTE — ED Notes (Signed)
Pt called back and stated that she had just come from here PCP and he had prescribed her antibiotics and other medications and that she was not coming back in to be seen that we could get in touch with her PCP  Pt was informed of critical result but states she did not want to come in to be seen

## 2023-11-05 NOTE — ED Notes (Addendum)
Lab called to state pt had positive blood culture in one bottle with gram positive cocci  Called and left a VM to call back  Dr Wallace Cullens notified and said to call pt back

## 2023-11-07 ENCOUNTER — Telehealth: Payer: Self-pay | Admitting: *Deleted

## 2023-11-07 LAB — CULTURE, BLOOD (ROUTINE X 2): Special Requests: ADEQUATE

## 2023-11-07 NOTE — Telephone Encounter (Addendum)
Received call from patient [phone]~ telephone.   Reports that she was seen at [facility] and ER on 2/18 for Flu A and Pneumonia. States that blood cultures were drawn as well.   Reports that she left ER AMA as bed was not available and she was to be placed in the hallway for treatment.   States that she was called from ER to return as cultures noted with gram positive cocci. Noted results as follows: Staphylococcus species DETECTED Abnormal    Staphylococcus epidermidis DETECTED Abnormal    Methicillin resistance mecA/C DETECTED Abnormal    Patient states that she made PCP aware and new orders were given for ABTx as follows: Doxycyline 100mg  PO BID x7 days Amoxicillin 500mg  PO TID X7 days  Patient called to notify Dr. Henreitta Leber she has been treating breast abscess.   Dr. Henreitta Leber made aware and recommended that patient return to ER for treatment. Advised that surgical pathology and cultures from breast abscess have not shown MRSA in the past and patient has not been treated for MRSA from surgical standpoint. Also advised that prior wound cultures are specific to the breast abscess.   Advised that positive blood culture is not surgical in nature and patient will need extensive follow up with hospitalization for treatment.   Call placed to patient and patient made aware. Verbalized understanding and reports that she will return to ER.

## 2023-11-08 ENCOUNTER — Telehealth (HOSPITAL_BASED_OUTPATIENT_CLINIC_OR_DEPARTMENT_OTHER): Payer: Self-pay | Admitting: *Deleted

## 2023-11-08 NOTE — Telephone Encounter (Signed)
 Post ED Visit - Positive Culture Follow-up  Culture report reviewed by antimicrobial stewardship pharmacist: [facility] []  Enzo Bi, Pharm.D. []  Celedonio Miyamoto, Pharm.D., BCPS AQ-ID []  Garvin Fila, Pharm.D., BCPS []  Georgina Pillion, Pharm.D., BCPS []  [place], [address].D., BCPS, AAHIVP []  Estella Husk, Pharm.D., BCPS, AAHIVP []  Lysle Pearl, PharmD, BCPS []  Phillips Climes, PharmD, BCPS []  Agapito Games, PharmD, BCPS []  Verlan Friends, PharmD []  Mervyn Gay, PharmD, BCPS [x]  Reinaldo Meeker, PharmD  [facility] []  Len Childs, PharmD []  Greer Pickerel, PharmD []  Adalberto Cole, PharmD []  Perlie Gold, Rph []  Lonell Face) Jean Rosenthal, PharmD []  Earl Many, PharmD []  Junita Push, PharmD []  Dorna Leitz, PharmD []  Terrilee Files, PharmD []  Lynann Beaver, PharmD []  Keturah Barre, PharmD []  Loralee Pacas, PharmD []  Bernadene Person, PharmD   Positive blood culture See previous note.  Pt was called on 2/21 to return to ED for evaluation. Pt reported she would return to ED  Patsey Berthold 11/08/2023, 10:11 AM

## 2023-11-09 LAB — CULTURE, BLOOD (ROUTINE X 2)
Culture: NO GROWTH
Special Requests: ADEQUATE

## 2023-11-10 ENCOUNTER — Emergency Department (HOSPITAL_COMMUNITY): Payer: Medicaid Other

## 2023-11-10 ENCOUNTER — Encounter (HOSPITAL_COMMUNITY): Payer: Self-pay | Admitting: Emergency Medicine

## 2023-11-10 ENCOUNTER — Other Ambulatory Visit: Payer: Self-pay

## 2023-11-10 ENCOUNTER — Emergency Department (HOSPITAL_COMMUNITY)
Admission: EM | Admit: 2023-11-10 | Discharge: 2023-11-10 | Disposition: A | Discharge status: Home / Self Care | Payer: Medicaid Other

## 2023-11-10 DIAGNOSIS — Z7984 Long term (current) use of oral hypoglycemic drugs: Secondary | ICD-10-CM | POA: Diagnosis not present

## 2023-11-10 DIAGNOSIS — I10 Essential (primary) hypertension: Secondary | ICD-10-CM | POA: Diagnosis not present

## 2023-11-10 DIAGNOSIS — R918 Other nonspecific abnormal finding of lung field: Secondary | ICD-10-CM | POA: Diagnosis not present

## 2023-11-10 DIAGNOSIS — R0902 Hypoxemia: Secondary | ICD-10-CM | POA: Diagnosis not present

## 2023-11-10 DIAGNOSIS — R030 Elevated blood-pressure reading, without diagnosis of hypertension: Secondary | ICD-10-CM | POA: Insufficient documentation

## 2023-11-10 DIAGNOSIS — J984 Other disorders of lung: Secondary | ICD-10-CM | POA: Diagnosis not present

## 2023-11-10 DIAGNOSIS — R799 Abnormal finding of blood chemistry, unspecified: Secondary | ICD-10-CM | POA: Diagnosis present

## 2023-11-10 LAB — CBC WITH DIFFERENTIAL/PLATELET
Abs Immature Granulocytes: 0.23 10*3/uL — ABNORMAL HIGH (ref 0.00–0.07)
Basophils Absolute: 0 10*3/uL (ref 0.0–0.1)
Basophils Relative: 0 %
Eosinophils Absolute: 0.1 10*3/uL (ref 0.0–0.5)
Eosinophils Relative: 1 %
HCT: 31.6 % — ABNORMAL LOW (ref 36.0–46.0)
Hemoglobin: 9 g/dL — ABNORMAL LOW (ref 12.0–15.0)
Immature Granulocytes: 2 %
Lymphocytes Relative: 21 %
Lymphs Abs: 2.7 10*3/uL (ref 0.7–4.0)
MCH: 18 pg — ABNORMAL LOW (ref 26.0–34.0)
MCHC: 28.5 g/dL — ABNORMAL LOW (ref 30.0–36.0)
MCV: 63.3 fL — ABNORMAL LOW (ref 80.0–100.0)
Monocytes Absolute: 0.8 10*3/uL (ref 0.1–1.0)
Monocytes Relative: 6 %
Neutro Abs: 9.1 10*3/uL — ABNORMAL HIGH (ref 1.7–7.7)
Neutrophils Relative %: 70 %
Platelets: 406 10*3/uL — ABNORMAL HIGH (ref 150–400)
RBC: 4.99 MIL/uL (ref 3.87–5.11)
RDW: 20.9 % — ABNORMAL HIGH (ref 11.5–15.5)
WBC: 12.9 10*3/uL — ABNORMAL HIGH (ref 4.0–10.5)
nRBC: 0.2 % (ref 0.0–0.2)

## 2023-11-10 LAB — BASIC METABOLIC PANEL
Anion gap: 13 (ref 5–15)
BUN: 5 mg/dL — ABNORMAL LOW (ref 6–20)
CO2: 21 mmol/L — ABNORMAL LOW (ref 22–32)
Calcium: 8.5 mg/dL — ABNORMAL LOW (ref 8.9–10.3)
Chloride: 101 mmol/L (ref 98–111)
Creatinine, Ser: 0.4 mg/dL — ABNORMAL LOW (ref 0.44–1.00)
GFR, Estimated: >60 mL/min (ref >60)
Glucose, Bld: 276 mg/dL — ABNORMAL HIGH (ref 70–99)
Potassium: 3.1 mmol/L — ABNORMAL LOW (ref 3.5–5.1)
Sodium: 135 mmol/L (ref 135–145)

## 2023-11-10 LAB — LACTIC ACID, PLASMA
Lactic Acid, Venous: 3.5 mmol/L (ref 0.5–1.9)
Lactic Acid, Venous: 2.8 mmol/L (ref 0.5–1.9)

## 2023-11-10 LAB — CBG MONITORING, ED: Glucose-Capillary: 200 mg/dL — ABNORMAL HIGH (ref 70–99)

## 2023-11-10 MED ORDER — DOXYCYCLINE HYCLATE 100 MG PO CAPS: 100.0000 mg | ORAL_CAPSULE | Freq: Two times a day (BID) | ORAL | 0 refills | Status: DC

## 2023-11-10 MED ORDER — AMOXICILLIN-POT CLAVULANATE 875-125 MG PO TABS: 1.0000 | ORAL_TABLET | Freq: Two times a day (BID) | ORAL | 0 refills | Status: DC

## 2023-11-10 MED ORDER — SODIUM CHLORIDE 0.9 % IV BOLUS
1000.0000 mL | Freq: Once | INTRAVENOUS | Status: AC
  Administered 2023-11-10: 1000 mL via INTRAVENOUS

## 2023-11-10 MED ORDER — POTASSIUM CHLORIDE CRYS ER 20 MEQ PO TBCR
40.0000 meq | EXTENDED_RELEASE_TABLET | Freq: Once | ORAL | Status: AC
  Administered 2023-11-10: 40 meq via ORAL
  Filled 2023-11-10: qty 2

## 2023-11-10 MED ORDER — IPRATROPIUM-ALBUTEROL 0.5-2.5 (3) MG/3ML IN SOLN
3.0000 mL | Freq: Once | RESPIRATORY_TRACT | Status: AC
  Administered 2023-11-10: 3 mL via RESPIRATORY_TRACT
  Filled 2023-11-10: qty 3

## 2023-11-10 MED ORDER — LABETALOL HCL 5 MG/ML IV SOLN
10.0000 mg | Freq: Once | INTRAVENOUS | Status: AC
  Administered 2023-11-10: 10 mg via INTRAVENOUS
  Filled 2023-11-10: qty 4

## 2023-11-10 NOTE — ED Notes (Signed)
Ice chips provided to patient.

## 2023-11-10 NOTE — ED Provider Notes (Addendum)
  Physical Exam  BP (!) 208/102   Pulse (!) 105   Temp 98.7 F (37.1 C) (Oral)   Resp 18   Ht 5\' 5"  (1.651 m)   Wt 117.9 kg   LMP 09/30/2023 (Approximate)   SpO2 94%   BMI 43.27 kg/m   Physical Exam  Procedures  Procedures  ED Course / MDM    Medical Decision Making Amount and/or Complexity of Data Reviewed Labs: ordered. Radiology: ordered.  Risk Prescription drug management.   Assuming care of patient from Dr. Caralee Ates   Patient in the ED for positive blood cultures. She has been cleared by ID, as likely has contaminant. BP high, CXR ordered to r/o pulm edema. Recently had PNA. Albuterol given.   According to Dr. Caralee Ates, plan is to d/c is Xray reassuring. Pt comfortable with the plan.    Patient had no complains, no concerns from the nursing side. Will continue to monitor.    Derwood Kaplan, MD 11/10/23 1646   7:11 PM Patient's x-ray shows pneumonia. I reassessed the patient.  Patient states that she has been on Amoxil 5 mg 3 times daily since last Wednesday along with doxycycline 100 mg twice daily. Clinically, she has actually gotten better.  She continues to use inhalers throughout the day and has been walking.  She does not feel she needs to get admitted.  She does want to extend the antibiotics as per her conversation with Dr. Caralee Ates. Stable for discharge, return precautions discussed with the patient and family.   Derwood Kaplan, MD 11/10/23 [phone]

## 2023-11-10 NOTE — Telephone Encounter (Signed)
 Call placed to patient to follow up. Upon review of chart, patient has not returned to ER for evaluation or treatment.   Patient reports that she did contact ER and spoke with "Casimiro Needle". Reports that she was advised that she did not need to return to ER if she was asymptomatic (no fever/ chills, nausea, vomiting, diarrhea).  Advised patient that though she may not be experiencing symptoms, she does have noted infection in her blood. Stressed importance of correct treatment and urgency to be treated.   Patient eventually agreed to return to ER. Patient was hesitant as she feels that she was treated very poorly in the ER before she left AMA. States that she feels that she was disrespected and reports that she will leave again if her concerns are not addressed appropriately.   Call placed to ER and charge nurse made aware of patients impending return.

## 2023-11-10 NOTE — Discharge Instructions (Signed)
 I discussed your case with our infectious disease doctor.  They recommended continuing the oral antibiotics and to follow-up with your doctor.  Please follow-up later this week as scheduled.  Please follow-up to have your blood pressure rechecked as well.  Start back on your blood pressure medication.  Return to the ER for worsening symptoms.

## 2023-11-10 NOTE — ED Triage Notes (Signed)
 Pt came to the ED last week. She was called this morning and told to come to the ED immediately due to 2 strands of MRSA in her blood. PT is being seen by dr Henreitta Leber for an infection in her breast. Pt reports she was diagnosed with PNA and Flu A last week. Pt is irritated by triage questions.

## 2023-11-10 NOTE — ED Provider Notes (Signed)
 [facility] EMERGENCY DEPARTMENT AT [facility] Provider Note   CSN: [id number] Arrival date & time: 11/10/23  1100     History {Add pertinent medical, surgical, social history, OB history to HPI:1} Chief Complaint  Patient presents with   abnormal labs    TAHJAE DURR is a 38 y.o. female.  38 year old female presenting to the emergency department today concern for positive blood cultures.  The patient has been following up with her breast surgeon recent was called by their office today when blood cultures came back positive for questionable MRSA.  The patient states that she was recently diagnosed with pneumonia and has been on amoxicillin and doxycycline.  The patient states that she is feeling much better and denies any acute complaints currently.  She states that she only has a few more days of these antibiotics and is scheduled to follow-up with her primary care provider later this week.        Home Medications Prior to Admission medications   Medication Sig Start Date End Date Taking? Authorizing Provider  Bismuth/Metronidaz/Tetracyclin (PYLERA) [phone] MG CAPS Take 3 capsules by mouth 4 (four) times daily for 10 days. Patient not taking: Reported on 10/28/2023 10/27/23 11/06/23  Corbin Ade, MD  ferrous sulfate (SLOW RELEASE IRON) 160 (50 Fe) MG TBCR SR tablet Take 1 tablet (160 mg total) by mouth daily. 09/17/23   Letta Median, PA-C  gabapentin (NEURONTIN) 600 MG tablet Take 600 mg by mouth 3 (three) times daily. 11/05/20   [provider]  linaclotide Karlene Einstein) 145 MCG CAPS capsule Take 1 capsule (145 mcg total) by mouth daily before breakfast. 09/01/23   Letta Median, PA-C  metFORMIN (GLUCOPHAGE) 500 MG tablet Take 500 mg by mouth 2 (two) times daily with a meal.    [provider]  methocarbamol (ROBAXIN) 500 MG tablet Take 1 tablet (500 mg total) by mouth 3 (three) times daily as needed for muscle spasms. Patient taking  differently: Take 500 mg by mouth in the morning and at bedtime. 06/28/21   Junie Spencer, FNP  MOUNJARO 2.5 MG/0.5ML Pen Inject 2.5 mg into the skin once a week. Patient not taking: Reported on 11/04/2023 10/17/23   [provider]  MOUNJARO 5 MG/0.5ML Pen Inject 5 mg into the skin once a week. Patient not taking: Reported on 11/04/2023 10/16/23   [provider]  ondansetron (ZOFRAN-ODT) 4 MG disintegrating tablet Take 1 tablet (4 mg total) by mouth every 8 (eight) hours as needed for nausea or vomiting. 12/17/22   Valentino Nose, NP  oxyCODONE-acetaminophen (PERCOCET/ROXICET) 5-325 MG tablet Take 1 tablet by mouth every 4 (four) hours as needed for moderate pain. Patient taking differently: Take 1 tablet by mouth in the morning, at noon, in the evening, and at bedtime. 05/21/19   Arman Bogus, MD      Allergies    Fentanyl, Tramadol, Nsaids, and Vicks nyquil cold & flu night [dm-apap-cpm]    Review of Systems   Review of Systems  All other systems reviewed and are negative.   Physical Exam Updated Vital Signs BP (!) 175/104   Pulse (!) 105   Temp 98.7 F (37.1 C) (Oral)   Resp 18   Ht 5\' 5"  (1.651 m)   Wt 117.9 kg   LMP 09/30/2023 (Approximate)   SpO2 96%   BMI 43.27 kg/m  Physical Exam Vitals and nursing note reviewed.   Gen: NAD Eyes: PERRL, EOMI HEENT: no oropharyngeal swelling Neck:  trachea midline Resp: clear to auscultation bilaterally Card: RRR, no murmurs, rubs, or gallops Abd: nontender, nondistended Extremities: no calf tenderness, no edema Vascular: 2+ radial pulses bilaterally, 2+ DP pulses bilaterally Skin: no rashes Psyc: acting appropriately   ED Results / Procedures / Treatments   Labs (all labs ordered are listed, but only abnormal results are displayed) Labs Reviewed  CBC WITH DIFFERENTIAL/PLATELET - Abnormal; Notable for the following components:      Result Value   WBC 12.9 (*)    Hemoglobin 9.0 (*)    HCT 31.6 (*)     MCV 63.3 (*)    MCH 18.0 (*)    MCHC 28.5 (*)    RDW 20.9 (*)    Platelets 406 (*)    Neutro Abs 9.1 (*)    Abs Immature Granulocytes 0.23 (*)    All other components within normal limits  BASIC METABOLIC PANEL - Abnormal; Notable for the following components:   Potassium 3.1 (*)    CO2 21 (*)    Glucose, Bld 276 (*)    BUN 5 (*)    Creatinine, Ser 0.40 (*)    Calcium 8.5 (*)    All other components within normal limits  LACTIC ACID, PLASMA - Abnormal; Notable for the following components:   Lactic Acid, Venous 3.5 (*)    All other components within normal limits  CBG MONITORING, ED - Abnormal; Notable for the following components:   Glucose-Capillary 200 (*)    All other components within normal limits  CULTURE, BLOOD (ROUTINE X 2)  CULTURE, BLOOD (ROUTINE X 2)  LACTIC ACID, PLASMA    EKG None  Radiology No results found.  Procedures Procedures  {Document cardiac monitor, telemetry assessment procedure when appropriate:1}  Medications Ordered in ED Medications  sodium chloride 0.9 % bolus 1,000 mL (1,000 mLs Intravenous Bolus 11/10/23 1445)  sodium chloride 0.9 % bolus 1,000 mL (1,000 mLs Intravenous Bolus 11/10/23 1445)    ED Course/ Medical Decision Making/ A&P   {   Click here for ABCD2, HEART and other calculatorsREFRESH Note before signing :1}                              Medical Decision Making 38 year old female presenting to the emergency department today with questionable blood culture being sent in by her outpatient provider today.  Basic labs as well as a lactic acid and repeat blood cultures were pending.  I did call and discussed the patient's case with infectious disease as the last blood cultures I am seeing appear to have no growth.  I did call and discussed her case with Dr. Margarette Canada.  He recommended against any IV antibiotics and felt that she could be discharged on oral antibiotics for a longer period of time.  The patient's initial lactate came back  mildly elevated at 3.5.  With IV fluids this is downtrending.  The patient's blood pressure is elevated here but she does not have any symptoms consistent with endorgan dysfunction.  Ultimately, she is discharged with return precautions and does have 1 more culture pending at this time.  The patient's labs are reassuring.  Her initial lactic acid is 3.5 and is improving to 2.8 after 1 liter of IV fluids.  The patient states that her blood pressure has been elevated and her doctor took her off of her blood pressure medications recently.  She denies any chest pain.  Obtain x-ray here to evaluate  for any pulmonary edema and if this is negative think that she is stable for discharge.  She states she is feeling well.  Her heart rate is in the 90s and she is resting comfortably.  I will give her a DuoNeb but she does have some mild wheezing on exam.  She does have a history of asthma and does not feel that this is exacerbated at this time.  Amount and/or Complexity of Data Reviewed Labs: ordered. Radiology: ordered.  Risk Prescription drug management.   ***  {Document critical care time when appropriate:1} {Document review of labs and clinical decision tools ie heart score, Chads2Vasc2 etc:1}  {Document your independent review of radiology images, and any outside records:1} {Document your discussion with family members, caretakers, and with consultants:1} {Document social determinants of health affecting pt's care:1} {Document your decision making why or why not admission, treatments were needed:1} Final Clinical Impression(s) / ED Diagnoses Final diagnoses:  None    Rx / DC Orders ED Discharge Orders     None

## 2023-11-14 DIAGNOSIS — M539 Dorsopathy, unspecified: Secondary | ICD-10-CM | POA: Diagnosis not present

## 2023-11-15 LAB — CULTURE, BLOOD (ROUTINE X 2)
Culture: NO GROWTH
Culture: NO GROWTH
Special Requests: ADEQUATE
Special Requests: ADEQUATE

## 2023-11-25 ENCOUNTER — Ambulatory Visit (INDEPENDENT_AMBULATORY_CARE_PROVIDER_SITE_OTHER): Payer: Medicaid Other | Admitting: General Surgery

## 2023-11-25 ENCOUNTER — Encounter: Payer: Self-pay | Admitting: General Surgery

## 2023-11-25 VITALS — BP 139/82 | HR 112 | Temp 98.2°F | Resp 18 | Ht 65.0 in | Wt 264.0 lb

## 2023-11-25 DIAGNOSIS — N611 Abscess of the breast and nipple: Secondary | ICD-10-CM | POA: Diagnosis not present

## 2023-11-25 NOTE — Progress Notes (Unsigned)
 [facility]  Drainage from the breast continues. She is still smoking. Husband is a little frustrated with fact this has not healed. Recent treatment for flu and PNA. She also had bacteremia.   BP 139/82   Pulse (!) 112   Temp 98.2 F (36.8 C) (Oral)   Resp 18   Ht 5\' 5"  (1.651 m)   Wt 264 lb (119.7 kg)   LMP 09/30/2023 (Approximate)   SpO2 92%   BMI 43.93 kg/m  Purulent appearing drainage, no redness on skin, probed, replaced with hydrofera   Patient with recurrent breast infections s/p excision and packing. She continues to smoke. Reiterated that a reexcision would require a large wound and packing. And this could still recur. Encouraged smoking cessation. Explained to husband issue. Again she has had recurrent breast infections prior to me ever seeing her. I have tried to excise this but we are changing nothing about her tissue, her health or her smoking, so it is not unusual for this to continue.   Change the hydrofera blue every 2-3 days or as needed.  Monitor for any worsening redness, pain or swelling.  Call if you have this, will have to prescribe another antibiotic. This hydrofera blue has antibiotics in it, so trying to not give you an oral antibiotic given the fact you were just on a bunch of antibiotics.   Future Appointments  Date Time Provider Department Center  12/03/2023  9:30 AM Hal Neer [facility]  12/09/2023 11:15 AM Lucretia Roers, MD [facility] None   Algis Greenhouse, MD [facility] [address] [place] [place], [place] [address] [phone] (office)

## 2023-11-25 NOTE — Patient Instructions (Addendum)
 Change the hydrofera blue every 2-3 days or as needed.  Monitor for any worsening redness, pain or swelling.  Call if you have this, will have to prescribe another antibiotic. This hydrofera blue has antibiotics in it, so trying to not give you an oral antibiotic given the fact you were just on a bunch of antibiotics.

## 2023-12-01 NOTE — Progress Notes (Deleted)
 Referring Provider: Carmel Sacramento, NP Primary Care Physician:  Carmel Sacramento, NP Primary GI Physician: Dr. Jena Gauss  No chief complaint on file.   HPI:   Patricia Stanton is a 38 y.o. female with history of type 2 diabetes, HTN, HLD, obesity, endometriosis, depression, anxiety, iron deficiency, menorrhagia, IBS-C, GERD, recent H. pylori diagnosis, presenting today for follow-up.   Last seen in the office 09/01/2023.  Constipation not adequately controlled with Linzess 72 mcg daily.  Continued with intermittent RUQ abdominal pain and swelling that started around Thanksgiving of 2024.  Reported symptoms worsening after meals with associated pressure and bloating that extended across the upper abdomen.  Denied nausea or vomiting.  No overt GI bleeding.  Noted hemoglobin had dropped to 9.1 in October, previously in the 12-13 range.  She continued with menorrhagia and was not taking an iron supplementation.  Recommendations included labs, RUQ ultrasound, EGD, colonoscopy, start pantoprazole 40 mg daily, increase Linzess to 145 mcg daily.   Labs completed and were remarkable for WBC 13.9, hemoglobin 9.6 with microcytic indices, glucose 285, all components of iron panel were low.  Recommended starting oral iron daily.  RUQ ultrasound with prior cholecystectomy, fatty liver.   Colonoscopy 10/22/2023: Entirely normal exam.  Recommended repeat colonoscopy in 10 years for screening purposes.  EGD 10/22/2023: Normal esophagus, small hiatal hernia, gastric and duodenal erosions biopsied.  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  Recommended Pylera x 14 days, increase Protonix to 40 mg twice daily.  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 (HCC) 03/25/2019   Mood disorder (HCC)    Obesity    Ovarian cyst    Tobacco abuse     Past Surgical History:  Procedure Laterality Date   BIOPSY  10/22/2023   Procedure: BIOPSY;  Surgeon: Corbin Ade, MD;  [facility];  Service: Endoscopy;;   BREAST BIOPSY Left 06/17/2023   [place] LT BREAST BX W LOC DEV 1ST LESION IMG BX SPEC US GUIDE 06/17/2023 [facility]   BREAST BIOPSY Left 06/17/2023   [place] LT BREAST BX W LOC DEV EA ADD LESION IMG BX SPEC US GUIDE 06/17/2023 [facility]   BREAST BIOPSY Left 06/17/2023   [place] LT BREAST BX W LOC DEV EA ADD LESION IMG BX SPEC US GUIDE 06/17/2023 [facility]   BREAST BIOPSY Left 07/11/2023   Procedure: LEFT BREAST BIOPSY;  Surgeon: Lucretia Roers, MD;  Location: [facility] ORS;  Service: General;  Laterality: Left;   CESAREAN SECTION  2008   [facility]   CHOLECYSTECTOMY N/A 11/25/2014   Procedure: LAPAROSCOPIC CHOLECYSTECTOMY;  Surgeon: Franky Macho Md, MD;  Location: [facility];  Service: General;  Laterality: N/A;   COLONOSCOPY WITH PROPOFOL N/A 10/22/2023   Procedure: COLONOSCOPY WITH PROPOFOL;  Surgeon: Corbin Ade, MD;  Location: [facility];  Service: Endoscopy;  Laterality: N/A;  900am, asa 3   ESOPHAGOGASTRODUODENOSCOPY N/A 12/21/2015   Dr. Jena Gauss: normal esophagus, non-bleeding erosive gastropathy, normal second portion of duodenum. Reactive gastropathy/chemical gastritis, negative H.pylori  ESOPHAGOGASTRODUODENOSCOPY (EGD) WITH PROPOFOL N/A 11/23/2020    Surgeon: Corbin Ade, MD; normal esophagus, small hiatal hernia, normal examined duodenum.   ESOPHAGOGASTRODUODENOSCOPY (EGD) WITH PROPOFOL N/A 10/22/2023   Procedure: ESOPHAGOGASTRODUODENOSCOPY (EGD) WITH PROPOFOL;  Surgeon: Corbin Ade, MD;  [facility];  Service: Endoscopy;  Laterality: N/A;   FEMUR FRACTURE SURGERY  Left 1995   tree fell on her   HERNIA REPAIR     INCISION AND DRAINAGE ABSCESS Left 07/11/2023   Procedure: INCISION AND DRAINAGE ABSCESS, BREAST;  Surgeon: Lucretia Roers, MD;  Location: [facility];  Service: General;  Laterality: Left;   INCISIONAL HERNIA REPAIR N/A 01/29/2016   Procedure: Sherald Hess HERNIORRHAPHY WITH MESH;  Surgeon: Franky Macho, MD;  Location: [facility];  Service: General;  Laterality: N/A;   INSERTION OF MESH  01/29/2016   Procedure: INSERTION OF MESH;  Surgeon: Franky Macho, MD;  Location: [facility];  Service: General;;   KNEE ARTHROSCOPY Left    LUMBAR LAMINECTOMY/DECOMPRESSION MICRODISCECTOMY Right 05/21/2019   Procedure: Microdiscectomy - Lumbar four-Lumbar five - right;  Surgeon: Tia Alert, MD;  [facility];  Service: Neurosurgery;  Laterality: Right;   TUBAL LIGATION      Current Outpatient Medications  Medication Sig Dispense Refill   Bismuth/Metronidaz/Tetracyclin (PYLERA) 140-125-125 MG CAPS Take 3 capsules by mouth 4 (four) times daily for 10 days. 120 capsule 0   ferrous sulfate (SLOW RELEASE IRON) 160 (50 Fe) MG TBCR SR tablet Take 1 tablet (160 mg total) by mouth daily. 30 tablet 5   gabapentin (NEURONTIN) 600 MG tablet Take 600 mg by mouth 3 (three) times daily.     linaclotide (LINZESS) 145 MCG CAPS capsule Take 1 capsule (145 mcg total) by mouth daily before breakfast. 30 capsule 5   metFORMIN (GLUCOPHAGE) 500 MG tablet Take 500 mg by mouth 2 (two) times daily with a meal.     methocarbamol (ROBAXIN) 500 MG tablet Take 1 tablet (500 mg total) by mouth 3 (three) times daily as needed for muscle spasms. (Patient taking differently: Take 500 mg by mouth in the morning and at bedtime.) 90 tablet 2   MOUNJARO 2.5 MG/0.5ML Pen Inject 2.5 mg into the skin once a week. (Patient not taking: Reported on 11/04/2023)     MOUNJARO 5 MG/0.5ML Pen Inject 5 mg into the skin once a week. (Patient not taking: Reported on 11/04/2023)     ondansetron (ZOFRAN-ODT) 4 MG  disintegrating tablet Take 1 tablet (4 mg total) by mouth every 8 (eight) hours as needed for nausea or vomiting. 20 tablet 0   oxyCODONE-acetaminophen (PERCOCET/ROXICET) 5-325 MG tablet Take 1 tablet by mouth every 4 (four) hours as needed for moderate pain. (Patient taking differently: Take 1 tablet by mouth in the morning, at noon, in the evening, and at bedtime.) 30 tablet 0   No current facility-administered medications for this visit.    Allergies as of 12/03/2023 - Review Complete 11/25/2023  Allergen Reaction Noted   Fentanyl Other (See Comments) 10/15/2023   Tramadol Other (See Comments) 05/21/2019   Nsaids Other (See Comments) 02/28/2023   Vicks nyquil cold & flu night [dm-apap-cpm] Hives and Other (See Comments) 11/04/2023    Family History  Problem Relation Age of Onset   Hypertension Mother    Hyperlipidemia Mother    Fibromyalgia Mother    Other Mother        degenerative disc disease   Arthritis Mother    Kidney disease Mother    Diabetes Father  Hypertension Father    Hyperlipidemia Father    Heart disease Father 44   Heart attack Father    Stroke Father    Hyperlipidemia Brother    Hypertension Brother    Aneurysm Maternal Grandmother        AAA   Kidney disease Maternal Grandmother    Kidney disease Maternal Grandfather    Aneurysm Paternal Grandmother        AAA   Kidney disease Paternal Grandmother    Kidney disease Paternal Grandfather    Colon cancer Neg Hx    Inflammatory bowel disease Neg Hx     Social History   Socioeconomic History   Marital status: Divorced    Spouse name: Not on file   Number of children: 1   Years of education: 14   Highest education level: Not on file  Occupational History   Occupation: disability  Tobacco Use   Smoking status: Every Day    Current packs/day: 0.50    Average packs/day: 0.5 packs/day for 20.2 years (10.1 ttl pk-yrs)    Types: Cigarettes    Start date: 09/17/2003    Passive exposure: Never    Smokeless tobacco: Never  Vaping Use   Vaping status: Never Used  Substance and Sexual Activity   Alcohol use: No   Drug use: No   Sexual activity: Yes    Birth control/protection: Surgical    Comment: BTL  Other Topics Concern   Not on file  Social History Narrative   Disabled from nursing   Lives at home with daughter Karna Christmas   Social Drivers of Health   Financial Resource Strain: Low Risk  (12/27/2022)   Overall Financial Resource Strain (CARDIA)    Difficulty of Paying Living Expenses: Not hard at all  Food Insecurity: No Food Insecurity (12/27/2022)   Hunger Vital Sign    Worried About Running Out of Food in the Last Year: Never true    Ran Out of Food in the Last Year: Never true  Transportation Needs: No Transportation Needs (12/27/2022)   PRAPARE - Administrator, Civil Service (Medical): No    Lack of Transportation (Non-Medical): No  Physical Activity: Inactive (12/27/2022)   Exercise Vital Sign    Days of Exercise per Week: 0 days    Minutes of Exercise per Session: 0 min  Stress: Stress Concern Present (12/27/2022)   [employer] of Occupational Health - Occupational Stress Questionnaire    Feeling of Stress : Very much  Social Connections: Moderately Isolated (12/27/2022)   Social Connection and Isolation Panel [NHANES]    Frequency of Communication with Friends and Family: More than three times a week    Frequency of Social Gatherings with Friends and Family: More than three times a week    Attends Religious Services: Never    Database administrator or Organizations: No    Attends Banker Meetings: Never    Marital Status: Living with partner    Review of Systems: Gen: Denies fever, chills, anorexia. Denies fatigue, weakness, weight loss.  CV: Denies chest pain, palpitations, syncope, peripheral edema, and claudication. Resp: Denies dyspnea at rest, cough, wheezing, coughing up blood, and pleurisy. GI: Denies vomiting blood,  jaundice, and fecal incontinence.   Denies dysphagia or odynophagia. Derm: Denies rash, itching, dry skin Psych: Denies depression, anxiety, memory loss, confusion. No homicidal or suicidal ideation.  Heme: Denies bruising, bleeding, and enlarged lymph nodes.  Physical Exam: LMP 09/30/2023 (Approximate)  General:  Alert and oriented. No distress noted. Pleasant and cooperative.  Head:  Normocephalic and atraumatic. Eyes:  Conjuctiva clear without scleral icterus. Heart:  S1, S2 present without murmurs appreciated. Lungs:  Clear to auscultation bilaterally. No wheezes, rales, or rhonchi. No distress.  Abdomen:  +BS, soft, non-tender and non-distended. No rebound or guarding. No HSM or masses noted. Msk:  Symmetrical without gross deformities. Normal posture. Extremities:  Without edema. Neurologic:  Alert and  oriented x4 Psych:  Normal mood and affect.    Assessment:     Plan:  ***   Ermalinda Memos, PA-C [facility] 12/03/2023

## 2023-12-03 ENCOUNTER — Ambulatory Visit: Payer: Medicaid Other | Admitting: Gastroenterology

## 2023-12-09 ENCOUNTER — Ambulatory Visit (INDEPENDENT_AMBULATORY_CARE_PROVIDER_SITE_OTHER): Admitting: General Surgery

## 2023-12-09 ENCOUNTER — Encounter: Payer: Self-pay | Admitting: General Surgery

## 2023-12-09 VITALS — BP 122/84 | HR 104 | Temp 97.9°F | Resp 16 | Ht 65.0 in | Wt 265.0 lb

## 2023-12-09 DIAGNOSIS — N611 Abscess of the breast and nipple: Secondary | ICD-10-CM | POA: Diagnosis not present

## 2023-12-09 NOTE — Patient Instructions (Signed)
 Continue the packing and call if need to see me before the 4 weeks. I am unfortunately out at 3 weeks.

## 2023-12-09 NOTE — Progress Notes (Signed)
 [facility]  Patient had a reaction to the hydrofera blue and had to go back to iodoform packing. She is packing it every other day now. She is doing better.    BP 122/84   Pulse (!) 104   Temp 97.9 F (36.6 C) (Oral)   Resp 16   Ht 5\' 5"  (1.651 m)   Wt 265 lb (120.2 kg)   LMP 09/30/2023 (Approximate)   SpO2 95%   BMI 44.10 kg/m  Left breast medial wound with some epithelized tunnel, probed and silver nitrate, and packed with iodoform Left breast lateral wound about 2.5cm deep, silver nitrate and peroxide to area, bleeding noted, packing replaced   Patient with recurrent left breast abscesses. She is trying to reduce her smoking. She is packing the area every other day. Told her to do daily now. Told her to continue with the peroxide to clean area and irritate the tissue.   Continue the packing and call if need to see me before the 4 weeks. I am unfortunately out at 3 weeks.   Future Appointments  Date Time Provider Department Center  01/06/2024 10:15 AM Lucretia Roers, MD [facility] None     Algis Greenhouse, MD [facility] [address] [place], [place] [address] [phone] (office)

## 2023-12-15 DIAGNOSIS — E559 Vitamin D deficiency, unspecified: Secondary | ICD-10-CM | POA: Diagnosis not present

## 2023-12-15 DIAGNOSIS — R0602 Shortness of breath: Secondary | ICD-10-CM | POA: Diagnosis not present

## 2023-12-15 DIAGNOSIS — Z760 Encounter for issue of repeat prescription: Secondary | ICD-10-CM | POA: Diagnosis not present

## 2023-12-15 DIAGNOSIS — Z6841 Body Mass Index (BMI) 40.0 and over, adult: Secondary | ICD-10-CM | POA: Diagnosis not present

## 2023-12-15 DIAGNOSIS — J45909 Unspecified asthma, uncomplicated: Secondary | ICD-10-CM | POA: Diagnosis not present

## 2023-12-15 DIAGNOSIS — M539 Dorsopathy, unspecified: Secondary | ICD-10-CM | POA: Diagnosis not present

## 2023-12-15 DIAGNOSIS — E119 Type 2 diabetes mellitus without complications: Secondary | ICD-10-CM | POA: Diagnosis not present

## 2024-01-06 ENCOUNTER — Encounter: Payer: Self-pay | Admitting: General Surgery

## 2024-01-06 ENCOUNTER — Ambulatory Visit: Admitting: General Surgery

## 2024-01-06 VITALS — BP 166/104 | HR 97 | Temp 98.4°F | Resp 14 | Ht 65.0 in | Wt 266.0 lb

## 2024-01-06 DIAGNOSIS — N611 Abscess of the breast and nipple: Secondary | ICD-10-CM | POA: Diagnosis not present

## 2024-01-06 MED ORDER — AMOXICILLIN-POT CLAVULANATE 875-125 MG PO TABS: 1.0000 | ORAL_TABLET | Freq: Two times a day (BID) | ORAL | 0 refills | Status: AC

## 2024-01-06 NOTE — Progress Notes (Unsigned)
 [facility] History and Physical  Recurrent left breast abscess    Chief Complaint   Follow-up     Patricia Stanton is a 38 y.o. female.  HPI: Patricia Stanton is well known to me after having a large left breast abscess that I attempted incision and drainage on and drain placement in October 2024. She has been healing slowly and getting periodic infections in the two areas where the drains were placed. She has required multiple rounds of antibiotic and the areas continue to drain at times, further delaying the healing. She continues to vape but has cut back on smoking. She is frustrated. We are now over 6 months out.    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) 03/25/2019   Mood disorder (HCC)    Obesity    Ovarian cyst    Tobacco abuse     Past Surgical History:  Procedure Laterality Date   BIOPSY  10/22/2023   Procedure: BIOPSY;  Surgeon: Suzette Espy, MD;  [facility]: [facility];  Service: Endoscopy;;   BREAST BIOPSY Left 06/17/2023   US  LT BREAST BX W LOC DEV 1ST LESION IMG BX SPEC US  GUIDE 06/17/2023 [facility]   BREAST BIOPSY Left 06/17/2023   US  LT BREAST BX W LOC DEV EA ADD LESION IMG BX SPEC US  GUIDE 06/17/2023 [facility]   BREAST BIOPSY Left 06/17/2023   US  LT BREAST BX W LOC DEV EA ADD LESION IMG BX SPEC US  GUIDE 06/17/2023 [facility]   BREAST BIOPSY Left 07/11/2023   Procedure: LEFT BREAST BIOPSY;  Surgeon: Awilda Bogus, MD;  Location: [facility];  Service: General;  Laterality: Left;   CESAREAN SECTION  2008   MMH   CHOLECYSTECTOMY N/A 11/25/2014   Procedure:  LAPAROSCOPIC CHOLECYSTECTOMY;  Surgeon: Alanda Allegra Md, MD;  Location: [facility];  Service: General;  Laterality: N/A;   COLONOSCOPY WITH PROPOFOL  N/A 10/22/2023   Procedure: COLONOSCOPY WITH PROPOFOL ;  Surgeon: Suzette Espy, MD;  [facility];  Service: Endoscopy;  Laterality: N/A;  900am, asa 3   ESOPHAGOGASTRODUODENOSCOPY N/A 12/21/2015   Dr. Riley Cheadle: normal esophagus, non-bleeding erosive gastropathy, normal second portion of duodenum. Reactive gastropathy/chemical gastritis, negative H.pylori   ESOPHAGOGASTRODUODENOSCOPY (EGD) WITH PROPOFOL  N/A 11/23/2020    Surgeon: Suzette Espy, MD; normal esophagus, small hiatal hernia, normal examined duodenum.   ESOPHAGOGASTRODUODENOSCOPY (EGD) WITH PROPOFOL  N/A 10/22/2023   Procedure: ESOPHAGOGASTRODUODENOSCOPY (EGD) WITH PROPOFOL ;  Surgeon: Suzette Espy, MD;  [facility];  Service: Endoscopy;  Laterality: N/A;   FEMUR FRACTURE SURGERY Left 1995   tree fell on her   HERNIA REPAIR     INCISION AND DRAINAGE ABSCESS Left 07/11/2023   Procedure: INCISION AND DRAINAGE ABSCESS, BREAST;  Surgeon: Awilda Bogus, MD;  Location: [facility];  Service: General;  Laterality: Left;   INCISIONAL HERNIA REPAIR N/A 01/29/2016   Procedure: [name] HERNIORRHAPHY WITH MESH;  Surgeon: Alanda Allegra, MD;  Location: [facility];  Service: General;  Laterality: N/A;   INSERTION OF MESH  01/29/2016  Procedure: INSERTION OF MESH;  Surgeon: Alanda Allegra, MD;  Location: [facility];  Service: General;;   KNEE ARTHROSCOPY Left    LUMBAR LAMINECTOMY/DECOMPRESSION MICRODISCECTOMY Right 05/21/2019   Procedure: Microdiscectomy - Lumbar four-Lumbar five - right;  Surgeon: Isadora Mar, MD;  [facility];  Service: Neurosurgery;  Laterality: Right;   TUBAL LIGATION      Family History  Problem Relation Age of Onset   Hypertension Mother    Hyperlipidemia Mother    Fibromyalgia Mother    Other Mother        degenerative disc disease   Arthritis Mother     Kidney disease Mother    Diabetes Father    Hypertension Father    Hyperlipidemia Father    Heart disease Father 49   Heart attack Father    Stroke Father    Hyperlipidemia Brother    Hypertension Brother    Aneurysm Maternal Grandmother        AAA   Kidney disease Maternal Grandmother    Kidney disease Maternal Grandfather    Aneurysm Paternal Grandmother        AAA   Kidney disease Paternal Grandmother    Kidney disease Paternal Grandfather    Colon cancer Neg Hx    Inflammatory bowel disease Neg Hx     Social History   Tobacco Use   Smoking status: Every Day    Current packs/day: 0.50    Average packs/day: 0.5 packs/day for 20.3 years (10.2 ttl pk-yrs)    Types: Cigarettes    Start date: 09/17/2003    Passive exposure: Never   Smokeless tobacco: Never  Vaping Use   Vaping status: Never Used  Substance Use Topics   Alcohol use: No   Drug use: No    Medications: I have reviewed the patient's current medications. Allergies as of 01/06/2024       Reactions   Fentanyl  Other (See Comments)   Patient does not wish to ever receive fentanyl  again.  She stated this makes her extremely irritable to others around her for approximately 24 hours.     Tramadol  Other (See Comments)   Exacerbates her asthma    Nsaids Other (See Comments)   Unknown    Vicks Nyquil Cold & Flu Night [dm-apap-cpm] Hives, Other (See Comments)   Fever blisters around mouth        Medication List        Accurate as of January 06, 2024 10:45 AM. If you have any questions, ask your nurse or doctor.          Bismuth /Metronidaz/Tetracyclin 140-125-125 MG Caps Commonly known as: Pylera Take 3 capsules by mouth 4 (four) times daily for 10 days.   gabapentin  600 MG tablet Commonly known as: NEURONTIN  Take 600 mg by mouth 3 (three) times daily.   linaclotide  145 MCG Caps capsule Commonly known as: Linzess  Take 1 capsule (145 mcg total) by mouth daily before breakfast.   lisinopril  2.5 MG  tablet Commonly known as: ZESTRIL  Take 2.5 mg by mouth as needed (When BP elevated).   metFORMIN  500 MG tablet Commonly known as: GLUCOPHAGE  Take 500 mg by mouth 2 (two) times daily with a meal.   methocarbamol  500 MG tablet Commonly known as: ROBAXIN  Take 1 tablet (500 mg total) by mouth 3 (three) times daily as needed for muscle spasms.   Mounjaro 5 MG/0.5ML Pen Generic drug: tirzepatide Inject 5 mg into the skin once a week.   Mounjaro 2.5 MG/0.5ML Pen Generic  drug: tirzepatide Inject 2.5 mg into the skin once a week.   ondansetron  4 MG disintegrating tablet Commonly known as: ZOFRAN -ODT Take 1 tablet (4 mg total) by mouth every 8 (eight) hours as needed for nausea or vomiting.   oxyCODONE -acetaminophen  5-325 MG tablet Commonly known as: PERCOCET/ROXICET Take 1 tablet by mouth every 4 (four) hours as needed for moderate pain. What changed: when to take this   Slow Release Iron  160 (50 Fe) MG Tbcr SR tablet Generic drug: ferrous sulfate  Take 1 tablet (160 mg total) by mouth daily.         ROS:  A comprehensive review of systems was negative except for: Integument/breast: positive for left breast draining tracks, indurated under nipple   Blood pressure (!) 166/104, pulse 97, temperature 98.4 F (36.9 C), temperature source Oral, resp. rate 14, height 5\' 5"  (1.651 m), weight 266 lb (120.7 kg), SpO2 95%. Physical Exam Vitals reviewed.  HENT:     Head: Normocephalic.     Mouth/Throat:     Mouth: Mucous membranes are moist.  Eyes:     Extraocular Movements: Extraocular movements intact.  Cardiovascular:     Rate and Rhythm: Normal rate and regular rhythm.  Pulmonary:     Effort: Pulmonary effort is normal.     Breath sounds: Normal breath sounds.  Chest:     Comments: Left breast with continued improved induration under nipple, nipple slightly retracted, medial and lateral drain sites with granulation but over 2cm deep each, drainage reported last week, no skin  cellulitis  Abdominal:     General: There is no distension.     Palpations: Abdomen is soft.     Tenderness: There is no abdominal tenderness.  Musculoskeletal:        General: Normal range of motion.  Skin:    General: Skin is warm.  Neurological:     General: No focal deficit present.     Mental Status: She is alert.  Psychiatric:        Mood and Affect: Mood normal.        Thought Content: Thought content normal.     Results:   Assessment and Plan:  ALLAYNA ERLICH is a 38 y.o. 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 I think am going to have to go back and remove more tissue retro-areola. We have discussed that the vaping is still likely causing issues with her breast. Discussed risk of bleeding, infection, cosmetic changes, potential loss of nipple, potential recurrence of the infection/abscess. Discussed finding something unexpected in the tissue but her mammogram was not suspicious and prior cavity pathology was benign.   OR for partial mastectomy of the left breast, will try to salvage the nipple area but may have to move nipple like a reduction mammoplasty to get this accomplished. Discussed that this may not work and that cosmetically the results may not be what she wants. She is just frustrated with the infection and wound care and wants to be done.   All questions were answered to the satisfaction of the patient.   Awilda Bogus 01/06/2024, 10:45 AM

## 2024-01-09 NOTE — H&P (Signed)
 [facility] History and Physical   Recurrent left breast abscess      Chief Complaint   Follow-up        Patricia Stanton is a 38 y.o. female.  HPI: Patricia Stanton is well known to me after having a large left breast abscess that I attempted incision and drainage on and drain placement in October 2024. She has been healing slowly and getting periodic infections in the two areas where the drains were placed. She has required multiple rounds of antibiotic and the areas continue to drain at times, further delaying the healing. She continues to vape but has cut back on smoking. She is frustrated. We are now over 6 months out.          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 (HCC) 03/25/2019   Mood disorder (HCC)     Obesity     Ovarian cyst     Tobacco abuse                 Past Surgical History:  Procedure Laterality Date   BIOPSY   10/22/2023    Procedure: BIOPSY;  Surgeon: Suzette Espy, MD;  [facility];  Service: Endoscopy;;   BREAST BIOPSY Left 06/17/2023    US  LT BREAST BX W LOC DEV 1ST LESION IMG BX SPEC US  GUIDE 06/17/2023 [facility]   BREAST BIOPSY Left 06/17/2023    US  LT BREAST BX W LOC DEV EA ADD LESION IMG BX SPEC US  GUIDE 06/17/2023 [facility]   BREAST BIOPSY Left 06/17/2023    US  LT BREAST BX W LOC DEV EA ADD LESION IMG BX SPEC US  GUIDE 06/17/2023 [facility]   BREAST BIOPSY Left 07/11/2023    Procedure: LEFT BREAST BIOPSY;  Surgeon: Awilda Bogus, MD;  Location: [facility];  Service: General;  Laterality: Left;   CESAREAN SECTION   2008    MMH    CHOLECYSTECTOMY N/A 11/25/2014    Procedure: LAPAROSCOPIC CHOLECYSTECTOMY;  Surgeon: Alanda Allegra Md, MD;  Location: [facility];  Service: General;  Laterality: N/A;   COLONOSCOPY WITH PROPOFOL  N/A 10/22/2023    Procedure: COLONOSCOPY WITH PROPOFOL ;  Surgeon: Suzette Espy, MD;  [facility];  Service: Endoscopy;  Laterality: N/A;  900am, asa 3   ESOPHAGOGASTRODUODENOSCOPY N/A 12/21/2015    Dr. Riley Cheadle: normal esophagus, non-bleeding erosive gastropathy, normal second portion of duodenum. Reactive gastropathy/chemical gastritis, negative H.pylori   ESOPHAGOGASTRODUODENOSCOPY (EGD) WITH PROPOFOL  N/A 11/23/2020     Surgeon: Suzette Espy, MD; normal esophagus, small hiatal hernia, normal examined duodenum.   ESOPHAGOGASTRODUODENOSCOPY (EGD) WITH PROPOFOL  N/A 10/22/2023    Procedure: ESOPHAGOGASTRODUODENOSCOPY (EGD) WITH PROPOFOL ;  Surgeon: Suzette Espy, MD;  [facility];  Service: Endoscopy;  Laterality: N/A;   FEMUR FRACTURE SURGERY Left 1995    tree fell on her   HERNIA REPAIR       INCISION AND DRAINAGE ABSCESS Left 07/11/2023    Procedure: INCISION AND DRAINAGE ABSCESS, BREAST;  Surgeon: Awilda Bogus, MD;  Location: [facility];  Service: General;  Laterality: Left;   INCISIONAL HERNIA REPAIR N/A 01/29/2016    Procedure: [name] HERNIORRHAPHY WITH MESH;  Surgeon: Alanda Allegra, MD;  Location: [facility];  Service: General;  Laterality: N/A;   INSERTION OF MESH   01/29/2016    Procedure: INSERTION OF MESH;  Surgeon: Alanda Allegra, MD;  Location: [facility];  Service: General;;   KNEE ARTHROSCOPY Left     LUMBAR LAMINECTOMY/DECOMPRESSION MICRODISCECTOMY Right 05/21/2019    Procedure: Microdiscectomy - Lumbar four-Lumbar five - right;  Surgeon: Isadora Mar, MD;  Location: [facility] OR;  Service: Neurosurgery;  Laterality: Right;   TUBAL LIGATION                   Family History  Problem Relation Age of Onset   Hypertension Mother     Hyperlipidemia Mother     Fibromyalgia  Mother     Other Mother          degenerative disc disease   Arthritis Mother     Kidney disease Mother     Diabetes Father     Hypertension Father     Hyperlipidemia Father     Heart disease Father 19   Heart attack Father     Stroke Father     Hyperlipidemia Brother     Hypertension Brother     Aneurysm Maternal Grandmother          AAA   Kidney disease Maternal Grandmother     Kidney disease Maternal Grandfather     Aneurysm Paternal Grandmother          AAA   Kidney disease Paternal Grandmother     Kidney disease Paternal Grandfather     Colon cancer Neg Hx     Inflammatory bowel disease Neg Hx            Social History  Social History         Tobacco Use   Smoking status: Every Day      Current packs/day: 0.50      Average packs/day: 0.5 packs/day for 20.3 years (10.2 ttl pk-yrs)      Types: Cigarettes      Start date: 09/17/2003      Passive exposure: Never   Smokeless tobacco: Never  Vaping Use   Vaping status: Never Used  Substance Use Topics   Alcohol use: No   Drug use: No        Medications: I have reviewed the patient's current medications. Allergies as of 01/06/2024         Reactions    Fentanyl  Other (See Comments)    Patient does not wish to ever receive fentanyl  again.  She stated this makes her extremely irritable to others around her for approximately 24 hours.      Tramadol  Other (See Comments)    Exacerbates her asthma     Nsaids Other (See Comments)    Unknown     Vicks Nyquil Cold & Flu Night [dm-apap-cpm] Hives, Other (See Comments)    Fever blisters around mouth            Medication List           Accurate as of January 06, 2024 10:45 AM. If you have any questions, ask your nurse or doctor.              Bismuth /Metronidaz/Tetracyclin 140-125-125 MG Caps Commonly known as: Pylera Take 3 capsules  by mouth 4 (four) times daily for 10 days.    gabapentin  600 MG tablet Commonly known as: NEURONTIN  Take 600 mg by mouth 3  (three) times daily.    linaclotide  145 MCG Caps capsule Commonly known as: Linzess  Take 1 capsule (145 mcg total) by mouth daily before breakfast.    lisinopril  2.5 MG tablet Commonly known as: ZESTRIL  Take 2.5 mg by mouth as needed (When BP elevated).    metFORMIN  500 MG tablet Commonly known as: GLUCOPHAGE  Take 500 mg by mouth 2 (two) times daily with a meal.    methocarbamol  500 MG tablet Commonly known as: ROBAXIN  Take 1 tablet (500 mg total) by mouth 3 (three) times daily as needed for muscle spasms.    Mounjaro 5 MG/0.5ML Pen Generic drug: tirzepatide Inject 5 mg into the skin once a week.    Mounjaro 2.5 MG/0.5ML Pen Generic drug: tirzepatide Inject 2.5 mg into the skin once a week.    ondansetron  4 MG disintegrating tablet Commonly known as: ZOFRAN -ODT Take 1 tablet (4 mg total) by mouth every 8 (eight) hours as needed for nausea or vomiting.    oxyCODONE -acetaminophen  5-325 MG tablet Commonly known as: PERCOCET/ROXICET Take 1 tablet by mouth every 4 (four) hours as needed for moderate pain. What changed: when to take this    Slow Release Iron  160 (50 Fe) MG Tbcr SR tablet Generic drug: ferrous sulfate  Take 1 tablet (160 mg total) by mouth daily.               ROS:  A comprehensive review of systems was negative except for: Integument/breast: positive for left breast draining tracks, indurated under nipple    Blood pressure (!) 166/104, pulse 97, temperature 98.4 F (36.9 C), temperature source Oral, resp. rate 14, height 5\' 5"  (1.651 m), weight 266 lb (120.7 kg), SpO2 95%. Physical Exam Vitals reviewed.  HENT:     Head: Normocephalic.     Mouth/Throat:     Mouth: Mucous membranes are moist.  Eyes:     Extraocular Movements: Extraocular movements intact.  Cardiovascular:     Rate and Rhythm: Normal rate and regular rhythm.  Pulmonary:     Effort: Pulmonary effort is normal.     Breath sounds: Normal breath sounds.  Chest:     Comments: Left  breast with continued improved induration under nipple, nipple slightly retracted, medial and lateral drain sites with granulation but over 2cm deep each, drainage reported last week, no skin cellulitis  Abdominal:     General: There is no distension.     Palpations: Abdomen is soft.     Tenderness: There is no abdominal tenderness.  Musculoskeletal:        General: Normal range of motion.  Skin:    General: Skin is warm.  Neurological:     General: No focal deficit present.     Mental Status: She is alert.  Psychiatric:        Mood and Affect: Mood normal.        Thought Content: Thought content normal.        Results:     Assessment and Plan:   CATLYNN GRONDAHL is a 38 y.o. 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 I think am going to have to go back and remove more tissue retro-areola. We have discussed that the vaping is still likely causing issues with her breast. Discussed risk of bleeding, infection,  cosmetic changes, potential loss of nipple, potential recurrence of the infection/abscess. Discussed finding something unexpected in the tissue but her mammogram was not suspicious and prior cavity pathology was benign.    OR for partial mastectomy of the left breast, will try to salvage the nipple area but may have to move nipple like a reduction mammoplasty to get this accomplished. Discussed that this may not work and that cosmetically the results may not be what she wants. She is just frustrated with the infection and wound care and wants to be done.    All questions were answered to the satisfaction of the patient.     Awilda Bogus 01/06/2024, 10:45 AM

## 2024-01-19 NOTE — Patient Instructions (Signed)
 Patricia Stanton  01/19/2024     @PREFPERIOPPHARMACY @   Your procedure is scheduled on  01/22/2024.   Report to [facility] at  0600  A.M.   Call this number if you have problems the morning of surgery:  [phone]  If you experience any cold or flu symptoms such as cough, fever, chills, shortness of breath, etc. between now and your scheduled surgery, please notify us  at the above number.   Remember:         Your last dose of mounjaro should have been on 01/14/2024.         DO NOT take any medications for diabetes the morning of your procedure.          Use your nebulizer and your inhaler before you come and bring your rescue inhaler with you.   Do not eat after midnight.   You may drink clear liquids until  0330 am on 01/22/2024.    Clear liquids allowed are:                    Water , Juice (No red color; non-citric and without pulp; diabetics please choose diet or no sugar options), Carbonated beverages (diabetics please choose diet or no sugar options), Clear Tea (No creamer, milk, or cream, including half & half and powdered creamer), Black Coffee Only (No creamer, milk or cream, including half & half and powdered creamer), and Clear Sports drink (No red color; diabetics please choose diet or no sugar options)    Take these medicines the morning of surgery with A SIP OF WATER                       gabapentin , methocarbamol  (if needed).    Do not wear jewelry, make-up or nail polish, including gel polish,  artificial nails, or any other type of covering on natural nails (fingers and  toes).  Do not wear lotions, powders, or perfumes, or deodorant.  Do not shave 48 hours prior to surgery.  Men may shave face and neck.  Do not bring valuables to the hospital.  [facility] is not responsible for any belongings or valuables.  Contacts, dentures or bridgework may not be worn into surgery.  Leave your suitcase in the car.  After surgery it may be brought to your  room.  For patients admitted to the hospital, discharge time will be determined by your treatment team.  Patients discharged the day of surgery will not be allowed to drive home and must have someone with them for 24 hours.    Special instructions:   DO NOT smoke tobacco or vape for 24 hours before your procedure.  Please read over the following fact sheets that you were given. Anesthesia Post-op Instructions and Care and Recovery After Surgery        Lumpectomy, Care After The following information offers guidance on how to care for yourself after your procedure. Your health care provider may also give you more specific instructions. If you have problems or questions, contact your health care provider. What can I expect after the procedure? After the procedure, it is common to have: Some pain or redness at the incision site. Breast swelling. Breast tenderness. Stiffness in your arm or shoulder. A change in the shape and feel of your breast. Scar tissue that feels hard to the touch in the area where the lump was removed. Follow these instructions at home: Medicines Take  over-the-counter and prescription medicines only as told by your health care provider. If you were prescribed an antibiotic, take it as told by your health care provider. Do not stop taking the antibiotic even if you start to feel better. Ask your health care provider if the medicine prescribed to you: Requires you to avoid driving or using machinery. Can cause constipation. You may need to take these actions to prevent or treat constipation: Drink enough fluid to keep your urine pale yellow. Take over-the-counter or prescription medicines. Eat foods that are high in fiber, such as beans, whole grains, and fresh fruits and vegetables. Limit foods that are high in fat and processed sugars, such as fried or sweet foods. Incision care     Follow instructions from your health care provider about how to take care  of your incision. Make sure you: Wash your hands with soap and water  for at least 20 seconds before and after you change your bandage (dressing). If soap and water  are not available, use hand sanitizer. Change your dressing as told by your health care provider. Leave stitches (sutures), skin glue, or adhesive strips in place. These skin closures may need to stay in place for 2 weeks or longer. If adhesive strip edges start to loosen and curl up, you may trim the loose edges. Do not remove adhesive strips completely unless your health care provider tells you to do that. Check your incision area every day for signs of infection. Check for: More redness, swelling, or pain. Fluid or blood. Warmth. Pus or a bad smell. Keep your dressing clean and dry. If you were sent home with a surgical drain in place, follow instructions from your health care provider about emptying it. Bathing Do not take baths, swim, or use a hot tub until your health care provider approves. Ask your health care provider if you may take showers. You may only be allowed to take sponge baths. Activity Rest as told by your health care provider. Do not sit for a long time without moving. Get up to take short walks every 1-2 hours. This will improve blood flow and breathing. Ask for help if you feel weak or unsteady. Be careful to avoid any activities that could cause an injury to your arm on the side of your surgery. Do not lift anything that is heavier than 10 lb (4.5 kg), or the limit that you are told, until your health care provider says that it is safe. Avoid lifting with the arm that is on the side of your surgery. Do not carry heavy objects on your shoulder on the side of your surgery. Do exercises to keep your shoulder and arm from getting stiff and swollen. Talk with your health care provider about which exercises are safe for you. Return to your normal activities as told by your health care provider. Ask your health care  provider what activities are safe for you. General instructions Wear a supportive bra as told by your health care provider. Raise (elevate) your arm above the level of your heart while you are sitting or lying down. Do not wear tight jewelry on your arm, wrist, or fingers on the side of your surgery. Wear compression stockings as told by your health care provider. These stockings help to prevent blood clots and reduce swelling in your legs. If you had any lymph nodes removed during your procedure, be sure to tell all of your health care providers. It is important to share this information before you have  certain procedures, such as blood tests or blood pressure measurements. Keep all follow-up visits. You may need to be screened for extra fluid around the lymph nodes and swelling in the breast and arm (lymphedema). Contact a health care provider if: You develop a rash. You have a fever. Your pain worsens or pain medicine is not working. You have swelling, weakness, or numbness in your arm that does not improve after a few weeks. You have new swelling in your breast. You have any of these signs of infection: More redness, swelling, or pain in your incision area. Fluid or blood coming from your incision. Warmth coming from the incision area. Pus or a bad smell coming from your incision. Get help right away if: You have very bad pain in your breast or arm. You have swelling in your legs or arms. You have redness, warmth, or pain in your leg or arm. You have chest pain. You have difficulty breathing. These symptoms may be an emergency. Get help right away. Call 911. Do not wait to see if the symptoms will go away. Do not drive yourself to the hospital. Summary After the procedure, it is common to have breast tenderness, swelling in your breast, and stiffness in your arm and shoulder. Follow instructions from your health care provider about how to take care of your incision. Do not lift  anything that is heavier than 10 lb (4.5 kg), or the limit that you are told, until your health care provider says that it is safe. Avoid lifting with the arm that is on the side of your surgery. If you had any lymph nodes removed during your procedure, be sure to tell all of your health care providers. This information is not intended to replace advice given to you by your health care provider. Make sure you discuss any questions you have with your health care provider. Document Revised: 11/11/2021 Document Reviewed: 11/11/2021 Elsevier Patient Education  2024 [employer].General Anesthesia, Adult, Care After The following information offers guidance on how to care for yourself after your procedure. Your health care provider may also give you more specific instructions. If you have problems or questions, contact your health care provider. What can I expect after the procedure? After the procedure, it is common for people to: Have pain or discomfort at the IV site. Have nausea or vomiting. Have a sore throat or hoarseness. Have trouble concentrating. Feel cold or chills. Feel weak, sleepy, or tired (fatigue). Have soreness and body aches. These can affect parts of the body that were not involved in surgery. Follow these instructions at home: For the time period you were told by your health care provider:  Rest. Do not participate in activities where you could fall or become injured. Do not drive or use machinery. Do not drink alcohol. Do not take sleeping pills or medicines that cause drowsiness. Do not make important decisions or sign legal documents. Do not take care of children on your own. General instructions Drink enough fluid to keep your urine pale yellow. If you have sleep apnea, surgery and certain medicines can increase your risk for breathing problems. Follow instructions from your health care provider about wearing your sleep device: Anytime you are sleeping, including  during daytime naps. While taking prescription pain medicines, sleeping medicines, or medicines that make you drowsy. Return to your normal activities as told by your health care provider. Ask your health care provider what activities are safe for you. Take over-the-counter and prescription medicines only as  told by your health care provider. Do not use any products that contain nicotine  or tobacco. These products include cigarettes, chewing tobacco, and vaping devices, such as e-cigarettes. These can delay incision healing after surgery. If you need help quitting, ask your health care provider. Contact a health care provider if: You have nausea or vomiting that does not get better with medicine. You vomit every time you eat or drink. You have pain that does not get better with medicine. You cannot urinate or have bloody urine. You develop a skin rash. You have a fever. Get help right away if: You have trouble breathing. You have chest pain. You vomit blood. These symptoms may be an emergency. Get help right away. Call 911. Do not wait to see if the symptoms will go away. Do not drive yourself to the hospital. Summary After the procedure, it is common to have a sore throat, hoarseness, nausea, vomiting, or to feel weak, sleepy, or fatigue. For the time period you were told by your health care provider, do not drive or use machinery. Get help right away if you have difficulty breathing, have chest pain, or vomit blood. These symptoms may be an emergency. This information is not intended to replace advice given to you by your health care provider. Make sure you discuss any questions you have with your health care provider. Document Revised: 11/30/2021 Document Reviewed: 11/30/2021 Elsevier Patient Education  2024 Elsevier Inc.How to Use Chlorhexidine  at Home in the Shower Chlorhexidine  gluconate (CHG) is a germ-killing (antiseptic) wash that's used to clean the skin. It can get rid of the  germs that normally live on the skin and can keep them away for about 24 hours. If you're having surgery, you may be told to shower with CHG at home the night before surgery. This can help lower your risk for infection. To use CHG wash in the shower, follow the steps below. Supplies needed: CHG body wash. Clean washcloth. Clean towel. How to use CHG in the shower Follow these steps unless you're told to use CHG in a different way: Start the shower. Use your normal soap and shampoo to wash your face and hair. Turn off the shower or move out of the shower stream. Pour CHG onto a clean washcloth. Do not use any type of brush or rough sponge. Start at your neck, washing your body down to your toes. Make sure you: Wash the part of your body where the surgery will be done for at least 1 minute. Do not scrub. Do not use CHG on your head or face unless your health care provider tells you to. If it gets into your ears or eyes, rinse them well with water . Do not wash your genitals with CHG. Wash your back and under your arms. Make sure to wash skin folds. Let the CHG sit on your skin for 1-2 minutes or as long as told. Rinse your entire body in the shower, including all body creases and folds. Turn off the shower. Dry off with a clean towel. Do not put anything on your skin afterward, such as powder, lotion, or perfume. Put on clean clothes or pajamas. If it's the night before surgery, sleep in clean sheets. General tips Use CHG only as told, and follow the instructions on the label. Use the full amount of CHG as told. This is often one bottle. Do not smoke and stay away from flames after using CHG. Your skin may feel sticky after using CHG. This is normal.  The sticky feeling will go away as the CHG dries. Do not use CHG: If you have a chlorhexidine  allergy or have reacted to chlorhexidine  in the past. On open wounds or areas of skin that have broken skin, cuts, or scrapes. On babies younger  than 60 months of age. Contact a health care provider if: You have questions about using CHG. Your skin gets irritated or itchy. You have a rash after using CHG. You swallow any CHG. Call your local poison control center [phone] in the [place] Your eyes itch badly, or they become very red or swollen. Your hearing changes. You have trouble seeing. If you can't reach your provider, go to an urgent care or emergency room. Do not drive yourself. Get help right away if: You have swelling or tingling in your mouth or throat. You make high-pitched whistling sounds when you breathe, most often when you breathe out (wheeze). You have trouble breathing. These symptoms may be an emergency. Call 911 right away. Do not wait to see if the symptoms will go away. Do not drive yourself to the hospital. This information is not intended to replace advice given to you by your health care provider. Make sure you discuss any questions you have with your health care provider. Document Revised: 03/18/2023 Document Reviewed: 03/14/2022 Elsevier Patient Education  2024 [employer].

## 2024-01-20 ENCOUNTER — Encounter (HOSPITAL_COMMUNITY)
Admission: RE | Admit: 2024-01-20 | Discharge: 2024-01-20 | Disposition: A | Discharge status: Home / Self Care | Source: Ambulatory Visit | Attending: General Surgery | Admitting: General Surgery

## 2024-01-20 ENCOUNTER — Other Ambulatory Visit: Payer: Self-pay

## 2024-01-20 VITALS — BP 140/86 | HR 97 | Temp 98.4°F | Resp 16 | Ht 65.0 in | Wt 266.0 lb

## 2024-01-20 DIAGNOSIS — E1169 Type 2 diabetes mellitus with other specified complication: Secondary | ICD-10-CM | POA: Insufficient documentation

## 2024-01-20 DIAGNOSIS — Z01818 Encounter for other preprocedural examination: Secondary | ICD-10-CM | POA: Diagnosis present

## 2024-01-20 DIAGNOSIS — E669 Obesity, unspecified: Secondary | ICD-10-CM | POA: Insufficient documentation

## 2024-01-20 LAB — BASIC METABOLIC PANEL WITH GFR
Anion gap: 13 (ref 5–15)
BUN: 7 mg/dL (ref 6–20)
CO2: 20 mmol/L — ABNORMAL LOW (ref 22–32)
Calcium: 8.6 mg/dL — ABNORMAL LOW (ref 8.9–10.3)
Chloride: 101 mmol/L (ref 98–111)
Creatinine, Ser: 0.49 mg/dL (ref 0.44–1.00)
GFR, Estimated: >60 mL/min (ref >60)
Glucose, Bld: 301 mg/dL — ABNORMAL HIGH (ref 70–99)
Potassium: 4.3 mmol/L (ref 3.5–5.1)
Sodium: 134 mmol/L — ABNORMAL LOW (ref 135–145)

## 2024-01-20 LAB — POCT PREGNANCY, URINE: Preg Test, Ur: NEGATIVE

## 2024-01-20 LAB — HEMOGLOBIN A1C
Hgb A1c MFr Bld: 10.9 % — ABNORMAL HIGH (ref 4.8–5.6)
Mean Plasma Glucose: 266.13 mg/dL

## 2024-01-21 NOTE — OR Nursing (Signed)
 Glucose result from PAT visit  301. Reported to Dr Collene Dawson and Dr. Margrette Shield.

## 2024-01-22 ENCOUNTER — Ambulatory Visit (HOSPITAL_COMMUNITY)
Admission: RE | Admit: 2024-01-22 | Discharge: 2024-01-22 | Disposition: A | Discharge status: Home / Self Care | Attending: General Surgery | Admitting: General Surgery

## 2024-01-22 ENCOUNTER — Ambulatory Visit (HOSPITAL_BASED_OUTPATIENT_CLINIC_OR_DEPARTMENT_OTHER): Admitting: Certified Registered"

## 2024-01-22 ENCOUNTER — Encounter: Payer: Self-pay | Admitting: *Deleted

## 2024-01-22 ENCOUNTER — Encounter (HOSPITAL_COMMUNITY)
Admission: RE | Disposition: A | Discharge status: Home / Self Care | Payer: Self-pay | Source: Home / Self Care | Attending: General Surgery

## 2024-01-22 ENCOUNTER — Encounter (HOSPITAL_COMMUNITY): Payer: Self-pay | Admitting: General Surgery

## 2024-01-22 ENCOUNTER — Ambulatory Visit (HOSPITAL_COMMUNITY): Admitting: Certified Registered"

## 2024-01-22 DIAGNOSIS — Z7984 Long term (current) use of oral hypoglycemic drugs: Secondary | ICD-10-CM | POA: Diagnosis not present

## 2024-01-22 DIAGNOSIS — N611 Abscess of the breast and nipple: Secondary | ICD-10-CM

## 2024-01-22 DIAGNOSIS — Z8249 Family history of ischemic heart disease and other diseases of the circulatory system: Secondary | ICD-10-CM | POA: Insufficient documentation

## 2024-01-22 DIAGNOSIS — L720 Epidermal cyst: Secondary | ICD-10-CM | POA: Diagnosis not present

## 2024-01-22 DIAGNOSIS — J45909 Unspecified asthma, uncomplicated: Secondary | ICD-10-CM

## 2024-01-22 DIAGNOSIS — F17210 Nicotine dependence, cigarettes, uncomplicated: Secondary | ICD-10-CM

## 2024-01-22 DIAGNOSIS — Z833 Family history of diabetes mellitus: Secondary | ICD-10-CM | POA: Insufficient documentation

## 2024-01-22 DIAGNOSIS — G5600 Carpal tunnel syndrome, unspecified upper limb: Secondary | ICD-10-CM | POA: Diagnosis present

## 2024-01-22 DIAGNOSIS — G709 Myoneural disorder, unspecified: Secondary | ICD-10-CM | POA: Diagnosis not present

## 2024-01-22 DIAGNOSIS — I10 Essential (primary) hypertension: Secondary | ICD-10-CM | POA: Diagnosis not present

## 2024-01-22 DIAGNOSIS — E1122 Type 2 diabetes mellitus with diabetic chronic kidney disease: Secondary | ICD-10-CM | POA: Insufficient documentation

## 2024-01-22 DIAGNOSIS — E119 Type 2 diabetes mellitus without complications: Secondary | ICD-10-CM | POA: Diagnosis not present

## 2024-01-22 DIAGNOSIS — N182 Chronic kidney disease, stage 2 (mild): Secondary | ICD-10-CM | POA: Diagnosis not present

## 2024-01-22 DIAGNOSIS — I129 Hypertensive chronic kidney disease with stage 1 through stage 4 chronic kidney disease, or unspecified chronic kidney disease: Secondary | ICD-10-CM | POA: Diagnosis not present

## 2024-01-22 DIAGNOSIS — K219 Gastro-esophageal reflux disease without esophagitis: Secondary | ICD-10-CM | POA: Diagnosis not present

## 2024-01-22 DIAGNOSIS — D242 Benign neoplasm of left breast: Secondary | ICD-10-CM | POA: Diagnosis not present

## 2024-01-22 DIAGNOSIS — Z7985 Long-term (current) use of injectable non-insulin antidiabetic drugs: Secondary | ICD-10-CM | POA: Insufficient documentation

## 2024-01-22 HISTORY — PX: BREAST LUMPECTOMY: SHX2

## 2024-01-22 LAB — GLUCOSE, CAPILLARY: Glucose-Capillary: 302 mg/dL — ABNORMAL HIGH (ref 70–99)

## 2024-01-22 SURGERY — BREAST LUMPECTOMY
Anesthesia: General | Site: Breast | Laterality: Left

## 2024-01-22 MED ORDER — LACTATED RINGERS IV SOLN: INTRAVENOUS | Status: DC

## 2024-01-22 MED ORDER — OXYCODONE HCL 5 MG/5ML PO SOLN: 5.0000 mg | Freq: Once | ORAL | Status: DC | PRN

## 2024-01-22 MED ORDER — MIDAZOLAM HCL 2 MG/2ML IJ SOLN
INTRAMUSCULAR | Status: AC
  Filled 2024-01-22: qty 2

## 2024-01-22 MED ORDER — ONDANSETRON HCL 4 MG/2ML IJ SOLN
INTRAMUSCULAR | Status: AC
  Filled 2024-01-22: qty 2

## 2024-01-22 MED ORDER — HYDROMORPHONE HCL 1 MG/ML IJ SOLN
0.2500 mg | INTRAMUSCULAR | Status: DC | PRN
Start: 2024-01-22 — End: 2024-01-22

## 2024-01-22 MED ORDER — EPHEDRINE 5 MG/ML INJ
INTRAVENOUS | Status: AC
  Filled 2024-01-22: qty 5

## 2024-01-22 MED ORDER — OXYCODONE HCL 5 MG PO TABS: 5.0000 mg | ORAL_TABLET | Freq: Once | ORAL | Status: DC | PRN

## 2024-01-22 MED ORDER — HYDROMORPHONE HCL 1 MG/ML IJ SOLN
INTRAMUSCULAR | Status: DC | PRN
  Administered 2024-01-22 (×3): .5 mg via INTRAVENOUS

## 2024-01-22 MED ORDER — CHLORHEXIDINE GLUCONATE CLOTH 2 % EX PADS: 6.0000 | MEDICATED_PAD | Freq: Once | CUTANEOUS | Status: DC

## 2024-01-22 MED ORDER — LIDOCAINE 2% (20 MG/ML) 5 ML SYRINGE
INTRAMUSCULAR | Status: DC | PRN
Start: 2024-01-22 — End: 2024-01-22
  Administered 2024-01-22: 100 mg via INTRAVENOUS

## 2024-01-22 MED ORDER — PHENYLEPHRINE 80 MCG/ML (10ML) SYRINGE FOR IV PUSH (FOR BLOOD PRESSURE SUPPORT)
PREFILLED_SYRINGE | INTRAVENOUS | Status: AC
  Filled 2024-01-22: qty 10

## 2024-01-22 MED ORDER — PROPOFOL 10 MG/ML IV BOLUS
INTRAVENOUS | Status: AC
  Filled 2024-01-22: qty 20

## 2024-01-22 MED ORDER — ONDANSETRON HCL 4 MG/2ML IJ SOLN
INTRAMUSCULAR | Status: DC | PRN
Start: 2024-01-22 — End: 2024-01-22
  Administered 2024-01-22: 4 mg via INTRAVENOUS

## 2024-01-22 MED ORDER — MIDAZOLAM HCL 2 MG/2ML IJ SOLN
INTRAMUSCULAR | Status: DC | PRN
  Administered 2024-01-22: 2 mg via INTRAVENOUS

## 2024-01-22 MED ORDER — DEXMEDETOMIDINE HCL IN NACL 80 MCG/20ML IV SOLN
INTRAVENOUS | Status: DC | PRN
Start: 2024-01-22 — End: 2024-01-22
  Administered 2024-01-22: 20 ug via INTRAVENOUS

## 2024-01-22 MED ORDER — BACITRACIN 500 UNIT/GM EX OINT
TOPICAL_OINTMENT | CUTANEOUS | Status: DC | PRN
  Administered 2024-01-22: 1 via TOPICAL

## 2024-01-22 MED ORDER — BUPIVACAINE HCL (PF) 0.5 % IJ SOLN
INTRAMUSCULAR | Status: DC | PRN
  Administered 2024-01-22: 30 mL

## 2024-01-22 MED ORDER — HYDROMORPHONE HCL 1 MG/ML IJ SOLN
INTRAMUSCULAR | Status: AC
  Filled 2024-01-22: qty 1

## 2024-01-22 MED ORDER — CHLORHEXIDINE GLUCONATE 0.12 % MT SOLN
15.0000 mL | Freq: Once | OROMUCOSAL | Status: AC
  Administered 2024-01-22: 15 mL via OROMUCOSAL

## 2024-01-22 MED ORDER — SODIUM CHLORIDE (PF) 0.9 % IJ SOLN
INTRAMUSCULAR | Status: AC
  Filled 2024-01-22: qty 10

## 2024-01-22 MED ORDER — PROPOFOL 500 MG/50ML IV EMUL
INTRAVENOUS | Status: DC | PRN
  Administered 2024-01-22: 50 ug/kg/min via INTRAVENOUS

## 2024-01-22 MED ORDER — ONDANSETRON HCL 4 MG/2ML IJ SOLN: 4.0000 mg | Freq: Once | INTRAMUSCULAR | Status: DC | PRN

## 2024-01-22 MED ORDER — CIPROFLOXACIN HCL 500 MG PO TABS: 500.0000 mg | ORAL_TABLET | Freq: Two times a day (BID) | ORAL | 0 refills | Status: DC

## 2024-01-22 MED ORDER — VASOPRESSIN 20 UNIT/ML IV SOLN
INTRAVENOUS | Status: DC | PRN
  Administered 2024-01-22 (×2): 1 [IU] via INTRAVENOUS

## 2024-01-22 MED ORDER — VASOPRESSIN 20 UNIT/ML IV SOLN
INTRAVENOUS | Status: AC
  Filled 2024-01-22: qty 1

## 2024-01-22 MED ORDER — BACITRACIN ZINC 500 UNIT/GM EX OINT
TOPICAL_OINTMENT | CUTANEOUS | Status: AC
  Filled 2024-01-22: qty 0.9

## 2024-01-22 MED ORDER — LIDOCAINE 2% (20 MG/ML) 5 ML SYRINGE
INTRAMUSCULAR | Status: AC
  Filled 2024-01-22: qty 5

## 2024-01-22 MED ORDER — CIPROFLOXACIN IN D5W 400 MG/200ML IV SOLN
400.0000 mg | INTRAVENOUS | Status: AC
  Administered 2024-01-22: 400 mg via INTRAVENOUS
  Filled 2024-01-22: qty 200

## 2024-01-22 MED ORDER — BUPIVACAINE HCL (PF) 0.5 % IJ SOLN
INTRAMUSCULAR | Status: AC
  Filled 2024-01-22: qty 30

## 2024-01-22 MED ORDER — DEXMEDETOMIDINE HCL IN NACL 80 MCG/20ML IV SOLN
INTRAVENOUS | Status: AC
  Filled 2024-01-22: qty 20

## 2024-01-22 MED ORDER — INSULIN ASPART 100 UNIT/ML IJ SOLN
10.0000 [IU] | Freq: Once | INTRAMUSCULAR | Status: AC
  Administered 2024-01-22: 10 [IU] via SUBCUTANEOUS
  Filled 2024-01-22: qty 0.1

## 2024-01-22 MED ORDER — ORAL CARE MOUTH RINSE: 15.0000 mL | Freq: Once | OROMUCOSAL | Status: AC

## 2024-01-22 MED ORDER — CHLORHEXIDINE GLUCONATE CLOTH 2 % EX PADS
6.0000 | MEDICATED_PAD | Freq: Once | CUTANEOUS | Status: AC
  Administered 2024-01-22: 6 via TOPICAL

## 2024-01-22 MED ORDER — PHENYLEPHRINE 80 MCG/ML (10ML) SYRINGE FOR IV PUSH (FOR BLOOD PRESSURE SUPPORT)
PREFILLED_SYRINGE | INTRAVENOUS | Status: DC | PRN
Start: 2024-01-22 — End: 2024-01-22
  Administered 2024-01-22: 80 ug via INTRAVENOUS
  Administered 2024-01-22: 160 ug via INTRAVENOUS
  Administered 2024-01-22: 80 ug via INTRAVENOUS
  Administered 2024-01-22: 160 ug via INTRAVENOUS
  Administered 2024-01-22: 80 ug via INTRAVENOUS
  Administered 2024-01-22 (×3): 160 ug via INTRAVENOUS

## 2024-01-22 MED ORDER — PROPOFOL 500 MG/50ML IV EMUL
INTRAVENOUS | Status: AC
  Filled 2024-01-22: qty 50

## 2024-01-22 MED ORDER — 0.9 % SODIUM CHLORIDE (POUR BTL) OPTIME
TOPICAL | Status: DC | PRN
  Administered 2024-01-22: 1000 mL

## 2024-01-22 MED ORDER — HYDROMORPHONE HCL 1 MG/ML IJ SOLN
INTRAMUSCULAR | Status: AC
  Filled 2024-01-22: qty 0.5

## 2024-01-22 MED ORDER — OXYCODONE HCL 5 MG PO TABS: 5.0000 mg | ORAL_TABLET | ORAL | 0 refills | Status: DC | PRN

## 2024-01-22 MED ORDER — PROPOFOL 10 MG/ML IV BOLUS
INTRAVENOUS | Status: DC | PRN
  Administered 2024-01-22: 250 mg via INTRAVENOUS

## 2024-01-22 MED ORDER — EPHEDRINE SULFATE-NACL 50-0.9 MG/10ML-% IV SOSY
PREFILLED_SYRINGE | INTRAVENOUS | Status: DC | PRN
Start: 2024-01-22 — End: 2024-01-22
  Administered 2024-01-22: 10 mg via INTRAVENOUS

## 2024-01-22 SURGICAL SUPPLY — 34 items
CHLORAPREP W/TINT 26 (MISCELLANEOUS) ×1 IMPLANT
CLOTH BEACON ORANGE TIMEOUT ST (SAFETY) ×1 IMPLANT
COVER LIGHT HANDLE STERIS (MISCELLANEOUS) ×2 IMPLANT
DECANTER SPIKE VIAL GLASS SM (MISCELLANEOUS) ×1 IMPLANT
DERMABOND ADVANCED .7 DNX12 (GAUZE/BANDAGES/DRESSINGS) ×1 IMPLANT
ELECTRODE REM PT RTRN 9FT ADLT (ELECTROSURGICAL) ×1 IMPLANT
GAUZE SPONGE 4X4 12PLY STRL (GAUZE/BANDAGES/DRESSINGS) IMPLANT
GLOVE BIO SURGEON STRL SZ 6.5 (GLOVE) ×1 IMPLANT
GLOVE BIO SURGEON STRL SZ7 (GLOVE) IMPLANT
GLOVE BIOGEL M 6.5 STRL (GLOVE) IMPLANT
GLOVE BIOGEL PI IND STRL 6.5 (GLOVE) ×1 IMPLANT
GLOVE BIOGEL PI IND STRL 7.0 (GLOVE) ×2 IMPLANT
GLOVE BIOGEL PI IND STRL 7.5 (GLOVE) IMPLANT
GLOVE ECLIPSE 6.5 STRL STRAW (GLOVE) IMPLANT
GOWN STRL REUS W/TWL LRG LVL3 (GOWN DISPOSABLE) ×2 IMPLANT
KIT TURNOVER KIT A (KITS) ×1 IMPLANT
MANIFOLD NEPTUNE II (INSTRUMENTS) ×1 IMPLANT
NDL HYPO 25X1 1.5 SAFETY (NEEDLE) ×1 IMPLANT
NEEDLE HYPO 25X1 1.5 SAFETY (NEEDLE) ×1 IMPLANT
NS IRRIG 1000ML POUR BTL (IV SOLUTION) ×1 IMPLANT
PACK MINOR (CUSTOM PROCEDURE TRAY) ×1 IMPLANT
PAD ABD 5X9 TENDERSORB (GAUZE/BANDAGES/DRESSINGS) IMPLANT
PAD ARMBOARD POSITIONER FOAM (MISCELLANEOUS) ×1 IMPLANT
POSITIONER HEAD 8X9X4 ADT (SOFTGOODS) ×1 IMPLANT
SET BASIN LINEN APH (SET/KITS/TRAYS/PACK) ×1 IMPLANT
SPONGE T-LAP 18X18 ~~LOC~~+RFID (SPONGE) ×1 IMPLANT
SUT ETHILON 3 0 PS 1 (SUTURE) IMPLANT
SUT MNCRL AB 4-0 PS2 18 (SUTURE) ×1 IMPLANT
SUT SILK 2 0 SH (SUTURE) IMPLANT
SUT VIC AB 3-0 SH 27X BRD (SUTURE) ×1 IMPLANT
SWAB CULTURE ESWAB REG 1ML (MISCELLANEOUS) IMPLANT
SYR BULB IRRIG 60ML STRL (SYRINGE) ×1 IMPLANT
SYR CONTROL 10ML LL (SYRINGE) ×1 IMPLANT
TAPE CLOTH SOFT 2X10 (GAUZE/BANDAGES/DRESSINGS) IMPLANT

## 2024-01-22 NOTE — Interval H&P Note (Signed)
 History and Physical Interval Note:  01/22/2024 7:26 AM  Patricia Stanton  has presented today for surgery, with the diagnosis of RECURRENT BREAST ABSCESS.  The various methods of treatment have been discussed with the patient and family. After consideration of risks, benefits and other options for treatment, the patient has consented to  Procedure(s): BREAST LUMPECTOMY (Left) as a surgical intervention.  The patient's history has been reviewed, patient examined, no change in status, stable for surgery.  I have reviewed the patient's chart and labs.  Questions were answered to the patient's satisfaction.    Recurrent infections of the left breast. BS are elevated as patient did not realize she could take her metformin . Discussed that elevated BS increases risk of infection and not healing. Discussed risk of nipple loss.  Marked patient.   Awilda Bogus

## 2024-01-22 NOTE — Progress Notes (Signed)
 [facility]  Patient initially refused insulin  dose for operation. Discussed with her and fact that this will help get her BS down. Discussed that she can restart her Mounjaro  tomorrow.   Deena Farrier, MD [facility] [address]low [place], [place] [address] [phone] (office)

## 2024-01-22 NOTE — Discharge Instructions (Addendum)
 Discharge Instructions:  Restart your Metformin  and Mounjaro starting 01/23/24. You can drive after 24 hours and not taking pain medication.  Take the Cipro  500 mg twice daily for 5 days post operative.    Common Complaints: Pain at the incision site is common.  Some nausea is common and poor appetite are common after anesthesia.  Diet/ Activity: Diet as tolerated. You may not have an appetite, but it is important to stay hydrated. Drink 64 ounces of water  a day. Your appetite will return with time.  Leave the dressing in place for 48 hours. Remove on 01/24/2024. At that time you can start to shower and pat the area dry. After showering, place bacitracin  or neosporin on the incision and keep it covered for at least 7 days after surgery.  Shower per your regular routine daily.   Walk everyday for at least 15-20 minutes. Deep cough and move around every 1-2 hours in the first few days after surgery.  Limit excessive movement, lifting > 10 lbs, stretching with the limb if there is an incision on your arm/armpit or leg.   Wear a supportive bra.    Medication: Take tylenol  and ibuprofen  as needed for pain control, alternating every 4-6 hours.  Take your normal percocet, and you have 8 roxicodone  to help with breakthrough pain.  Example:  Tylenol  1000mg  @ 6am, 12noon, 6pm, (Do not exceed 4000mg  of tylenol  a day). Ibuprofen  800mg  @ 9am, 3pm, 9pm, 3am (Do not exceed 3600mg  of ibuprofen  a day).  Take Roxicodone  for breakthrough pain every 4 hours.  Take Colace for constipation related to narcotic pain medication. If you do not have a bowel movement in 2 days, take Miralax over the counter.  Drink plenty of water  to also prevent constipation.   Contact Information: If you have questions or concerns, please call our office, [phone], Monday- Thursday 8AM-5PM and Friday 8AM-12Noon.  If it is after hours or on the weekend, please call [facility] Main Number, [phone], [phone], and  ask to speak to the surgeon on call for Dr. Collene Dawson at [facility].

## 2024-01-22 NOTE — Progress Notes (Signed)
 Delay to OR for pt having multiple questions, refusing insulin  for blood sugar of 302.

## 2024-01-22 NOTE — Anesthesia Preprocedure Evaluation (Signed)
 Anesthesia Evaluation  Patient identified by MRN, date of birth, ID band Patient awake    Reviewed: Allergy & Precautions, H&P , NPO status , Patient's Chart, lab work & pertinent test results, reviewed documented beta blocker date and time   Airway Mallampati: II  TM Distance: >3 FB Neck ROM: full    Dental no notable dental hx.    Pulmonary neg pulmonary ROS, asthma , pneumonia, Current Smoker and Patient abstained from smoking.   Pulmonary exam normal breath sounds clear to auscultation       Cardiovascular Exercise Tolerance: Good hypertension, negative cardio ROS  Rhythm:regular Rate:Normal     Neuro/Psych  Headaches PSYCHIATRIC DISORDERS Anxiety Depression     Neuromuscular disease negative neurological ROS  negative psych ROS   GI/Hepatic negative GI ROS, Neg liver ROS,GERD  ,,  Endo/Other  negative endocrine ROSdiabetes    Renal/GU Renal diseasenegative Renal ROS  negative genitourinary   Musculoskeletal   Abdominal   Peds  Hematology negative hematology ROS (+)   Anesthesia Other Findings   Reproductive/Obstetrics negative OB ROS                             Anesthesia Physical Anesthesia Plan  ASA: 3  Anesthesia Plan: General   Post-op Pain Management:    Induction:   PONV Risk Score and Plan: Propofol  infusion  Airway Management Planned:   Additional Equipment:   Intra-op Plan:   Post-operative Plan:   Informed Consent: I have reviewed the patients History and Physical, chart, labs and discussed the procedure including the risks, benefits and alternatives for the proposed anesthesia with the patient or authorized representative who has indicated his/her understanding and acceptance.     Dental Advisory Given  Plan Discussed with: CRNA  Anesthesia Plan Comments:        Anesthesia Quick Evaluation

## 2024-01-22 NOTE — Anesthesia Procedure Notes (Signed)
 Procedure Name: LMA Insertion Date/Time: 01/22/2024 7:52 AM  Performed by: Juluis Ok, CRNAPre-anesthesia Checklist: Patient identified, Emergency Drugs available, Suction available and Patient being monitored Patient Re-evaluated:Patient Re-evaluated prior to induction Oxygen Delivery Method: Circle system utilized Preoxygenation: Pre-oxygenation with 100% oxygen Induction Type: IV induction Ventilation: Mask ventilation without difficulty LMA: LMA inserted LMA Size: 4.0 Number of attempts: 1 Placement Confirmation: positive ETCO2, CO2 detector and breath sounds checked- equal and bilateral Tube secured with: Tape Dental Injury: Teeth and Oropharynx as per pre-operative assessment  Comments: Atraumatic insertion of LMA size #4. Lips and teeth remain in preoperative condition.

## 2024-01-22 NOTE — Transfer of Care (Signed)
 Immediate Anesthesia Transfer of Care Note  Patient: Patricia Stanton  Procedure(s) Performed: BREAST LUMPECTOMY (Left: Breast)  Patient Location: PACU  Anesthesia Type:General  Level of Consciousness: drowsy and patient cooperative  Airway & Oxygen Therapy: Patient Spontanous Breathing and Patient connected to face mask oxygen  Post-op Assessment: Report given to RN and Post -op Vital signs reviewed and stable  Post vital signs: Reviewed and stable  Last Vitals:  Vitals Value Taken Time  BP 107/55 01/22/24 0930  Temp 36.3 C 01/22/24 0926  Pulse 84 01/22/24 0932  Resp 13 01/22/24 0932  SpO2 98 % 01/22/24 0932  Vitals shown include unfiled device data.  Last Pain:  Vitals:   01/22/24 0650  TempSrc: Oral  PainSc: 0-No pain      Patients Stated Pain Goal: 5 (01/22/24 0650)  Complications: No notable events documented.

## 2024-01-22 NOTE — Progress Notes (Signed)
 [facility]  Breast abscess excision. Nipple areolar complex preserved. Will send home on Cipro  500 mg BID for 5 days to try to eliminate any chance of recurrence of infection. The last culture was sensitive to cipro .   Roxicodone  5 mg q 4 PRN # 8 sent in. She is on percocet at baseline and gets this from Forest Idol NP. My office will notify them of  this one time prescription.  Future Appointments  Date Time Provider Department Center  02/04/2024 11:00 AM Awilda Bogus, MD [facility] None     Deena Farrier, MD [facility] [address] [place], [place] [address] [phone] (office)

## 2024-01-22 NOTE — Op Note (Addendum)
 [facility] Operative Note  01/22/24  Preoperative Diagnosis: Left breast recurrent abscess    Postoperative Diagnosis: Same   Procedure(s) Performed: Left breast partial mastectomy excision of abscess cavity in retro-areolar space    Surgeon: Dixon Fredrickson. Collene Dawson, MD   Assistants: No qualified resident was available    Anesthesia: General   Anesthesiologist:  Dr. Margrette Shield, MD    Specimens: Left breast tissue / abscess cavity (retro-areola) suture superior ; culture of tissue    Estimated Blood Loss: Minimal   Blood Replacement: None    Complications: None   Wound Class:Dirty infected    Operative Indications: Ms. Maphis is a 38 yo with diabetes who smokes and continues to have left breast abscess and slow healing sinus tracks from a prior I&D and drain placement in October 2024. She has not healed the area and has had recurrent infections requiring antibiotics. We discussed partial mastectomy /excision of the area and abscess cavity and risk of bleeding, recurrent infection, issues with healing, cosmetic changes, loss of nipple areolar complex. She has had elevated BS because unfortunately she stayed off her Mounjaro and metformin . I discussed with her that this increases risk of infection and can delay wound healing. Given that this is for a recurrent infection, I do not think we need to postpone the surgery and she understands the risk.    Findings: Indurated tissue with sinus tracks from the skin medial and lateral   Procedure: The patient was taken to the operating room and placed supine. General anesthesia was induced. Intravenous antibiotics were  administered per protocol.  The left breast was prepped and draped in the usual sterile fashion.   The patient had two tracks around the nipple areola complex (one medial and one lateral). I started by making a circumareolar incision and making a crescent shape incision on the superior portion of the nipple areolar  complex to include both of these tracks.  This was extended down to the breast tissue. Skin flaps were made superior. I then extended the dissection down around the indurated inflamed tissue, getting back to healthy breast tissue. Much of this excision was in the retro-areolar space. I did leave tissue under the nipple areolar complex to have adequate blood supply. There was a track going up to the nipple with some caseous material in it, and this was divided, and incorporated with the specimen. I continued to excise the entire abscess cavity and indurated tissue. I then marked it suture superior. I obtained culture in the area of the caseous material and the sinus tracks.  The team changed gloves.  The cavity was irrigated. Marcaine  was injected. The nipple areolar complex appeared pink and healthy.  I then proceeded to close down the dead space of the cavity because of her recurrence infections, I did not want her to form a seroma cavity that could get infected.  I did this was 3-0 Vicryl interrupted sutures.  In doing this, I took care to help create the best cosmetic appearance I could.  I moved the nipple to the medial superior aspect of the crescent incision, and re-approximated the flaps with 3-0 Vicryl interrupted. On the lateral aspect of the crescent incision there as a dogear forming, and to fix this and get a cosmetically pleasing closure, I cut off the dog ear in a triangular fashion and elevated skin flaps. This resulted in a linear closure of the breast tissue off the lateral aspect of the breast. The deep cavity and skin flaps were  re-approximated with 3-0 Vicryl interrupted. I then closed the skin with 3-0 Nylon interrupted and vertical mattress sutures interspersed to help prevent  rolling of the skin edges.  Overall the appearance of the closure and the shape of the breast were good with the superior portion having fullness. There was a minor indentation in the inferior aspect at 5 o'clock to  the nipple areolar complex, but I believe this will be minor after she heals.   The incision was covered with some bacitracin  and dressing was placed.   Final inspection revealed acceptable hemostasis. All counts were correct at the end of the case. The patient was awakened from anesthesia and extubated without complication.  The patient went to the PACU in stable condition.   Deena Farrier, MD [facility] [address] [place], [place] [address] [phone] (office)

## 2024-01-23 ENCOUNTER — Encounter (HOSPITAL_COMMUNITY): Payer: Self-pay | Admitting: General Surgery

## 2024-01-23 NOTE — Anesthesia Postprocedure Evaluation (Signed)
 Anesthesia Post Note  Patient: Patricia Stanton  Procedure(s) Performed: BREAST LUMPECTOMY (Left: Breast)  Patient location during evaluation: Phase II Anesthesia Type: General Level of consciousness: awake Pain management: pain level controlled Vital Signs Assessment: post-procedure vital signs reviewed and stable Respiratory status: spontaneous breathing and respiratory function stable Cardiovascular status: blood pressure returned to baseline and stable Postop Assessment: no headache and no apparent nausea or vomiting Anesthetic complications: no Comments: Late entry   No notable events documented.   Last Vitals:  Vitals:   01/22/24 1006 01/22/24 1015  BP:  117/73  Pulse:  86  Resp:  18  Temp: (!) 36.4 C 36.7 C  SpO2:  94%    Last Pain:  Vitals:   01/22/24 1015  TempSrc: Oral  PainSc: 0-No pain                 Coretha Dew

## 2024-01-26 ENCOUNTER — Other Ambulatory Visit (HOSPITAL_COMMUNITY): Payer: Self-pay | Admitting: General Surgery

## 2024-01-26 DIAGNOSIS — N611 Abscess of the breast and nipple: Secondary | ICD-10-CM

## 2024-01-26 LAB — SURGICAL PATHOLOGY

## 2024-01-26 MED ORDER — CIPROFLOXACIN HCL 500 MG PO TABS: 500.0000 mg | ORAL_TABLET | Freq: Two times a day (BID) | ORAL | 0 refills | Status: AC

## 2024-01-26 NOTE — Progress Notes (Signed)
 Patient's abscess growing out Staph pei and Staph lugdensis.Cipro  sensitive. Lets extend her Cipro  for 5 more days (original RX was 5 and I have sent in a refill). Can you let her know.

## 2024-01-27 ENCOUNTER — Ambulatory Visit (INDEPENDENT_AMBULATORY_CARE_PROVIDER_SITE_OTHER): Payer: Self-pay | Admitting: General Surgery

## 2024-01-27 DIAGNOSIS — N611 Abscess of the breast and nipple: Secondary | ICD-10-CM

## 2024-01-27 LAB — AEROBIC/ANAEROBIC CULTURE W GRAM STAIN (SURGICAL/DEEP WOUND): Gram Stain: NONE SEEN

## 2024-01-27 NOTE — Progress Notes (Signed)
 Negative for any malignancy. Abscess cavity noted on the pathology. Please let the patient know.

## 2024-02-04 ENCOUNTER — Encounter: Payer: Self-pay | Admitting: General Surgery

## 2024-02-04 ENCOUNTER — Ambulatory Visit (INDEPENDENT_AMBULATORY_CARE_PROVIDER_SITE_OTHER): Admitting: General Surgery

## 2024-02-04 VITALS — BP 138/84 | HR 114 | Temp 98.3°F | Resp 18 | Ht 65.0 in | Wt 263.0 lb

## 2024-02-04 DIAGNOSIS — N611 Abscess of the breast and nipple: Secondary | ICD-10-CM | POA: Diagnosis not present

## 2024-02-04 DIAGNOSIS — T8130XA Disruption of wound, unspecified, initial encounter: Secondary | ICD-10-CM | POA: Diagnosis not present

## 2024-02-04 MED ORDER — DOXYCYCLINE HYCLATE 100 MG PO TABS
100.0000 mg | ORAL_TABLET | Freq: Two times a day (BID) | ORAL | 0 refills | Status: DC
Start: 2024-02-04 — End: 2024-02-04

## 2024-02-04 MED ORDER — SULFAMETHOXAZOLE-TRIMETHOPRIM 800-160 MG PO TABS: 1.0000 | ORAL_TABLET | Freq: Two times a day (BID) | ORAL | 0 refills | Status: AC

## 2024-02-04 NOTE — Progress Notes (Signed)
 [facility]  Reported some minor drainage. Pain controlled. Finished her antibiotics.  BP 138/84   Pulse (!) 114   Temp 98.3 F (36.8 C) (Oral)   Resp 18   Ht 5\' 5"  (1.651 m)   Wt 263 lb (119.3 kg)   SpO2 94%   BMI 43.77 kg/m  Soft, all sutures intact, no signs of erythema of the skin Sutures removed and in the superior portion above areola, purulent drainage from incision noted, area opened up to 4cm and copious amounts of purulence evacuated, culture obtained Irrigated and packed with saline gauze  Patient s/p Partial mastectomy of recurrent breast abscess. Pathology all benign. Culture grew multiple species and sensitive to Cipro . She took these antibiotics.  Patient at risk for recurrent abscesses due to smoking and diabetes, A1c high. Discussed this with her multiple times. Patient and family frustrated and I discussed that I am frustrated too. Want her to heal and stop having issues. Encouraged smoking cessation and staying on her diabetes medications.   Pack the wound with saline dampened gauze daily to twice daily. Can flush area with saline before packing. Area may open up more. Culture sent Bactrim  for antibiotic sent in (last species were sensitive to bactrim )  Smoking cessation Keep your diabetes under control   Future Appointments  Date Time Provider Department Center  02/12/2024  1:15 PM Awilda Bogus, MD [facility] None   Deena Farrier, MD [facility] [address] [place], [place] [address] [phone] (office)

## 2024-02-04 NOTE — Patient Instructions (Addendum)
 Pack the wound with saline dampened gauze daily to twice daily. Can flush area with saline before packing. Area may open up more. Culture sent Bactrim  for antibiotic sent in  Smoking cessation Keep your diabetes under control

## 2024-02-07 LAB — WOUND CULTURE
MICRO NUMBER:: 16484670
SPECIMEN QUALITY:: ADEQUATE

## 2024-02-12 ENCOUNTER — Ambulatory Visit (INDEPENDENT_AMBULATORY_CARE_PROVIDER_SITE_OTHER): Admitting: General Surgery

## 2024-02-12 ENCOUNTER — Encounter: Payer: Self-pay | Admitting: General Surgery

## 2024-02-12 VITALS — BP 160/105 | HR 99 | Temp 98.4°F | Resp 16 | Ht 65.0 in | Wt 261.0 lb

## 2024-02-12 DIAGNOSIS — N611 Abscess of the breast and nipple: Secondary | ICD-10-CM

## 2024-02-12 DIAGNOSIS — T8130XA Disruption of wound, unspecified, initial encounter: Secondary | ICD-10-CM

## 2024-02-12 NOTE — Patient Instructions (Signed)
 Continue packing wound at least twice daily with saline dampened gauze.  Keep area covered. Call with any issues.

## 2024-02-12 NOTE — Progress Notes (Unsigned)
 [facility]  Future Appointments  Date Time Provider Department Center  02/25/2024 10:00 AM Awilda Bogus, MD [facility] None

## 2024-02-13 DIAGNOSIS — Z131 Encounter for screening for diabetes mellitus: Secondary | ICD-10-CM | POA: Diagnosis not present

## 2024-02-13 DIAGNOSIS — Z0000 Encounter for general adult medical examination without abnormal findings: Secondary | ICD-10-CM | POA: Diagnosis not present

## 2024-02-13 DIAGNOSIS — Z13220 Encounter for screening for lipoid disorders: Secondary | ICD-10-CM | POA: Diagnosis not present

## 2024-02-25 ENCOUNTER — Ambulatory Visit: Admitting: General Surgery

## 2024-02-25 ENCOUNTER — Encounter: Payer: Self-pay | Admitting: General Surgery

## 2024-02-25 VITALS — BP 163/95 | HR 101 | Temp 98.0°F | Resp 14 | Ht 65.0 in | Wt 260.0 lb

## 2024-02-25 DIAGNOSIS — N611 Abscess of the breast and nipple: Secondary | ICD-10-CM

## 2024-02-25 DIAGNOSIS — T8130XA Disruption of wound, unspecified, initial encounter: Secondary | ICD-10-CM

## 2024-02-25 NOTE — Progress Notes (Signed)
 [facility]  Packing going well. She has noticed some white tissue at the bottom. She has used some peroxide to try to get it out and make sure it was not gauze. BS are better.  BP (!) 163/95   Pulse (!) 101   Temp 98 F (36.7 C) (Oral)   Resp 14   Ht 5' 5 (1.651 m)   Wt 260 lb (117.9 kg)   SpO2 96%   BMI 43.27 kg/m  Area with granulation in the left breast, the nipple areola skin is starting to round over the edge, fibrotic tissue was noted at the bottom and silver nitrate applied, saline packed gauze placed into the area   Patient s/p left breast excision for recurrent abscesses, wound opened up. Packing now.  Do silver nitrate to the bottom of the wound or areas where there is any of that white tissue (fibrotic tissue). Pack the area twice daily with saline dampened gauze, be sure to pack the whole area so the skin does not start to form on the underside of the edges (not rolling).  Future Appointments  Date Time Provider Department Center  03/09/2024  9:15 AM Awilda Bogus, MD [facility] None    Deena Farrier, MD [facility] [address] [place], [place] [address] [phone] (office)

## 2024-02-25 NOTE — Patient Instructions (Signed)
 Do silver nitrate to the bottom of the wound or areas where there is any of that white tissue (fibrotic tissue). Pack the area twice daily with saline dampened gauze, be sure to pack the whole area so the skin does not start to form on the underside of the edges (not rolling).

## 2024-03-04 ENCOUNTER — Encounter: Payer: Self-pay | Admitting: *Deleted

## 2024-03-04 ENCOUNTER — Telehealth: Payer: Self-pay | Admitting: *Deleted

## 2024-03-04 NOTE — Telephone Encounter (Signed)
 Received call from patient [phone]~ telephone.   Patient reports that she was reviewing her medical records on MyChart and noted Dx of Adrenal Tumor. Inquired if this is contributing to hyperglycemia/ hypertension which may delay healing.   Upon review of chart, noted left adrenal nodule first reported on CT 12/26/2015. Patient has had follow up CT/ MRI and was evaluated by both GI and Hem/Onc. Follow-up MRI 2022 confirmed benign, macroscopic fat-containing left adrenal adenoma that required no further work up. Notes report that it was unable to be determined if nodule is contributing to DM/HTN.   Advised patient to discuss concerns regarding DM/HTN with PCP. States that she has appointment at the end of the month.   Patient has follow up with Dr. Collene Dawson on 03/09/2024. Advised that while adrenal tumor may delay healing, it would not change treatment plan of breast abscess at this time.

## 2024-03-09 ENCOUNTER — Ambulatory Visit (INDEPENDENT_AMBULATORY_CARE_PROVIDER_SITE_OTHER): Admitting: General Surgery

## 2024-03-09 ENCOUNTER — Encounter: Payer: Self-pay | Admitting: General Surgery

## 2024-03-09 VITALS — BP 174/107 | HR 95 | Temp 98.3°F | Resp 16 | Ht 65.0 in | Wt 257.0 lb

## 2024-03-09 DIAGNOSIS — T8130XA Disruption of wound, unspecified, initial encounter: Secondary | ICD-10-CM

## 2024-03-09 DIAGNOSIS — N611 Abscess of the breast and nipple: Secondary | ICD-10-CM

## 2024-03-09 NOTE — Patient Instructions (Addendum)
 Adrenal Adenoma- Left CT scan 2/ 2022- You had a left adrenal lesion measuring 3.4cm and had previously measured 2.5cm. They recommended the MRI.  MRI abdomen 11/2020-  They said you had a definitively benign, macroscopic fat containing left adrenal adenoma measuring 3.1 cm   This being said- an Adrenal adenoma could potentially secrete hormones and the testing of these hormones would be based on your clinically history of hypertension, potassium levels and other things.  Typically this would be worked up by your PCP and/ or an Endocrinologist if this was a concern.   Breast Abscess- Continue to pack your abscess as you have been doing. Silver nitrate to the area as needed.

## 2024-03-10 NOTE — Progress Notes (Signed)
 [facility]  Doing well. Packing going well.   BP (!) 174/107   Pulse 95   Temp 98.3 F (36.8 C) (Oral)   Resp 16   Ht 5' 5 (1.651 m)   Wt 257 lb (116.6 kg)   SpO2 96%   BMI 42.77 kg/m  Granulation at base, some minor fibrinous tissue, silver nitrate Opening about 1.5cm in size, 1.5cm deep   Patient s/p excision of recurrent breast abscess, wound dehiscence. Doing well. She reported a history of adrenal adenoma in her past and asked me to look at this. She was worried because she had been reading about adenomas.  She has hypertension that has been somewhat uncontrolled.   Reviewed her imaging and follow up which was done by GI in the past.   Adrenal Adenoma- Left CT scan 2/ 2022- You had a left adrenal lesion measuring 3.4cm and had previously measured 2.5cm. They recommended the MRI.  MRI abdomen 11/2020-  They said you had a definitively benign, macroscopic fat containing left adrenal adenoma measuring 3.1 cm   This being said- an Adrenal adenoma could potentially secrete hormones and the testing of these hormones would be based on your clinically history of hypertension, potassium levels and other things.   Typically this would be worked up by your PCP and/ or an Endocrinologist if this was a concern. Recommended she follow up with her PCP. Will make sure my note goes to the PCP.   Breast Abscess- Continue to pack your abscess as you have been doing. Silver nitrate to the area as needed.  Manuelita Pander, MD [facility] [address] [place], [place] [address] [phone] (office)

## 2024-03-23 ENCOUNTER — Ambulatory Visit (INDEPENDENT_AMBULATORY_CARE_PROVIDER_SITE_OTHER): Admitting: General Surgery

## 2024-03-23 ENCOUNTER — Encounter: Payer: Self-pay | Admitting: General Surgery

## 2024-03-23 ENCOUNTER — Other Ambulatory Visit: Payer: Self-pay

## 2024-03-23 VITALS — BP 160/91 | HR 95 | Temp 98.0°F | Resp 16 | Ht 65.0 in | Wt 262.0 lb

## 2024-03-23 DIAGNOSIS — N611 Abscess of the breast and nipple: Secondary | ICD-10-CM

## 2024-03-23 NOTE — Progress Notes (Signed)
 [facility]  Doing well. No major issues.   Packign going well.   BP (!) 160/91 (BP Location: Right Arm, Patient Position: Sitting, Cuff Size: Large)   Pulse 95   Temp 98 F (36.7 C) (Oral)   Resp 16   Ht 5' 5 (1.651 m)   Wt 262 lb (118.8 kg)   SpO2 97%   BMI 43.60 kg/m  Packing replaced, granulating and some minor fibrinous tissue, silver nitrate to area  Patient s/p reexcision of abscess of the left breast. Healing.  Continue packing   Future Appointments  Date Time Provider Department Center  04/07/2024 11:30 AM Kallie Manuelita BROCKS, MD [facility] None   Manuelita Kallie, MD [facility] [address] [place], [place] [address] [phone] (office)

## 2024-03-23 NOTE — Patient Instructions (Signed)
 Continue packing.  Do silver nitrate to area as needed. Call with issues.

## 2024-03-24 ENCOUNTER — Telehealth: Payer: Self-pay | Admitting: *Deleted

## 2024-03-24 NOTE — Telephone Encounter (Signed)
 Received call from patient [phone]~ telephone.   Patient reports that PCP is referring her to endocrinology for DM. States that referral was sent to [facility] in [place] and earliest available appointment in in January 2026.   Advised patient to contact local endocrinologists to determine if earlier appointment could be made.   Patient contacted Dr. Barbette office. Reports that referral is required.   Advised that patient should obtain referral from PCP. Patient agreeable to plan.

## 2024-04-07 ENCOUNTER — Ambulatory Visit (INDEPENDENT_AMBULATORY_CARE_PROVIDER_SITE_OTHER): Admitting: General Surgery

## 2024-04-07 ENCOUNTER — Encounter: Payer: Self-pay | Admitting: General Surgery

## 2024-04-07 VITALS — BP 146/81 | HR 93 | Temp 98.0°F | Resp 18 | Ht 65.0 in | Wt 253.0 lb

## 2024-04-07 DIAGNOSIS — N611 Abscess of the breast and nipple: Secondary | ICD-10-CM

## 2024-04-07 DIAGNOSIS — T8130XA Disruption of wound, unspecified, initial encounter: Secondary | ICD-10-CM

## 2024-04-07 NOTE — Patient Instructions (Signed)
 Continue packing as needed. Area getting covered with skin with small area still slightly open.

## 2024-04-07 NOTE — Progress Notes (Signed)
 [facility]  No major issues.   BP (!) 146/81 (BP Location: Right Arm, Patient Position: Sitting, Cuff Size: Large)   Pulse 93   Temp 98 F (36.7 C) (Oral)   Resp 18   Ht 5' 5 (1.651 m)   Wt 253 lb (114.8 kg)   SpO2 97%   BMI 42.10 kg/m  Left breast wound healing, epithelization of the skin and around the areola, from the dehiscence it appears we have formed a cavity of epithelize tissue. Wound overall healing.   Patient with healing recurrence abscess. Looking good overall. May have to revise the scar in a few months as areola is pulling away from breast.   Continue packing as needed. Area getting covered with skin with small area still slightly open.   Future Appointments  Date Time Provider Department Center  04/28/2024 10:30 AM Kallie Manuelita BROCKS, MD [facility] None  07/14/2024  9:00 AM Therisa Benton PARAS, NP [facility] None   Manuelita Kallie, MD [facility] [address] [place], [place] [address] [phone] (office)

## 2024-04-14 DIAGNOSIS — Z79899 Other long term (current) drug therapy: Secondary | ICD-10-CM | POA: Diagnosis not present

## 2024-04-28 ENCOUNTER — Ambulatory Visit (INDEPENDENT_AMBULATORY_CARE_PROVIDER_SITE_OTHER): Admitting: General Surgery

## 2024-04-28 ENCOUNTER — Other Ambulatory Visit: Payer: Self-pay

## 2024-04-28 ENCOUNTER — Encounter: Payer: Self-pay | Admitting: General Surgery

## 2024-04-28 VITALS — BP 154/87 | HR 97 | Temp 98.4°F | Resp 16 | Ht 65.0 in | Wt 252.2 lb

## 2024-04-28 DIAGNOSIS — N611 Abscess of the breast and nipple: Secondary | ICD-10-CM

## 2024-04-28 DIAGNOSIS — T8130XA Disruption of wound, unspecified, initial encounter: Secondary | ICD-10-CM | POA: Diagnosis not present

## 2024-04-28 NOTE — Progress Notes (Signed)
 [facility]  Doing well. Not able to pack the area any more.  BP (!) 154/87 (BP Location: Right Arm, Patient Position: Sitting, Cuff Size: Large)   Pulse 97   Temp 98.4 F (36.9 C) (Oral)   Resp 16   Ht 5' 5 (1.651 m)   Wt 252 lb 3.2 oz (114.4 kg)   LMP  (LMP Unknown)   SpO2 95%   BMI 41.97 kg/m  Left breast scar with skin flaps where the incision dehisced, so there is a tunnel of skin fold  Patient s/p excision of recurrent breast abscess. Doing well.   Ok to get in pool but not any dirty water  like a lake. Continue to keep area clean otherwise and monitor. For the skin flaps were the scar healed we  can determine if we need to do any scar revision in November.  Future Appointments  Date Time Provider Department Center  07/14/2024  9:00 AM Therisa Benton PARAS, NP [facility] None  08/03/2024 10:45 AM Kallie Manuelita BROCKS, MD [facility] None   Manuelita Kallie, MD [facility] [address] [place], [place] [address] [phone] (office)

## 2024-04-28 NOTE — Patient Instructions (Signed)
 Ok to get in pool but not any dirty water  like a lake. Continue to keep area clean otherwise and monitor. For the skin flaps were the scar healed we  can determine if we need to do any scar revision in November.

## 2024-05-14 DIAGNOSIS — E7800 Pure hypercholesterolemia, unspecified: Secondary | ICD-10-CM | POA: Diagnosis not present

## 2024-05-14 DIAGNOSIS — Z79899 Other long term (current) drug therapy: Secondary | ICD-10-CM | POA: Diagnosis not present

## 2024-05-14 DIAGNOSIS — E119 Type 2 diabetes mellitus without complications: Secondary | ICD-10-CM | POA: Diagnosis not present

## 2024-06-04 ENCOUNTER — Telehealth: Payer: Self-pay | Admitting: *Deleted

## 2024-06-04 DIAGNOSIS — I129 Hypertensive chronic kidney disease with stage 1 through stage 4 chronic kidney disease, or unspecified chronic kidney disease: Secondary | ICD-10-CM

## 2024-06-04 NOTE — Progress Notes (Signed)
 Complex Care Management Note  Care Guide Note 06/04/2024 Name: ALIYANAH ROZAS MRN: [id number] DOB: [birth year]  Delon CHRISTELLA Kwan is a 38 y.o. year old female who sees Jerel Gee, NP for primary care. I reached out to [employer] by phone today to offer complex care management services.  Ms. Derrig was given information about Complex Care Management services today including:   The Complex Care Management services include support from the care team which includes your Nurse Care Manager, Clinical Social Worker, or Pharmacist.  The Complex Care Management team is here to help remove barriers to the health concerns and goals most important to you. Complex Care Management services are voluntary, and the patient may decline or stop services at any time by request to their care team member.   Complex Care Management Consent Status: During the call, it was determined that the patient's primary care provider is not affiliated with [facility], which makes the patient ineligible for Complex Care Management services   Encounter Outcome:  ineligible for enrollment  Harlene [facility]  [facility], [facility] Guide  Direct Dial: [phone]  Fax [phone]

## 2024-06-15 DIAGNOSIS — Z79899 Other long term (current) drug therapy: Secondary | ICD-10-CM | POA: Diagnosis not present

## 2024-06-22 ENCOUNTER — Ambulatory Visit: Admitting: Nurse Practitioner

## 2024-06-22 ENCOUNTER — Ambulatory Visit: Admitting: "Endocrinology

## 2024-06-22 ENCOUNTER — Encounter: Payer: Self-pay | Admitting: "Endocrinology

## 2024-06-22 VITALS — BP 140/78 | HR 96 | Ht 65.0 in | Wt 253.4 lb

## 2024-06-22 DIAGNOSIS — I10 Essential (primary) hypertension: Secondary | ICD-10-CM | POA: Diagnosis not present

## 2024-06-22 DIAGNOSIS — E279 Disorder of adrenal gland, unspecified: Secondary | ICD-10-CM | POA: Diagnosis not present

## 2024-06-22 DIAGNOSIS — Z6841 Body Mass Index (BMI) 40.0 and over, adult: Secondary | ICD-10-CM | POA: Diagnosis not present

## 2024-06-22 DIAGNOSIS — Z7985 Long-term (current) use of injectable non-insulin antidiabetic drugs: Secondary | ICD-10-CM | POA: Diagnosis not present

## 2024-06-22 DIAGNOSIS — E1165 Type 2 diabetes mellitus with hyperglycemia: Secondary | ICD-10-CM | POA: Diagnosis not present

## 2024-06-22 DIAGNOSIS — E782 Mixed hyperlipidemia: Secondary | ICD-10-CM | POA: Diagnosis not present

## 2024-06-22 DIAGNOSIS — Z7984 Long term (current) use of oral hypoglycemic drugs: Secondary | ICD-10-CM

## 2024-06-22 NOTE — Patient Instructions (Signed)

## 2024-06-22 NOTE — Progress Notes (Signed)
 Endocrinology Consult Note       06/22/2024, 11:27 AM   Subjective:    Patient ID: Patricia Stanton, female    DOB: [birth year].  Patricia Stanton is being seen in consultation for management of currently uncontrolled symptomatic diabetes requested by  Jerel Gee, NP.   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) 03/25/2019   Mood disorder    Obesity    Ovarian cyst    Tobacco abuse     Past Surgical History:  Procedure Laterality Date   BIOPSY  10/22/2023   Procedure: BIOPSY;  Surgeon: Shaaron Lamar HERO, MD;  [facility];  Service: Endoscopy;;   BREAST BIOPSY Left 06/17/2023   US  LT BREAST BX W LOC DEV 1ST LESION IMG BX SPEC US  GUIDE 06/17/2023 [facility]   BREAST BIOPSY Left 06/17/2023   US  LT BREAST BX W LOC DEV EA ADD LESION IMG BX SPEC US  GUIDE 06/17/2023 [facility]   BREAST BIOPSY Left 06/17/2023   US  LT BREAST BX W LOC DEV EA ADD LESION IMG BX SPEC US  GUIDE 06/17/2023 [facility]   BREAST BIOPSY Left 07/11/2023   Procedure: LEFT BREAST BIOPSY;  Surgeon: Kallie Manuelita BROCKS, MD;  Location: [facility];  Service: General;  Laterality: Left;   BREAST LUMPECTOMY Left 01/22/2024   Procedure: BREAST LUMPECTOMY;  Surgeon: Kallie Manuelita BROCKS, MD;  Location: [facility];  Service: General;  Laterality: Left;   CESAREAN SECTION  2008   [facility]   CHOLECYSTECTOMY N/A 11/25/2014   Procedure: LAPAROSCOPIC CHOLECYSTECTOMY;  Surgeon: Oneil Budge Md, MD;  Location: [facility];  Service: General;  Laterality: N/A;   COLONOSCOPY WITH PROPOFOL   N/A 10/22/2023   Procedure: COLONOSCOPY WITH PROPOFOL ;  Surgeon: Shaaron Lamar HERO, MD;  [facility];  Service: Endoscopy;  Laterality: N/A;  900am, asa 3   ESOPHAGOGASTRODUODENOSCOPY N/A 12/21/2015   Dr. Shaaron: normal esophagus, non-bleeding erosive gastropathy, normal second portion of duodenum. Reactive gastropathy/chemical gastritis, negative H.pylori   ESOPHAGOGASTRODUODENOSCOPY (EGD) WITH PROPOFOL  N/A 11/23/2020    Surgeon: Shaaron Lamar HERO, MD; normal esophagus, small hiatal hernia, normal examined duodenum.   ESOPHAGOGASTRODUODENOSCOPY (EGD) WITH PROPOFOL  N/A 10/22/2023   Procedure: ESOPHAGOGASTRODUODENOSCOPY (EGD) WITH PROPOFOL ;  Surgeon: Shaaron Lamar HERO, MD;  [facility];  Service: Endoscopy;  Laterality: N/A;   FEMUR FRACTURE SURGERY Left 1995   tree fell on her   HERNIA REPAIR     INCISION AND DRAINAGE ABSCESS Left 07/11/2023   Procedure: INCISION AND DRAINAGE ABSCESS, BREAST;  Surgeon: Kallie Manuelita BROCKS, MD;  Location: [facility];  Service: General;  Laterality: Left;   INCISIONAL HERNIA REPAIR N/A 01/29/2016   Procedure: SHIRLENE HERNIORRHAPHY WITH MESH;  Surgeon: Oneil  Mavis, MD;  Location: [facility];  Service: General;  Laterality: N/A;   INSERTION OF MESH  01/29/2016   Procedure: INSERTION OF MESH;  Surgeon: Oneil Mavis, MD;  Location: [facility];  Service: General;;   KNEE ARTHROSCOPY Left    LUMBAR LAMINECTOMY/DECOMPRESSION MICRODISCECTOMY Right 05/21/2019   Procedure: Microdiscectomy - Lumbar four-Lumbar five - right;  Surgeon: Joshua Alm RAMAN, MD;  Location: [facility];  Service: Neurosurgery;  Laterality: Right;   TUBAL LIGATION      Social History   Socioeconomic History   Marital status: Divorced    Spouse name: Not on file   Number of children: 1   Years of education: 14   Highest education level: Not on file  Occupational History   Occupation: disability  Tobacco Use   Smoking status: Every Day    Current packs/day: 0.50    Average packs/day: 0.5  packs/day for 20.8 years (10.4 ttl pk-yrs)    Types: Cigarettes    Start date: 09/17/2003    Passive exposure: Never   Smokeless tobacco: Never  Vaping Use   Vaping status: Never Used  Substance and Sexual Activity   Alcohol use: No   Drug use: No   Sexual activity: Yes    Birth control/protection: Surgical    Comment: BTL  Other Topics Concern   Not on file  Social History Narrative   Disabled from nursing   Lives at home with daughter Mace   Social Drivers of Health   Financial Resource Strain: Low Risk  (12/27/2022)   Overall Financial Resource Strain (CARDIA)    Difficulty of Paying Living Expenses: Not hard at all  Food Insecurity: No Food Insecurity (12/27/2022)   Hunger Vital Sign    Worried About Running Out of Food in the Last Year: Never true    Ran Out of Food in the Last Year: Never true  Transportation Needs: No Transportation Needs (12/27/2022)   PRAPARE - Administrator, Civil Service (Medical): No    Lack of Transportation (Non-Medical): No  Physical Activity: Inactive (12/27/2022)   Exercise Vital Sign    Days of Exercise per Week: 0 days    Minutes of Exercise per Session: 0 min  Stress: Stress Concern Present (12/27/2022)   [employer] of Occupational Health - Occupational Stress Questionnaire    Feeling of Stress : Very much  Social Connections: Moderately Isolated (12/27/2022)   Social Connection and Isolation Panel    Frequency of Communication with Friends and Family: More than three times a week    Frequency of Social Gatherings with Friends and Family: More than three times a week    Attends Religious Services: Never    Database administrator or Organizations: No    Attends Engineer, structural: Never    Marital Status: Living with partner    Family History  Problem Relation Age of Onset   Hypertension Mother    Hyperlipidemia Mother    Fibromyalgia Mother    Other Mother        degenerative disc disease    Arthritis Mother    Kidney disease Mother    Diabetes Father    Hypertension Father    Hyperlipidemia Father    Heart disease Father 26   Heart attack Father    Stroke Father    Hyperlipidemia Brother    Hypertension Brother    Aneurysm Maternal Grandmother        AAA   Kidney disease Maternal Grandmother  Kidney disease Maternal Grandfather    Aneurysm Paternal Grandmother        AAA   Kidney disease Paternal Grandmother    Kidney disease Paternal Grandfather    Colon cancer Neg Hx    Inflammatory bowel disease Neg Hx     Outpatient Encounter Medications as of 06/22/2024  Medication Sig   MOUNJARO 10 MG/0.5ML Pen Inject 10 mg into the skin once a week. (Patient taking differently: Inject 7.5 mg into the skin once a week.)   albuterol  (PROVENTIL ) (2.5 MG/3ML) 0.083% nebulizer solution Take 2.5 mg by nebulization every 6 (six) hours as needed for wheezing or shortness of breath.   gabapentin  (NEURONTIN ) 600 MG tablet Take 600 mg by mouth 3 (three) times daily.   lisinopril  (ZESTRIL ) 2.5 MG tablet Take 2.5 mg by mouth daily as needed (when systolic and diastolic bp are 3 digit numbers).   metFORMIN  (GLUCOPHAGE ) 500 MG tablet Take 500 mg by mouth 2 (two) times daily with a meal.   methocarbamol  (ROBAXIN ) 500 MG tablet Take 1 tablet (500 mg total) by mouth 3 (three) times daily as needed for muscle spasms.   oxyCODONE -acetaminophen  (PERCOCET/ROXICET) 5-325 MG tablet Take 1 tablet by mouth every 4 (four) hours as needed for moderate pain. (Patient taking differently: Take 1 tablet by mouth in the morning, at noon, in the evening, and at bedtime.)   VENTOLIN  HFA 108 (90 Base) MCG/ACT inhaler Inhale 1 puff into the lungs every 6 (six) hours as needed for shortness of breath or wheezing.   No facility-administered encounter medications on file as of 06/22/2024.    ALLERGIES: Allergies  Allergen Reactions   Fentanyl  Other (See Comments)    Patient does not wish to ever receive fentanyl   again.  She stated this makes her extremely irritable to others around her for approximately 24 hours.     Tramadol  Other (See Comments)    Exacerbates her asthma    Nsaids Other (See Comments)    Unknown    Vicks Nyquil Cold & Flu Night [Dm-Apap-Cpm] Hives and Other (See Comments)    Fever blisters around mouth    VACCINATION STATUS: Immunization History  Administered Date(s) Administered   Influenza,inj,Quad PF,6+ Mos 06/08/2013, 06/03/2017, 06/26/2018, 06/25/2021   Influenza,inj,quad, With Preservative 05/17/2017   Pneumococcal Polysaccharide-23 06/08/2013   Tdap 06/26/2018    Diabetes She presents for her initial diabetic visit. She has type 2 diabetes mellitus. Onset time: She was diagnosed at approximately age of 29 years. There are no hypoglycemic associated symptoms. Pertinent negatives for hypoglycemia include no confusion, headaches, pallor or seizures. There are no diabetic associated symptoms. Pertinent negatives for diabetes include no chest pain, no polydipsia, no polyphagia and no polyuria. There are no hypoglycemic complications. Symptoms are improving. There are no diabetic complications. Risk factors for coronary artery disease include diabetes mellitus, obesity, family history and hypertension. Current diabetic treatments: She is currently on Mounjaro 7.5 mg weekly and metformin  500 mg p.o. twice daily. Her weight is fluctuating minimally. She is following a generally unhealthy diet. She has not had a previous visit with a dietitian. Her home blood glucose trend is decreasing steadily. Her overall blood glucose range is 180-200 mg/dl. (She did not bring any logs nor meter with her.  Her recent A1c was 7.5%, improving from 10.4% in May 2025.  Patient denies hypoglycemia.) An ACE inhibitor/angiotensin II receptor blocker is being taken.  Hypertension This is a chronic problem. The problem is uncontrolled. Pertinent negatives include no chest pain, headaches,  palpitations or  shortness of breath. Risk factors for coronary artery disease include diabetes mellitus, dyslipidemia, family history, obesity, sedentary lifestyle and smoking/tobacco exposure. Past treatments include ACE inhibitors.  Hyperlipidemia This is a chronic problem. The problem is uncontrolled. Exacerbating diseases include diabetes and obesity. Pertinent negatives include no chest pain, myalgias or shortness of breath. She is currently on no antihyperlipidemic treatment. Risk factors for coronary artery disease include dyslipidemia, family history, obesity, hypertension, a sedentary lifestyle and diabetes mellitus.   Left adrenal nodule: Patient is documented to have left adrenal nodule since 2017.  Repeat imaging in 2022 showed some growth.  Patient did not have any endocrine workup.    Review of Systems  Constitutional:  Negative for chills, fever and unexpected weight change.  HENT:  Negative for trouble swallowing and voice change.   Eyes:  Negative for visual disturbance.  Respiratory:  Negative for cough, shortness of breath and wheezing.   Cardiovascular:  Negative for chest pain, palpitations and leg swelling.  Gastrointestinal:  Negative for diarrhea, nausea and vomiting.  Endocrine: Negative for cold intolerance, heat intolerance, polydipsia, polyphagia and polyuria.  Musculoskeletal:  Negative for arthralgias and myalgias.  Skin:  Negative for color change, pallor, rash and wound.  Neurological:  Negative for seizures and headaches.  Psychiatric/Behavioral:  Negative for confusion and suicidal ideas.     Objective:       06/22/2024   10:46 AM 04/28/2024   10:34 AM 04/28/2024   10:31 AM  Vitals with BMI  Height 5' 5  5' 5  Weight 253 lbs 6 oz  252 lbs 3 oz  BMI 42.17  41.97  Systolic 140 154 858  Diastolic 78 87 90  Pulse 96  97    BP (!) 140/78   Pulse 96   Ht 5' 5 (1.651 m)   Wt 253 lb 6.4 oz (114.9 kg)   BMI 42.17 kg/m   Wt Readings from Last 3 Encounters:   06/22/24 253 lb 6.4 oz (114.9 kg)  04/28/24 252 lb 3.2 oz (114.4 kg)  04/07/24 253 lb (114.8 kg)     Physical Exam Constitutional:      Appearance: She is well-developed.  HENT:     Head: Normocephalic and atraumatic.  Neck:     Thyroid : No thyromegaly.     Trachea: No tracheal deviation.  Cardiovascular:     Rate and Rhythm: Normal rate and regular rhythm.  Pulmonary:     Effort: Pulmonary effort is normal.     Breath sounds: Normal breath sounds.  Abdominal:     Tenderness: There is no abdominal tenderness. There is no guarding.  Musculoskeletal:        General: Normal range of motion.     Cervical back: Normal range of motion and neck supple.  Skin:    General: Skin is warm and dry.     Coloration: Skin is not pale.     Findings: No erythema or rash.  Neurological:     Mental Status: She is alert and oriented to person, place, and time.     Cranial Nerves: No cranial nerve deficit.     Coordination: Coordination normal.     Deep Tendon Reflexes: Reflexes are normal and symmetric.  Psychiatric:        Judgment: Judgment normal.     CMP ( most recent) CMP     Component Value Date/Time   NA 134 (L) 01/20/2024 0846   NA 139 06/25/2021 1027   K 4.3 01/20/2024  0846   CL 101 01/20/2024 0846   CO2 20 (L) 01/20/2024 0846   GLUCOSE 301 (H) 01/20/2024 0846   BUN 7 01/20/2024 0846   BUN 27 (H) 06/25/2021 1027   CREATININE 0.49 01/20/2024 0846   CREATININE 0.46 (L) 09/02/2023 0903   CALCIUM 8.6 (L) 01/20/2024 0846   PROT 6.5 09/02/2023 0903   PROT 7.4 06/25/2021 1027   ALBUMIN 5.0 (H) 06/25/2021 1027   AST 16 09/02/2023 0903   ALT 16 09/02/2023 0903   ALKPHOS 78 06/25/2021 1027   BILITOT 0.4 09/02/2023 0903   BILITOT 0.8 06/25/2021 1027   EGFR 126 09/02/2023 0903   EGFR 51 (L) 06/25/2021 1027   GFRNONAA >60 01/20/2024 0846   GFRNONAA 125 11/09/2020 1314     Diabetic Labs (most recent): Lab Results  Component Value Date   HGBA1C 10.9 (H) 01/20/2024    HGBA1C 9.9 (H) 11/04/2023   HGBA1C 10.1 (H) 07/10/2023   MICROALBUR 1.9 02/14/2017     Lipid Panel ( most recent) Lipid Panel     Component Value Date/Time   CHOL 188 03/25/2019 0842   TRIG 205 (H) 03/25/2019 0842   HDL 42 03/25/2019 0842   CHOLHDL 4.5 (H) 03/25/2019 0842   CHOLHDL 4.8 05/21/2017 0836   VLDL 29 02/14/2017 0842   LDLCALC 105 (H) 03/25/2019 0842   LDLCALC 106 (H) 05/21/2017 0836   LABVLDL 41 (H) 03/25/2019 0842      Lab Results  Component Value Date   TSH 1.190 12/21/2020   TSH 1.780 03/25/2019   TSH 2.330 11/17/2018   TSH 1.630 12/29/2017   TSH 1.38 11/19/2016   TSH 1.425 06/09/2014   TSH 0.561 06/07/2013      Assessment & Plan:    1. Type 2 diabetes mellitus with hyperglycemia, without long-term current use of insulin  (HCC) (Primary)  - Patricia Stanton has currently uncontrolled symptomatic type 2 DM since  38 years of age,  with most recent A1c of 7.5 %. Recent labs reviewed. - I had a long discussion with her about the possible risk factors and  the pathology behind diabetes and its complications. -her diabetes is complicated by obesity, comorbid hypertension and hyperlipidemia, and she remains at a high risk for more acute and chronic complications which include CAD, CVA, CKD, retinopathy, and neuropathy. These are all discussed in detail with her.  - I discussed all available options of managing her diabetes including de-escalation of medications.  Patient is encouraged to switch to  unprocessed or minimally processed  complex starch, adequate protein intake (mainly plant source), minimal liquid fat, plenty of fruits, and vegetables. -  she is advised to stick to a routine mealtimes to eat 3 complete meals a day and snack only when necessary (to snack only to correct hypoglycemia BG <70 day time or <100 at night).   - she acknowledges that there is a room for improvement in her food and drink choices. - Further Specific Suggestion is made for her to  avoid simple carbohydrates  from her diet including Cakes, Sweet Desserts, Ice Cream, Soda (diet and regular), Sweet Tea, Candies, Chips, Cookies, Store Bought Juices, Alcohol ,  Artificial Sweeteners,  Coffee Creamer, and Sugar-free Products. This will help patient to have more stable blood glucose profile and potentially avoid unintended weight gain.   - she will be scheduled with Penny Crumpton, RDN, CDE for individualized diabetes education.  - I have approached her with the following individualized plan to manage  her diabetes and  patient agrees:   - In light of her apparent response to GLP-1 receptor agonist, advised her to continue Mounjaro 7.5 mg subcutaneously weekly.  She did not tolerate higher dose on a prior attempt.    Side effects and precautions discussed with her.  She is advised to continue metformin  500 mg p.o. twice daily.  She will be considered for SGLT2 inhibitors if she cannot control at next visit.  - Specific targets for  A1c;  LDL, HDL,  and Triglycerides were discussed with the patient.  2. Adrenal nodule-left -She will benefit from endocrine assessment of this left adrenal nodule which was documented to exist since at least 2017.  I discussed 24-hour urine collection with her. - Cortisol, urine, free; Future - Creatinine, urine, 24 hour; Future - Metanephrines, urine, 24 hour; Future - Metanephrines, urine, 24 hour  - In light of its reported growth between the last 2 measurements, she would also benefit from repeat assessment by adrenal dedicated CT scan with and without contrast.  3) Blood Pressure /Hypertension:  her blood pressure is  controlled to target.   she is advised to continue her current medications including lisinopril  2.5 mg p.o. daily with breakfast .   4) Lipids/Hyperlipidemia: Patient is not aware of her hyperlipidemia.  She is not on statins.  She did have recent labs with her PMD, advised to get a copy of her lipid panel.  If her LDL is  still above 100, she will be approached for low-dose statins.  She does have evidence of metabolic dysfunction associated steatohepatitis.     5)  Weight/Diet:  Body mass index is 42.17 kg/m.  -   clearly complicating her diabetes care.   she is  a candidate for weight loss. I discussed with her the fact that loss of 5 - 10% of her  current body weight will have the most impact on her diabetes management.    6) Chronic Care/Health Maintenance:  -she  is on ACEI medications and  is encouraged to initiate and continue to follow up with Ophthalmology, Dentist,  Podiatrist at least yearly or according to recommendations, and advised to consider smoking cessation.  I have recommended yearly flu vaccine and pneumonia vaccine at least every 5 years; moderate intensity exercise for up to 150 minutes weekly; and  sleep for 7- 9 hours a day.  - she is  advised to maintain close follow up with Jerel Gee, NP for primary care needs, as well as her other providers for optimal and coordinated care.   Thank you for involving me in the care of this pleasant patient.  I spent  60  minutes in the care of the patient today including review of labs from CMP, Lipids, Thyroid  Function, Hematology (current and previous including abstractions from other facilities); face-to-face time discussing  her blood glucose readings/logs, discussing hypoglycemia and hyperglycemia episodes and symptoms, medications doses, her options of short and long term treatment based on the latest standards of care / guidelines;  discussion about incorporating lifestyle medicine;  and documenting the encounter. Risk reduction counseling performed per USPSTF guidelines to reduce  obesity and cardiovascular risk factors.      Please refer to Patient Instructions for Blood Glucose Monitoring and Insulin /Medications Dosing Guide  in media tab for additional information. Please  also refer to  Patient Self Inventory in the Media  tab for reviewed  elements of pertinent patient history.  Patricia Stanton participated in the discussions, expressed understanding, and voiced agreement with  the above plans.  All questions were answered to her satisfaction. she is encouraged to contact clinic should she have any questions or concerns prior to her return visit.   Follow up plan: - Return in about 9 weeks (around 08/24/2024), or CT adrenals, for F/U with Pre-visit Labs.  Ranny Earl, MD [facility] [address] [place], [place] [address] Phone: [phone]  Fax: [phone]    06/22/2024, 11:27 AM  This note was partially dictated with voice recognition software. Similar sounding words can be transcribed inadequately or may not  be corrected upon review.

## 2024-07-05 ENCOUNTER — Ambulatory Visit (HOSPITAL_COMMUNITY): Admission: RE | Admit: 2024-07-05 | Source: Ambulatory Visit

## 2024-07-07 DIAGNOSIS — Z79899 Other long term (current) drug therapy: Secondary | ICD-10-CM | POA: Diagnosis not present

## 2024-07-13 ENCOUNTER — Ambulatory Visit: Admitting: Nurse Practitioner

## 2024-07-14 ENCOUNTER — Ambulatory Visit: Admitting: Nurse Practitioner

## 2024-07-21 ENCOUNTER — Other Ambulatory Visit: Payer: Self-pay | Admitting: "Endocrinology

## 2024-07-21 ENCOUNTER — Ambulatory Visit (HOSPITAL_COMMUNITY)
Admission: RE | Admit: 2024-07-21 | Discharge: 2024-07-21 | Disposition: A | Discharge status: Home / Self Care | Source: Ambulatory Visit | Attending: "Endocrinology | Admitting: "Endocrinology

## 2024-07-21 DIAGNOSIS — E279 Disorder of adrenal gland, unspecified: Secondary | ICD-10-CM | POA: Diagnosis present

## 2024-07-21 DIAGNOSIS — N1831 Chronic kidney disease, stage 3a: Secondary | ICD-10-CM | POA: Diagnosis present

## 2024-07-21 DIAGNOSIS — E782 Mixed hyperlipidemia: Secondary | ICD-10-CM

## 2024-07-21 DIAGNOSIS — R590 Localized enlarged lymph nodes: Secondary | ICD-10-CM | POA: Diagnosis not present

## 2024-07-21 DIAGNOSIS — Z9049 Acquired absence of other specified parts of digestive tract: Secondary | ICD-10-CM | POA: Diagnosis not present

## 2024-07-21 DIAGNOSIS — E1165 Type 2 diabetes mellitus with hyperglycemia: Secondary | ICD-10-CM

## 2024-07-21 DIAGNOSIS — I10 Essential (primary) hypertension: Secondary | ICD-10-CM

## 2024-07-21 MED ORDER — IOHEXOL 300 MG/ML  SOLN: 100.0000 mL | Freq: Once | INTRAMUSCULAR | Status: DC | PRN

## 2024-07-23 LAB — POCT I-STAT CREATININE: Creatinine, Ser: 0.5 mg/dL (ref 0.44–1.00)

## 2024-07-27 ENCOUNTER — Other Ambulatory Visit (HOSPITAL_COMMUNITY): Payer: Self-pay | Admitting: Neurological Surgery

## 2024-07-27 DIAGNOSIS — M5416 Radiculopathy, lumbar region: Secondary | ICD-10-CM

## 2024-07-27 DIAGNOSIS — M542 Cervicalgia: Secondary | ICD-10-CM

## 2024-08-01 ENCOUNTER — Ambulatory Visit (HOSPITAL_COMMUNITY)
Admission: RE | Admit: 2024-08-01 | Discharge: 2024-08-01 | Disposition: A | Discharge status: Home / Self Care | Source: Ambulatory Visit | Attending: Neurological Surgery | Admitting: Neurological Surgery

## 2024-08-01 DIAGNOSIS — M542 Cervicalgia: Secondary | ICD-10-CM | POA: Diagnosis present

## 2024-08-01 DIAGNOSIS — M50221 Other cervical disc displacement at C4-C5 level: Secondary | ICD-10-CM | POA: Diagnosis not present

## 2024-08-01 DIAGNOSIS — M50222 Other cervical disc displacement at C5-C6 level: Secondary | ICD-10-CM | POA: Diagnosis not present

## 2024-08-01 DIAGNOSIS — M47812 Spondylosis without myelopathy or radiculopathy, cervical region: Secondary | ICD-10-CM | POA: Diagnosis not present

## 2024-08-01 DIAGNOSIS — M4802 Spinal stenosis, cervical region: Secondary | ICD-10-CM | POA: Diagnosis not present

## 2024-08-01 DIAGNOSIS — M5416 Radiculopathy, lumbar region: Secondary | ICD-10-CM | POA: Diagnosis present

## 2024-08-03 ENCOUNTER — Ambulatory Visit: Admitting: General Surgery

## 2024-08-03 ENCOUNTER — Encounter: Payer: Self-pay | Admitting: General Surgery

## 2024-08-03 VITALS — BP 130/82 | HR 97 | Temp 98.9°F | Resp 18 | Ht 65.0 in | Wt 251.0 lb

## 2024-08-03 DIAGNOSIS — N611 Abscess of the breast and nipple: Secondary | ICD-10-CM

## 2024-08-03 NOTE — Patient Instructions (Addendum)
 CT a/p with left adrenal adenoma.  Left adrenal lipid-rich adenoma. No follow-up imaging recommended per CT-based adrenal incidentaloma criteria for lipid-rich adenoma. (Follow up with Dr. Lenis for further recommendations).   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 Joshua for further recommendations).  MRI Cervical spine- not read pending  Continue with attempted smoking cessation to prevent breast infections. Regular mammograms with your PCP/ GYN.

## 2024-08-04 NOTE — Progress Notes (Signed)
 [facility]  Doing well. No further drainage or packing of the left breast area. No new infections. She is healed from the secondary intention closure.  BP 130/82   Pulse 97   Temp 98.9 F (37.2 C) (Oral)   Resp 18   Ht 5' 5 (1.651 m)   Wt 251 lb (113.9 kg)   SpO2 95%   BMI 41.77 kg/m  Left breast incision healed by secondary intention, she has an area on the medial areola edge from about 10 o'clock to 12 o'clock where the indention from the secondary intention healed and epithelized creating an indention where the area healed. The wound is fully epithelized but the skin of the breast and the areola are not flush due to this divot   Patient s/p multiple procedures on her left breast for recurrent infections. She is finally healed and doing well. Discussed option of excising the scar and seeing if this could look better cosmetically given the indention and discussed risk of bleeding, infection, that she may not heal this.    The patient asked about her recent imaging that Dr. Lenis and Dr. Joshua ordered and asked me to tell her /explain the results she saw on MyChart.   CT a/p with left adrenal adenoma.  Left adrenal lipid-rich adenoma. No follow-up imaging recommended per CT-based adrenal incidentaloma criteria for lipid-rich adenoma. (Follow up with Dr. Lenis for further recommendations).   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 Joshua for further recommendations).  MRI Cervical spine- not read pending  Continue with attempted smoking cessation to prevent breast infections. Regular mammograms with your PCP/ GYN.   PRN Follow up   Manuelita Pander, MD [facility] [address] [place], [place] [address] [phone] (office)

## 2024-08-25 ENCOUNTER — Ambulatory Visit: Admitting: "Endocrinology

## 2024-08-30 ENCOUNTER — Ambulatory Visit: Admitting: "Endocrinology
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
## 2024-09-21 ENCOUNTER — Ambulatory Visit (HOSPITAL_COMMUNITY): Attending: Neurological Surgery

## 2024-09-21 ENCOUNTER — Other Ambulatory Visit: Payer: Self-pay

## 2024-09-21 DIAGNOSIS — R519 Headache, unspecified: Secondary | ICD-10-CM | POA: Insufficient documentation

## 2024-09-21 DIAGNOSIS — G8929 Other chronic pain: Secondary | ICD-10-CM | POA: Insufficient documentation

## 2024-09-21 DIAGNOSIS — R293 Abnormal posture: Secondary | ICD-10-CM | POA: Diagnosis present

## 2024-09-21 DIAGNOSIS — M436 Torticollis: Secondary | ICD-10-CM | POA: Insufficient documentation

## 2024-09-21 DIAGNOSIS — M542 Cervicalgia: Secondary | ICD-10-CM | POA: Insufficient documentation

## 2024-09-21 NOTE — Patient Instructions (Signed)

## 2024-09-22 ENCOUNTER — Ambulatory Visit (HOSPITAL_COMMUNITY)

## 2024-09-23 ENCOUNTER — Ambulatory Visit (HOSPITAL_COMMUNITY)

## 2024-09-23 DIAGNOSIS — M542 Cervicalgia: Secondary | ICD-10-CM

## 2024-09-23 DIAGNOSIS — R293 Abnormal posture: Secondary | ICD-10-CM

## 2024-09-23 DIAGNOSIS — M436 Torticollis: Secondary | ICD-10-CM

## 2024-09-23 DIAGNOSIS — G8929 Other chronic pain: Secondary | ICD-10-CM
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
## 2024-09-27 ENCOUNTER — Encounter (HOSPITAL_COMMUNITY): Payer: Self-pay

## 2024-09-27 NOTE — Therapy (Signed)
 PHYSICAL THERAPY DISCHARGE SUMMARY  Visits from Start of Care: 2  Current functional level related to goals / functional outcomes: unknown   Remaining deficits: unknown   Education / Equipment: HEP   Patient agrees to discharge. Patient goals were not met. Patient is being discharged due to the patient's request.  8:16 AM, 09/27/2024 Edell Mesenbrink Small Dealva Lafoy MPT [place] [facility] [place] [phone]

## 2024-09-28 ENCOUNTER — Ambulatory Visit (HOSPITAL_COMMUNITY)

## 2024-10-07 ENCOUNTER — Ambulatory Visit (HOSPITAL_COMMUNITY)

## 2024-10-14 ENCOUNTER — Ambulatory Visit (HOSPITAL_COMMUNITY)

## 2024-10-15 ENCOUNTER — Ambulatory Visit (HOSPITAL_COMMUNITY)

## 2024-10-21 ENCOUNTER — Ambulatory Visit (HOSPITAL_COMMUNITY)

## 2024-10-28 ENCOUNTER — Ambulatory Visit (HOSPITAL_COMMUNITY)

## 2024-11-04 ENCOUNTER — Ambulatory Visit (HOSPITAL_COMMUNITY)
